# Patient Record
Sex: Male | Born: 1960 | Race: Black or African American | Hispanic: No | Marital: Single | Smoking: Former smoker
Health system: Southern US, Community
[De-identification: ages and names within clinical notes are randomized; demographics above are authoritative.]

## PROBLEM LIST (undated history)

## (undated) DIAGNOSIS — J42 Unspecified chronic bronchitis: Secondary | ICD-10-CM

## (undated) DIAGNOSIS — I10 Essential (primary) hypertension: Secondary | ICD-10-CM

## (undated) DIAGNOSIS — C801 Malignant (primary) neoplasm, unspecified: Secondary | ICD-10-CM

---

## 2000-08-07 ENCOUNTER — Emergency Department (HOSPITAL_COMMUNITY): Admission: EM | Admit: 2000-08-07 | Discharge: 2000-08-07 | Payer: Self-pay | Admitting: Emergency Medicine

## 2000-08-07 ENCOUNTER — Encounter: Payer: Self-pay | Admitting: Emergency Medicine

## 2000-11-12 ENCOUNTER — Encounter: Payer: Self-pay | Admitting: Emergency Medicine

## 2000-11-12 ENCOUNTER — Emergency Department (HOSPITAL_COMMUNITY): Admission: EM | Admit: 2000-11-12 | Discharge: 2000-11-12 | Payer: Self-pay | Admitting: Emergency Medicine

## 2003-02-23 ENCOUNTER — Emergency Department (HOSPITAL_COMMUNITY): Admission: EM | Admit: 2003-02-23 | Discharge: 2003-02-23 | Payer: Self-pay | Admitting: *Deleted

## 2003-08-02 ENCOUNTER — Emergency Department (HOSPITAL_COMMUNITY): Admission: EM | Admit: 2003-08-02 | Discharge: 2003-08-02 | Payer: Self-pay | Admitting: Emergency Medicine

## 2003-08-09 ENCOUNTER — Emergency Department (HOSPITAL_COMMUNITY): Admission: EM | Admit: 2003-08-09 | Discharge: 2003-08-09 | Payer: Self-pay | Admitting: Emergency Medicine

## 2005-02-05 ENCOUNTER — Emergency Department (HOSPITAL_COMMUNITY): Admission: EM | Admit: 2005-02-05 | Discharge: 2005-02-06 | Payer: Self-pay | Admitting: Emergency Medicine

## 2006-08-11 ENCOUNTER — Emergency Department (HOSPITAL_COMMUNITY): Admission: EM | Admit: 2006-08-11 | Discharge: 2006-08-11 | Payer: Self-pay | Admitting: Emergency Medicine

## 2006-08-16 ENCOUNTER — Emergency Department (HOSPITAL_COMMUNITY): Admission: EM | Admit: 2006-08-16 | Discharge: 2006-08-17 | Payer: Self-pay | Admitting: Emergency Medicine

## 2006-08-17 ENCOUNTER — Emergency Department (HOSPITAL_COMMUNITY): Admission: EM | Admit: 2006-08-17 | Discharge: 2006-08-17 | Payer: Self-pay | Admitting: Emergency Medicine

## 2008-05-20 ENCOUNTER — Emergency Department (HOSPITAL_COMMUNITY): Admission: EM | Admit: 2008-05-20 | Discharge: 2008-05-21 | Payer: Self-pay | Admitting: Emergency Medicine

## 2008-07-22 ENCOUNTER — Emergency Department (HOSPITAL_COMMUNITY): Admission: EM | Admit: 2008-07-22 | Discharge: 2008-07-22 | Payer: Self-pay | Admitting: Emergency Medicine

## 2009-10-19 ENCOUNTER — Emergency Department (HOSPITAL_COMMUNITY): Admission: EM | Admit: 2009-10-19 | Discharge: 2009-10-19 | Payer: Self-pay | Admitting: Emergency Medicine

## 2009-11-04 ENCOUNTER — Emergency Department (HOSPITAL_COMMUNITY): Admission: EM | Admit: 2009-11-04 | Discharge: 2009-11-04 | Payer: Self-pay | Admitting: Emergency Medicine

## 2010-05-08 ENCOUNTER — Emergency Department (HOSPITAL_COMMUNITY): Admission: EM | Admit: 2010-05-08 | Discharge: 2010-05-08 | Payer: Self-pay | Admitting: Emergency Medicine

## 2010-08-12 ENCOUNTER — Emergency Department (HOSPITAL_COMMUNITY): Admission: EM | Admit: 2010-08-12 | Discharge: 2010-08-12 | Payer: Self-pay | Admitting: Emergency Medicine

## 2011-03-11 LAB — URINALYSIS, ROUTINE W REFLEX MICROSCOPIC
Glucose, UA: NEGATIVE mg/dL
Ketones, ur: NEGATIVE mg/dL
Nitrite: NEGATIVE
Protein, ur: NEGATIVE mg/dL
pH: 5.5 (ref 5.0–8.0)

## 2011-07-03 ENCOUNTER — Emergency Department (HOSPITAL_COMMUNITY)
Admission: EM | Admit: 2011-07-03 | Discharge: 2011-07-03 | Disposition: A | Discharge status: Home / Self Care | Payer: Medicaid Other | Attending: Emergency Medicine | Admitting: Emergency Medicine

## 2011-07-03 DIAGNOSIS — F3289 Other specified depressive episodes: Secondary | ICD-10-CM | POA: Insufficient documentation

## 2011-07-03 DIAGNOSIS — F329 Major depressive disorder, single episode, unspecified: Secondary | ICD-10-CM | POA: Insufficient documentation

## 2011-07-03 DIAGNOSIS — B86 Scabies: Secondary | ICD-10-CM | POA: Insufficient documentation

## 2013-02-10 ENCOUNTER — Emergency Department (HOSPITAL_COMMUNITY)
Admission: EM | Admit: 2013-02-10 | Discharge: 2013-02-10 | Disposition: A | Discharge status: Home / Self Care | Payer: Medicaid Other | Attending: Emergency Medicine | Admitting: Emergency Medicine

## 2013-02-10 ENCOUNTER — Emergency Department (HOSPITAL_COMMUNITY): Payer: Medicaid Other

## 2013-02-10 ENCOUNTER — Encounter (HOSPITAL_COMMUNITY): Payer: Self-pay | Admitting: Emergency Medicine

## 2013-02-10 DIAGNOSIS — J209 Acute bronchitis, unspecified: Secondary | ICD-10-CM | POA: Insufficient documentation

## 2013-02-10 DIAGNOSIS — R6883 Chills (without fever): Secondary | ICD-10-CM | POA: Insufficient documentation

## 2013-02-10 MED ORDER — ALBUTEROL SULFATE HFA 108 (90 BASE) MCG/ACT IN AERS: 2.0000 | INHALATION_SPRAY | Freq: Four times a day (QID) | RESPIRATORY_TRACT | Status: DC | PRN

## 2013-02-10 MED ORDER — AZITHROMYCIN 250 MG PO TABS: 250.0000 mg | ORAL_TABLET | Freq: Every day | ORAL | Status: DC

## 2013-02-10 NOTE — ED Notes (Signed)
Per EMS: pt from home c/o fever with aches and chills x 3 days; pt sts pain with inspiration and cough

## 2013-02-10 NOTE — ED Provider Notes (Signed)
History     CSN: [id number]  Arrival date & time 02/10/13  1035   First MD Initiated Contact with Patient 02/10/13 1047      Chief Complaint  Patient presents with  . Chills  . Cough    (Consider location/radiation/quality/duration/timing/severity/associated sxs/prior treatment) HPI Comments: Patient presents today with a chief complaint of productive cough and chills for the past 3 days.  He thinks that he may have had a fever, but has not checked his temperature.  He denies SOB.  He does not have pain with inspiration, but does have some left lower ribcage pain when he coughs very hard.  He denies hemoptysis.  Denies any history of DVT, PE, or Cancer.  He denies prolonged travel or surgeries in the past 4 weeks.  He currently smokes occasionally.  No known history of COPD.    Patient is a 52 y.o. male presenting with cough. The history is provided by the patient.  Cough Cough characteristics:  Productive Sputum characteristics:  Yellow Progression:  Worsening Smoker: yes   Relieved by:  None tried Ineffective treatments:  None tried Associated symptoms: chills   Associated symptoms: no chest pain, no ear fullness, no ear pain, no myalgias, no shortness of breath, no sinus congestion, no sore throat and no wheezing     History reviewed. No pertinent past medical history.  History reviewed. No pertinent past surgical history.  History reviewed. No pertinent family history.  History  Substance Use Topics  . Smoking status: Not on file  . Smokeless tobacco: Not on file  . Alcohol Use: Not on file      Review of Systems  Constitutional: Positive for chills.       Subjective fever  HENT: Negative for ear pain and sore throat.   Respiratory: Positive for cough. Negative for chest tightness, shortness of breath and wheezing.   Cardiovascular: Negative for chest pain, palpitations and leg swelling.  Musculoskeletal: Negative for myalgias.  All other systems reviewed and  are negative.    Allergies  Review of patient's allergies indicates no known allergies.  Home Medications  No current outpatient prescriptions on file.  BP 130/87  Pulse 93  Temp(Src) 98.2 F (36.8 C) (Oral)  Resp 16  SpO2 98%  Physical Exam  Nursing note and vitals reviewed. Constitutional: He appears well-developed and well-nourished. No distress.  HENT:  Head: Normocephalic and atraumatic.  Right Ear: Tympanic membrane and ear canal normal.  Left Ear: Tympanic membrane and ear canal normal.  Nose: Mucosal edema present. Right sinus exhibits no maxillary sinus tenderness and no frontal sinus tenderness. Left sinus exhibits no maxillary sinus tenderness and no frontal sinus tenderness.  Mouth/Throat: Uvula is midline, oropharynx is clear and moist and mucous membranes are normal.  Neck: Normal range of motion. Neck supple.  Cardiovascular: Normal rate, regular rhythm and normal heart sounds.   Pulmonary/Chest: Effort normal and breath sounds normal. No respiratory distress. He has no wheezes. He has no rales.  Neurological: He is alert.  Skin: Skin is warm and dry. He is not diaphoretic. No erythema.  Psychiatric: He has a normal mood and affect.    ED Course  Procedures (including critical care time)  Labs Reviewed - No data to display Dg Chest 2 View  02/10/2013  *RADIOLOGY REPORT*  Clinical Data: Cough, chills  CHEST - 2 VIEW  Comparison: Chest x-ray of 10/19/2009  Findings: No active infiltrate or effusion is seen.  There is some peribronchial thickening which may indicate  bronchitis, possibly chronic.  The heart is within upper limits of normal and stable. There are degenerative changes in the lower thoracic spine.  IMPRESSION: No pneumonia.  Question bronchitis.   Original Report Authenticated By: Dwyane Dee, M.D.      No diagnosis found.    MDM  Patient presenting with chills and productive cough.  CXR showing no evidence of Pneumonia, but possible Bronchitis.   Patient does smoke occasionally.  Patient is not hypoxic.  Therefore, feel that the patient can be discharged home.  Return precautions discussed.        Pascal Lux [place], PA-C 02/10/13 [phone]

## 2013-02-10 NOTE — ED Provider Notes (Signed)
Medical screening examination/treatment/procedure(s) were performed by non-physician practitioner and as supervising physician I was immediately available for consultation/collaboration.  Derwood Kaplan, MD 02/10/13 1807

## 2013-02-10 NOTE — ED Notes (Signed)
Patient transported to X-ray 

## 2013-08-02 ENCOUNTER — Encounter (HOSPITAL_COMMUNITY): Payer: Self-pay

## 2013-08-02 ENCOUNTER — Emergency Department (HOSPITAL_COMMUNITY)
Admission: EM | Admit: 2013-08-02 | Discharge: 2013-08-02 | Disposition: A | Discharge status: Home / Self Care | Payer: Medicaid Other | Attending: Emergency Medicine | Admitting: Emergency Medicine

## 2013-08-02 DIAGNOSIS — T63461A Toxic effect of venom of wasps, accidental (unintentional), initial encounter: Secondary | ICD-10-CM | POA: Insufficient documentation

## 2013-08-02 DIAGNOSIS — T6391XA Toxic effect of contact with unspecified venomous animal, accidental (unintentional), initial encounter: Secondary | ICD-10-CM | POA: Insufficient documentation

## 2013-08-02 DIAGNOSIS — W57XXXA Bitten or stung by nonvenomous insect and other nonvenomous arthropods, initial encounter: Secondary | ICD-10-CM

## 2013-08-02 DIAGNOSIS — F17200 Nicotine dependence, unspecified, uncomplicated: Secondary | ICD-10-CM | POA: Insufficient documentation

## 2013-08-02 DIAGNOSIS — Z8709 Personal history of other diseases of the respiratory system: Secondary | ICD-10-CM | POA: Insufficient documentation

## 2013-08-02 DIAGNOSIS — Y929 Unspecified place or not applicable: Secondary | ICD-10-CM | POA: Insufficient documentation

## 2013-08-02 DIAGNOSIS — Y9389 Activity, other specified: Secondary | ICD-10-CM | POA: Insufficient documentation

## 2013-08-02 HISTORY — DX: Unspecified chronic bronchitis: J42

## 2013-08-02 MED ORDER — CEPHALEXIN 500 MG PO CAPS: 500.0000 mg | ORAL_CAPSULE | Freq: Four times a day (QID) | ORAL | Status: DC

## 2013-08-02 NOTE — ED Provider Notes (Signed)
CSN: [id number]     Arrival date & time 08/02/13  1652 History     First MD Initiated Contact with Patient 08/02/13 1945     Chief Complaint  Patient presents with  . Insect Bite   (Consider location/radiation/quality/duration/timing/severity/associated sxs/prior Treatment) HPI Comments: Patient brought in today by EMS due to swelling of his right hand.  Patient reports that he was stung by a bee three days ago.  He states that he actually saw the bee and felt the bee sting him.  He reports that today he started noticing swelling of his right hand.  Hand is also painful.  He was given Benadryl IV by EMS en route to the ED, but does not feel that there is any improvement in swelling at this time.  He denies numbness or tingling.  He denies fever or chills.  Denies swelling of the lips, tongue, or throat.  Denies shortness of breath.  He denies history of DM.    The history is provided by the patient.    Past Medical History  Diagnosis Date  . Bronchitis, chronic    History reviewed. No pertinent past surgical history. No family history on file. History  Substance Use Topics  . Smoking status: Current Some Day Smoker  . Smokeless tobacco: Not on file  . Alcohol Use: Yes    Review of Systems  Musculoskeletal:       Pain and swelling of the right hand  All other systems reviewed and are negative.    Allergies  Review of patient's allergies indicates no known allergies.  Home Medications   Current Outpatient Rx  Name  Route  Sig  Dispense  Refill  . albuterol (PROVENTIL HFA;VENTOLIN HFA) 108 (90 BASE) MCG/ACT inhaler   Inhalation   Inhale 2 puffs into the lungs every 6 (six) hours as needed for wheezing.   1 Inhaler   2    BP 161/110  Pulse 86  Temp(Src) 98.7 F (37.1 C) (Oral)  Resp 16  SpO2 97% Physical Exam  Nursing note and vitals reviewed. Constitutional: He appears well-developed and well-nourished. No distress.  HENT:  Head: Normocephalic and atraumatic.   Cardiovascular: Normal rate, regular rhythm and normal heart sounds.   Pulses:      Radial pulses are 2+ on the right side, and 2+ on the left side.  Pulmonary/Chest: Effort normal and breath sounds normal.  Musculoskeletal:  Swelling of the dorsal aspect of the right hand.  No erythema visualized.  No warmth of the hand.  Neurological: He is alert.  Distal sensation of the right hand intact  Skin: Skin is warm and dry. He is not diaphoretic.  No abscess present  Psychiatric: He has a normal mood and affect.    ED Course   Procedures (including critical care time)  Labs Reviewed - No data to display No results found. No diagnosis found.  Patient also evaluated by Dr. Denton Lank. MDM  Patient presenting with swelling of his right hand.  He was stung by a bee three days ago.  On exam obvious swelling of the right hand.  No obvious erythema or warmth noted.  It is possible that the patient is having a delayed reaction to the bee sting.  Patient instructed to continue to use Benadryl.  Patient also started on Keflex to treat for possible Cellulitis.  Patient instructed to follow up for recheck in 48 hours.  Return precautions given.  Patient also evaluated by Dr. Denton Lank.  Pascal Lux [place],  PA-C 08/03/13 1516

## 2013-08-02 NOTE — ED Notes (Signed)
Per EMS pt c/o bee sting to rt hand 3 days ago, now has swelling; benadryl 50mg  IV given enroute, denies LOC

## 2013-08-03 NOTE — ED Provider Notes (Signed)
Medical screening examination/treatment/procedure(s) were conducted as a shared visit with non-physician practitioner(s) and myself.  I personally evaluated the patient during the encounter Pt c/o insect bite to dorsum hand 3 days ago. Now swollen and mildly painful. ?early cellulitis on exam.   Suzi Roots, MD 08/03/13 2132

## 2015-07-15 ENCOUNTER — Emergency Department (HOSPITAL_COMMUNITY)
Admission: EM | Admit: 2015-07-15 | Discharge: 2015-07-15 | Disposition: A | Discharge status: Home / Self Care | Payer: Medicaid Other | Attending: Emergency Medicine | Admitting: Emergency Medicine

## 2015-07-15 ENCOUNTER — Emergency Department (HOSPITAL_COMMUNITY): Payer: Medicaid Other

## 2015-07-15 ENCOUNTER — Encounter (HOSPITAL_COMMUNITY): Payer: Self-pay | Admitting: Physical Medicine and Rehabilitation

## 2015-07-15 DIAGNOSIS — R55 Syncope and collapse: Secondary | ICD-10-CM | POA: Insufficient documentation

## 2015-07-15 DIAGNOSIS — I10 Essential (primary) hypertension: Secondary | ICD-10-CM | POA: Insufficient documentation

## 2015-07-15 DIAGNOSIS — Z79899 Other long term (current) drug therapy: Secondary | ICD-10-CM | POA: Insufficient documentation

## 2015-07-15 DIAGNOSIS — Z8709 Personal history of other diseases of the respiratory system: Secondary | ICD-10-CM | POA: Diagnosis not present

## 2015-07-15 DIAGNOSIS — Z720 Tobacco use: Secondary | ICD-10-CM | POA: Diagnosis not present

## 2015-07-15 HISTORY — DX: Essential (primary) hypertension: I10

## 2015-07-15 LAB — COMPREHENSIVE METABOLIC PANEL
ALK PHOS: 89 U/L (ref 38–126)
ALT: 42 U/L (ref 17–63)
ANION GAP: 10 (ref 5–15)
AST: 49 U/L — ABNORMAL HIGH (ref 15–41)
Albumin: 4 g/dL (ref 3.5–5.0)
BILIRUBIN TOTAL: 0.9 mg/dL (ref 0.3–1.2)
BUN: 7 mg/dL (ref 6–20)
CHLORIDE: 103 mmol/L (ref 101–111)
CO2: 22 mmol/L (ref 22–32)
CREATININE: 0.8 mg/dL (ref 0.61–1.24)
Calcium: 8.6 mg/dL — ABNORMAL LOW (ref 8.9–10.3)
GLUCOSE: 85 mg/dL (ref 65–99)
POTASSIUM: 3.4 mmol/L — AB (ref 3.5–5.1)
Sodium: 135 mmol/L (ref 135–145)
Total Protein: 7.2 g/dL (ref 6.5–8.1)

## 2015-07-15 LAB — I-STAT CG4 LACTIC ACID, ED: LACTIC ACID, VENOUS: 1.8 mmol/L (ref 0.5–2.0)

## 2015-07-15 LAB — CBC WITH DIFFERENTIAL/PLATELET
BASOS ABS: 0 10*3/uL (ref 0.0–0.1)
Basophils Relative: 0 % (ref 0–1)
Eosinophils Absolute: 0.3 10*3/uL (ref 0.0–0.7)
Eosinophils Relative: 4 % (ref 0–5)
HEMATOCRIT: 36.8 % — AB (ref 39.0–52.0)
HEMOGLOBIN: 12.2 g/dL — AB (ref 13.0–17.0)
LYMPHS PCT: 36 % (ref 12–46)
Lymphs Abs: 2.7 10*3/uL (ref 0.7–4.0)
MCH: 30.6 pg (ref 26.0–34.0)
MCHC: 33.2 g/dL (ref 30.0–36.0)
MCV: 92.2 fL (ref 78.0–100.0)
MONO ABS: 0.5 10*3/uL (ref 0.1–1.0)
Monocytes Relative: 7 % (ref 3–12)
NEUTROS ABS: 4 10*3/uL (ref 1.7–7.7)
NEUTROS PCT: 53 % (ref 43–77)
Platelets: 209 10*3/uL (ref 150–400)
RBC: 3.99 MIL/uL — ABNORMAL LOW (ref 4.22–5.81)
RDW: 12.9 % (ref 11.5–15.5)
WBC: 7.5 10*3/uL (ref 4.0–10.5)

## 2015-07-15 LAB — I-STAT TROPONIN, ED: TROPONIN I, POC: 0 ng/mL (ref 0.00–0.08)

## 2015-07-15 LAB — CBG MONITORING, ED: Glucose-Capillary: 92 mg/dL (ref 65–99)
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
## 2015-07-15 NOTE — ED Provider Notes (Addendum)
CSN: [id number]     Arrival date & time 07/15/15  1057 History   First MD Initiated Contact with Patient 07/15/15 1109     Chief Complaint  Patient presents with  . Loss of Consciousness     (Consider location/radiation/quality/duration/timing/severity/associated sxs/prior Treatment) Patient is a 54 y.o. male presenting with syncope. The history is provided by the patient.  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 His vision went black he fell to his knee and then it lost consciousness for a very short time Witnessed: yes   Relieved by:  None tried Worsened by:  Nothing tried Ineffective treatments:  None tried Associated symptoms: no anxiety, no chest pain, no confusion, no diaphoresis, no difficulty breathing, no dizziness, no focal weakness, no headaches, no malaise/fatigue, no nausea, no palpitations, no shortness of breath, no vomiting and no weakness   Risk factors comment:  States he drinks a beer about every hour. Occasional tobacco use but denies any drug use. No heart history but does have a history of hypertension. Not currently taking medication.   Past Medical History  Diagnosis Date  . Bronchitis, chronic   . Hypertension    History reviewed. No pertinent past surgical history. History reviewed. No pertinent family history. History  Substance Use Topics  . Smoking status: Current Some Day Smoker    Types: Cigarettes  . Smokeless tobacco: Not on file  . Alcohol Use: Yes    Review of Systems  Constitutional: Negative for malaise/fatigue and diaphoresis.  Respiratory: Negative for shortness of breath.   Cardiovascular: Positive for syncope. Negative for chest pain and palpitations.  Gastrointestinal: Negative for nausea and vomiting.   Neurological: Negative for dizziness, focal weakness, weakness and headaches.  Psychiatric/Behavioral: Negative for confusion.  All other systems reviewed and are negative.     Allergies  Review of patient's allergies indicates no known allergies.  Home Medications   Prior to Admission medications   Medication Sig Start Date End Date Taking? Authorizing Provider  albuterol (PROVENTIL HFA;VENTOLIN HFA) 108 (90 BASE) MCG/ACT inhaler Inhale 2 puffs into the lungs every 6 (six) hours as needed for wheezing. 02/10/13   Heather Laisure, PA-C  cephALEXin (KEFLEX) 500 MG capsule Take 1 capsule (500 mg total) by mouth 4 (four) times daily. 08/02/13   Heather Laisure, PA-C   BP 114/78 mmHg  Pulse 85  Temp(Src) 98.1 F (36.7 C) (Oral)  Resp 20  SpO2 99% Physical Exam  Constitutional: He is oriented to person, place, and time. He appears well-developed and well-nourished. No distress.  HENT:  Head: Normocephalic and atraumatic.  Mouth/Throat: Oropharynx is clear and moist.  Eyes: Conjunctivae and EOM are normal. Pupils are equal, round, and reactive to light.  Neck: Normal range of motion. Neck supple.  Cardiovascular: Normal rate, regular rhythm and intact distal pulses.   No murmur heard. Pulmonary/Chest: Effort normal and breath sounds normal. No respiratory distress. He has no wheezes. He has no rales.  Abdominal: Soft. He exhibits no distension. There is no tenderness. There is no rebound and no guarding.  Musculoskeletal: Normal range of motion. He exhibits no edema or tenderness.  Neurological: He is alert and oriented to person, place, and time.  Skin: Skin is warm and dry. No rash noted. No erythema.  Psychiatric: He has  a normal mood and affect. His behavior is normal.  Nursing note and vitals reviewed.   ED Course  Procedures (including critical care time) Labs Review Labs Reviewed  CBC WITH DIFFERENTIAL/PLATELET - Abnormal; Notable for the following:    RBC 3.99 (*)     Hemoglobin 12.2 (*)    HCT 36.8 (*)    All other components within normal limits  COMPREHENSIVE METABOLIC PANEL - Abnormal; Notable for the following:    Potassium 3.4 (*)    Calcium 8.6 (*)    AST 49 (*)    All other components within normal limits  I-STAT TROPOININ, ED  I-STAT CG4 LACTIC ACID, ED  CBG MONITORING, ED    Imaging Review Dg Chest 2 View  07/15/2015   CLINICAL DATA:  Recent syncopal episode  EXAM: CHEST - 2 VIEW  COMPARISON:  02/10/2013  FINDINGS: The heart size and mediastinal contours are within normal limits. Both lungs are clear. The visualized skeletal structures are unremarkable.  IMPRESSION: No acute abnormality noted.   Electronically Signed   By: Inez Catalina M.D.   On: 07/15/2015 13:12     EKG Interpretation   Date/Time:  Saturday July 15 2015 11:19:33 EDT Ventricular Rate:  82 PR Interval:  174 QRS Duration: 96 QT Interval:  400 QTC Calculation: 467 R Axis:   57 Text Interpretation:  Sinus rhythm Ventricular premature complex Consider  left atrial enlargement Abnormal R-wave progression, early transition  Nonspecific T abnrm, anterolateral leads No previous tracing Confirmed by  Maryan Rued  MD, Loree Fee ([id number]) on 07/15/2015 11:23:01 AM      MDM   Final diagnoses:  Syncope, unspecified syncope type    Patient with a history of sudden onset syncope today while outside at work. He did have prodromal symptoms of his vision going dark and knowing he was given a pass out. He denied any palpitations, chest pain or shortness of breath. He normally works outside and states he drinks water regularly also he drinks alcohol regularly. Between a 6 and 12 pack a day and he has been drinking this morning. He currently feels normal he denies any chest pain or shortness of breath.  He denies feeling ill and states that currently he feels his normal self. Patient has had one episode of syncope in the past about 4-5 years ago that was similar in nature. He denies any heart  issues but does admit to a history of hypertension but he is not taking any medication at this time.  Patient has a normal exam and currently has blood pressure is normal at 114/78 with a normal pulse. He has no injury from the syncope and no evidence of head injury. EKG does show early R-wave progression as well as nonspecific T-wave abnormalities without prior to compare.  It could be that patient is dehydrated from working outside as well as having regular alcohol use could also cause dehydration. Lower suspicion for cardiac cause today for his syncope. Also patient's blood pressure is in the low 100s so this could be orthostatic as he had been standing for a while and started to get hot prior to his syncope.  CBC, Chem-8, troponin, chest x-ray pending  1:17 PM Labs and imaging without acute concerns.  Mild AST elevation which is most likely related to his alcohol consumption.  BP and HR wnl. Feel most likely EKG changes are chronic as pt has not had any CP, SOB or sudden syncope concerning for cardiac disease.  Blanchie Dessert, MD 07/15/15 1320  Blanchie Dessert, MD 07/15/15 1355
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
## 2019-02-14 ENCOUNTER — Emergency Department (HOSPITAL_COMMUNITY): Payer: Medicaid Other

## 2019-02-14 ENCOUNTER — Emergency Department (HOSPITAL_COMMUNITY)
Admission: EM | Admit: 2019-02-14 | Discharge: 2019-02-14 | Disposition: A | Discharge status: Home / Self Care | Payer: Medicaid Other | Attending: Emergency Medicine | Admitting: Emergency Medicine

## 2019-02-14 ENCOUNTER — Encounter (HOSPITAL_COMMUNITY): Payer: Self-pay | Admitting: Emergency Medicine

## 2019-02-14 ENCOUNTER — Other Ambulatory Visit: Payer: Self-pay

## 2019-02-14 DIAGNOSIS — M542 Cervicalgia: Secondary | ICD-10-CM | POA: Insufficient documentation

## 2019-02-14 DIAGNOSIS — I10 Essential (primary) hypertension: Secondary | ICD-10-CM | POA: Diagnosis not present

## 2019-02-14 DIAGNOSIS — Z7982 Long term (current) use of aspirin: Secondary | ICD-10-CM | POA: Diagnosis not present

## 2019-02-14 DIAGNOSIS — R918 Other nonspecific abnormal finding of lung field: Secondary | ICD-10-CM | POA: Diagnosis not present

## 2019-02-14 DIAGNOSIS — F17200 Nicotine dependence, unspecified, uncomplicated: Secondary | ICD-10-CM | POA: Insufficient documentation

## 2019-02-14 LAB — BASIC METABOLIC PANEL
ANION GAP: 12 (ref 5–15)
BUN: 9 mg/dL (ref 6–20)
CO2: 26 mmol/L (ref 22–32)
Calcium: 9.3 mg/dL (ref 8.9–10.3)
Chloride: 91 mmol/L — ABNORMAL LOW (ref 98–111)
Creatinine, Ser: 0.76 mg/dL (ref 0.61–1.24)
GFR calc Af Amer: >60 mL/min (ref >60)
GFR calc non Af Amer: >60 mL/min (ref >60)
Glucose, Bld: 102 mg/dL — ABNORMAL HIGH (ref 70–99)
POTASSIUM: 3.7 mmol/L (ref 3.5–5.1)
Sodium: 129 mmol/L — ABNORMAL LOW (ref 135–145)

## 2019-02-14 LAB — CBC
HCT: 34.4 % — ABNORMAL LOW (ref 39.0–52.0)
Hemoglobin: 10.8 g/dL — ABNORMAL LOW (ref 13.0–17.0)
MCH: 29 pg (ref 26.0–34.0)
MCHC: 31.4 g/dL (ref 30.0–36.0)
MCV: 92.5 fL (ref 80.0–100.0)
PLATELETS: 426 10*3/uL — AB (ref 150–400)
RBC: 3.72 MIL/uL — ABNORMAL LOW (ref 4.22–5.81)
RDW: 14 % (ref 11.5–15.5)
WBC: 12.6 10*3/uL — AB (ref 4.0–10.5)
nRBC: 0 % (ref 0.0–0.2)

## 2019-02-14 MED ORDER — TRAMADOL HCL 50 MG PO TABS: 50.0000 mg | ORAL_TABLET | Freq: Four times a day (QID) | ORAL | 0 refills | Status: DC | PRN

## 2019-02-14 MED ORDER — SODIUM CHLORIDE (PF) 0.9 % IJ SOLN
INTRAMUSCULAR | Status: AC
  Filled 2019-02-14: qty 50

## 2019-02-14 MED ORDER — CYCLOBENZAPRINE HCL 10 MG PO TABS: 10.0000 mg | ORAL_TABLET | Freq: Two times a day (BID) | ORAL | 0 refills | Status: DC | PRN

## 2019-02-14 MED ORDER — CYCLOBENZAPRINE HCL 10 MG PO TABS
10.0000 mg | ORAL_TABLET | Freq: Once | ORAL | Status: AC
  Administered 2019-02-14: 10 mg via ORAL
  Filled 2019-02-14: qty 1

## 2019-02-14 MED ORDER — DEXAMETHASONE SODIUM PHOSPHATE 10 MG/ML IJ SOLN
10.0000 mg | Freq: Once | INTRAMUSCULAR | Status: AC
  Administered 2019-02-14: 10 mg via INTRAVENOUS
  Filled 2019-02-14: qty 1

## 2019-02-14 MED ORDER — KETOROLAC TROMETHAMINE 15 MG/ML IJ SOLN
15.0000 mg | Freq: Once | INTRAMUSCULAR | Status: AC
  Administered 2019-02-14: 15 mg via INTRAVENOUS
  Filled 2019-02-14: qty 1

## 2019-02-14 MED ORDER — IOHEXOL 300 MG/ML  SOLN
75.0000 mL | Freq: Once | INTRAMUSCULAR | Status: AC | PRN
  Administered 2019-02-14: 75 mL via INTRAVENOUS

## 2019-02-14 NOTE — ED Provider Notes (Signed)
[facility] DEPT Provider Note   CSN: [id number] Arrival date & time: 02/14/19  1402    History   Chief Complaint Chief Complaint  Patient presents with  . Neck Pain    HPI ENGLISH Willie Schmidt is a 58 y.o. male with hx of chronic bronchitis and every day smoker who presents to the ED with neck pain. Patient reports the pain started a month ago and is sharp on the right side. Patient reports taking ibuprofen without relief. Patient does not recall any injury to the area. The pain increases with movement of the head to the right. Patient also states "while I am here can you treat my cough and congestion?" I ask about the cough and patient reports it has been persistent for over a month and he has taken all sorts of OTC medications without relief.      HPI  Past Medical History:  Diagnosis Date  . Bronchitis, chronic ([place])   . Hypertension     There are no active problems to display for this patient.   History reviewed. No pertinent surgical history.      Home Medications    Prior to Admission medications   Medication Sig Start Date End Date Taking? Authorizing Provider  albuterol (PROVENTIL HFA;VENTOLIN HFA) 108 (90 BASE) MCG/ACT inhaler Inhale 2 puffs into the lungs every 6 (six) hours as needed for wheezing. 02/10/13   Hyman Bible, PA-C  aspirin 81 MG tablet Take 81 mg by mouth daily.    [provider]  cephALEXin (KEFLEX) 500 MG capsule Take 1 capsule (500 mg total) by mouth 4 (four) times daily. Patient not taking: Reported on 07/15/2015 08/02/13   Hyman Bible, PA-C  cyclobenzaprine (FLEXERIL) 10 MG tablet Take 1 tablet (10 mg total) by mouth 2 (two) times daily as needed for muscle spasms. 02/14/19   Ashley Murrain, NP  traMADol (ULTRAM) 50 MG tablet Take 1 tablet (50 mg total) by mouth every 6 (six) hours as needed. 02/14/19   Ashley Murrain, NP    Family History No family history on file.  Social History Social History    Tobacco Use  . Smoking status: Current Some Day Smoker    Types: Cigarettes  Substance Use Topics  . Alcohol use: Yes  . Drug use: No     Allergies   Patient has no known allergies.   Review of Systems Review of Systems  Constitutional: Negative for chills and fever.  HENT: Positive for congestion.   Eyes: Negative for visual disturbance.  Respiratory: Positive for cough and shortness of breath.   Cardiovascular: Negative for chest pain.  Gastrointestinal: Negative for abdominal pain, nausea and vomiting.  Musculoskeletal: Positive for neck pain.  Skin: Negative for wound.  Neurological: Negative for weakness and headaches.  Psychiatric/Behavioral: Negative for confusion.     Physical Exam Updated Vital Signs BP (!) 148/99   Pulse 96   Temp 98.6 F (37 C) (Oral)   Resp 18   SpO2 100%   Physical Exam Vitals signs and nursing note reviewed.  Constitutional:      General: He is not in acute distress.    Appearance: He is well-developed.  HENT:     Head: Normocephalic and atraumatic.     Right Ear: Tympanic membrane normal.     Left Ear: Tympanic membrane normal.     Nose: Nose normal.     Mouth/Throat:     Mouth: Mucous membranes are moist.  Eyes:  Extraocular Movements: Extraocular movements intact.     Conjunctiva/sclera: Conjunctivae normal.  Neck:     Musculoskeletal: Neck supple. Decreased range of motion: due to pain. Pain with movement, torticollis, spinous process tenderness and muscular tenderness present. No neck rigidity.  Cardiovascular:     Rate and Rhythm: Normal rate and regular rhythm.  Pulmonary:     Effort: No respiratory distress.     Breath sounds: Decreased air movement present. Examination of the left-upper field reveals decreased breath sounds. Decreased breath sounds present. Wheezes: occasional.  Musculoskeletal: Normal range of motion.  Skin:    General: Skin is warm and dry.  Neurological:     Mental Status: He is alert and  oriented to person, place, and time.     Cranial Nerves: No cranial nerve deficit.  Psychiatric:        Mood and Affect: Mood normal.      ED Treatments / Results  Labs (all labs ordered are listed, but only abnormal results are displayed) Labs Reviewed  CBC - Abnormal; Notable for the following components:      Result Value   WBC 12.6 (*)    RBC 3.72 (*)    Hemoglobin 10.8 (*)    HCT 34.4 (*)    Platelets 426 (*)    All other components within normal limits  BASIC METABOLIC PANEL - Abnormal; Notable for the following components:   Sodium 129 (*)    Chloride 91 (*)    Glucose, Bld 102 (*)    All other components within normal limits    Radiology Dg Chest 2 View  Result Date: 02/14/2019 CLINICAL DATA:  Right neck and shoulder pain for 1 month. EXAM: CHEST - 2 VIEW COMPARISON:  PA and lateral chest 07/14/2017. FINDINGS: Nodular opacity in left upper lobe measures 3.4 cm in diameter. A more subtle and smaller nodular opacity is seen just above this 3.4 cm focus. Left basilar atelectasis is noted. The right lung is clear. Heart size is normal. IMPRESSION: Left upper lobe nodular opacities could be due to infection but have an appearance worrisome for neoplasm. Recommend chest CT with contrast for further evaluation. Electronically Signed   By: Inge Rise M.D.   On: 02/14/2019 16:55   Dg Cervical Spine Complete  Result Date: 02/14/2019 CLINICAL DATA:  Neck and right shoulder pain for 1 month. EXAM: CERVICAL SPINE - COMPLETE 4+ VIEW COMPARISON:  None. FINDINGS: There is no evidence of cervical spine fracture or prevertebral soft tissue swelling. Alignment is normal with straightening of lordosis noted. No other significant bone abnormalities are identified. Carotid atherosclerosis is seen bilaterally. IMPRESSION: Negative cervical spine radiographs. Carotid atherosclerosis. Electronically Signed   By: Inge Rise M.D.   On: 02/14/2019 16:52   Ct Chest W Contrast  Result  Date: 02/14/2019 CLINICAL DATA:  Nodular left lung opacities on chest radiograph. EXAM: CT CHEST WITH CONTRAST TECHNIQUE: Multidetector CT imaging of the chest was performed during intravenous contrast administration. CONTRAST:  52mL OMNIPAQUE IOHEXOL 300 MG/ML  SOLN COMPARISON:  Chest radiograph from earlier today. FINDINGS: Cardiovascular: Top-normal heart size. Trace pericardial effusion/thickening. Atherosclerotic nonaneurysmal thoracic aorta. Top-normal caliber main pulmonary artery (3.4 cm diameter). No central pulmonary emboli. Mediastinum/Nodes: No discrete thyroid nodules. Unremarkable esophagus. No axillary adenopathy. Enlarged 1.3 cm left supraclavicular node (series 2/image 19). Enlarged 1.3 cm right paratracheal node (series 2/image 40). Enlarged 1.3 cm subcarinal node (series 2/image 64). Infiltrative 6.4 x 4.7 cm soft tissue mass (series 2/image 53) centered in the aortopulmonary window,  contiguous with the upper left hilum, encasing the left main and left upper and left lower bronchi and extending to the carina. No right hilar adenopathy. Lungs/Pleura: No pneumothorax. Trace dependent left pleural effusion. No right pleural effusion. Multiple poorly marginated sub solid left upper lobe lung masses and pulmonary nodules, largest 4.6 x 3.3 cm posteriorly (series 7/image 50). Additional representative 2.4 cm sub solid apical left upper lobe nodule (series 7/image 34). Clustered sub solid right middle lobe nodules, largest 8 mm (series 7/image 87). Subsegmental right lung base atelectasis. Upper abdomen: Posterior left perinephric 0.8 cm soft tissue nodule (series 2/image 139). Musculoskeletal: Faint sclerotic posterior T10 vertebral lesion (series 6/image 89). IMPRESSION: 1. Infiltrative 6.4 x 4.7 cm soft tissue mass centered in the AP window, contiguous with the upper left hilum, narrowing the central left lung airways, extending to the carina. Multiple subsolid lung masses and pulmonary nodules in the  left upper lobe. Findings are most compatible with primary bronchogenic carcinoma, with the appearance favoring small cell lung carcinoma. 2. Left supraclavicular, subcarinal and right paratracheal adenopathy, suspicious for nodal metastatic disease. The left supraclavicular adenopathy may be amenable to ultrasound-guided percutaneous biopsy for diagnostic purposes. 3. Faint sclerotic posterior T10 vertebral lesion, cannot exclude osseous metastasis. 4. Small soft tissue nodule in the posterior left retroperitoneum, can not exclude soft tissue metastasis. 5. Suggest PET-CT for further staging evaluation. Suggest multidisciplinary thoracic oncology consultation. 6. Trace pericardial effusion/thickening. Aortic Atherosclerosis (ICD10-I70.0). Electronically Signed   By: Ilona Sorrel M.D.   On: 02/14/2019 19:15    Procedures Procedures (including critical care time)  Medications Ordered in ED Medications  sodium chloride (PF) 0.9 % injection (has no administration in time range)  cyclobenzaprine (FLEXERIL) tablet 10 mg (10 mg Oral Given 02/14/19 1616)  ketorolac (TORADOL) 15 MG/ML injection 15 mg (15 mg Intravenous Given 02/14/19 1751)  dexamethasone (DECADRON) injection 10 mg (10 mg Intravenous Given 02/14/19 1751)  iohexol (OMNIPAQUE) 300 MG/ML solution 75 mL (75 mLs Intravenous Contrast Given 02/14/19 1830)     Initial Impression / Assessment and Plan / ED Course  I have reviewed the triage vital signs and the nursing notes. Dr. Eulis Foster in with me to discuss CT results with the patient and his wife. Will make referral to the [facility] for further evaluation.  Final Clinical Impressions(s) / ED Diagnoses  58 y.o. male here with muscle spasm of the right side of his neck and a persistent cough stable for d/c. Will treat for muscle spasm and patient to f/u with the Cancer center. He will call the office tomorrow for an appointment. Patient understands that the lung mass is most likely cancer.  Final  diagnoses:  Lung mass  Neck pain    ED Discharge Orders         Ordered    cyclobenzaprine (FLEXERIL) 10 MG tablet  2 times daily PRN     02/14/19 2010    traMADol (ULTRAM) 50 MG tablet  Every 6 hours PRN     02/14/19 2010           Debroah Baller [place], [place] 02/14/19 2017    Carmin Muskrat, MD 02/18/19 2133

## 2019-02-14 NOTE — ED Provider Notes (Signed)
  Face-to-face evaluation   History: Patient presents for evaluation of head and right shoulder pain.  Symptoms ongoing for 1 month.  No known injury.  Physical exam: Alert, calm, cooperative.  No dysarthria or aphasia.  No respiratory distress.  Normal motion arms and legs bilaterally.  Discussed the imaging results with the patient after reviewing the films.  He will be referred to oncology and treated symptomatically.   ED testing reveals abnormal chest x-ray, requiring CT imaging.  CT neck done does not show source for discomfort.  CT chest is follow-up chest x-ray indicates primary lung cancer with possible metastatic adenopathy.  Medical screening examination/treatment/procedure(s) were conducted as a shared visit with non-physician practitioner(s) and myself.  I personally evaluated the patient during the encounter    Daleen Bo, MD 02/14/19 2334

## 2019-02-14 NOTE — Discharge Instructions (Addendum)
You can take ibuprofen in addition to the medications we have prescribed for you. Call Dr. [facility] Junker office in the morning and tell them you were seen here today and have several lung masses and need an appointment.

## 2019-02-14 NOTE — ED Triage Notes (Addendum)
Pt c/o R neck pain from base of head to R shoulder x 1 month, sharp and needle like pain. Pt states he has been taking ibuprofen for pain without relief. Cannot recall any injury to neck / shoulder. Pt reports pain worsens when he rotates head to the right.

## 2019-02-15 ENCOUNTER — Telehealth: Payer: Self-pay | Admitting: Internal Medicine

## 2019-02-15 NOTE — Telephone Encounter (Signed)
Mr. Willie Schmidt has been cld and scheduled to see Dr. Julien Nordmann on 2/26 at 130pm w/labs at 1pm. Aware to arrive 30 minutes early.

## 2019-02-16 ENCOUNTER — Other Ambulatory Visit: Payer: Self-pay | Admitting: Medical Oncology

## 2019-02-16 DIAGNOSIS — R599 Enlarged lymph nodes, unspecified: Secondary | ICD-10-CM

## 2019-02-17 ENCOUNTER — Inpatient Hospital Stay: Payer: Medicaid Other | Attending: Internal Medicine | Admitting: Internal Medicine

## 2019-02-17 ENCOUNTER — Inpatient Hospital Stay: Payer: Medicaid Other

## 2019-02-17 DIAGNOSIS — R918 Other nonspecific abnormal finding of lung field: Secondary | ICD-10-CM | POA: Diagnosis present

## 2019-02-17 DIAGNOSIS — R599 Enlarged lymph nodes, unspecified: Secondary | ICD-10-CM

## 2019-02-17 DIAGNOSIS — M542 Cervicalgia: Secondary | ICD-10-CM | POA: Insufficient documentation

## 2019-02-17 DIAGNOSIS — C3490 Malignant neoplasm of unspecified part of unspecified bronchus or lung: Secondary | ICD-10-CM

## 2019-02-17 DIAGNOSIS — R911 Solitary pulmonary nodule: Secondary | ICD-10-CM

## 2019-02-17 DIAGNOSIS — R590 Localized enlarged lymph nodes: Secondary | ICD-10-CM | POA: Diagnosis not present

## 2019-02-17 LAB — CBC WITH DIFFERENTIAL (CANCER CENTER ONLY)
Abs Immature Granulocytes: 0.03 10*3/uL (ref 0.00–0.07)
Basophils Absolute: 0.1 10*3/uL (ref 0.0–0.1)
Basophils Relative: 0 %
Eosinophils Absolute: 0.4 10*3/uL (ref 0.0–0.5)
Eosinophils Relative: 3 %
HCT: 36.9 % — ABNORMAL LOW (ref 39.0–52.0)
HEMOGLOBIN: 11.9 g/dL — AB (ref 13.0–17.0)
Immature Granulocytes: 0 %
LYMPHS ABS: 3.3 10*3/uL (ref 0.7–4.0)
Lymphocytes Relative: 27 %
MCH: 28.9 pg (ref 26.0–34.0)
MCHC: 32.2 g/dL (ref 30.0–36.0)
MCV: 89.6 fL (ref 80.0–100.0)
Monocytes Absolute: 1.3 10*3/uL — ABNORMAL HIGH (ref 0.1–1.0)
Monocytes Relative: 11 %
Neutro Abs: 7.3 10*3/uL (ref 1.7–7.7)
Neutrophils Relative %: 59 %
Platelet Count: 511 10*3/uL — ABNORMAL HIGH (ref 150–400)
RBC: 4.12 MIL/uL — ABNORMAL LOW (ref 4.22–5.81)
RDW: 14 % (ref 11.5–15.5)
WBC Count: 12.4 10*3/uL — ABNORMAL HIGH (ref 4.0–10.5)
nRBC: 0 % (ref 0.0–0.2)

## 2019-02-17 LAB — CMP (CANCER CENTER ONLY)
ALBUMIN: 3.8 g/dL (ref 3.5–5.0)
ALT: 21 U/L (ref 0–44)
AST: 22 U/L (ref 15–41)
Alkaline Phosphatase: 143 U/L — ABNORMAL HIGH (ref 38–126)
Anion gap: 11 (ref 5–15)
BUN: 7 mg/dL (ref 6–20)
CO2: 28 mmol/L (ref 22–32)
Calcium: 10.3 mg/dL (ref 8.9–10.3)
Chloride: 95 mmol/L — ABNORMAL LOW (ref 98–111)
Creatinine: 0.76 mg/dL (ref 0.61–1.24)
GFR, Est AFR Am: >60 mL/min (ref >60)
GFR, Estimated: >60 mL/min (ref >60)
Glucose, Bld: 107 mg/dL — ABNORMAL HIGH (ref 70–99)
Potassium: 4.5 mmol/L (ref 3.5–5.1)
Sodium: 134 mmol/L — ABNORMAL LOW (ref 135–145)
Total Bilirubin: 0.5 mg/dL (ref 0.3–1.2)
Total Protein: 8.6 g/dL — ABNORMAL HIGH (ref 6.5–8.1)

## 2019-02-17 NOTE — Progress Notes (Signed)
[facility] [phone]   [phone]  CONSULT NOTE  REFERRING PHYSICIAN: Daleen Bo, MD  REASON FOR CONSULTATION:  58 years old African-American male with suspicious lung cancer.  HPI Willie Schmidt is a 58 y.o. male with past medical history significant for hypertension and bronchitis as well as schizophrenia.  The patient has no significant smoking history.  He has been complaining with pain on the right neck and shoulder for more than a months.  He was seen by his primary care physician and has a steroid injection in the right shoulder with no improvement.  He presented to the emergency department on February 14, 2019 for evaluation and recommendation regarding his condition.  During his evaluation he had chest x-ray which showed left upper lobe nodular opacity suspicious for infection or neoplasm.  X-ray of the cervical spine was negative for cervical spine abnormalities.  The patient had CT scan of the chest with contrast on 02/14/2019 and that showed multiple poorly marginated sub-solid left upper lobe lung masses and pulmonary nodules the largest measured 4.6 x 3.3 cm posteriorly.  There was additional representative 2.4 cm sub-solid apical left upper lobe nodule and clusters of sub-solid right middle lobe nodules the largest measured 0.8 cm.  The scan also showed enlarged 1.3 cm left supraclavicular node, enlarged 1.3 cm right paratracheal node, enlarged 1.3 cm subcarinal node, infiltrative 6.4 x 4.7 cm soft tissue mass centered in the AP window contagious with the upper left hilum encasing the left main and left upper lobe and left lower bronchi and extending to the carina.  The patient was referred to me today for further evaluation and recommendation regarding his condition.  When seen today he continues to complain of pain on the right shoulder and neck area.  He is currently on tramadol and ibuprofen on as-needed basis.  He denied having any chest pain  but continues to have shortness of breath and cough with blood-tinged sputum.  He denied having any nausea, vomiting, diarrhea or constipation.  He has intermittent headache.  He also lost around 30 pounds in the last 2 months. Family history significant for mother with kidney cancer, father had cancer but of unknown type, sister had ovarian cancer and there are several family members with breast cancer. The patient is single and has 1 daughter Willie Schmidt who accompanied him to the visit today.  He was also accompanied by his significant other Willie Schmidt.  The patient is currently on disability and he used to work in Biomedical [employer].  He has no significant history of smoking but drinks beer at regular basis with no history of drug abuse. HPI  Past Medical History:  Diagnosis Date  . Bronchitis, chronic ([place])   . Hypertension     No past surgical history on file.  No family history on file.  Social History Social History   Tobacco Use  . Smoking status: Current Some Day Smoker    Types: Cigarettes  Substance Use Topics  . Alcohol use: Yes  . Drug use: No    No Known Allergies  Current Outpatient Medications  Medication Sig Dispense Refill  . albuterol (PROVENTIL HFA;VENTOLIN HFA) 108 (90 BASE) MCG/ACT inhaler Inhale 2 puffs into the lungs every 6 (six) hours as needed for wheezing. 1 Inhaler 2  . aspirin 81 MG tablet Take 81 mg by mouth daily.    . cephALEXin (KEFLEX) 500 MG capsule Take 1 capsule (500 mg total) by mouth 4 (four) times daily. (Patient  not taking: Reported on 07/15/2015) 28 capsule 0  . cyclobenzaprine (FLEXERIL) 10 MG tablet Take 1 tablet (10 mg total) by mouth 2 (two) times daily as needed for muscle spasms. 20 tablet 0  . traMADol (ULTRAM) 50 MG tablet Take 1 tablet (50 mg total) by mouth every 6 (six) hours as needed. 15 tablet 0   No current facility-administered medications for this visit.     Review of Systems  Constitutional: positive for anorexia, fatigue and  weight loss Eyes: negative Ears, nose, mouth, throat, and face: negative Respiratory: positive for cough, dyspnea on exertion and pleurisy/chest pain Cardiovascular: negative Gastrointestinal: negative Genitourinary:negative Integument/breast: negative Hematologic/lymphatic: negative Musculoskeletal:positive for bone pain and neck pain Neurological: negative Behavioral/Psych: negative Endocrine: negative Allergic/Immunologic: negative  Physical Exam  QIW:LNLGX, healthy, no distress, well nourished, well developed and anxious SKIN: skin color, texture, turgor are normal, no rashes or significant lesions HEAD: Normocephalic, No masses, lesions, tenderness or abnormalities EYES: normal, PERRLA, Conjunctiva are pink and non-injected EARS: External ears normal, Canals clear OROPHARYNX:no exudate, no erythema and lips, buccal mucosa, and tongue normal  NECK: supple, no adenopathy, no JVD LYMPH:  no palpable lymphadenopathy, no hepatosplenomegaly LUNGS: clear to auscultation , and palpation HEART: regular rate & rhythm, no murmurs and no gallops ABDOMEN:abdomen soft, non-tender, normal bowel sounds and no masses or organomegaly BACK: No CVA tenderness, Range of motion is normal EXTREMITIES:no joint deformities, effusion, or inflammation, no edema  NEURO: alert & oriented x 3 with fluent speech, no focal motor/sensory deficits  PERFORMANCE STATUS: ECOG 1  LABORATORY DATA: Lab Results  Component Value Date   WBC 12.4 (H) 02/17/2019   HGB 11.9 (L) 02/17/2019   HCT 36.9 (L) 02/17/2019   MCV 89.6 02/17/2019   PLT 511 (H) 02/17/2019      Chemistry      Component Value Date/Time   NA 134 (L) 02/17/2019 1300   K 4.5 02/17/2019 1300   CL 95 (L) 02/17/2019 1300   CO2 28 02/17/2019 1300   BUN 7 02/17/2019 1300   CREATININE 0.76 02/17/2019 1300      Component Value Date/Time   CALCIUM 10.3 02/17/2019 1300   ALKPHOS 143 (H) 02/17/2019 1300   AST 22 02/17/2019 1300   ALT 21  02/17/2019 1300   BILITOT 0.5 02/17/2019 1300       RADIOGRAPHIC STUDIES: Dg Chest 2 View  Result Date: 02/14/2019 CLINICAL DATA:  Right neck and shoulder pain for 1 month. EXAM: CHEST - 2 VIEW COMPARISON:  PA and lateral chest 07/14/2017. FINDINGS: Nodular opacity in left upper lobe measures 3.4 cm in diameter. A more subtle and smaller nodular opacity is seen just above this 3.4 cm focus. Left basilar atelectasis is noted. The right lung is clear. Heart size is normal. IMPRESSION: Left upper lobe nodular opacities could be due to infection but have an appearance worrisome for neoplasm. Recommend chest CT with contrast for further evaluation. Electronically Signed   By: Inge Rise M.D.   On: 02/14/2019 16:55   Dg Cervical Spine Complete  Result Date: 02/14/2019 CLINICAL DATA:  Neck and right shoulder pain for 1 month. EXAM: CERVICAL SPINE - COMPLETE 4+ VIEW COMPARISON:  None. FINDINGS: There is no evidence of cervical spine fracture or prevertebral soft tissue swelling. Alignment is normal with straightening of lordosis noted. No other significant bone abnormalities are identified. Carotid atherosclerosis is seen bilaterally. IMPRESSION: Negative cervical spine radiographs. Carotid atherosclerosis. Electronically Signed   By: Inge Rise M.D.   On: 02/14/2019  16:60   Ct Chest W Contrast  Result Date: 02/14/2019 CLINICAL DATA:  Nodular left lung opacities on chest radiograph. EXAM: CT CHEST WITH CONTRAST TECHNIQUE: Multidetector CT imaging of the chest was performed during intravenous contrast administration. CONTRAST:  7mL OMNIPAQUE IOHEXOL 300 MG/ML  SOLN COMPARISON:  Chest radiograph from earlier today. FINDINGS: Cardiovascular: Top-normal heart size. Trace pericardial effusion/thickening. Atherosclerotic nonaneurysmal thoracic aorta. Top-normal caliber main pulmonary artery (3.4 cm diameter). No central pulmonary emboli. Mediastinum/Nodes: No discrete thyroid nodules. Unremarkable  esophagus. No axillary adenopathy. Enlarged 1.3 cm left supraclavicular node (series 2/image 19). Enlarged 1.3 cm right paratracheal node (series 2/image 40). Enlarged 1.3 cm subcarinal node (series 2/image 64). Infiltrative 6.4 x 4.7 cm soft tissue mass (series 2/image 53) centered in the aortopulmonary window, contiguous with the upper left hilum, encasing the left main and left upper and left lower bronchi and extending to the carina. No right hilar adenopathy. Lungs/Pleura: No pneumothorax. Trace dependent left pleural effusion. No right pleural effusion. Multiple poorly marginated sub solid left upper lobe lung masses and pulmonary nodules, largest 4.6 x 3.3 cm posteriorly (series 7/image 50). Additional representative 2.4 cm sub solid apical left upper lobe nodule (series 7/image 34). Clustered sub solid right middle lobe nodules, largest 8 mm (series 7/image 87). Subsegmental right lung base atelectasis. Upper abdomen: Posterior left perinephric 0.8 cm soft tissue nodule (series 2/image 139). Musculoskeletal: Faint sclerotic posterior T10 vertebral lesion (series 6/image 89). IMPRESSION: 1. Infiltrative 6.4 x 4.7 cm soft tissue mass centered in the AP window, contiguous with the upper left hilum, narrowing the central left lung airways, extending to the carina. Multiple subsolid lung masses and pulmonary nodules in the left upper lobe. Findings are most compatible with primary bronchogenic carcinoma, with the appearance favoring small cell lung carcinoma. 2. Left supraclavicular, subcarinal and right paratracheal adenopathy, suspicious for nodal metastatic disease. The left supraclavicular adenopathy may be amenable to ultrasound-guided percutaneous biopsy for diagnostic purposes. 3. Faint sclerotic posterior T10 vertebral lesion, cannot exclude osseous metastasis. 4. Small soft tissue nodule in the posterior left retroperitoneum, can not exclude soft tissue metastasis. 5. Suggest PET-CT for further staging  evaluation. Suggest multidisciplinary thoracic oncology consultation. 6. Trace pericardial effusion/thickening. Aortic Atherosclerosis (ICD10-I70.0). Electronically Signed   By: Ilona Sorrel M.D.   On: 02/14/2019 19:15    ASSESSMENT: This is a very pleasant 58 years old African-American male with highly suspicious lung cancer presented with multiple pulmonary nodules in the left and right lung as well as mediastinal and supraclavicular lymphadenopathy and suspicious bone lesion.   PLAN: I had a lengthy discussion with the patient and his family about his current condition and further investigation to confirm diagnosis. I personally and independently reviewed his scan images and discussed the result and showed the images to the patient and his family. I recommended for the patient to complete the staging work-up by ordering a PET scan as well as MRI of the brain. I will refer the patient to interventional audiology for consideration of ultrasound-guided core biopsy of the right supraclavicular lymph node for tissue diagnosis. For pain management he will continue with his current pain medication with tramadol and ibuprofen. I will arrange for the patient to come back for follow-up visit in around 2 weeks for evaluation and discussion of his treatment options based on the final staging work-up and tissue diagnosis. The patient was advised to call immediately if he has any concerning symptoms in the interval. The patient voices understanding of current disease status and treatment  options and is in agreement with the current care plan.  All questions were answered. The patient knows to call the clinic with any problems, questions or concerns. We can certainly see the patient much sooner if necessary.  Thank you so much for allowing me to participate in the care of GULED GAHAN. I will continue to follow up the patient with you and assist in his care.  I spent 40 minutes counseling the patient face to  face. The total time spent in the appointment was 60 minutes.  Disclaimer: This note was dictated with voice recognition software. Similar sounding words can inadvertently be transcribed and may not be corrected upon review.   Eilleen Kempf February 17, 2019, 1:42 PM

## 2019-02-19 ENCOUNTER — Telehealth: Payer: Self-pay | Admitting: *Deleted

## 2019-02-19 DIAGNOSIS — C3490 Malignant neoplasm of unspecified part of unspecified bronchus or lung: Secondary | ICD-10-CM

## 2019-02-19 NOTE — Telephone Encounter (Signed)
Oncology Nurse Navigator Documentation  Oncology Nurse Navigator Flowsheets 02/19/2019  Navigator Location [facility]  Navigator Encounter Type Telephone/I followed up on Willie Schmidt schedule. He has his biopsy, PET and MRI scheduled.  I updated Dr. Julien Nordmann. He would like to see patient on 03/03/2019.  I called updated on appt.    Telephone Outgoing Call  Treatment Phase Abnormal Scans  Barriers/Navigation Needs Education;Coordination of Care  Education Other  Interventions Coordination of Care;Education  Coordination of Care Appts  Education Method Verbal  Acuity Level 2  Time Spent with Patient 30

## 2019-02-24 ENCOUNTER — Other Ambulatory Visit: Payer: Self-pay | Admitting: Radiology

## 2019-02-25 ENCOUNTER — Ambulatory Visit (HOSPITAL_COMMUNITY)
Admission: RE | Admit: 2019-02-25 | Discharge: 2019-02-25 | Disposition: A | Discharge status: Home / Self Care | Payer: Medicaid Other | Source: Ambulatory Visit | Attending: Internal Medicine | Admitting: Internal Medicine

## 2019-02-25 ENCOUNTER — Encounter (HOSPITAL_COMMUNITY): Payer: Self-pay

## 2019-02-25 ENCOUNTER — Other Ambulatory Visit: Payer: Self-pay

## 2019-02-25 DIAGNOSIS — Z87891 Personal history of nicotine dependence: Secondary | ICD-10-CM | POA: Insufficient documentation

## 2019-02-25 DIAGNOSIS — C3490 Malignant neoplasm of unspecified part of unspecified bronchus or lung: Secondary | ICD-10-CM | POA: Insufficient documentation

## 2019-02-25 DIAGNOSIS — Z7982 Long term (current) use of aspirin: Secondary | ICD-10-CM | POA: Insufficient documentation

## 2019-02-25 DIAGNOSIS — M542 Cervicalgia: Secondary | ICD-10-CM | POA: Insufficient documentation

## 2019-02-25 DIAGNOSIS — Z79899 Other long term (current) drug therapy: Secondary | ICD-10-CM | POA: Diagnosis not present

## 2019-02-25 DIAGNOSIS — J90 Pleural effusion, not elsewhere classified: Secondary | ICD-10-CM | POA: Insufficient documentation

## 2019-02-25 DIAGNOSIS — M25511 Pain in right shoulder: Secondary | ICD-10-CM | POA: Diagnosis not present

## 2019-02-25 DIAGNOSIS — I10 Essential (primary) hypertension: Secondary | ICD-10-CM | POA: Insufficient documentation

## 2019-02-25 LAB — CBC WITH DIFFERENTIAL/PLATELET
Abs Immature Granulocytes: 0.06 10*3/uL (ref 0.00–0.07)
Basophils Absolute: 0.1 10*3/uL (ref 0.0–0.1)
Basophils Relative: 0 %
Eosinophils Absolute: 0.3 10*3/uL (ref 0.0–0.5)
Eosinophils Relative: 2 %
HCT: 39.9 % (ref 39.0–52.0)
Hemoglobin: 12.1 g/dL — ABNORMAL LOW (ref 13.0–17.0)
Immature Granulocytes: 0 %
Lymphocytes Relative: 27 %
Lymphs Abs: 4.4 10*3/uL — ABNORMAL HIGH (ref 0.7–4.0)
MCH: 28.3 pg (ref 26.0–34.0)
MCHC: 30.3 g/dL (ref 30.0–36.0)
MCV: 93.4 fL (ref 80.0–100.0)
Monocytes Absolute: 1.3 10*3/uL — ABNORMAL HIGH (ref 0.1–1.0)
Monocytes Relative: 8 %
Neutro Abs: 10.2 10*3/uL — ABNORMAL HIGH (ref 1.7–7.7)
Neutrophils Relative %: 63 %
Platelets: 530 10*3/uL — ABNORMAL HIGH (ref 150–400)
RBC: 4.27 MIL/uL (ref 4.22–5.81)
RDW: 14.6 % (ref 11.5–15.5)
WBC: 16.3 10*3/uL — ABNORMAL HIGH (ref 4.0–10.5)
nRBC: 0 % (ref 0.0–0.2)

## 2019-02-25 LAB — PROTIME-INR
INR: 1 (ref 0.8–1.2)
Prothrombin Time: 13.2 seconds (ref 11.4–15.2)

## 2019-02-25 MED ORDER — MIDAZOLAM HCL 2 MG/2ML IJ SOLN
INTRAMUSCULAR | Status: AC
  Filled 2019-02-25: qty 2

## 2019-02-25 MED ORDER — LIDOCAINE HCL (PF) 1 % IJ SOLN
INTRAMUSCULAR | Status: AC | PRN
  Administered 2019-02-25: 10 mL

## 2019-02-25 MED ORDER — MIDAZOLAM HCL 2 MG/2ML IJ SOLN
INTRAMUSCULAR | Status: AC | PRN
  Administered 2019-02-25 (×2): 1 mg via INTRAVENOUS

## 2019-02-25 MED ORDER — FENTANYL CITRATE (PF) 100 MCG/2ML IJ SOLN
INTRAMUSCULAR | Status: AC | PRN
  Administered 2019-02-25 (×2): 50 ug via INTRAVENOUS

## 2019-02-25 MED ORDER — SODIUM CHLORIDE 0.9 % IV SOLN
INTRAVENOUS | Status: DC
  Administered 2019-02-25: 11:00:00 via INTRAVENOUS

## 2019-02-25 MED ORDER — FENTANYL CITRATE (PF) 100 MCG/2ML IJ SOLN
INTRAMUSCULAR | Status: AC
  Filled 2019-02-25: qty 2

## 2019-02-25 MED ORDER — LIDOCAINE HCL 1 % IJ SOLN
INTRAMUSCULAR | Status: AC
  Filled 2019-02-25: qty 20

## 2019-02-25 MED ORDER — HYDROCODONE-ACETAMINOPHEN 5-325 MG PO TABS: 1.0000 | ORAL_TABLET | ORAL | Status: DC | PRN

## 2019-02-25 NOTE — Procedures (Signed)
Interventional Radiology Procedure:   Indications: Lung lesions and left supraclavicular lymph nodes  Procedure: US guided core biopsy of left supraclavicular lymph node  Findings: Multiple small abnormal lymph nodes.  4 cores from largest node  Complications: None     EBL: Minimal  Plan: Discharge to home in 1 hour.     Korine Winton R. Anselm Pancoast, MD  Pager: [phone]

## 2019-02-25 NOTE — H&P (Signed)
Chief Complaint: Patient was seen in consultation today for left supraclavicular lymphadenopathy.  Referring Physician(s): [name],[name]  Supervising Physician: Markus Daft  Patient Status: [facility] - Out-pt  History of Present Illness: Willie Schmidt is a 57 y.o. male with a past medical history of hypertension, bronchitis, and schizophrenia. He presented to [facility] ED 02/14/2019 with complaint of neck pain. Imaging revealed findings suspicious for lung cancer, and patient was discharged from the ED with urgent oncology referral. Patient was seen by Dr. Julien Nordmann 02/17/2019 for further management.  CT chest 02/14/2019: 1. Infiltrative 6.4 x 4.7 cm soft tissue mass centered in the AP window, contiguous with the upper left hilum, narrowing the central left lung airways, extending to the carina. Multiple subsolid lung masses and pulmonary nodules in the left upper lobe. Findings are most compatible with primary bronchogenic carcinoma, with the appearance favoring small cell lung carcinoma. 2. Left supraclavicular, subcarinal and right paratracheal adenopathy, suspicious for nodal metastatic disease. The left supraclavicular adenopathy may be amenable to ultrasound-guided percutaneous biopsy for diagnostic purposes. 3. Faint sclerotic posterior T10 vertebral lesion, cannot exclude osseous metastasis. 4. Small soft tissue nodule in the posterior left retroperitoneum, can not exclude soft tissue metastasis. 5. Suggest PET-CT for further staging evaluation. Suggest multidisciplinary thoracic oncology consultation. 6. Trace pericardial effusion/thickening.  IR requested by Dr. Julien Nordmann for possible image-guided left supraclavicular lymph node biopsy for tissue diagnosis. Patient awake and alert sitting in bed. Accompanied by wife and daughter. Complains of right neck pain, stable at this time. Denies fever, chills, chest pain, dyspnea, abdominal pain, dizziness, or headache.   Past Medical History:    Diagnosis Date  . Bronchitis, chronic ([place])   . Hypertension     History reviewed. No pertinent surgical history.  Allergies: Patient has no known allergies.  Medications: Prior to Admission medications   Medication Sig Start Date End Date Taking? Authorizing Provider  albuterol (PROVENTIL HFA;VENTOLIN HFA) 108 (90 BASE) MCG/ACT inhaler Inhale 2 puffs into the lungs every 6 (six) hours as needed for wheezing. 02/10/13   Hyman Bible, PA-C  aspirin 81 MG tablet Take 81 mg by mouth daily.    [provider]  cephALEXin (KEFLEX) 500 MG capsule Take 1 capsule (500 mg total) by mouth 4 (four) times daily. Patient not taking: Reported on 07/15/2015 08/02/13   Hyman Bible, PA-C  cyclobenzaprine (FLEXERIL) 10 MG tablet Take 1 tablet (10 mg total) by mouth 2 (two) times daily as needed for muscle spasms. 02/14/19   Ashley Murrain, NP  traMADol (ULTRAM) 50 MG tablet Take 1 tablet (50 mg total) by mouth every 6 (six) hours as needed. 02/14/19   Ashley Murrain, NP     History reviewed. No pertinent family history.  Social History   Socioeconomic History  . Marital status: Single    Spouse name: Not on file  . Number of children: Not on file  . Years of education: Not on file  . Highest education level: Not on file  Occupational History  . Not on file  Social Needs  . Financial resource strain: Not on file  . Food insecurity:    Worry: Not on file    Inability: Not on file  . Transportation needs:    Medical: Not on file    Non-medical: Not on file  Tobacco Use  . Smoking status: Former Smoker    Types: Cigarettes  . Smokeless tobacco: Never Used  Substance and Sexual Activity  . Alcohol use: Yes  . Drug  use: No  . Sexual activity: Not on file  Lifestyle  . Physical activity:    Days per week: Not on file    Minutes per session: Not on file  . Stress: Not on file  Relationships  . Social connections:    Talks on phone: Not on file    Gets together: Not on file     Attends religious service: Not on file    Active member of club or organization: Not on file    Attends meetings of clubs or organizations: Not on file    Relationship status: Not on file  Other Topics Concern  . Not on file  Social History Narrative  . Not on file     Review of Systems: A 12 point ROS discussed and pertinent positives are indicated in the HPI above.  All other systems are negative.  Review of Systems  Constitutional: Negative for chills and fever.  Respiratory: Negative for shortness of breath and wheezing.   Cardiovascular: Negative for chest pain and palpitations.  Gastrointestinal: Negative for abdominal pain.  Musculoskeletal: Positive for neck pain.  Neurological: Negative for dizziness and headaches.  Psychiatric/Behavioral: Negative for behavioral problems and confusion.    Vital Signs: BP (!) 150/108 (BP Location: Right Arm)   Pulse (!) 113   Temp 97.7 F (36.5 C) (Oral)   Resp 20   SpO2 99%   Physical Exam Vitals signs and nursing note reviewed.  Constitutional:      General: He is not in acute distress.    Appearance: Normal appearance.  Cardiovascular:     Rate and Rhythm: Normal rate and regular rhythm.     Heart sounds: Normal heart sounds. No murmur.  Pulmonary:     Effort: Pulmonary effort is normal. No respiratory distress.     Breath sounds: Normal breath sounds. No wheezing.  Skin:    General: Skin is warm and dry.  Neurological:     Mental Status: He is alert and oriented to person, place, and time.  Psychiatric:        Mood and Affect: Mood normal.        Behavior: Behavior normal.        Thought Content: Thought content normal.        Judgment: Judgment normal.      MD Evaluation Airway: WNL Heart: WNL Abdomen: WNL Chest/ Lungs: WNL ASA  Classification: 3 Mallampati/Airway Score: One   Imaging: Dg Chest 2 View  Result Date: 02/14/2019 CLINICAL DATA:  Right neck and shoulder pain for 1 month. EXAM: CHEST - 2  VIEW COMPARISON:  PA and lateral chest 07/14/2017. FINDINGS: Nodular opacity in left upper lobe measures 3.4 cm in diameter. A more subtle and smaller nodular opacity is seen just above this 3.4 cm focus. Left basilar atelectasis is noted. The right lung is clear. Heart size is normal. IMPRESSION: Left upper lobe nodular opacities could be due to infection but have an appearance worrisome for neoplasm. Recommend chest CT with contrast for further evaluation. Electronically Signed   By: Inge Rise M.D.   On: 02/14/2019 16:55   Dg Cervical Spine Complete  Result Date: 02/14/2019 CLINICAL DATA:  Neck and right shoulder pain for 1 month. EXAM: CERVICAL SPINE - COMPLETE 4+ VIEW COMPARISON:  None. FINDINGS: There is no evidence of cervical spine fracture or prevertebral soft tissue swelling. Alignment is normal with straightening of lordosis noted. No other significant bone abnormalities are identified. Carotid atherosclerosis is seen bilaterally. IMPRESSION: Negative cervical  spine radiographs. Carotid atherosclerosis. Electronically Signed   By: Inge Rise M.D.   On: 02/14/2019 16:52   Ct Chest W Contrast  Result Date: 02/14/2019 CLINICAL DATA:  Nodular left lung opacities on chest radiograph. EXAM: CT CHEST WITH CONTRAST TECHNIQUE: Multidetector CT imaging of the chest was performed during intravenous contrast administration. CONTRAST:  53mL OMNIPAQUE IOHEXOL 300 MG/ML  SOLN COMPARISON:  Chest radiograph from earlier today. FINDINGS: Cardiovascular: Top-normal heart size. Trace pericardial effusion/thickening. Atherosclerotic nonaneurysmal thoracic aorta. Top-normal caliber main pulmonary artery (3.4 cm diameter). No central pulmonary emboli. Mediastinum/Nodes: No discrete thyroid nodules. Unremarkable esophagus. No axillary adenopathy. Enlarged 1.3 cm left supraclavicular node (series 2/image 19). Enlarged 1.3 cm right paratracheal node (series 2/image 40). Enlarged 1.3 cm subcarinal node (series  2/image 64). Infiltrative 6.4 x 4.7 cm soft tissue mass (series 2/image 53) centered in the aortopulmonary window, contiguous with the upper left hilum, encasing the left main and left upper and left lower bronchi and extending to the carina. No right hilar adenopathy. Lungs/Pleura: No pneumothorax. Trace dependent left pleural effusion. No right pleural effusion. Multiple poorly marginated sub solid left upper lobe lung masses and pulmonary nodules, largest 4.6 x 3.3 cm posteriorly (series 7/image 50). Additional representative 2.4 cm sub solid apical left upper lobe nodule (series 7/image 34). Clustered sub solid right middle lobe nodules, largest 8 mm (series 7/image 87). Subsegmental right lung base atelectasis. Upper abdomen: Posterior left perinephric 0.8 cm soft tissue nodule (series 2/image 139). Musculoskeletal: Faint sclerotic posterior T10 vertebral lesion (series 6/image 89). IMPRESSION: 1. Infiltrative 6.4 x 4.7 cm soft tissue mass centered in the AP window, contiguous with the upper left hilum, narrowing the central left lung airways, extending to the carina. Multiple subsolid lung masses and pulmonary nodules in the left upper lobe. Findings are most compatible with primary bronchogenic carcinoma, with the appearance favoring small cell lung carcinoma. 2. Left supraclavicular, subcarinal and right paratracheal adenopathy, suspicious for nodal metastatic disease. The left supraclavicular adenopathy may be amenable to ultrasound-guided percutaneous biopsy for diagnostic purposes. 3. Faint sclerotic posterior T10 vertebral lesion, cannot exclude osseous metastasis. 4. Small soft tissue nodule in the posterior left retroperitoneum, can not exclude soft tissue metastasis. 5. Suggest PET-CT for further staging evaluation. Suggest multidisciplinary thoracic oncology consultation. 6. Trace pericardial effusion/thickening. Aortic Atherosclerosis (ICD10-I70.0). Electronically Signed   By: Ilona Sorrel M.D.    On: 02/14/2019 19:15    Labs:  CBC: Recent Labs    02/14/19 1715 02/17/19 1300 02/25/19 1118  WBC 12.6* 12.4* 16.3*  HGB 10.8* 11.9* 12.1*  HCT 34.4* 36.9* 39.9  PLT 426* 511* 530*    COAGS: Recent Labs    02/25/19 1118  INR 1.0    BMP: Recent Labs    02/14/19 1715 02/17/19 1300  NA 129* 134*  K 3.7 4.5  CL 91* 95*  CO2 26 28  GLUCOSE 102* 107*  BUN 9 7  CALCIUM 9.3 10.3  CREATININE 0.76 0.76  GFRNONAA >60 >60  GFRAA >60 >60    LIVER FUNCTION TESTS: Recent Labs    02/17/19 1300  BILITOT 0.5  AST 22  ALT 21  ALKPHOS 143*  PROT 8.6*  ALBUMIN 3.8     Assessment and Plan:  Left supraclavicular lymphadenopathy. Plan for image-guided left supraclavicular lymph node biopsy today with Dr. Anselm Pancoast. Patient is NPO. Afebrile. He does not take blood thinners. INR 1.0 seconds today.  Risks and benefits discussed with the patient including, but not limited to bleeding, infection, damage to  adjacent structures or low yield requiring additional tests. All of the patient's questions were answered, patient is agreeable to proceed. Consent signed and in chart.   Thank you for this interesting consult.  I greatly enjoyed meeting Willie Schmidt and look forward to participating in their care.  A copy of this report was sent to the requesting provider on this date.  Electronically Signed: Earley Abide, PA-C 02/25/2019, 12:38 PM   I spent a total of 30 Minutes in face to face in clinical consultation, greater than 50% of which was counseling/coordinating care for left supraclavicular lymphadenopathy.

## 2019-02-25 NOTE — Discharge Instructions (Addendum)
Moderate Conscious Sedation, Adult, Care After These instructions provide you with information about caring for yourself after your procedure. Your health care provider may also give you more specific instructions. Your treatment has been planned according to current medical practices, but problems sometimes occur. Call your health care provider if you have any problems or questions after your procedure. What can I expect after the procedure? After your procedure, it is common:  To feel sleepy for several hours.  To feel clumsy and have poor balance for several hours.  To have poor judgment for several hours.  To vomit if you eat too soon. Follow these instructions at home: For at least 24 hours after the procedure:   Do not: ? Participate in activities where you could fall or become injured. ? Drive. ? Use heavy machinery. ? Drink alcohol. ? Take sleeping pills or medicines that cause drowsiness. ? Make important decisions or sign legal documents. ? Take care of children on your own.  Rest. Eating and drinking  Follow the diet recommended by your health care provider.  If you vomit: ? Drink water, juice, or soup when you can drink without vomiting. ? Make sure you have little or no nausea before eating solid foods. General instructions  Have a responsible adult stay with you until you are awake and alert.  Take over-the-counter and prescription medicines only as told by your health care provider.  If you smoke, do not smoke without supervision.  Keep all follow-up visits as told by your health care provider. This is important. Contact a health care provider if:  You keep feeling nauseous or you keep vomiting.  You feel light-headed.  You develop a rash.  You have a fever. Get help right away if:  You have trouble breathing. This information is not intended to replace advice given to you by your health care provider. Make sure you discuss any questions you  have with your health care provider. Document Released: 09/29/2013 Document Revised: 05/13/2016 Document Reviewed: 03/30/2016 Elsevier Interactive Patient Education  2019 Elsevier Inc.   Needle Biopsy, Care After These instructions tell you how to care for yourself after your procedure. Your doctor may also give you more specific instructions. Call your doctor if you have any problems or questions. What can I expect after the procedure? After the procedure, it is common to have:  Soreness.  Bruising.  Mild pain. Follow these instructions at home:   Return to your normal activities as told by your doctor. Ask your doctor what activities are safe for you.  Take over-the-counter and prescription medicines only as told by your doctor.  Wash your hands with soap and water before you change your bandage (dressing). If you cannot use soap and water, use hand sanitizer.  Follow instructions from your doctor about: ? How to take care of your puncture site. ? When and how to change your bandage. ? When to remove your bandage.  Check your puncture site every day for signs of infection. Watch for: ? Redness, swelling, or pain. ? Fluid or blood. ? Pus or a bad smell. ? Warmth.  Do not take baths, swim, or use a hot tub until your doctor approves. Ask your doctor if you may take showers. You may only be allowed to take sponge baths.  Keep all follow-up visits as told by your doctor. This is important. Contact a doctor if you have:  A fever.  Redness, swelling, or pain at the puncture site, and it lasts  longer than a few days.  Fluid, blood, or pus coming from the puncture site.  Warmth coming from the puncture site. Get help right away if:  You have a lot of bleeding from the puncture site. Summary  After the procedure, it is common to have soreness, bruising, or mild pain at the puncture site.  Check your puncture site every day for signs of infection, such as redness,  swelling, or pain.  Get help right away if you have severe bleeding from your puncture site. This information is not intended to replace advice given to you by your health care provider. Make sure you discuss any questions you have with your health care provider. Document Released: 11/21/2008 Document Revised: 12/22/2017 Document Reviewed: 12/22/2017 Elsevier Interactive Patient Education  2019 [employer].

## 2019-03-01 ENCOUNTER — Telehealth: Payer: Self-pay | Admitting: Medical Oncology

## 2019-03-01 ENCOUNTER — Ambulatory Visit (HOSPITAL_COMMUNITY): Payer: Medicaid Other

## 2019-03-01 NOTE — Telephone Encounter (Signed)
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

## 2019-03-01 NOTE — Telephone Encounter (Signed)
PET and MRI were denied 02/26/2019 due to pathology not available.

## 2019-03-02 ENCOUNTER — Encounter: Payer: Self-pay | Admitting: *Deleted

## 2019-03-02 NOTE — Progress Notes (Signed)
Oncology Nurse Navigator Documentation  Oncology Nurse Navigator Flowsheets 03/02/2019  Navigator Location [facility]  Navigator Encounter Type Other/I was updated that patient's scans are not authorized.  I called [employer] and spoke to several people about getting this authorized.  I final spoke with a nurse and was given another case number and it will be reviewed now.  I updated managed care on new case number.    Treatment Phase Pre-Tx/Tx Discussion  Barriers/Navigation Needs Coordination of Care  Interventions Coordination of Care  Coordination of Care Other  Acuity Level 2  Time Spent with Patient 45

## 2019-03-03 ENCOUNTER — Telehealth: Payer: Self-pay | Admitting: Internal Medicine

## 2019-03-03 ENCOUNTER — Inpatient Hospital Stay: Payer: Medicaid Other

## 2019-03-03 ENCOUNTER — Other Ambulatory Visit: Payer: Self-pay

## 2019-03-03 ENCOUNTER — Inpatient Hospital Stay (HOSPITAL_BASED_OUTPATIENT_CLINIC_OR_DEPARTMENT_OTHER): Payer: Medicaid Other | Admitting: Internal Medicine

## 2019-03-03 ENCOUNTER — Encounter: Payer: Self-pay | Admitting: Internal Medicine

## 2019-03-03 ENCOUNTER — Inpatient Hospital Stay: Payer: Medicaid Other | Attending: Internal Medicine

## 2019-03-03 VITALS — BP 133/94 | HR 111 | Temp 98.7°F | Resp 20 | Ht 67.0 in | Wt 226.7 lb

## 2019-03-03 DIAGNOSIS — C3412 Malignant neoplasm of upper lobe, left bronchus or lung: Secondary | ICD-10-CM | POA: Insufficient documentation

## 2019-03-03 DIAGNOSIS — M62838 Other muscle spasm: Secondary | ICD-10-CM | POA: Diagnosis not present

## 2019-03-03 DIAGNOSIS — I6523 Occlusion and stenosis of bilateral carotid arteries: Secondary | ICD-10-CM | POA: Diagnosis not present

## 2019-03-03 DIAGNOSIS — I10 Essential (primary) hypertension: Secondary | ICD-10-CM

## 2019-03-03 DIAGNOSIS — M542 Cervicalgia: Secondary | ICD-10-CM | POA: Insufficient documentation

## 2019-03-03 DIAGNOSIS — Z5111 Encounter for antineoplastic chemotherapy: Secondary | ICD-10-CM | POA: Insufficient documentation

## 2019-03-03 DIAGNOSIS — Z5112 Encounter for antineoplastic immunotherapy: Secondary | ICD-10-CM | POA: Insufficient documentation

## 2019-03-03 DIAGNOSIS — C3490 Malignant neoplasm of unspecified part of unspecified bronchus or lung: Secondary | ICD-10-CM

## 2019-03-03 DIAGNOSIS — C3492 Malignant neoplasm of unspecified part of left bronchus or lung: Secondary | ICD-10-CM | POA: Insufficient documentation

## 2019-03-03 DIAGNOSIS — Z7189 Other specified counseling: Secondary | ICD-10-CM

## 2019-03-03 LAB — CBC WITH DIFFERENTIAL (CANCER CENTER ONLY)
Abs Immature Granulocytes: 0.04 10*3/uL (ref 0.00–0.07)
Basophils Absolute: 0 10*3/uL (ref 0.0–0.1)
Basophils Relative: 0 %
Eosinophils Absolute: 0.3 10*3/uL (ref 0.0–0.5)
Eosinophils Relative: 3 %
HCT: 34.5 % — ABNORMAL LOW (ref 39.0–52.0)
Hemoglobin: 10.9 g/dL — ABNORMAL LOW (ref 13.0–17.0)
Immature Granulocytes: 0 %
Lymphocytes Relative: 20 %
Lymphs Abs: 2.2 10*3/uL (ref 0.7–4.0)
MCH: 28.7 pg (ref 26.0–34.0)
MCHC: 31.6 g/dL (ref 30.0–36.0)
MCV: 90.8 fL (ref 80.0–100.0)
Monocytes Absolute: 1.2 10*3/uL — ABNORMAL HIGH (ref 0.1–1.0)
Monocytes Relative: 11 %
Neutro Abs: 7.2 10*3/uL (ref 1.7–7.7)
Neutrophils Relative %: 66 %
Platelet Count: 389 10*3/uL (ref 150–400)
RBC: 3.8 MIL/uL — ABNORMAL LOW (ref 4.22–5.81)
RDW: 14.2 % (ref 11.5–15.5)
WBC Count: 10.9 10*3/uL — ABNORMAL HIGH (ref 4.0–10.5)
nRBC: 0 % (ref 0.0–0.2)

## 2019-03-03 LAB — CMP (CANCER CENTER ONLY)
ALT: 14 U/L (ref 0–44)
AST: 23 U/L (ref 15–41)
Albumin: 3.6 g/dL (ref 3.5–5.0)
Alkaline Phosphatase: 148 U/L — ABNORMAL HIGH (ref 38–126)
Anion gap: 10 (ref 5–15)
BUN: 11 mg/dL (ref 6–20)
CO2: 28 mmol/L (ref 22–32)
Calcium: 9.5 mg/dL (ref 8.9–10.3)
Chloride: 93 mmol/L — ABNORMAL LOW (ref 98–111)
Creatinine: 0.77 mg/dL (ref 0.61–1.24)
GFR, Est AFR Am: >60 mL/min (ref >60)
GFR, Estimated: >60 mL/min (ref >60)
Glucose, Bld: 106 mg/dL — ABNORMAL HIGH (ref 70–99)
POTASSIUM: 4 mmol/L (ref 3.5–5.1)
Sodium: 131 mmol/L — ABNORMAL LOW (ref 135–145)
Total Bilirubin: 0.5 mg/dL (ref 0.3–1.2)
Total Protein: 8.1 g/dL (ref 6.5–8.1)

## 2019-03-03 MED ORDER — OXYCODONE-ACETAMINOPHEN 5-325 MG PO TABS: 1.0000 | ORAL_TABLET | Freq: Three times a day (TID) | ORAL | 0 refills | Status: DC | PRN

## 2019-03-03 MED ORDER — PROCHLORPERAZINE MALEATE 10 MG PO TABS: 10.0000 mg | ORAL_TABLET | Freq: Four times a day (QID) | ORAL | 0 refills | Status: DC | PRN

## 2019-03-03 MED ORDER — FOLIC ACID 1 MG PO TABS: 1.0000 mg | ORAL_TABLET | Freq: Every day | ORAL | 4 refills | Status: DC

## 2019-03-03 MED ORDER — CYANOCOBALAMIN 1000 MCG/ML IJ SOLN
1000.0000 ug | Freq: Once | INTRAMUSCULAR | Status: AC
  Administered 2019-03-03: 1000 ug via INTRAMUSCULAR

## 2019-03-03 NOTE — Patient Instructions (Signed)
Cyanocobalamin, Pyridoxine, and Folate What is this medicine? A multivitamin containing folic acid, vitamin B6, and vitamin B12. This medicine may be used for other purposes; ask your health care provider or pharmacist if you have questions. COMMON BRAND NAME(S): AllanFol RX, AllanTex, Av-Vite FB, B Complex with Folic Acid, ComBgen, FaBB, Folamin, Folastin, [place], [place], [place], [place], [place], [employer], [place]t RX 2.2, [place], [place] 2.2, Foltabs 800, Foltx, Homocysteine Formula, Niva-Fol, NuFol, TL [employer], Virt-Gard, Virt-Vite, Virt-Vite [place], Vita-Respa What should I tell my health care provider before I take this medicine? They need to know if you have any of these conditions: -bleeding or clotting disorder -history of anemia of any type -other chronic health condition -an unusual or allergic reaction to vitamins, other medicines, foods, dyes, or preservatives -pregnant or trying to get pregnant -breast-feeding How should I use this medicine? Take by mouth with a glass of water. May take with food. Follow the directions on the prescription label. It is usually given once a day. Do not take your medicine more often than directed. Contact your pediatrician regarding the use of this medicine in children. Special care may be needed. Overdosage: If you think you have taken too much of this medicine contact a poison control center or emergency room at once. NOTE: This medicine is only for you. Do not share this medicine with others. What if I miss a dose? If you miss a dose, take it as soon as you can. If it is almost time for your next dose, take only that dose. Do not take double or extra doses. What may interact with this medicine? -levodopa This list may not describe all possible interactions. Give your health care provider a list of all the medicines, herbs, non-prescription drugs, or dietary supplements you use. Also tell them if you smoke, drink alcohol, or use illegal drugs.  Some items may interact with your medicine. What should I watch for while using this medicine? See your health care professional for regular checks on your progress. Remember that vitamin supplements do not replace the need for good nutrition from a balanced diet. What side effects may I notice from receiving this medicine? Side effects that you should report to your doctor or health care professional as soon as possible: -allergic reaction such as skin rash or difficulty breathing -vomiting Side effects that usually do not require medical attention (report to your doctor or health care professional if they continue or are bothersome): -nausea -stomach upset This list may not describe all possible side effects. Call your doctor for medical advice about side effects. You may report side effects to FDA at 1-800-FDA-1088. Where should I keep my medicine? Keep out of the reach of children. Most vitamins should be stored at controlled room temperature. Check your specific product directions. Protect from heat and moisture. Throw away any unused medicine after the expiration date. NOTE: This sheet is a summary. It may not cover all possible information. If you have questions about this medicine, talk to your doctor, pharmacist, or health care provider.  2019 Elsevier/Gold Standard (2008-01-30 00:59:55)

## 2019-03-03 NOTE — Progress Notes (Signed)
START ON PATHWAY REGIMEN - Non-Small Cell Lung     A cycle is every 21 days:     Pembrolizumab      Pemetrexed      Carboplatin   **Always confirm dose/schedule in your pharmacy ordering system**  Patient Characteristics: Stage IV Metastatic, Nonsquamous, Initial Chemotherapy/Immunotherapy, PS = 0, 1, ALK Translocation Negative/Unknown and EGFR Mutation Negative/Non-Sensitizing/Unknown, PD-L1 Expression Positive 1-49% (TPS) / Negative / Not Tested / Awaiting Test Results  and Immunotherapy Candidate AJCC T Category: T2a Current Disease Status: Distant Metastases AJCC N Category: N3 AJCC M Category: M1c AJCC 8 Stage Grouping: IVB Histology: Nonsquamous Cell ROS1 Rearrangement Status: Awaiting Test Results T790M Mutation Status: Not Applicable - EGFR Mutation Negative/Unknown Other Mutations/Biomarkers: No Other Actionable Mutations NTRK Gene Fusion Status: Awaiting Test Results PD-L1 Expression Status: Awaiting Test Results Chemotherapy/Immunotherapy LOT: Initial Chemotherapy/Immunotherapy Molecular Targeted Therapy: Not Appropriate ALK Translocation Status: Awaiting Test Results EGFR Mutation Status: Awaiting Test Results BRAF V600E Mutation Status: Awaiting Test Results ECOG Performance Status: 1 Immunotherapy Candidate Status: Candidate for Immunotherapy Intent of Therapy: Non-Curative / Palliative Intent, Discussed with Patient

## 2019-03-03 NOTE — Telephone Encounter (Signed)
Gave avs and calendar ° °

## 2019-03-03 NOTE — Progress Notes (Signed)
[facility] [phone]   Fax:([phone]) [phone]  OFFICE PROGRESS NOTE  [name], MD [address] [place] [address]  DIAGNOSIS: Stage IV (2B, N3, M1 C) non-small cell lung cancer, adenocarcinoma presented with left upper lobe lung mass in addition to mediastinal and left supraclavicular lymphadenopathy as well as bilateral pulmonary nodules and suspicious bone lesion diagnosed in March 2020.  PRIOR THERAPY: None  CURRENT THERAPY: Systemic chemotherapy with carboplatin for AUC of 5, Alimta 500 mg/M2 and Keytruda 200 mg IV every 3 weeks.  First dose March 11, 2019.  INTERVAL HISTORY: Willie Schmidt 59 y.o. male returns to the clinic today for follow-up visit accompanied by his wife and daughter.  The patient continues to complain of pain in the right neck and shoulder area.  He also has shortness of breath with exertion with mild cough and no hemoptysis.  He denied having any current fever or chills.  He has no nausea, vomiting, diarrhea or constipation.  He lost few pounds since his last visit he also has intermittent headache with no visual changes.  He has no bleeding issues.  He underwent ultrasound-guided core biopsy of the enlarged left supraclavicular lymph node and the final pathology was consistent with metastatic adenocarcinoma of lung primary.  The patient also has ordered for a PET scan and MRI brain but unfortunately his insurance did not approve this imaging studies yet for no clear reason.  The patient is here today for evaluation and discussion of his treatment options.  MEDICAL HISTORY: Past Medical History:  Diagnosis Date   Bronchitis, chronic (HCC)    Hypertension     ALLERGIES:  has No Known Allergies.  MEDICATIONS:  Current Outpatient Medications  Medication Sig Dispense Refill   albuterol (PROVENTIL HFA;VENTOLIN HFA) 108 (90 BASE) MCG/ACT inhaler Inhale 2 puffs into the lungs every 6 (six) hours as needed for wheezing. 1  Inhaler 2   aspirin 81 MG tablet Take 81 mg by mouth daily.     cephALEXin (KEFLEX) 500 MG capsule Take 1 capsule (500 mg total) by mouth 4 (four) times daily. (Patient not taking: Reported on 07/15/2015) 28 capsule 0   cyclobenzaprine (FLEXERIL) 10 MG tablet Take 1 tablet (10 mg total) by mouth 2 (two) times daily as needed for muscle spasms. 20 tablet 0   traMADol (ULTRAM) 50 MG tablet Take 1 tablet (50 mg total) by mouth every 6 (six) hours as needed. 15 tablet 0   No current facility-administered medications for this visit.     SURGICAL HISTORY: No past surgical history on file.  REVIEW OF SYSTEMS:  Constitutional: positive for fatigue and weight loss Eyes: negative Ears, nose, mouth, throat, and face: negative Respiratory: positive for cough and dyspnea on exertion Cardiovascular: negative Gastrointestinal: negative Genitourinary:negative Integument/breast: negative Hematologic/lymphatic: negative Musculoskeletal:positive for neck pain Neurological: positive for headaches Behavioral/Psych: negative Endocrine: negative Allergic/Immunologic: negative   PHYSICAL EXAMINATION: General appearance: alert, cooperative, fatigued and no distress Head: Normocephalic, without obvious abnormality, atraumatic Neck: no adenopathy, no JVD, supple, symmetrical, trachea midline and thyroid not enlarged, symmetric, no tenderness/mass/nodules Lymph nodes: Cervical, supraclavicular, and axillary nodes normal. Resp: wheezes bilaterally Back: symmetric, no curvature. ROM normal. No CVA tenderness. Cardio: regular rate and rhythm, S1, S2 normal, no murmur, click, rub or gallop GI: soft, non-tender; bowel sounds normal; no masses,  no organomegaly Extremities: extremities normal, atraumatic, no cyanosis or edema Neurologic: Alert and oriented X 3, normal strength and tone. Normal symmetric  reflexes. Normal coordination and gait  ECOG PERFORMANCE STATUS: 1 - Symptomatic but completely  ambulatory  Blood pressure (!) 133/94, pulse (!) 111, temperature 98.7 F (37.1 C), temperature source Oral, resp. rate 20, height 5\' 7"  (1.702 m), weight 226 lb 11.2 oz (102.8 kg), SpO2 98 %.  LABORATORY DATA: Lab Results  Component Value Date   WBC 16.3 (H) 02/25/2019   HGB 12.1 (L) 02/25/2019   HCT 39.9 02/25/2019   MCV 93.4 02/25/2019   PLT 530 (H) 02/25/2019      Chemistry      Component Value Date/Time   NA 134 (L) 02/17/2019 1300   K 4.5 02/17/2019 1300   CL 95 (L) 02/17/2019 1300   CO2 28 02/17/2019 1300   BUN 7 02/17/2019 1300   CREATININE 0.76 02/17/2019 1300      Component Value Date/Time   CALCIUM 10.3 02/17/2019 1300   ALKPHOS 143 (H) 02/17/2019 1300   AST 22 02/17/2019 1300   ALT 21 02/17/2019 1300   BILITOT 0.5 02/17/2019 1300       RADIOGRAPHIC STUDIES: Dg Chest 2 View  Result Date: 02/14/2019 CLINICAL DATA:  Right neck and shoulder pain for 1 month. EXAM: CHEST - 2 VIEW COMPARISON:  PA and lateral chest 07/14/2017. FINDINGS: Nodular opacity in left upper lobe measures 3.4 cm in diameter. A more subtle and smaller nodular opacity is seen just above this 3.4 cm focus. Left basilar atelectasis is noted. The right lung is clear. Heart size is normal. IMPRESSION: Left upper lobe nodular opacities could be due to infection but have an appearance worrisome for neoplasm. Recommend chest CT with contrast for further evaluation. Electronically Signed   By: Inge Rise M.D.   On: 02/14/2019 16:55   Dg Cervical Spine Complete  Result Date: 02/14/2019 CLINICAL DATA:  Neck and right shoulder pain for 1 month. EXAM: CERVICAL SPINE - COMPLETE 4+ VIEW COMPARISON:  None. FINDINGS: There is no evidence of cervical spine fracture or prevertebral soft tissue swelling. Alignment is normal with straightening of lordosis noted. No other significant bone abnormalities are identified. Carotid atherosclerosis is seen bilaterally. IMPRESSION: Negative cervical spine radiographs.  Carotid atherosclerosis. Electronically Signed   By: Inge Rise M.D.   On: 02/14/2019 16:52   Ct Chest W Contrast  Result Date: 02/14/2019 CLINICAL DATA:  Nodular left lung opacities on chest radiograph. EXAM: CT CHEST WITH CONTRAST TECHNIQUE: Multidetector CT imaging of the chest was performed during intravenous contrast administration. CONTRAST:  62mL OMNIPAQUE IOHEXOL 300 MG/ML  SOLN COMPARISON:  Chest radiograph from earlier today. FINDINGS: Cardiovascular: Top-normal heart size. Trace pericardial effusion/thickening. Atherosclerotic nonaneurysmal thoracic aorta. Top-normal caliber main pulmonary artery (3.4 cm diameter). No central pulmonary emboli. Mediastinum/Nodes: No discrete thyroid nodules. Unremarkable esophagus. No axillary adenopathy. Enlarged 1.3 cm left supraclavicular node (series 2/image 19). Enlarged 1.3 cm right paratracheal node (series 2/image 40). Enlarged 1.3 cm subcarinal node (series 2/image 64). Infiltrative 6.4 x 4.7 cm soft tissue mass (series 2/image 53) centered in the aortopulmonary window, contiguous with the upper left hilum, encasing the left main and left upper and left lower bronchi and extending to the carina. No right hilar adenopathy. Lungs/Pleura: No pneumothorax. Trace dependent left pleural effusion. No right pleural effusion. Multiple poorly marginated sub solid left upper lobe lung masses and pulmonary nodules, largest 4.6 x 3.3 cm posteriorly (series 7/image 50). Additional representative 2.4 cm sub solid apical left upper lobe nodule (series 7/image 34). Clustered sub solid right middle lobe nodules, largest 8 mm (series  7/image 87). Subsegmental right lung base atelectasis. Upper abdomen: Posterior left perinephric 0.8 cm soft tissue nodule (series 2/image 139). Musculoskeletal: Faint sclerotic posterior T10 vertebral lesion (series 6/image 89). IMPRESSION: 1. Infiltrative 6.4 x 4.7 cm soft tissue mass centered in the AP window, contiguous with the upper left  hilum, narrowing the central left lung airways, extending to the carina. Multiple subsolid lung masses and pulmonary nodules in the left upper lobe. Findings are most compatible with primary bronchogenic carcinoma, with the appearance favoring small cell lung carcinoma. 2. Left supraclavicular, subcarinal and right paratracheal adenopathy, suspicious for nodal metastatic disease. The left supraclavicular adenopathy may be amenable to ultrasound-guided percutaneous biopsy for diagnostic purposes. 3. Faint sclerotic posterior T10 vertebral lesion, cannot exclude osseous metastasis. 4. Small soft tissue nodule in the posterior left retroperitoneum, can not exclude soft tissue metastasis. 5. Suggest PET-CT for further staging evaluation. Suggest multidisciplinary thoracic oncology consultation. 6. Trace pericardial effusion/thickening. Aortic Atherosclerosis (ICD10-I70.0). Electronically Signed   By: Ilona Sorrel M.D.   On: 02/14/2019 19:15   [place] Core Biopsy (lymph Nodes)  Result Date: 02/25/2019 INDICATION: 58 year old with abnormal mediastinal soft tissue and left upper lung lesions. Findings are concerning for primary lung cancer. Patient also has suspicious left supraclavicular lymph nodes. Tissue diagnosis is needed. EXAM: ULTRASOUND-GUIDED LEFT SUPRACLAVICULAR LYMPH NODE BIOPSY MEDICATIONS: None. ANESTHESIA/SEDATION: Moderate (conscious) sedation was employed during this procedure. A total of Versed 2.0 mg and Fentanyl 100 mcg was administered intravenously. Moderate Sedation Time: 16 minutes minutes. The patient's level of consciousness and vital signs were monitored continuously by radiology nursing throughout the procedure under my direct supervision. FLUOROSCOPY TIME:  None COMPLICATIONS: None immediate. PROCEDURE: Informed written consent was obtained from the patient after a thorough discussion of the procedural risks, benefits and alternatives. All questions were addressed. A timeout was performed prior  to the initiation of the procedure. Ultrasound was used to identify small irregular lymph nodes in the left supraclavicular region. Largest lymph node was targeted for biopsy. The neck was prepped with chlorhexidine and sterile field was created. Skin was anesthetized with 1% lidocaine. Core biopsies were obtained of the left supraclavicular lymph node with an 18 gauge core device. Specimens placed in saline. Bandage placed over the puncture site. FINDINGS: Small irregular hypoechoic lymph nodes in left supraclavicular region. Largest lymph node measured 1.3 x 0.9 x 1.4 cm. This lymph node was biopsied. No significant bleeding or hematoma formation following the core biopsies. IMPRESSION: Ultrasound-guided core biopsies of a left supraclavicular lymph node. Electronically Signed   By: Markus Daft M.D.   On: 02/25/2019 15:39    ASSESSMENT AND PLAN: This is a very pleasant 58 years old African-American male with likely stage IV non-small cell lung cancer, adenocarcinoma presented with large left upper lobe lung mass in addition to mediastinal and left supraclavicular lymphadenopathy as well as suspicious bone metastasis and bilateral pulmonary nodules diagnosed in March 2020. Unfortunately we do not have the imaging studies to complete the staging work-up yet because of delay from his insurance approval.  We will continue to work with him closely to get the PET scan and MRI brain approved. I will request the tissue block to be sent to [facility] for molecular studies and PDL 1 expression. I had a lengthy discussion with the patient and his family today about his current disease stage, prognosis and treatment options. The patient understand that he has incurable condition and all the treatment will be of palliative nature. I gave the patient the option  of palliative care and hospice referral versus consideration of palliative systemic chemotherapy with carboplatin for AUC of 5, Alimta 500 mg/M2 and Keytruda  200 mg IV every 3 weeks.  I discussed with the patient the adverse effect of this treatment including but not limited to alopecia, myelosuppression, nausea and vomiting, peripheral neuropathy, liver or renal dysfunction as well as immunotherapy adverse effects. I am not sure about the etiology of his left neck pain which she is not clear on the recent CT scan of the chest and I am hoping that his PET scan and MRI of the brain will be approved for further evaluation of his condition and to rule out any other metastatic disease. For the right neck pain I gave the patient prescription for Percocet to be used on as-needed basis. The patient would like to proceed with systemic chemotherapy and he will receive vitamin B12 injection today. I will also send prescription for folic acid 1 mg p.o. daily as well as Compazine 10 mg p.o. every 6 hours as needed for nausea to his pharmacy. I will arrange for the patient to have a chemotherapy education class before the first dose of his treatment. The patient will come back for follow-up visit in 4 weeks for evaluation and management of any adverse effect of his treatment before starting cycle #2. He was advised to call immediately if he has any concerning symptoms in the interval. The patient voices understanding of current disease status and treatment options and is in agreement with the current care plan.  All questions were answered. The patient knows to call the clinic with any problems, questions or concerns. We can certainly see the patient much sooner if necessary.  I spent 20 minutes counseling the patient face to face. The total time spent in the appointment was 25 minutes.  Disclaimer: This note was dictated with voice recognition software. Similar sounding words can inadvertently be transcribed and may not be corrected upon review.

## 2019-03-05 ENCOUNTER — Telehealth: Payer: Self-pay | Admitting: *Deleted

## 2019-03-05 NOTE — Telephone Encounter (Signed)
Received message from pt's daughter asking for a bit more information regarding treatment options for her father's proposed treatment. Reviewed side effects, treatment schedule etc and then explained that pt has patient education class on Monday @ 4pm and many of her questions will be answered then as well. She voiced understanding. No further questions or concerns

## 2019-03-08 ENCOUNTER — Inpatient Hospital Stay: Payer: Medicaid Other

## 2019-03-08 ENCOUNTER — Other Ambulatory Visit: Payer: Self-pay

## 2019-03-08 ENCOUNTER — Other Ambulatory Visit: Payer: Self-pay | Admitting: Internal Medicine

## 2019-03-08 DIAGNOSIS — C3492 Malignant neoplasm of unspecified part of left bronchus or lung: Secondary | ICD-10-CM

## 2019-03-10 ENCOUNTER — Telehealth: Payer: Self-pay | Admitting: Medical Oncology

## 2019-03-10 NOTE — Telephone Encounter (Signed)
-----   Message from Curt Bears, MD sent at 03/08/2019  8:35 PM EDT ----- Regarding: RE: pain med-q 8 not holding He can use ibuprofen and addition to the Percocet for now.  His scan did not show any lesion in the area where he is complaining. ----- Message ----- From: Ardeen Garland, RN Sent: 03/08/2019   4:13 PM EDT To: Curt Bears, MD Subject: pain med-q 8 not holding                       Percocet q 8 h is not holding off the pain in the neck.   He is with Abigail Butts in education now for the next 45 minutes. I am leaving

## 2019-03-10 NOTE — Telephone Encounter (Signed)
Pain management- Pt notified of [name]'s instructions

## 2019-03-11 ENCOUNTER — Encounter: Payer: Self-pay | Admitting: *Deleted

## 2019-03-11 ENCOUNTER — Ambulatory Visit (HOSPITAL_COMMUNITY): Admission: RE | Admit: 2019-03-11 | Payer: Medicaid Other | Source: Ambulatory Visit

## 2019-03-11 DIAGNOSIS — C3490 Malignant neoplasm of unspecified part of unspecified bronchus or lung: Secondary | ICD-10-CM

## 2019-03-11 NOTE — Progress Notes (Signed)
Oncology Nurse Navigator Documentation  Oncology Nurse Navigator Flowsheets 03/11/2019  Navigator Location [facility]-  Navigator Encounter Type Other/I followed up with Dr. Julien Nordmann regarding scans.  We were unable to get a PET scan approved.  Dr. Julien Nordmann gave me a verbal order for CT A/P.    Telephone -  Treatment Phase Pre-Tx/Tx Discussion  Barriers/Navigation Needs Coordination of Care  Education -  Interventions Coordination of Care  Coordination of Care Other  Education Method -  Acuity Level 1  Time Spent with Patient 15

## 2019-03-12 ENCOUNTER — Inpatient Hospital Stay (HOSPITAL_BASED_OUTPATIENT_CLINIC_OR_DEPARTMENT_OTHER): Payer: Medicaid Other | Admitting: Medical

## 2019-03-12 ENCOUNTER — Inpatient Hospital Stay: Payer: Medicaid Other

## 2019-03-12 ENCOUNTER — Other Ambulatory Visit: Payer: Self-pay | Admitting: Medical

## 2019-03-12 ENCOUNTER — Other Ambulatory Visit: Payer: Self-pay

## 2019-03-12 ENCOUNTER — Encounter: Payer: Self-pay | Admitting: Medical

## 2019-03-12 ENCOUNTER — Ambulatory Visit (HOSPITAL_COMMUNITY)
Admission: RE | Admit: 2019-03-12 | Discharge: 2019-03-12 | Disposition: A | Discharge status: Home / Self Care | Payer: Medicaid Other | Source: Ambulatory Visit | Attending: Internal Medicine | Admitting: Internal Medicine

## 2019-03-12 ENCOUNTER — Other Ambulatory Visit: Payer: Self-pay | Admitting: Internal Medicine

## 2019-03-12 VITALS — BP 155/95 | HR 119 | Temp 97.7°F | Resp 22

## 2019-03-12 DIAGNOSIS — I6523 Occlusion and stenosis of bilateral carotid arteries: Secondary | ICD-10-CM | POA: Diagnosis not present

## 2019-03-12 DIAGNOSIS — M62838 Other muscle spasm: Secondary | ICD-10-CM | POA: Diagnosis not present

## 2019-03-12 DIAGNOSIS — C3492 Malignant neoplasm of unspecified part of left bronchus or lung: Secondary | ICD-10-CM

## 2019-03-12 DIAGNOSIS — C3412 Malignant neoplasm of upper lobe, left bronchus or lung: Secondary | ICD-10-CM | POA: Diagnosis not present

## 2019-03-12 DIAGNOSIS — Z5112 Encounter for antineoplastic immunotherapy: Secondary | ICD-10-CM | POA: Diagnosis not present

## 2019-03-12 DIAGNOSIS — R202 Paresthesia of skin: Secondary | ICD-10-CM

## 2019-03-12 LAB — CMP (CANCER CENTER ONLY)
ALT: 17 U/L (ref 0–44)
AST: 24 U/L (ref 15–41)
Albumin: 3.9 g/dL (ref 3.5–5.0)
Alkaline Phosphatase: 139 U/L — ABNORMAL HIGH (ref 38–126)
Anion gap: 12 (ref 5–15)
BILIRUBIN TOTAL: 0.6 mg/dL (ref 0.3–1.2)
BUN: 9 mg/dL (ref 6–20)
CO2: 25 mmol/L (ref 22–32)
CREATININE: 0.75 mg/dL (ref 0.61–1.24)
Calcium: 9.4 mg/dL (ref 8.9–10.3)
Chloride: 96 mmol/L — ABNORMAL LOW (ref 98–111)
GFR, Est AFR Am: >60 mL/min (ref >60)
Glucose, Bld: 116 mg/dL — ABNORMAL HIGH (ref 70–99)
Potassium: 3.9 mmol/L (ref 3.5–5.1)
Sodium: 133 mmol/L — ABNORMAL LOW (ref 135–145)
Total Protein: 8.3 g/dL — ABNORMAL HIGH (ref 6.5–8.1)

## 2019-03-12 LAB — CBC WITH DIFFERENTIAL (CANCER CENTER ONLY)
Abs Immature Granulocytes: 0.03 10*3/uL (ref 0.00–0.07)
Basophils Absolute: 0 10*3/uL (ref 0.0–0.1)
Basophils Relative: 0 %
Eosinophils Absolute: 0.4 10*3/uL (ref 0.0–0.5)
Eosinophils Relative: 4 %
HEMATOCRIT: 34.6 % — AB (ref 39.0–52.0)
Hemoglobin: 10.7 g/dL — ABNORMAL LOW (ref 13.0–17.0)
Immature Granulocytes: 0 %
Lymphocytes Relative: 26 %
Lymphs Abs: 2.6 10*3/uL (ref 0.7–4.0)
MCH: 28.4 pg (ref 26.0–34.0)
MCHC: 30.9 g/dL (ref 30.0–36.0)
MCV: 91.8 fL (ref 80.0–100.0)
MONOS PCT: 9 %
Monocytes Absolute: 0.9 10*3/uL (ref 0.1–1.0)
Neutro Abs: 6.2 10*3/uL (ref 1.7–7.7)
Neutrophils Relative %: 61 %
Platelet Count: 370 10*3/uL (ref 150–400)
RBC: 3.77 MIL/uL — ABNORMAL LOW (ref 4.22–5.81)
RDW: 14.4 % (ref 11.5–15.5)
WBC Count: 10.2 10*3/uL (ref 4.0–10.5)
nRBC: 0 % (ref 0.0–0.2)

## 2019-03-12 LAB — TSH: TSH: 0.879 u[IU]/mL (ref 0.320–4.118)

## 2019-03-12 MED ORDER — SODIUM CHLORIDE 0.9 % IV SOLN
Freq: Once | INTRAVENOUS | Status: AC
  Administered 2019-03-12: 13:00:00 via INTRAVENOUS
  Filled 2019-03-12: qty 5

## 2019-03-12 MED ORDER — MORPHINE SULFATE (PF) 4 MG/ML IV SOLN
INTRAVENOUS | Status: AC
  Filled 2019-03-12: qty 1

## 2019-03-12 MED ORDER — SODIUM CHLORIDE 0.9 % IV SOLN
Freq: Once | INTRAVENOUS | Status: DC
  Filled 2019-03-12: qty 250

## 2019-03-12 MED ORDER — SODIUM CHLORIDE 0.9 % IV SOLN
200.0000 mg | Freq: Once | INTRAVENOUS | Status: AC
  Administered 2019-03-12: 200 mg via INTRAVENOUS
  Filled 2019-03-12: qty 8

## 2019-03-12 MED ORDER — PALONOSETRON HCL INJECTION 0.25 MG/5ML
0.2500 mg | Freq: Once | INTRAVENOUS | Status: AC
  Administered 2019-03-12: 0.25 mg via INTRAVENOUS

## 2019-03-12 MED ORDER — GABAPENTIN 300 MG PO CAPS: 300.0000 mg | ORAL_CAPSULE | Freq: Three times a day (TID) | ORAL | 3 refills | Status: DC

## 2019-03-12 MED ORDER — SODIUM CHLORIDE 0.9 % IV SOLN
750.0000 mL | Freq: Once | INTRAVENOUS | Status: AC
  Administered 2019-03-12: 250 mL via INTRAVENOUS
  Filled 2019-03-12: qty 750

## 2019-03-12 MED ORDER — SODIUM CHLORIDE 0.9 % IV SOLN
750.0000 mg | Freq: Once | INTRAVENOUS | Status: AC
  Administered 2019-03-12: 750 mg via INTRAVENOUS
  Filled 2019-03-12: qty 75

## 2019-03-12 MED ORDER — CYCLOBENZAPRINE HCL 10 MG PO TABS: 10.0000 mg | ORAL_TABLET | Freq: Three times a day (TID) | ORAL | 0 refills | Status: DC | PRN

## 2019-03-12 MED ORDER — PALONOSETRON HCL INJECTION 0.25 MG/5ML
INTRAVENOUS | Status: AC
  Filled 2019-03-12: qty 5

## 2019-03-12 MED ORDER — MORPHINE SULFATE 4 MG/ML IJ SOLN
2.0000 mg | Freq: Once | INTRAMUSCULAR | Status: AC
  Administered 2019-03-12: 2 mg via INTRAVENOUS
  Filled 2019-03-12: qty 1

## 2019-03-12 MED ORDER — DULOXETINE HCL 30 MG PO CPEP: ORAL_CAPSULE | ORAL | 3 refills | Status: DC

## 2019-03-12 MED ORDER — SODIUM CHLORIDE 0.9 % IV SOLN
500.0000 mg/m2 | Freq: Once | INTRAVENOUS | Status: AC
  Administered 2019-03-12: 1100 mg via INTRAVENOUS
  Filled 2019-03-12: qty 4

## 2019-03-12 NOTE — Patient Instructions (Signed)
Carboplatin injection What is this medicine? CARBOPLATIN (KAR boe pla tin) is a chemotherapy drug. It targets fast dividing cells, like cancer cells, and causes these cells to die. This medicine is used to treat ovarian cancer and many other cancers. This medicine may be used for other purposes; ask your health care provider or pharmacist if you have questions. COMMON BRAND NAME(S): Paraplatin What should I tell my health care provider before I take this medicine? They need to know if you have any of these conditions: -blood disorders -hearing problems -kidney disease -recent or ongoing radiation therapy -an unusual or allergic reaction to carboplatin, cisplatin, other chemotherapy, other medicines, foods, dyes, or preservatives -pregnant or trying to get pregnant -breast-feeding How should I use this medicine? This drug is usually given as an infusion into a vein. It is administered in a hospital or clinic by a specially trained health care professional. Talk to your pediatrician regarding the use of this medicine in children. Special care may be needed. Overdosage: If you think you have taken too much of this medicine contact a poison control center or emergency room at once. NOTE: This medicine is only for you. Do not share this medicine with others. What if I miss a dose? It is important not to miss a dose. Call your doctor or health care professional if you are unable to keep an appointment. What may interact with this medicine? -medicines for seizures -medicines to increase blood counts like filgrastim, pegfilgrastim, sargramostim -some antibiotics like amikacin, gentamicin, neomycin, streptomycin, tobramycin -vaccines Talk to your doctor or health care professional before taking any of these medicines: -acetaminophen -aspirin -ibuprofen -ketoprofen -naproxen This list may not describe all possible interactions. Give your health care provider a list of all the medicines, herbs,  non-prescription drugs, or dietary supplements you use. Also tell them if you smoke, drink alcohol, or use illegal drugs. Some items may interact with your medicine. What should I watch for while using this medicine? Your condition will be monitored carefully while you are receiving this medicine. You will need important blood work done while you are taking this medicine. This drug may make you feel generally unwell. This is not uncommon, as chemotherapy can affect healthy cells as well as cancer cells. Report any side effects. Continue your course of treatment even though you feel ill unless your doctor tells you to stop. In some cases, you may be given additional medicines to help with side effects. Follow all directions for their use. Call your doctor or health care professional for advice if you get a fever, chills or sore throat, or other symptoms of a cold or flu. Do not treat yourself. This drug decreases your body's ability to fight infections. Try to avoid being around people who are sick. This medicine may increase your risk to bruise or bleed. Call your doctor or health care professional if you notice any unusual bleeding. Be careful brushing and flossing your teeth or using a toothpick because you may get an infection or bleed more easily. If you have any dental work done, tell your dentist you are receiving this medicine. Avoid taking products that contain aspirin, acetaminophen, ibuprofen, naproxen, or ketoprofen unless instructed by your doctor. These medicines may hide a fever. Do not become pregnant while taking this medicine. Women should inform their doctor if they wish to become pregnant or think they might be pregnant. There is a potential for serious side effects to an unborn child. Talk to your health care professional or  pharmacist for more information. Do not breast-feed an infant while taking this medicine. What side effects may I notice from receiving this medicine? Side effects  that you should report to your doctor or health care professional as soon as possible: -allergic reactions like skin rash, itching or hives, swelling of the face, lips, or tongue -signs of infection - fever or chills, cough, sore throat, pain or difficulty passing urine -signs of decreased platelets or bleeding - bruising, pinpoint red spots on the skin, black, tarry stools, nosebleeds -signs of decreased red blood cells - unusually weak or tired, fainting spells, lightheadedness -breathing problems -changes in hearing -changes in vision -chest pain -high blood pressure -low blood counts - This drug may decrease the number of white blood cells, red blood cells and platelets. You may be at increased risk for infections and bleeding. -nausea and vomiting -pain, swelling, redness or irritation at the injection site -pain, tingling, numbness in the hands or feet -problems with balance, talking, walking -trouble passing urine or change in the amount of urine Side effects that usually do not require medical attention (report to your doctor or health care professional if they continue or are bothersome): -hair loss -loss of appetite -metallic taste in the mouth or changes in taste This list may not describe all possible side effects. Call your doctor for medical advice about side effects. You may report side effects to FDA at 1-800-FDA-1088. Where should I keep my medicine? This drug is given in a hospital or clinic and will not be stored at home. NOTE: This sheet is a summary. It may not cover all possible information. If you have questions about this medicine, talk to your doctor, pharmacist, or health care provider.  2019 Elsevier/Gold Standard (2008-03-15 14:38:05) Pemetrexed injection What is this medicine? PEMETREXED (PEM e TREX ed) is a chemotherapy drug used to treat lung cancers like non-small cell lung cancer and mesothelioma. It may also be used to treat other cancers. This medicine  may be used for other purposes; ask your health care provider or pharmacist if you have questions. COMMON BRAND NAME(S): Alimta What should I tell my health care provider before I take this medicine? They need to know if you have any of these conditions: -infection (especially a virus infection such as chickenpox, cold sores, or herpes) -kidney disease -low blood counts, like low white cell, platelet, or red cell counts -lung or breathing disease, like asthma -radiation therapy -an unusual or allergic reaction to pemetrexed, other medicines, foods, dyes, or preservative -pregnant or trying to get pregnant -breast-feeding How should I use this medicine? This drug is given as an infusion into a vein. It is administered in a hospital or clinic by a specially trained health care professional. Talk to your pediatrician regarding the use of this medicine in children. Special care may be needed. Overdosage: If you think you have taken too much of this medicine contact a poison control center or emergency room at once. NOTE: This medicine is only for you. Do not share this medicine with others. What if I miss a dose? It is important not to miss your dose. Call your doctor or health care professional if you are unable to keep an appointment. What may interact with this medicine? This medicine may interact with the following medications: -Ibuprofen This list may not describe all possible interactions. Give your health care provider a list of all the medicines, herbs, non-prescription drugs, or dietary supplements you use. Also tell them if you smoke,  drink alcohol, or use illegal drugs. Some items may interact with your medicine. What should I watch for while using this medicine? Visit your doctor for checks on your progress. This drug may make you feel generally unwell. This is not uncommon, as chemotherapy can affect healthy cells as well as cancer cells. Report any side effects. Continue your course  of treatment even though you feel ill unless your doctor tells you to stop. In some cases, you may be given additional medicines to help with side effects. Follow all directions for their use. Call your doctor or health care professional for advice if you get a fever, chills or sore throat, or other symptoms of a cold or flu. Do not treat yourself. This drug decreases your body's ability to fight infections. Try to avoid being around people who are sick. This medicine may increase your risk to bruise or bleed. Call your doctor or health care professional if you notice any unusual bleeding. Be careful brushing and flossing your teeth or using a toothpick because you may get an infection or bleed more easily. If you have any dental work done, tell your dentist you are receiving this medicine. Avoid taking products that contain aspirin, acetaminophen, ibuprofen, naproxen, or ketoprofen unless instructed by your doctor. These medicines may hide a fever. Call your doctor or health care professional if you get diarrhea or mouth sores. Do not treat yourself. To protect your kidneys, drink water or other fluids as directed while you are taking this medicine. Do not become pregnant while taking this medicine or for 6 months after stopping it. Women should inform their doctor if they wish to become pregnant or think they might be pregnant. Men should not father a child while taking this medicine and for 3 months after stopping it. This may interfere with the ability to father a child. You should talk to your doctor or health care professional if you are concerned about your fertility. There is a potential for serious side effects to an unborn child. Talk to your health care professional or pharmacist for more information. Do not breast-feed an infant while taking this medicine or for 1 week after stopping it. What side effects may I notice from receiving this medicine? Side effects that you should report to your  doctor or health care professional as soon as possible: -allergic reactions like skin rash, itching or hives, swelling of the face, lips, or tongue -breathing problems -redness, blistering, peeling or loosening of the skin, including inside the mouth -signs and symptoms of bleeding such as bloody or black, tarry stools; red or dark-brown urine; spitting up blood or brown material that looks like coffee grounds; red spots on the skin; unusual bruising or bleeding from the eye, gums, or nose -signs and symptoms of infection like fever or chills; cough; sore throat; pain or trouble passing urine -signs and symptoms of kidney injury like trouble passing urine or change in the amount of urine -signs and symptoms of liver injury like dark yellow or brown urine; general ill feeling or flu-like symptoms; light-colored stools; loss of appetite; nausea; right upper belly pain; unusually weak or tired; yellowing of the eyes or skin Side effects that usually do not require medical attention (report to your doctor or health care professional if they continue or are bothersome): -constipation -mouth sores -nausea, vomiting -unusually weak or tired This list may not describe all possible side effects. Call your doctor for medical advice about side effects. You may report side effects  to FDA at 1-800-FDA-1088. Where should I keep my medicine? This drug is given in a hospital or clinic and will not be stored at home. NOTE: This sheet is a summary. It may not cover all possible information. If you have questions about this medicine, talk to your doctor, pharmacist, or health care provider.  2019 Elsevier/Gold Standard (2018-01-28 16:11:33) Pembrolizumab injection What is this medicine? PEMBROLIZUMAB (pem broe liz ue mab) is a monoclonal antibody. It is used to treat cervical cancer, esophageal cancer, head and neck cancer, hepatocellular cancer, Hodgkin lymphoma, kidney cancer, lymphoma, melanoma, Merkel cell  carcinoma, lung cancer, stomach cancer, urothelial cancer, and cancers that have a certain genetic condition. This medicine may be used for other purposes; ask your health care provider or pharmacist if you have questions. COMMON BRAND NAME(S): Keytruda What should I tell my health care provider before I take this medicine? They need to know if you have any of these conditions: -diabetes -immune system problems -inflammatory bowel disease -liver disease -lung or breathing disease -lupus -received or scheduled to receive an organ transplant or a stem-cell transplant that uses donor stem cells -an unusual or allergic reaction to pembrolizumab, other medicines, foods, dyes, or preservatives -pregnant or trying to get pregnant -breast-feeding How should I use this medicine? This medicine is for infusion into a vein. It is given by a health care professional in a hospital or clinic setting. A special MedGuide will be given to you before each treatment. Be sure to read this information carefully each time. Talk to your pediatrician regarding the use of this medicine in children. While this drug may be prescribed for selected conditions, precautions do apply. Overdosage: If you think you have taken too much of this medicine contact a poison control center or emergency room at once. NOTE: This medicine is only for you. Do not share this medicine with others. What if I miss a dose? It is important not to miss your dose. Call your doctor or health care professional if you are unable to keep an appointment. What may interact with this medicine? Interactions have not been studied. Give your health care provider a list of all the medicines, herbs, non-prescription drugs, or dietary supplements you use. Also tell them if you smoke, drink alcohol, or use illegal drugs. Some items may interact with your medicine. This list may not describe all possible interactions. Give your health care provider a list of  all the medicines, herbs, non-prescription drugs, or dietary supplements you use. Also tell them if you smoke, drink alcohol, or use illegal drugs. Some items may interact with your medicine. What should I watch for while using this medicine? Your condition will be monitored carefully while you are receiving this medicine. You may need blood work done while you are taking this medicine. Do not become pregnant while taking this medicine or for 4 months after stopping it. Women should inform their doctor if they wish to become pregnant or think they might be pregnant. There is a potential for serious side effects to an unborn child. Talk to your health care professional or pharmacist for more information. Do not breast-feed an infant while taking this medicine or for 4 months after the last dose. What side effects may I notice from receiving this medicine? Side effects that you should report to your doctor or health care professional as soon as possible: -allergic reactions like skin rash, itching or hives, swelling of the face, lips, or tongue -bloody or black, tarry -breathing problems -  changes in vision -chest pain -chills -confusion -constipation -cough -diarrhea -dizziness or feeling faint or lightheaded -fast or irregular heartbeat -fever -flushing -hair loss -joint pain -low blood counts - this medicine may decrease the number of white blood cells, red blood cells and platelets. You may be at increased risk for infections and bleeding. -muscle pain -muscle weakness -persistent headache -redness, blistering, peeling or loosening of the skin, including inside the mouth -signs and symptoms of high blood sugar such as dizziness; dry mouth; dry skin; fruity breath; nausea; stomach pain; increased hunger or thirst; increased urination -signs and symptoms of kidney injury like trouble passing urine or change in the amount of urine -signs and symptoms of liver injury like dark urine,  light-colored stools, loss of appetite, nausea, right upper belly pain, yellowing of the eyes or skin -sweating -swollen lymph nodes -weight loss Side effects that usually do not require medical attention (report to your doctor or health care professional if they continue or are bothersome): -decreased appetite -muscle pain -tiredness This list may not describe all possible side effects. Call your doctor for medical advice about side effects. You may report side effects to FDA at 1-800-FDA-1088. Where should I keep my medicine? This drug is given in a hospital or clinic and will not be stored at home. NOTE: This sheet is a summary. It may not cover all possible information. If you have questions about this medicine, talk to your doctor, pharmacist, or health care provider.  2019 Elsevier/Gold Standard (2018-07-23 15:06:10) [place] Discharge Instructions for Patients Receiving Chemotherapy  Today you received the following chemotherapy agents Carboplatin, Alimta, Keytruda. To help prevent nausea and vomiting after your treatment, we encourage you to take your nausea medication. DO NOT TAKE ONDANSTARON FOR THREE DAYS AFTER TREATMENT.  If you develop nausea and vomiting that is not controlled by your nausea medication, call the clinic.   BELOW ARE SYMPTOMS THAT SHOULD BE REPORTED IMMEDIATELY:  *FEVER GREATER THAN 100.5 F  *CHILLS WITH OR WITHOUT FEVER  NAUSEA AND VOMITING THAT IS NOT CONTROLLED WITH YOUR NAUSEA MEDICATION  *UNUSUAL SHORTNESS OF BREATH  *UNUSUAL BRUISING OR BLEEDING  TENDERNESS IN MOUTH AND THROAT WITH OR WITHOUT PRESENCE OF ULCERS  *URINARY PROBLEMS  *BOWEL PROBLEMS  UNUSUAL RASH Items with * indicate a potential emergency and should be followed up as soon as possible.  Feel free to call the clinic should you have any questions or concerns. The clinic phone number is [phone].  Please show the [place] at check-in to the Emergency Department and triage nurse.

## 2019-03-12 NOTE — Progress Notes (Signed)
Okay to treat today with HR over 100bpp, per [name].  Pt. Completed first day Keytuda, Alimta, and Carbo, without complaint. Pts pain issues addressed by Sandi Mealy.

## 2019-03-15 ENCOUNTER — Encounter: Payer: Self-pay | Admitting: *Deleted

## 2019-03-15 NOTE — Progress Notes (Signed)
[facility] Progress Note   Willie Schmidt [id number] Jul 18, 1961 58 y.o.  Willie Schmidt is managed by Dr. Fanny Bien. Willie Schmidt  Actively treated with chemotherapy/immunotherapy/hormonal therapy: yes  Current therapy: Carboplatin, Keytruda, and Alimta  Last treated: 03/12/2019 (cycle 1, day 1)  Next scheduled appointment with provider: 03/25/2019  Assessment: Plan:    Paresthesia  Muscle spasm  Adenocarcinoma of left lung, stage 4 (HCC)   Paresthesia of the right SCM and neck: I reviewed the patient's scans to date and do not see findings that would account for his pain.  Despite this, I gave the patient a prescription for gabapentin 300 mg p.o. 3 times daily and Cymbalta 30 mg p.o. twice daily.  He was given 2 mg of IV morphine today.  Right mid scapular muscle spasm: The patient was given a refill of Flexeril.  Stage IV adenocarcinoma of the lung: The patient continues to be managed by Dr. Julien Nordmann and is receiving cycle 1, day 1 of carboplatin, Keytruda, and Alimta today.  He will follow-up with Dr. Bretta Bang as scheduled on 03/25/2019.  Please see After Visit Summary for patient specific instructions.  Future Appointments  Date Time Provider [place]  03/18/2019  8:15 AM [facility] None  03/25/2019  8:15 AM [facility] None  04/01/2019  8:00 AM [facility] LAB 5 [facility] None  04/01/2019  9:00 AM Heilingoetter, Cassandra L, PA-C [facility] None  04/01/2019 10:00 AM [facility] INFUSION [facility] None  04/08/2019  8:15 AM [facility] LAB 4 [facility] None  04/15/2019  8:15 AM [facility] None  04/22/2019  8:15 AM [facility] LAB 6 [facility] None  04/22/2019  9:00 AM [facility] INFUSION [facility] None    No orders of the defined types were placed in this encounter.      Subjective:   [name] is a 58 y.o. (DOB 1961-01-24) male.  Chief Complaint: No chief complaint on  file.   HPI Willie Schmidt   is a 58 year old male with a history of a stage IV adenocarcinoma of the left lung who is managed by Dr. Julien Nordmann and is receiving cycle 1, day 1 of carboplatin, Keytruda, and Alimta today.  I was asked to see the patient in the infusion room today as he was receiving treatment.  He reports that he is having significant right mid scapular and right neck pain.  He has been using oxycodone and Motrin without relief of his symptoms.  He describes the pain as being a burning and grabbing pain.  It has interfered with his sleep.  He reports that his pain has been so severe at night that he is up out of bed and even has been crying.  He denies changes in activity or trauma.   Medications: I have reviewed the patient's current medications.  Allergies: No Known Allergies  Past Medical History:  Diagnosis Date   Bronchitis, chronic ([place])    Hypertension     No past surgical history on file.  No family history on file.  Social History   Socioeconomic History   Marital status: Single    Spouse name: Not on file   Number of children: Not on file   Years of education: Not on file   Highest education level: Not on file  Occupational History   Not on file  Social Needs   Financial resource strain: Not on file   Food insecurity:    Worry: Not on file  Inability: Not on file   Transportation needs:    Medical: Not on file    Non-medical: Not on file  Tobacco Use   Smoking status: Former Smoker    Types: Cigarettes   Smokeless tobacco: Never Used  Substance and Sexual Activity   Alcohol use: Yes   Drug use: No   Sexual activity: Not on file  Lifestyle   Physical activity:    Days per week: Not on file    Minutes per session: Not on file   Stress: Not on file  Relationships   Social connections:    Talks on phone: Not on file    Gets together: Not on file    Attends religious service: Not on file    Active member of club or  organization: Not on file    Attends meetings of clubs or organizations: Not on file    Relationship status: Not on file   Intimate partner violence:    Fear of current or ex partner: Not on file    Emotionally abused: Not on file    Physically abused: Not on file    Forced sexual activity: Not on file  Other Topics Concern   Not on file  Social History Narrative   Not on file    Past Medical History, Surgical history, Social history, and Family history were reviewed and updated as appropriate.   Please see review of systems for further details on the patient's review from today.   Review of Systems:  Review of Systems  Constitutional: Negative for chills, diaphoresis and fever.  HENT: Negative for trouble swallowing and voice change.   Respiratory: Negative for cough, chest tightness, shortness of breath and wheezing.   Cardiovascular: Negative for chest pain and palpitations.  Gastrointestinal: Negative for abdominal pain, constipation, diarrhea, nausea and vomiting.  Musculoskeletal: Positive for myalgias and neck pain. Negative for back pain.  Neurological: Negative for dizziness, light-headedness and headaches.  Psychiatric/Behavioral: Positive for sleep disturbance.    Objective:   Physical Exam:  There were no vitals taken for this visit. ECOG: 1  Physical Exam Constitutional:      General: He is not in acute distress.    Appearance: He is not diaphoretic.  HENT:     Head: Normocephalic and atraumatic.  Eyes:     General: No scleral icterus.       Right eye: No discharge.        Left eye: No discharge.     Conjunctiva/sclera: Conjunctivae normal.  Cardiovascular:     Rate and Rhythm: Normal rate and regular rhythm.     Heart sounds: Normal heart sounds. No murmur. No friction rub. No gallop.   Pulmonary:     Effort: Pulmonary effort is normal. No respiratory distress.     Breath sounds: Normal breath sounds. No wheezing or rales.  Musculoskeletal:         General: Tenderness present.     Comments: Tenderness and induration over the superior right SCM and mid scapula consistent with a muscle spasm.  Skin:    General: Skin is warm and dry.     Findings: No erythema or rash.  Neurological:     Mental Status: He is alert.     Lab Review:     Component Value Date/Time   NA 133 (L) 03/12/2019 1059   K 3.9 03/12/2019 1059   CL 96 (L) 03/12/2019 1059   CO2 25 03/12/2019 1059   GLUCOSE 116 (H) 03/12/2019 1059  BUN 9 03/12/2019 1059   CREATININE 0.75 03/12/2019 1059   CALCIUM 9.4 03/12/2019 1059   PROT 8.3 (H) 03/12/2019 1059   ALBUMIN 3.9 03/12/2019 1059   AST 24 03/12/2019 1059   ALT 17 03/12/2019 1059   ALKPHOS 139 (H) 03/12/2019 1059   BILITOT 0.6 03/12/2019 1059   GFRNONAA >60 03/12/2019 1059   GFRAA >60 03/12/2019 1059       Component Value Date/Time   WBC 10.2 03/12/2019 1059   WBC 16.3 (H) 02/25/2019 1118   RBC 3.77 (L) 03/12/2019 1059   HGB 10.7 (L) 03/12/2019 1059   HCT 34.6 (L) 03/12/2019 1059   PLT 370 03/12/2019 1059   MCV 91.8 03/12/2019 1059   MCH 28.4 03/12/2019 1059   MCHC 30.9 03/12/2019 1059   RDW 14.4 03/12/2019 1059   LYMPHSABS 2.6 03/12/2019 1059   MONOABS 0.9 03/12/2019 1059   EOSABS 0.4 03/12/2019 1059   BASOSABS 0.0 03/12/2019 1059   -------------------------------  Imaging from last 24 hours (if applicable):  Radiology interpretation: Dg Chest 2 View  Result Date: 02/14/2019 CLINICAL DATA:  Right neck and shoulder pain for 1 month. EXAM: CHEST - 2 VIEW COMPARISON:  PA and lateral chest 07/14/2017. FINDINGS: Nodular opacity in left upper lobe measures 3.4 cm in diameter. A more subtle and smaller nodular opacity is seen just above this 3.4 cm focus. Left basilar atelectasis is noted. The right lung is clear. Heart size is normal. IMPRESSION: Left upper lobe nodular opacities could be due to infection but have an appearance worrisome for neoplasm. Recommend chest CT with contrast for further  evaluation. Electronically Signed   By: Inge Rise M.D.   On: 02/14/2019 16:55   Dg Cervical Spine Complete  Result Date: 02/14/2019 CLINICAL DATA:  Neck and right shoulder pain for 1 month. EXAM: CERVICAL SPINE - COMPLETE 4+ VIEW COMPARISON:  None. FINDINGS: There is no evidence of cervical spine fracture or prevertebral soft tissue swelling. Alignment is normal with straightening of lordosis noted. No other significant bone abnormalities are identified. Carotid atherosclerosis is seen bilaterally. IMPRESSION: Negative cervical spine radiographs. Carotid atherosclerosis. Electronically Signed   By: Inge Rise M.D.   On: 02/14/2019 16:52   Ct Chest W Contrast  Result Date: 02/14/2019 CLINICAL DATA:  Nodular left lung opacities on chest radiograph. EXAM: CT CHEST WITH CONTRAST TECHNIQUE: Multidetector CT imaging of the chest was performed during intravenous contrast administration. CONTRAST:  71mL OMNIPAQUE IOHEXOL 300 MG/ML  SOLN COMPARISON:  Chest radiograph from earlier today. FINDINGS: Cardiovascular: Top-normal heart size. Trace pericardial effusion/thickening. Atherosclerotic nonaneurysmal thoracic aorta. Top-normal caliber main pulmonary artery (3.4 cm diameter). No central pulmonary emboli. Mediastinum/Nodes: No discrete thyroid nodules. Unremarkable esophagus. No axillary adenopathy. Enlarged 1.3 cm left supraclavicular node (series 2/image 19). Enlarged 1.3 cm right paratracheal node (series 2/image 40). Enlarged 1.3 cm subcarinal node (series 2/image 64). Infiltrative 6.4 x 4.7 cm soft tissue mass (series 2/image 53) centered in the aortopulmonary window, contiguous with the upper left hilum, encasing the left main and left upper and left lower bronchi and extending to the carina. No right hilar adenopathy. Lungs/Pleura: No pneumothorax. Trace dependent left pleural effusion. No right pleural effusion. Multiple poorly marginated sub solid left upper lobe lung masses and pulmonary  nodules, largest 4.6 x 3.3 cm posteriorly (series 7/image 50). Additional representative 2.4 cm sub solid apical left upper lobe nodule (series 7/image 34). Clustered sub solid right middle lobe nodules, largest 8 mm (series 7/image 87). Subsegmental right lung base atelectasis. Upper  abdomen: Posterior left perinephric 0.8 cm soft tissue nodule (series 2/image 139). Musculoskeletal: Faint sclerotic posterior T10 vertebral lesion (series 6/image 89). IMPRESSION: 1. Infiltrative 6.4 x 4.7 cm soft tissue mass centered in the AP window, contiguous with the upper left hilum, narrowing the central left lung airways, extending to the carina. Multiple subsolid lung masses and pulmonary nodules in the left upper lobe. Findings are most compatible with primary bronchogenic carcinoma, with the appearance favoring small cell lung carcinoma. 2. Left supraclavicular, subcarinal and right paratracheal adenopathy, suspicious for nodal metastatic disease. The left supraclavicular adenopathy may be amenable to ultrasound-guided percutaneous biopsy for diagnostic purposes. 3. Faint sclerotic posterior T10 vertebral lesion, cannot exclude osseous metastasis. 4. Small soft tissue nodule in the posterior left retroperitoneum, can not exclude soft tissue metastasis. 5. Suggest PET-CT for further staging evaluation. Suggest multidisciplinary thoracic oncology consultation. 6. Trace pericardial effusion/thickening. Aortic Atherosclerosis (ICD10-I70.0). Electronically Signed   By: Ilona Sorrel M.D.   On: 02/14/2019 19:15   Mr Thoracic Spine Wo Contrast  Result Date: 03/12/2019 CLINICAL DATA:  Is non-small cell lung cancer. Adenocarcinoma of the left lung, stage IV. The examination had to be discontinued prior to completion due to patient refusing additional imaging after sagittal T1 and sagittal IR images completed. EXAM: MRI THORACIC SPINE WITHOUT CONTRAST TECHNIQUE: Multiplanar, multisequence MR imaging of the thoracic spine was  performed. No intravenous contrast was administered. COMPARISON:  CT of the chest 02/14/2019 FINDINGS: Alignment:  AP alignment is anatomic. Vertebrae: A rounded T2 hyperintense and T1 hypointense lesion at T10 measures up to 21 mm. This is contained within the vertebral body. No other focal osseous lesions are present. Cord:  Normal signal is present throughout the thoracic spinal cord. Paraspinal and other soft tissues: Visualized paraspinous soft tissues are within normal limits. Disc levels: No significant disc disease is present. IMPRESSION: 1. 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 2. No significant disc disease or stenosis. Electronically Signed   By: San Morelle M.D.   On: 03/12/2019 21:47   [place] Core Biopsy (lymph Nodes)  Result Date: 02/25/2019 INDICATION: 58 year old with abnormal mediastinal soft tissue and left upper lung lesions. Findings are concerning for primary lung cancer. Patient also has suspicious left supraclavicular lymph nodes. Tissue diagnosis is needed. EXAM: ULTRASOUND-GUIDED LEFT SUPRACLAVICULAR LYMPH NODE BIOPSY MEDICATIONS: None. ANESTHESIA/SEDATION: Moderate (conscious) sedation was employed during this procedure. A total of Versed 2.0 mg and Fentanyl 100 mcg was administered intravenously. Moderate Sedation Time: 16 minutes minutes. The patient's level of consciousness and vital signs were monitored continuously by radiology nursing throughout the procedure under my direct supervision. FLUOROSCOPY TIME:  None COMPLICATIONS: None immediate. PROCEDURE: Informed written consent was obtained from the patient after a thorough discussion of the procedural risks, benefits and alternatives. All questions were addressed. A timeout was performed prior to the initiation of the procedure. Ultrasound was used to identify small irregular lymph nodes in the left supraclavicular region. Largest lymph node was targeted for biopsy. The neck was  prepped with chlorhexidine and sterile field was created. Skin was anesthetized with 1% lidocaine. Core biopsies were obtained of the left supraclavicular lymph node with an 18 gauge core device. Specimens placed in saline. Bandage placed over the puncture site. FINDINGS: Small irregular hypoechoic lymph nodes in left supraclavicular region. Largest lymph node measured 1.3 x 0.9 x 1.4 cm. This lymph node was biopsied. No significant bleeding or hematoma formation following the core biopsies. IMPRESSION: Ultrasound-guided core biopsies of a  left supraclavicular lymph node. Electronically Signed   By: Markus Daft M.D.   On: 02/25/2019 15:39

## 2019-03-15 NOTE — Progress Notes (Signed)
Oncology Nurse Navigator Documentation  Oncology Nurse Navigator Flowsheets 03/15/2019  Navigator Location [facility]  Navigator Encounter Type Other/I contacted Darlen[facility] with authorization to help get Mr. Tolen CT Abd/Pelvis authorized.   Telephone -  Treatment Phase Pre-Tx/Tx Discussion  Barriers/Navigation Needs Coordination of Care  Education -  Interventions Coordination of Care  Coordination of Care Other  Education Method -  Acuity Level 1  Time Spent with Patient 15

## 2019-03-17 ENCOUNTER — Telehealth: Payer: Self-pay | Admitting: Medical Oncology

## 2019-03-17 NOTE — Telephone Encounter (Signed)
Case [id number] denied. They did not describe what was denied.

## 2019-03-18 ENCOUNTER — Encounter (HOSPITAL_COMMUNITY): Payer: Self-pay | Admitting: Internal Medicine

## 2019-03-18 ENCOUNTER — Other Ambulatory Visit: Payer: Self-pay | Admitting: Medical Oncology

## 2019-03-18 ENCOUNTER — Inpatient Hospital Stay: Payer: Medicaid Other

## 2019-03-18 ENCOUNTER — Other Ambulatory Visit: Payer: Self-pay

## 2019-03-18 DIAGNOSIS — C3492 Malignant neoplasm of unspecified part of left bronchus or lung: Secondary | ICD-10-CM

## 2019-03-18 DIAGNOSIS — Z5112 Encounter for antineoplastic immunotherapy: Secondary | ICD-10-CM | POA: Diagnosis not present

## 2019-03-18 DIAGNOSIS — C3490 Malignant neoplasm of unspecified part of unspecified bronchus or lung: Secondary | ICD-10-CM

## 2019-03-18 LAB — CBC WITH DIFFERENTIAL (CANCER CENTER ONLY)
Abs Immature Granulocytes: 0.02 10*3/uL (ref 0.00–0.07)
BASOS ABS: 0 10*3/uL (ref 0.0–0.1)
Basophils Relative: 0 %
Eosinophils Absolute: 0.2 10*3/uL (ref 0.0–0.5)
Eosinophils Relative: 3 %
HCT: 35.1 % — ABNORMAL LOW (ref 39.0–52.0)
Hemoglobin: 11.4 g/dL — ABNORMAL LOW (ref 13.0–17.0)
Immature Granulocytes: 0 %
LYMPHS ABS: 2.1 10*3/uL (ref 0.7–4.0)
Lymphocytes Relative: 29 %
MCH: 28.9 pg (ref 26.0–34.0)
MCHC: 32.5 g/dL (ref 30.0–36.0)
MCV: 88.9 fL (ref 80.0–100.0)
Monocytes Absolute: 0.4 10*3/uL (ref 0.1–1.0)
Monocytes Relative: 5 %
Neutro Abs: 4.5 10*3/uL (ref 1.7–7.7)
Neutrophils Relative %: 63 %
Platelet Count: 406 10*3/uL — ABNORMAL HIGH (ref 150–400)
RBC: 3.95 MIL/uL — ABNORMAL LOW (ref 4.22–5.81)
RDW: 13.5 % (ref 11.5–15.5)
WBC Count: 7.2 10*3/uL (ref 4.0–10.5)
nRBC: 0 % (ref 0.0–0.2)

## 2019-03-18 LAB — CMP (CANCER CENTER ONLY)
ALT: 22 U/L (ref 0–44)
AST: 27 U/L (ref 15–41)
Albumin: 3.7 g/dL (ref 3.5–5.0)
Alkaline Phosphatase: 132 U/L — ABNORMAL HIGH (ref 38–126)
Anion gap: 11 (ref 5–15)
BUN: 20 mg/dL (ref 6–20)
CO2: 27 mmol/L (ref 22–32)
Calcium: 9.5 mg/dL (ref 8.9–10.3)
Chloride: 91 mmol/L — ABNORMAL LOW (ref 98–111)
Creatinine: 0.78 mg/dL (ref 0.61–1.24)
GFR, Est AFR Am: >60 mL/min (ref >60)
GFR, Estimated: >60 mL/min (ref >60)
Glucose, Bld: 114 mg/dL — ABNORMAL HIGH (ref 70–99)
Potassium: 3.8 mmol/L (ref 3.5–5.1)
SODIUM: 129 mmol/L — AB (ref 135–145)
Total Bilirubin: 0.5 mg/dL (ref 0.3–1.2)
Total Protein: 8.3 g/dL — ABNORMAL HIGH (ref 6.5–8.1)

## 2019-03-22 ENCOUNTER — Telehealth: Payer: Self-pay | Admitting: Medical Oncology

## 2019-03-22 ENCOUNTER — Other Ambulatory Visit: Payer: Self-pay | Admitting: Medical Oncology

## 2019-03-22 ENCOUNTER — Other Ambulatory Visit: Payer: Self-pay

## 2019-03-22 ENCOUNTER — Ambulatory Visit: Payer: Medicaid Other

## 2019-03-22 ENCOUNTER — Ambulatory Visit (HOSPITAL_COMMUNITY)
Admission: RE | Admit: 2019-03-22 | Discharge: 2019-03-22 | Disposition: A | Discharge status: Home / Self Care | Payer: Medicaid Other | Source: Ambulatory Visit | Attending: Internal Medicine | Admitting: Internal Medicine

## 2019-03-22 DIAGNOSIS — C3490 Malignant neoplasm of unspecified part of unspecified bronchus or lung: Secondary | ICD-10-CM | POA: Diagnosis present

## 2019-03-22 DIAGNOSIS — Z8659 Personal history of other mental and behavioral disorders: Secondary | ICD-10-CM

## 2019-03-22 MED ORDER — GADOBUTROL 1 MMOL/ML IV SOLN
10.0000 mL | Freq: Once | INTRAVENOUS | Status: AC | PRN
  Administered 2019-03-22: 10 mL via INTRAVENOUS

## 2019-03-22 MED ORDER — LORAZEPAM 1 MG PO TABS: 1.0000 mg | ORAL_TABLET | Freq: Once | ORAL | 0 refills | Status: AC

## 2019-03-22 NOTE — Telephone Encounter (Signed)
Pt states he is claustrophobic. Tivan ordered prior to MRI

## 2019-03-25 ENCOUNTER — Other Ambulatory Visit: Payer: Self-pay | Admitting: Internal Medicine

## 2019-03-25 ENCOUNTER — Other Ambulatory Visit: Payer: Self-pay

## 2019-03-25 ENCOUNTER — Telehealth: Payer: Self-pay | Admitting: Medical Oncology

## 2019-03-25 ENCOUNTER — Inpatient Hospital Stay: Payer: Medicaid Other | Attending: Internal Medicine

## 2019-03-25 DIAGNOSIS — Z5112 Encounter for antineoplastic immunotherapy: Secondary | ICD-10-CM | POA: Diagnosis present

## 2019-03-25 DIAGNOSIS — C3492 Malignant neoplasm of unspecified part of left bronchus or lung: Secondary | ICD-10-CM

## 2019-03-25 DIAGNOSIS — Z79899 Other long term (current) drug therapy: Secondary | ICD-10-CM | POA: Diagnosis not present

## 2019-03-25 DIAGNOSIS — C3412 Malignant neoplasm of upper lobe, left bronchus or lung: Secondary | ICD-10-CM | POA: Insufficient documentation

## 2019-03-25 DIAGNOSIS — M25511 Pain in right shoulder: Secondary | ICD-10-CM | POA: Diagnosis not present

## 2019-03-25 DIAGNOSIS — R110 Nausea: Secondary | ICD-10-CM | POA: Diagnosis not present

## 2019-03-25 DIAGNOSIS — C7951 Secondary malignant neoplasm of bone: Secondary | ICD-10-CM | POA: Insufficient documentation

## 2019-03-25 DIAGNOSIS — Z5111 Encounter for antineoplastic chemotherapy: Secondary | ICD-10-CM | POA: Diagnosis present

## 2019-03-25 DIAGNOSIS — R05 Cough: Secondary | ICD-10-CM | POA: Diagnosis not present

## 2019-03-25 DIAGNOSIS — K869 Disease of pancreas, unspecified: Secondary | ICD-10-CM | POA: Diagnosis not present

## 2019-03-25 LAB — CBC WITH DIFFERENTIAL (CANCER CENTER ONLY)
Abs Immature Granulocytes: 0.02 10*3/uL (ref 0.00–0.07)
Basophils Absolute: 0 10*3/uL (ref 0.0–0.1)
Basophils Relative: 0 %
Eosinophils Absolute: 0.1 10*3/uL (ref 0.0–0.5)
Eosinophils Relative: 2 %
HCT: 34.2 % — ABNORMAL LOW (ref 39.0–52.0)
Hemoglobin: 10.9 g/dL — ABNORMAL LOW (ref 13.0–17.0)
Immature Granulocytes: 0 %
Lymphocytes Relative: 21 %
Lymphs Abs: 1.3 10*3/uL (ref 0.7–4.0)
MCH: 28.8 pg (ref 26.0–34.0)
MCHC: 31.9 g/dL (ref 30.0–36.0)
MCV: 90.2 fL (ref 80.0–100.0)
Monocytes Absolute: 1 10*3/uL (ref 0.1–1.0)
Monocytes Relative: 16 %
Neutro Abs: 4 10*3/uL (ref 1.7–7.7)
Neutrophils Relative %: 61 %
Platelet Count: 309 10*3/uL (ref 150–400)
RBC: 3.79 MIL/uL — ABNORMAL LOW (ref 4.22–5.81)
RDW: 13.9 % (ref 11.5–15.5)
WBC Count: 6.5 10*3/uL (ref 4.0–10.5)
nRBC: 0 % (ref 0.0–0.2)

## 2019-03-25 LAB — CMP (CANCER CENTER ONLY)
ALT: 26 U/L (ref 0–44)
AST: 21 U/L (ref 15–41)
Albumin: 3.8 g/dL (ref 3.5–5.0)
Alkaline Phosphatase: 149 U/L — ABNORMAL HIGH (ref 38–126)
Anion gap: 10 (ref 5–15)
BUN: 10 mg/dL (ref 6–20)
CO2: 28 mmol/L (ref 22–32)
Calcium: 9.4 mg/dL (ref 8.9–10.3)
Chloride: 94 mmol/L — ABNORMAL LOW (ref 98–111)
Creatinine: 0.72 mg/dL (ref 0.61–1.24)
GFR, Est AFR Am: >60 mL/min (ref >60)
GFR, Estimated: >60 mL/min (ref >60)
Glucose, Bld: 102 mg/dL — ABNORMAL HIGH (ref 70–99)
Potassium: 4.2 mmol/L (ref 3.5–5.1)
Sodium: 132 mmol/L — ABNORMAL LOW (ref 135–145)
Total Bilirubin: 0.4 mg/dL (ref 0.3–1.2)
Total Protein: 8 g/dL (ref 6.5–8.1)

## 2019-03-25 MED ORDER — OXYCODONE-ACETAMINOPHEN 5-325 MG PO TABS: 1.0000 | ORAL_TABLET | Freq: Three times a day (TID) | ORAL | 0 refills | Status: DC | PRN

## 2019-03-25 NOTE — Telephone Encounter (Signed)
Results of recent  MRI brain and spine

## 2019-03-25 NOTE — Telephone Encounter (Signed)
Increased neck pain-"feels like pus built up near my shoulder and coughing up yellow phlegm . Denies fever. Requests refill pain med.

## 2019-03-25 NOTE — Telephone Encounter (Signed)
MRI of the brain was fine.  MRI of the thoracic spine confirmed the spot in T10 likely cancer.  Will discuss with him next week when he comes for the visit.  No change in his plan.

## 2019-03-25 NOTE — Telephone Encounter (Signed)
I did refill his pain medicine.  He can use Delsym over-the-counter for cough.

## 2019-03-31 ENCOUNTER — Encounter (HOSPITAL_COMMUNITY): Payer: Self-pay

## 2019-03-31 ENCOUNTER — Other Ambulatory Visit: Payer: Self-pay

## 2019-03-31 ENCOUNTER — Ambulatory Visit (HOSPITAL_COMMUNITY)
Admission: RE | Admit: 2019-03-31 | Discharge: 2019-03-31 | Disposition: A | Discharge status: Home / Self Care | Payer: Medicaid Other | Source: Ambulatory Visit | Attending: Internal Medicine | Admitting: Internal Medicine

## 2019-03-31 DIAGNOSIS — C3490 Malignant neoplasm of unspecified part of unspecified bronchus or lung: Secondary | ICD-10-CM | POA: Diagnosis present

## 2019-03-31 HISTORY — DX: Malignant (primary) neoplasm, unspecified: C80.1

## 2019-03-31 MED ORDER — SODIUM CHLORIDE (PF) 0.9 % IJ SOLN
INTRAMUSCULAR | Status: AC
  Filled 2019-03-31: qty 50

## 2019-03-31 MED ORDER — IOHEXOL 300 MG/ML  SOLN
100.0000 mL | Freq: Once | INTRAMUSCULAR | Status: AC | PRN
  Administered 2019-03-31: 100 mL via INTRAVENOUS

## 2019-04-01 ENCOUNTER — Other Ambulatory Visit: Payer: Self-pay

## 2019-04-01 ENCOUNTER — Encounter: Payer: Self-pay | Admitting: Physician Assistant

## 2019-04-01 ENCOUNTER — Inpatient Hospital Stay: Payer: Medicaid Other

## 2019-04-01 ENCOUNTER — Ambulatory Visit: Payer: Medicaid Other

## 2019-04-01 ENCOUNTER — Inpatient Hospital Stay (HOSPITAL_BASED_OUTPATIENT_CLINIC_OR_DEPARTMENT_OTHER): Payer: Medicaid Other | Admitting: Physician Assistant

## 2019-04-01 VITALS — HR 99

## 2019-04-01 VITALS — BP 127/95 | HR 112 | Temp 98.2°F | Resp 18 | Ht 67.0 in | Wt 220.2 lb

## 2019-04-01 DIAGNOSIS — C3492 Malignant neoplasm of unspecified part of left bronchus or lung: Secondary | ICD-10-CM

## 2019-04-01 DIAGNOSIS — M542 Cervicalgia: Secondary | ICD-10-CM

## 2019-04-01 DIAGNOSIS — Z5112 Encounter for antineoplastic immunotherapy: Secondary | ICD-10-CM | POA: Diagnosis not present

## 2019-04-01 DIAGNOSIS — R110 Nausea: Secondary | ICD-10-CM | POA: Diagnosis not present

## 2019-04-01 DIAGNOSIS — Z5111 Encounter for antineoplastic chemotherapy: Secondary | ICD-10-CM

## 2019-04-01 DIAGNOSIS — K869 Disease of pancreas, unspecified: Secondary | ICD-10-CM

## 2019-04-01 DIAGNOSIS — R05 Cough: Secondary | ICD-10-CM | POA: Diagnosis not present

## 2019-04-01 DIAGNOSIS — Z79899 Other long term (current) drug therapy: Secondary | ICD-10-CM

## 2019-04-01 DIAGNOSIS — C3412 Malignant neoplasm of upper lobe, left bronchus or lung: Secondary | ICD-10-CM

## 2019-04-01 DIAGNOSIS — C7951 Secondary malignant neoplasm of bone: Secondary | ICD-10-CM

## 2019-04-01 DIAGNOSIS — M25511 Pain in right shoulder: Secondary | ICD-10-CM

## 2019-04-01 LAB — CMP (CANCER CENTER ONLY)
ALT: 23 U/L (ref 10–47)
AST: 26 U/L (ref 11–38)
Albumin: 3.8 g/dL (ref 3.5–5.0)
Alkaline Phosphatase: 164 U/L — ABNORMAL HIGH (ref 38–126)
Anion gap: 12 (ref 5–15)
BUN: 6 mg/dL (ref 6–20)
CO2: 24 mmol/L (ref 22–32)
Calcium: 9.6 mg/dL (ref 8.9–10.3)
Chloride: 98 mmol/L (ref 98–111)
Creatinine: 0.73 mg/dL (ref 0.60–1.20)
GFR, Est AFR Am: >60 mL/min (ref >60)
GFR, Estimated: >60 mL/min (ref >60)
Glucose, Bld: 96 mg/dL (ref 70–99)
Potassium: 4.1 mmol/L (ref 3.5–5.1)
Sodium: 134 mmol/L — ABNORMAL LOW (ref 135–145)
Total Bilirubin: 0.3 mg/dL (ref 0.2–1.6)
Total Protein: 8.3 g/dL — ABNORMAL HIGH (ref 6.5–8.1)

## 2019-04-01 LAB — CBC WITH DIFFERENTIAL (CANCER CENTER ONLY)
Abs Immature Granulocytes: 0.01 10*3/uL (ref 0.00–0.07)
Basophils Absolute: 0 10*3/uL (ref 0.0–0.1)
Basophils Relative: 0 %
Eosinophils Absolute: 0.2 10*3/uL (ref 0.0–0.5)
Eosinophils Relative: 2 %
HCT: 37.5 % — ABNORMAL LOW (ref 39.0–52.0)
Hemoglobin: 11.7 g/dL — ABNORMAL LOW (ref 13.0–17.0)
Immature Granulocytes: 0 %
Lymphocytes Relative: 29 %
Lymphs Abs: 2.2 10*3/uL (ref 0.7–4.0)
MCH: 28.4 pg (ref 26.0–34.0)
MCHC: 31.2 g/dL (ref 30.0–36.0)
MCV: 91 fL (ref 80.0–100.0)
Monocytes Absolute: 1 10*3/uL (ref 0.1–1.0)
Monocytes Relative: 13 %
Neutro Abs: 4.3 10*3/uL (ref 1.7–7.7)
Neutrophils Relative %: 56 %
Platelet Count: 361 10*3/uL (ref 150–400)
RBC: 4.12 MIL/uL — ABNORMAL LOW (ref 4.22–5.81)
RDW: 14.4 % (ref 11.5–15.5)
WBC Count: 7.6 10*3/uL (ref 4.0–10.5)
nRBC: 0 % (ref 0.0–0.2)

## 2019-04-01 LAB — TSH: TSH: 0.939 u[IU]/mL (ref 0.320–4.118)

## 2019-04-01 MED ORDER — SODIUM CHLORIDE 0.9 % IV SOLN
500.0000 mg/m2 | Freq: Once | INTRAVENOUS | Status: AC
  Administered 2019-04-01: 1100 mg via INTRAVENOUS
  Filled 2019-04-01: qty 40

## 2019-04-01 MED ORDER — SODIUM CHLORIDE 0.9 % IV SOLN
200.0000 mg | Freq: Once | INTRAVENOUS | Status: AC
  Administered 2019-04-01: 200 mg via INTRAVENOUS
  Filled 2019-04-01: qty 8

## 2019-04-01 MED ORDER — SODIUM CHLORIDE 0.9 % IV SOLN
750.0000 mg | Freq: Once | INTRAVENOUS | Status: AC
  Administered 2019-04-01: 750 mg via INTRAVENOUS
  Filled 2019-04-01: qty 75

## 2019-04-01 MED ORDER — PALONOSETRON HCL INJECTION 0.25 MG/5ML
INTRAVENOUS | Status: AC
  Filled 2019-04-01: qty 5

## 2019-04-01 MED ORDER — PALONOSETRON HCL INJECTION 0.25 MG/5ML
0.2500 mg | Freq: Once | INTRAVENOUS | Status: AC
  Administered 2019-04-01: 0.25 mg via INTRAVENOUS

## 2019-04-01 MED ORDER — SODIUM CHLORIDE 0.9 % IV SOLN
Freq: Once | INTRAVENOUS | Status: AC
  Administered 2019-04-01: 11:00:00 via INTRAVENOUS
  Filled 2019-04-01: qty 5

## 2019-04-01 MED ORDER — SODIUM CHLORIDE 0.9 % IV SOLN
Freq: Once | INTRAVENOUS | Status: AC
  Administered 2019-04-01: 10:00:00 via INTRAVENOUS
  Filled 2019-04-01: qty 250

## 2019-04-01 NOTE — Patient Instructions (Signed)
[facility] Discharge Instructions for Patients Receiving Chemotherapy  Today you received the following chemotherapy agents: Keytruda, Alimta, Carboplatin  To help prevent nausea and vomiting after your treatment, we encourage you to take your nausea medication as directed.   If you develop nausea and vomiting that is not controlled by your nausea medication, call the clinic.   BELOW ARE SYMPTOMS THAT SHOULD BE REPORTED IMMEDIATELY:  *FEVER GREATER THAN 100.5 F  *CHILLS WITH OR WITHOUT FEVER  NAUSEA AND VOMITING THAT IS NOT CONTROLLED WITH YOUR NAUSEA MEDICATION  *UNUSUAL SHORTNESS OF BREATH  *UNUSUAL BRUISING OR BLEEDING  TENDERNESS IN MOUTH AND THROAT WITH OR WITHOUT PRESENCE OF ULCERS  *URINARY PROBLEMS  *BOWEL PROBLEMS  UNUSUAL RASH Items with * indicate a potential emergency and should be followed up as soon as possible.  Feel free to call the clinic should you have any questions or concerns. The clinic phone number is [phone].  Please show the [facility] at check-in to the Emergency Department and triage nurse.

## 2019-04-01 NOTE — Progress Notes (Signed)
[facility] OFFICE PROGRESS NOTE  [name], MD [address] [place] [address]  DIAGNOSIS: Stage IV (2B, N3, M1 C) non-small cell lung cancer, adenocarcinoma presented with left upper lobe lung mass in addition to mediastinal and left supraclavicular lymphadenopathy as well as bilateral pulmonary nodules, metastatic disease to T10 vertebral body, and a suspicious    mass near the pancreatic tail diagnosed in March 2020.  Molecular Studies by [employer]:   PDL1 Expression:   PRIOR THERAPY: None  CURRENT THERAPY: Systemic chemotherapy with carboplatin for AUC of 5, Alimta 500 mg/M2 and Keytruda 200 mg IV every 3 weeks.  First dose March 12, 2019. Status post 1 cycle.   INTERVAL HISTORY: DOROTHY LANDGREBE 58 y.o. male returns to the clinic today for a follow-up visit.  The patient recently underwent his first cycle of treatment and tolerated it well except for some fatigue and nausea which was managed with Compazine.  The patient is feeling well today; however, he still continues to experience intermittent pain in his right shoulder.  He states that this has been going on for approximately 2 months or so and describes his pain as a non-radiating throbbing and burning.  The patient denies any associated triggers that exacerbates the pain. He has been taking percocet, ibuprofen, cymbalta, flexeril, and gabapentin with some relief. He denies any associated symptoms such as overlying skin changes, decreased range of motion in the extremity, weakness, numbness, or tingling in his right upper extremity. Otherwise, the patient is feeling well today.  He denies any recent fevers, chills, night sweats, or weight loss.  He denies any chest pain, shortness of breath, or hemoptysis.  He still endorses his baseline productive cough.  He denies any vomiting, diarrhea, abdominal pain, or constipation.  He denies any headache or visual changes.  He denies any rashes or skin changes.  He  recently had a brain MRI, MRI thoracic spine, and CT abdomen.  The patient's insurance did not cover an initial staging PET scan.  He is here today for evaluation and to discuss his scan results before starting cycle #2.   MEDICAL HISTORY: Past Medical History:  Diagnosis Date  . Bronchitis, chronic ([place])   . Hypertension   . metastatic lung ca dx'd 02/2019   lyphadenopathy, bil pulmonary noduels; suspected bone lesions    ALLERGIES:  has No Known Allergies.  MEDICATIONS:  Current Outpatient Medications  Medication Sig Dispense Refill  . aspirin 81 MG tablet Take 81 mg by mouth daily.    . cyclobenzaprine (FLEXERIL) 10 MG tablet Take 1 tablet (10 mg total) by mouth 3 (three) times daily as needed for muscle spasms. 30 tablet 0  . DULoxetine (CYMBALTA) 30 MG capsule 1 once daily for 1 week then increase to 1 twice daily 60 capsule 3  . folic acid (FOLVITE) 1 MG tablet Take 1 tablet (1 mg total) by mouth daily. 30 tablet 4  . gabapentin (NEURONTIN) 300 MG capsule Take 1 capsule (300 mg total) by mouth 3 (three) times daily. 90 capsule 3  . oxyCODONE-acetaminophen (PERCOCET/ROXICET) 5-325 MG tablet Take 1 tablet by mouth every 8 (eight) hours as needed for severe pain. 30 tablet 0  . prochlorperazine (COMPAZINE) 10 MG tablet Take 1 tablet (10 mg total) by mouth every 6 (six) hours as needed for nausea or vomiting. 30 tablet 0  . albuterol (PROVENTIL HFA;VENTOLIN HFA) 108 (90 BASE) MCG/ACT inhaler Inhale 2 puffs into the lungs every 6 (six)  hours as needed for wheezing. (Patient not taking: Reported on 03/03/2019) 1 Inhaler 2   No current facility-administered medications for this visit.    Facility-Administered Medications Ordered in Other Visits  Medication Dose Route Frequency Provider Last Rate Last Dose  . CARBOplatin (PARAPLATIN) 750 mg in sodium chloride 0.9 % 250 mL chemo infusion  750 mg Intravenous Once Curt Bears, MD      . fosaprepitant (EMEND) 150 mg, dexamethasone  (DECADRON) 12 mg in sodium chloride 0.9 % 145 mL IVPB   Intravenous Once Curt Bears, MD 454 mL/hr at 04/01/19 1056    . pembrolizumab (KEYTRUDA) 200 mg in sodium chloride 0.9 % 50 mL chemo infusion  200 mg Intravenous Once Curt Bears, MD      . PEMEtrexed (ALIMTA) 1,100 mg in sodium chloride 0.9 % 100 mL chemo infusion  500 mg/m2 (Treatment Plan Recorded) Intravenous Once Curt Bears, MD        SURGICAL HISTORY: History reviewed. No pertinent surgical history.  REVIEW OF SYSTEMS:   Review of Systems  Constitutional: Positive for fatigue. Negative for appetite change, chills, fever and unexpected weight change.  HENT:  Positive for raspy voice. Negative for mouth sores, nosebleeds, sore throat and trouble swallowing.   Eyes: Negative for eye problems and icterus.  Respiratory: Positive for productive cough. Negative for hemoptysis, shortness of breath and wheezing.   Cardiovascular: Negative for chest pain and leg swelling.  Gastrointestinal: Positive for nausea following treatment. Negative for abdominal pain, constipation, diarrhea, and vomiting.  Genitourinary: Negative for bladder incontinence, difficulty urinating, dysuria, frequency and hematuria.   Musculoskeletal: Positive for right shoulder pain. Negative for back pain, gait problem, and neck stiffness.  Skin: Negative for itching and rash.  Neurological: Negative for dizziness, extremity weakness, gait problem, headaches, light-headedness and seizures.  Hematological: Negative for adenopathy. Does not bruise/bleed easily.  Psychiatric/Behavioral: Negative for confusion, depression and sleep disturbance. The patient is not nervous/anxious.     PHYSICAL EXAMINATION:  Blood pressure (!) 127/95, pulse (!) 112, temperature 98.2 F (36.8 C), temperature source Oral, resp. rate 18, height _0  (1.702 m), weight 220 lb 3.2 oz (99.9 kg), SpO2 97 %.  ECOG PERFORMANCE STATUS: 1 - Symptomatic but completely  ambulatory  Physical Exam  Constitutional: Oriented to person, place, and time and well-developed, well-nourished, and in no distress. No distress.  HENT:  Head: Normocephalic and atraumatic.  Mouth/Throat: Oropharynx is clear and moist. No oropharyngeal exudate. Raspy voice.  Eyes: Conjunctivae are normal. Right eye exhibits no discharge. Left eye exhibits no discharge. No scleral icterus.  Neck: Normal range of motion. Neck supple.  Cardiovascular: Normal rate, regular rhythm, normal heart sounds and intact distal pulses.   Pulmonary/Chest: Effort normal and breath sounds normal. No respiratory distress. No wheezes. No rales.  Abdominal: Soft. Bowel sounds are normal. Exhibits no distension and no mass. There is no tenderness.  Musculoskeletal: Normal range of motion. Exhibits no edema.  Lymphadenopathy:    Left supraclavicular lymphadenopathy noted.   Neurological: Alert and oriented to person, place, and time. Exhibits normal muscle tone. Gait normal. Coordination normal.  Skin: Skin is warm and dry. No rash noted. Not diaphoretic. No erythema. No pallor.  Psychiatric: Mood, memory and judgment normal.  Vitals reviewed.  LABORATORY DATA: Lab Results  Component Value Date   WBC 7.6 04/01/2019   HGB 11.7 (L) 04/01/2019   HCT 37.5 (L) 04/01/2019   MCV 91.0 04/01/2019   PLT 361 04/01/2019      Chemistry  Component Value Date/Time   NA 134 (L) 04/01/2019 0833   K 4.1 04/01/2019 0833   CL 98 04/01/2019 0833   CO2 24 04/01/2019 0833   BUN 6 04/01/2019 0833   CREATININE 0.73 04/01/2019 0833      Component Value Date/Time   CALCIUM 9.6 04/01/2019 0833   ALKPHOS 164 (H) 04/01/2019 0833   AST 26 04/01/2019 0833   ALT 23 04/01/2019 0833   BILITOT 0.3 04/01/2019 0833       RADIOGRAPHIC STUDIES:  Ct Abdomen W Contrast  Result Date: 03/31/2019 CLINICAL DATA:  Stage IV non-small lung cancer diagnosed last month. Chemotherapy and immunotherapy in progress. EXAM: CT ABDOMEN  WITH CONTRAST TECHNIQUE: Multidetector CT imaging of the abdomen was performed using the standard protocol following bolus administration of intravenous contrast. CONTRAST:  124m OMNIPAQUE IOHEXOL 300 MG/ML  SOLN COMPARISON:  Chest CT 02/14/2019.  Thoracic MRI 03/12/2019. FINDINGS: Lower chest: Subcarinal adenopathy is partially imaged on image 1/2, not progressive from the prior study. The overall aeration of the lung bases has improved. There is residual patchy airspace disease within the lingula. No significant pleural effusion. Hepatobiliary: The liver is normal in density without focal abnormality. No evidence of gallstones, gallbladder wall thickening or biliary dilatation. Pancreas: There is an ill-defined low-density mass in the pancreatic tail measuring 1.6 x 1.3 cm on image 30/2. This area was previously poorly evaluated due to motion on the prior examination. There is no surrounding inflammation or pancreatic ductal dilatation. Spleen: Normal in size without focal abnormality. Adrenals/Urinary Tract: Both adrenal glands appear normal. The kidneys and visualized ureters appear normal. The previously described small left posterior perinephric soft tissue nodule at has nearly completely resolved (image 29/2). Stomach/Bowel: No evidence of bowel wall thickening, distention or surrounding inflammatory change. Vascular/Lymphatic: There are no enlarged abdominal lymph nodes. There are small lymph nodes in the porta hepatis and retroperitoneum. There is minimal aortic and branch vessel atherosclerosis. Other: No ascites or peritoneal nodularity. The visualized anterior abdominal wall is intact. Musculoskeletal: Sclerotic lesion in the T10 vertebral body again noted without significant change. No other osseous abnormalities identified. IMPRESSION: 1. Small mass in the pancreatic tail, questionably present on prior chest CT. This is suspicious for metastatic disease. Differential includes primary pancreatic  malignancy. Focal inflammation considered less likely given lack of surrounding inflammatory change. 2. Stable sclerotic lesion in the T10 vertebral body consistent with a metastasis on MRI. 3. No other evidence of metastatic disease in the upper abdomen. Previously noted small posterior perinephric soft tissue nodule in the left has nearly completely resolved. 4. Interval slight improved aeration of the lung bases. Electronically Signed   By: WRichardean SaleM.D.   On: 03/31/2019 11:01   Mr BJeri CosWPQContrast  Result Date: 03/22/2019 CLINICAL DATA:  Lung cancer staging EXAM: MRI HEAD WITHOUT AND WITH CONTRAST TECHNIQUE: Multiplanar, multiecho pulse sequences of the brain and surrounding structures were obtained without and with intravenous contrast. CONTRAST:  10 mL Gadavist COMPARISON:  Head CT 10/19/2009 FINDINGS: BRAIN: There is no acute infarct, acute hemorrhage or extra-axial collection. The midline structures are normal. No midline shift or other mass effect. Multifocal white matter hyperintensity, most commonly due to chronic ischemic microangiopathy. The cerebral and cerebellar volume are age-appropriate. No hydrocephalus. Susceptibility-sensitive sequences show no chronic microhemorrhage or superficial siderosis. No mass lesion. VASCULAR: The major intracranial arterial and venous sinus flow voids are normal. SKULL AND UPPER CERVICAL SPINE: Calvarial bone marrow signal is normal. There is no skull base  mass. Visualized upper cervical spine and soft tissues are normal. SINUSES/ORBITS: No fluid levels or advanced mucosal thickening. No mastoid or middle ear effusion. The orbits are normal. IMPRESSION: 1. No intracranial metastatic disease. 2. Early findings of chronic ischemic microangiopathy. Electronically Signed   By: Ulyses Jarred M.D.   On: 03/22/2019 17:04   Mr Thoracic Spine Wo Contrast  Result Date: 03/12/2019 CLINICAL DATA:  Is non-small cell lung cancer. Adenocarcinoma of the left lung,  stage IV. The examination had to be discontinued prior to completion due to patient refusing additional imaging after sagittal T1 and sagittal IR images completed. EXAM: MRI THORACIC SPINE WITHOUT CONTRAST TECHNIQUE: Multiplanar, multisequence MR imaging of the thoracic spine was performed. No intravenous contrast was administered. COMPARISON:  CT of the chest 02/14/2019 FINDINGS: Alignment:  AP alignment is anatomic. Vertebrae: A rounded T2 hyperintense and T1 hypointense lesion at T10 measures up to 21 mm. This is contained within the vertebral body. No other focal osseous lesions are present. Cord:  Normal signal is present throughout the thoracic spinal cord. Paraspinal and other soft tissues: Visualized paraspinous soft tissues are within normal limits. Disc levels: No significant disc disease is present. IMPRESSION: 1. 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 2. No significant disc disease or stenosis. Electronically Signed   By: San Morelle M.D.   On: 03/12/2019 21:47     ASSESSMENT/PLAN:  This is a very pleasant 58 year old African-American male with stage IV non-small cell lung cancer, adenocarcinoma.  He presented with a large left upper lobe lung mass with mediastinal and left supraclavicular lymphadenopathy as well as bilateral pulmonary nodules.  He was also found to have metastatic disease to the T10 vertebral body.  He also has a suspicious mass in the tail of the pancreas.  His brain MRI was negative for any metastatic disease.  He was diagnosed in March 2020.   The patient is currently undergoing treatment with carboplatin for an AUC of 5, Alimta 500 mg/m, and Keytruda 200 mg IV every 3 weeks.  He is status post 1 cycle.  He tolerated it well except for some nausea and fatigue following his treatment.  The patient was seen with Dr. Julien Nordmann today.  The patient recently had imaging with an MRI thoracic spine, MRI brain, and CT abdomen to  complete the staging work-up, as his insurance did not approve of his initial staging PET scan.  Dr. Julien Nordmann personally and independently reviewed the scan and discussed the results with the patient today.  The scan showed the presence of a small mass in the pancreatic tail which is suspicious for metastatic disease. The MRI thoracic spine also confirmed the presence of metastatic disease to the T10 vertebral body. There was no other evidence of metastatic disease.   We recommend the patient proceed with cycle #2 today of his chemotherapy as scheduled. We will see the patient back in 3 weeks for evaluation prior to starting cycle #3 of his treatment.   For the patient's cough, we recommend the patient obtain over-the-counter cough medicine such as delsym.  For nausea, the patient will continue taking Compazine.   Regarding the patient's neck pain, recent imaging studies did not show any abnormalities to explain his right shoulder pain. The patient will continue on his current pain regimen for now.   The patient was advised to call immediately if he has any concerning symptoms in the interval. The patient voices understanding of current disease status and treatment  options and is in agreement with the current care plan. All questions were answered. The patient knows to call the clinic with any problems, questions or concerns. We can certainly see the patient much sooner if necessary   No orders of the defined types were placed in this encounter.    Austina Constantin L Estrellita Lasky, PA-C 04/01/19  ADDENDUM: Hematology/Oncology Attending: I had a face-to-face encounter with the patient today.  I recommended his care plan.  This is a very pleasant 58 years old African-American male with a stage IV non-small cell lung cancer, adenocarcinoma.  The patient is currently undergoing systemic chemotherapy with carboplatin, Alimta and will be status post 1 cycle.  He tolerated the first cycle of this treatment  well with no significant adverse effects except for mild fatigue.  He had CT scan of the abdomen and pelvis performed recently to complete the staging work-up and it showed metastatic lesion in the pancreas.  MRI of the thoracic spine confirmed the metastatic disease at the T10 vertebral body. I recommended for the patient to proceed with cycle #2 today as scheduled. I will see the patient back for follow-up visit in 3 weeks for evaluation before the next cycle of his treatment. He was advised to call immediately if he has any concerning symptoms in the interval.  Disclaimer: This note was dictated with voice recognition software. Similar sounding words can inadvertently be transcribed and may be missed upon review. Eilleen Kempf, MD 04/01/19

## 2019-04-06 ENCOUNTER — Other Ambulatory Visit: Payer: Self-pay | Admitting: Medical

## 2019-04-08 ENCOUNTER — Other Ambulatory Visit: Payer: Self-pay

## 2019-04-08 ENCOUNTER — Inpatient Hospital Stay: Payer: Medicaid Other

## 2019-04-08 DIAGNOSIS — Z5112 Encounter for antineoplastic immunotherapy: Secondary | ICD-10-CM | POA: Diagnosis not present

## 2019-04-08 DIAGNOSIS — C3492 Malignant neoplasm of unspecified part of left bronchus or lung: Secondary | ICD-10-CM

## 2019-04-08 LAB — CBC WITH DIFFERENTIAL (CANCER CENTER ONLY)
Abs Immature Granulocytes: 0.01 10*3/uL (ref 0.00–0.07)
Basophils Absolute: 0 10*3/uL (ref 0.0–0.1)
Basophils Relative: 0 %
Eosinophils Absolute: 0.1 10*3/uL (ref 0.0–0.5)
Eosinophils Relative: 2 %
HCT: 35.3 % — ABNORMAL LOW (ref 39.0–52.0)
Hemoglobin: 11.3 g/dL — ABNORMAL LOW (ref 13.0–17.0)
Immature Granulocytes: 0 %
Lymphocytes Relative: 44 %
Lymphs Abs: 1.6 10*3/uL (ref 0.7–4.0)
MCH: 28.3 pg (ref 26.0–34.0)
MCHC: 32 g/dL (ref 30.0–36.0)
MCV: 88.5 fL (ref 80.0–100.0)
Monocytes Absolute: 0.5 10*3/uL (ref 0.1–1.0)
Monocytes Relative: 14 %
Neutro Abs: 1.4 10*3/uL — ABNORMAL LOW (ref 1.7–7.7)
Neutrophils Relative %: 40 %
Platelet Count: 234 10*3/uL (ref 150–400)
RBC: 3.99 MIL/uL — ABNORMAL LOW (ref 4.22–5.81)
RDW: 13.5 % (ref 11.5–15.5)
WBC Count: 3.6 10*3/uL — ABNORMAL LOW (ref 4.0–10.5)
nRBC: 0 % (ref 0.0–0.2)

## 2019-04-08 LAB — CMP (CANCER CENTER ONLY)
ALT: 25 U/L (ref 0–44)
AST: 35 U/L (ref 15–41)
Albumin: 3.7 g/dL (ref 3.5–5.0)
Alkaline Phosphatase: 154 U/L — ABNORMAL HIGH (ref 38–126)
Anion gap: 12 (ref 5–15)
BUN: 6 mg/dL (ref 6–20)
CO2: 25 mmol/L (ref 22–32)
Calcium: 9 mg/dL (ref 8.9–10.3)
Chloride: 96 mmol/L — ABNORMAL LOW (ref 98–111)
Creatinine: 0.67 mg/dL (ref 0.61–1.24)
GFR, Est AFR Am: >60 mL/min (ref >60)
GFR, Estimated: >60 mL/min (ref >60)
Glucose, Bld: 88 mg/dL (ref 70–99)
Potassium: 3.4 mmol/L — ABNORMAL LOW (ref 3.5–5.1)
Sodium: 133 mmol/L — ABNORMAL LOW (ref 135–145)
Total Bilirubin: 0.4 mg/dL (ref 0.3–1.2)
Total Protein: 7.8 g/dL (ref 6.5–8.1)

## 2019-04-12 NOTE — Telephone Encounter (Signed)
Dr. Julien Nordmann advise refill.

## 2019-04-15 ENCOUNTER — Inpatient Hospital Stay: Payer: Medicaid Other

## 2019-04-15 ENCOUNTER — Other Ambulatory Visit: Payer: Self-pay

## 2019-04-15 DIAGNOSIS — C3492 Malignant neoplasm of unspecified part of left bronchus or lung: Secondary | ICD-10-CM

## 2019-04-15 DIAGNOSIS — Z5112 Encounter for antineoplastic immunotherapy: Secondary | ICD-10-CM | POA: Diagnosis not present

## 2019-04-15 LAB — CBC WITH DIFFERENTIAL (CANCER CENTER ONLY)
Abs Immature Granulocytes: 0.01 10*3/uL (ref 0.00–0.07)
Basophils Absolute: 0 10*3/uL (ref 0.0–0.1)
Basophils Relative: 0 %
Eosinophils Absolute: 0.1 10*3/uL (ref 0.0–0.5)
Eosinophils Relative: 1 %
HCT: 36 % — ABNORMAL LOW (ref 39.0–52.0)
Hemoglobin: 11.5 g/dL — ABNORMAL LOW (ref 13.0–17.0)
Immature Granulocytes: 0 %
Lymphocytes Relative: 40 %
Lymphs Abs: 2.5 10*3/uL (ref 0.7–4.0)
MCH: 28.5 pg (ref 26.0–34.0)
MCHC: 31.9 g/dL (ref 30.0–36.0)
MCV: 89.1 fL (ref 80.0–100.0)
Monocytes Absolute: 1 10*3/uL (ref 0.1–1.0)
Monocytes Relative: 16 %
Neutro Abs: 2.7 10*3/uL (ref 1.7–7.7)
Neutrophils Relative %: 43 %
Platelet Count: 228 10*3/uL (ref 150–400)
RBC: 4.04 MIL/uL — ABNORMAL LOW (ref 4.22–5.81)
RDW: 14.4 % (ref 11.5–15.5)
WBC Count: 6.2 10*3/uL (ref 4.0–10.5)
nRBC: 0 % (ref 0.0–0.2)

## 2019-04-15 LAB — CMP (CANCER CENTER ONLY)
ALT: 41 U/L (ref 0–44)
AST: 40 U/L (ref 15–41)
Albumin: 3.9 g/dL (ref 3.5–5.0)
Alkaline Phosphatase: 163 U/L — ABNORMAL HIGH (ref 38–126)
Anion gap: 12 (ref 5–15)
BUN: 8 mg/dL (ref 6–20)
CO2: 23 mmol/L (ref 22–32)
Calcium: 9.4 mg/dL (ref 8.9–10.3)
Chloride: 98 mmol/L (ref 98–111)
Creatinine: 0.72 mg/dL (ref 0.61–1.24)
GFR, Est AFR Am: >60 mL/min (ref >60)
GFR, Estimated: >60 mL/min (ref >60)
Glucose, Bld: 99 mg/dL (ref 70–99)
Potassium: 3.6 mmol/L (ref 3.5–5.1)
Sodium: 133 mmol/L — ABNORMAL LOW (ref 135–145)
Total Bilirubin: 0.4 mg/dL (ref 0.3–1.2)
Total Protein: 8.4 g/dL — ABNORMAL HIGH (ref 6.5–8.1)

## 2019-04-22 ENCOUNTER — Inpatient Hospital Stay: Payer: Medicaid Other

## 2019-04-22 ENCOUNTER — Other Ambulatory Visit: Payer: Self-pay

## 2019-04-22 ENCOUNTER — Encounter: Payer: Self-pay | Admitting: Physician Assistant

## 2019-04-22 ENCOUNTER — Inpatient Hospital Stay (HOSPITAL_BASED_OUTPATIENT_CLINIC_OR_DEPARTMENT_OTHER): Payer: Medicaid Other | Admitting: Physician Assistant

## 2019-04-22 ENCOUNTER — Other Ambulatory Visit: Payer: Medicaid Other

## 2019-04-22 ENCOUNTER — Telehealth: Payer: Self-pay | Admitting: Physician Assistant

## 2019-04-22 VITALS — BP 128/94 | HR 110 | Temp 97.9°F | Resp 18 | Ht 67.0 in | Wt 226.1 lb

## 2019-04-22 VITALS — BP 125/95 | HR 104

## 2019-04-22 DIAGNOSIS — M25511 Pain in right shoulder: Secondary | ICD-10-CM

## 2019-04-22 DIAGNOSIS — Z5111 Encounter for antineoplastic chemotherapy: Secondary | ICD-10-CM

## 2019-04-22 DIAGNOSIS — C7951 Secondary malignant neoplasm of bone: Secondary | ICD-10-CM

## 2019-04-22 DIAGNOSIS — Z5112 Encounter for antineoplastic immunotherapy: Secondary | ICD-10-CM | POA: Diagnosis not present

## 2019-04-22 DIAGNOSIS — C3412 Malignant neoplasm of upper lobe, left bronchus or lung: Secondary | ICD-10-CM

## 2019-04-22 DIAGNOSIS — K869 Disease of pancreas, unspecified: Secondary | ICD-10-CM

## 2019-04-22 DIAGNOSIS — C3490 Malignant neoplasm of unspecified part of unspecified bronchus or lung: Secondary | ICD-10-CM

## 2019-04-22 DIAGNOSIS — C3492 Malignant neoplasm of unspecified part of left bronchus or lung: Secondary | ICD-10-CM

## 2019-04-22 LAB — CBC WITH DIFFERENTIAL (CANCER CENTER ONLY)
Abs Immature Granulocytes: 0.02 10*3/uL (ref 0.00–0.07)
Basophils Absolute: 0 10*3/uL (ref 0.0–0.1)
Basophils Relative: 0 %
Eosinophils Absolute: 0.1 10*3/uL (ref 0.0–0.5)
Eosinophils Relative: 2 %
HCT: 35.3 % — ABNORMAL LOW (ref 39.0–52.0)
Hemoglobin: 11.2 g/dL — ABNORMAL LOW (ref 13.0–17.0)
Immature Granulocytes: 0 %
Lymphocytes Relative: 41 %
Lymphs Abs: 2.7 10*3/uL (ref 0.7–4.0)
MCH: 29.2 pg (ref 26.0–34.0)
MCHC: 31.7 g/dL (ref 30.0–36.0)
MCV: 92.2 fL (ref 80.0–100.0)
Monocytes Absolute: 0.9 10*3/uL (ref 0.1–1.0)
Monocytes Relative: 14 %
Neutro Abs: 2.9 10*3/uL (ref 1.7–7.7)
Neutrophils Relative %: 43 %
Platelet Count: 252 10*3/uL (ref 150–400)
RBC: 3.83 MIL/uL — ABNORMAL LOW (ref 4.22–5.81)
RDW: 16 % — ABNORMAL HIGH (ref 11.5–15.5)
WBC Count: 6.6 10*3/uL (ref 4.0–10.5)
nRBC: 0 % (ref 0.0–0.2)

## 2019-04-22 LAB — CMP (CANCER CENTER ONLY)
ALT: 35 U/L (ref 0–44)
AST: 35 U/L (ref 15–41)
Albumin: 3.9 g/dL (ref 3.5–5.0)
Alkaline Phosphatase: 136 U/L — ABNORMAL HIGH (ref 38–126)
Anion gap: 13 (ref 5–15)
BUN: 8 mg/dL (ref 6–20)
CO2: 23 mmol/L (ref 22–32)
Calcium: 9.1 mg/dL (ref 8.9–10.3)
Chloride: 100 mmol/L (ref 98–111)
Creatinine: 0.72 mg/dL (ref 0.61–1.24)
GFR, Est AFR Am: >60 mL/min (ref >60)
GFR, Estimated: >60 mL/min (ref >60)
Glucose, Bld: 100 mg/dL — ABNORMAL HIGH (ref 70–99)
Potassium: 3.7 mmol/L (ref 3.5–5.1)
Sodium: 136 mmol/L (ref 135–145)
Total Bilirubin: 0.5 mg/dL (ref 0.3–1.2)
Total Protein: 7.9 g/dL (ref 6.5–8.1)

## 2019-04-22 LAB — TSH: TSH: 1.586 u[IU]/mL (ref 0.320–4.118)

## 2019-04-22 MED ORDER — PALONOSETRON HCL INJECTION 0.25 MG/5ML
0.2500 mg | Freq: Once | INTRAVENOUS | Status: AC
  Administered 2019-04-22: 0.25 mg via INTRAVENOUS

## 2019-04-22 MED ORDER — SODIUM CHLORIDE 0.9 % IV SOLN
200.0000 mg | Freq: Once | INTRAVENOUS | Status: AC
  Administered 2019-04-22: 10:00:00 200 mg via INTRAVENOUS
  Filled 2019-04-22: qty 8

## 2019-04-22 MED ORDER — SODIUM CHLORIDE 0.9 % IV SOLN
Freq: Once | INTRAVENOUS | Status: AC
  Administered 2019-04-22: 10:00:00 via INTRAVENOUS
  Filled 2019-04-22: qty 5

## 2019-04-22 MED ORDER — CYANOCOBALAMIN 1000 MCG/ML IJ SOLN
INTRAMUSCULAR | Status: AC
  Filled 2019-04-22: qty 1

## 2019-04-22 MED ORDER — SODIUM CHLORIDE 0.9 % IV SOLN
500.0000 mg/m2 | Freq: Once | INTRAVENOUS | Status: AC
  Administered 2019-04-22: 11:00:00 1100 mg via INTRAVENOUS
  Filled 2019-04-22: qty 40

## 2019-04-22 MED ORDER — PALONOSETRON HCL INJECTION 0.25 MG/5ML
INTRAVENOUS | Status: AC
  Filled 2019-04-22: qty 5

## 2019-04-22 MED ORDER — OXYCODONE-ACETAMINOPHEN 5-325 MG PO TABS: 1.0000 | ORAL_TABLET | Freq: Three times a day (TID) | ORAL | 0 refills | Status: DC | PRN

## 2019-04-22 MED ORDER — SODIUM CHLORIDE 0.9 % IV SOLN
750.0000 mg | Freq: Once | INTRAVENOUS | Status: AC
  Administered 2019-04-22: 11:00:00 750 mg via INTRAVENOUS
  Filled 2019-04-22: qty 75

## 2019-04-22 MED ORDER — SODIUM CHLORIDE 0.9% FLUSH
10.0000 mL | INTRAVENOUS | Status: DC | PRN
  Filled 2019-04-22: qty 10

## 2019-04-22 MED ORDER — CYANOCOBALAMIN 1000 MCG/ML IJ SOLN
1000.0000 ug | Freq: Once | INTRAMUSCULAR | Status: AC
  Administered 2019-04-22: 1000 ug via INTRAMUSCULAR

## 2019-04-22 MED ORDER — SODIUM CHLORIDE 0.9 % IV SOLN
Freq: Once | INTRAVENOUS | Status: AC
  Administered 2019-04-22: 09:00:00 via INTRAVENOUS
  Filled 2019-04-22: qty 250

## 2019-04-22 NOTE — Telephone Encounter (Signed)
Scheduled appt per 4/30 los - pt to get an updated schedule in treatment area.

## 2019-04-22 NOTE — Patient Instructions (Signed)
[facility] Discharge Instructions for Patients Receiving Chemotherapy  Today you received the following chemotherapy agents Keytruda, Alimta, Carboplatin, Vitamin B12  To help prevent nausea and vomiting after your treatment, we encourage you to take your nausea medication: As directed by your MD   If you develop nausea and vomiting that is not controlled by your nausea medication, call the clinic.   BELOW ARE SYMPTOMS THAT SHOULD BE REPORTED IMMEDIATELY:  *FEVER GREATER THAN 100.5 F  *CHILLS WITH OR WITHOUT FEVER  NAUSEA AND VOMITING THAT IS NOT CONTROLLED WITH YOUR NAUSEA MEDICATION  *UNUSUAL SHORTNESS OF BREATH  *UNUSUAL BRUISING OR BLEEDING  TENDERNESS IN MOUTH AND THROAT WITH OR WITHOUT PRESENCE OF ULCERS  *URINARY PROBLEMS  *BOWEL PROBLEMS  UNUSUAL RASH Items with * indicate a potential emergency and should be followed up as soon as possible.  Feel free to call the clinic should you have any questions or concerns. The clinic phone number is [phone].  Please show the [facility] at check-in to the Emergency Department and triage nurse.

## 2019-04-22 NOTE — Progress Notes (Signed)
Okay to treat - Okay to treat pt today with Carboplatin, Alimta and pembrolizumab and elevated BP 125/95 and heart 105.

## 2019-04-22 NOTE — Progress Notes (Signed)
[facility] OFFICE PROGRESS NOTE  Willie Beard, MD [address] [facility]  [address]  DIAGNOSIS: Stage IV (2B, N3, M1 C) non-small cell lung cancer, adenocarcinoma presented with left upper lobe lung mass in addition to mediastinal and left supraclavicular lymphadenopathy as well as bilateral pulmonary nodules, metastatic disease to T10 vertebral body, and a suspicious    mass near the pancreatic tail diagnosed in March 2020.  Molecular Studies by [employer]:   PDL1 Expression:   PRIOR THERAPY: None  CURRENT THERAPY: Systemic chemotherapy with carboplatin for AUC of 5, Alimta 500 mg/M2 and Keytruda 200 mg IV every 3 weeks. First dose March 12, 2019. Status post 2 cycle.   INTERVAL HISTORY: Willie Schmidt 58 y.o. male returns to the clinic today for a follow-up visit.  The patient is feeling well today but he continues to experience right neck/shoulder pain.  His pain is been present since he was diagnosed with cancer in March 2020.  States that his pain is intermittent and feels like a "vibration"/"tense"/"zinging" sensation. His pain is usually worse at night and is exacerbated by movement of his right shoulder. He has been using percocet, ibuprofen, Cymbalta, flexeril, and gabapentin with moderate relief. He also uses a heated pad. He states his pain is slightly improved from onset but still significant.   Regarding his treatment, patient tolerated his last cycle of chemotherapy well without any adverse side effects.  He denies any fever, chills, night sweats, or weight loss.  He denies any chest pain, shortness of breath, or hemoptysis.  The patient still endorses a baseline cough; however, he does state that this is improved since starting treatment.  He uses over-the-counter Delsym for his cough.  He denies any nausea, vomiting, diarrhea, or constipation.  He denies any headache or visual changes.  He denies any rashes or skin changes.  The patient is here today for  evaluation prior to starting cycle #3.   MEDICAL HISTORY: Past Medical History:  Diagnosis Date  . Bronchitis, chronic ([place])   . Hypertension   . metastatic lung ca dx'd 02/2019   lyphadenopathy, bil pulmonary noduels; suspected bone lesions    ALLERGIES:  has No Known Allergies.  MEDICATIONS:  Current Outpatient Medications  Medication Sig Dispense Refill  . albuterol (PROVENTIL HFA;VENTOLIN HFA) 108 (90 BASE) MCG/ACT inhaler Inhale 2 puffs into the lungs every 6 (six) hours as needed for wheezing. (Patient not taking: Reported on 03/03/2019) 1 Inhaler 2  . aspirin 81 MG tablet Take 81 mg by mouth daily.    . cyclobenzaprine (FLEXERIL) 10 MG tablet TAKE 1 TABLET BY MOUTH THREE TIMES A DAY AS NEEDED FOR MUSCLE SPASMS 30 tablet 0  . DULoxetine (CYMBALTA) 30 MG capsule 1 once daily for 1 week then increase to 1 twice daily 60 capsule 3  . folic acid (FOLVITE) 1 MG tablet Take 1 tablet (1 mg total) by mouth daily. 30 tablet 4  . gabapentin (NEURONTIN) 300 MG capsule Take 1 capsule (300 mg total) by mouth 3 (three) times daily. 90 capsule 3  . oxyCODONE-acetaminophen (PERCOCET/ROXICET) 5-325 MG tablet Take 1 tablet by mouth every 8 (eight) hours as needed for severe pain. 30 tablet 0  . prochlorperazine (COMPAZINE) 10 MG tablet Take 1 tablet (10 mg total) by mouth every 6 (six) hours as needed for nausea or vomiting. 30 tablet 0   No current facility-administered medications for this visit.     SURGICAL HISTORY: No past surgical history on  file.  REVIEW OF SYSTEMS:   Review of Systems  Constitutional: Negative for appetite change, chills, fatigue, fever and unexpected weight change.  HENT:   Negative for mouth sores, nosebleeds, sore throat and trouble swallowing.   Eyes: Negative for eye problems and icterus.  Respiratory: Positive for cough. Negative for hemoptysis, shortness of breath and wheezing.   Cardiovascular: Negative for chest pain and leg swelling.  Gastrointestinal:  Negative for abdominal pain, constipation, diarrhea, nausea and vomiting.  Genitourinary: Negative for bladder incontinence, difficulty urinating, dysuria, frequency and hematuria.   Musculoskeletal: Positive for right neck/shoulder stiffness, pain, and decreased ROM. Negative for back pain, gait problem. Skin: Negative for itching and rash.  Neurological: Negative for dizziness, extremity weakness, gait problem, headaches, light-headedness and seizures.  Hematological: Negative for adenopathy. Does not bruise/bleed easily.  Psychiatric/Behavioral: Negative for confusion, depression and sleep disturbance. The patient is not nervous/anxious.     PHYSICAL EXAMINATION:  Blood pressure (!) 128/94, pulse (!) 110, temperature 97.9 F (36.6 C), temperature source Oral, resp. rate 18, height '5\' 7"'  (1.702 m), weight 226 lb 1.6 oz (102.6 kg), SpO2 100 %.  ECOG PERFORMANCE STATUS: 1 - Symptomatic but completely ambulatory  Physical Exam  Constitutional: Oriented to person, place, and time and well-developed, well-nourished, and in no distress.  HENT:  Head: Normocephalic and atraumatic.  Mouth/Throat: Oropharynx is clear and moist. No oropharyngeal exudate.  Eyes: Conjunctivae are normal. Right eye exhibits no discharge. Left eye exhibits no discharge. No scleral icterus.  Neck: Decreased range of motion secondary to pain. Neck supple.  Cardiovascular: Normal rate, regular rhythm, normal heart sounds and intact distal pulses.   Pulmonary/Chest: Effort normal and breath sounds normal. No respiratory distress. No wheezes. No rales.  Abdominal: Soft. Bowel sounds are normal. Exhibits no distension and no mass. There is no tenderness.  Musculoskeletal: limited range of motion of the right shoulder with flexion, extension, abduction, adduction, internal, and external rotation. Tenderness to palpation over anterior neck/shoulder. Exhibits no edema.  Lymphadenopathy:    No cervical adenopathy.   Neurological: Alert and oriented to person, place, and time. Exhibits normal muscle tone. Gait normal. Coordination normal.  Skin: Skin is warm and dry. No rash noted. Not diaphoretic. No erythema. No pallor.  Psychiatric: Mood, memory and judgment normal.  Vitals reviewed.  LABORATORY DATA: Lab Results  Component Value Date   WBC 6.6 04/22/2019   HGB 11.2 (L) 04/22/2019   HCT 35.3 (L) 04/22/2019   MCV 92.2 04/22/2019   PLT 252 04/22/2019      Chemistry      Component Value Date/Time   NA 136 04/22/2019 0739   K 3.7 04/22/2019 0739   CL 100 04/22/2019 0739   CO2 23 04/22/2019 0739   BUN 8 04/22/2019 0739   CREATININE 0.72 04/22/2019 0739      Component Value Date/Time   CALCIUM 9.1 04/22/2019 0739   ALKPHOS 136 (H) 04/22/2019 0739   AST 35 04/22/2019 0739   ALT 35 04/22/2019 0739   BILITOT 0.5 04/22/2019 0739       RADIOGRAPHIC STUDIES:  Ct Abdomen W Contrast  Result Date: 03/31/2019 CLINICAL DATA:  Stage IV non-small lung cancer diagnosed last month. Chemotherapy and immunotherapy in progress. EXAM: CT ABDOMEN WITH CONTRAST TECHNIQUE: Multidetector CT imaging of the abdomen was performed using the standard protocol following bolus administration of intravenous contrast. CONTRAST:  172m OMNIPAQUE IOHEXOL 300 MG/ML  SOLN COMPARISON:  Chest CT 02/14/2019.  Thoracic MRI 03/12/2019. FINDINGS: Lower chest: Subcarinal adenopathy is  partially imaged on image 1/2, not progressive from the prior study. The overall aeration of the lung bases has improved. There is residual patchy airspace disease within the lingula. No significant pleural effusion. Hepatobiliary: The liver is normal in density without focal abnormality. No evidence of gallstones, gallbladder wall thickening or biliary dilatation. Pancreas: There is an ill-defined low-density mass in the pancreatic tail measuring 1.6 x 1.3 cm on image 30/2. This area was previously poorly evaluated due to motion on the prior  examination. There is no surrounding inflammation or pancreatic ductal dilatation. Spleen: Normal in size without focal abnormality. Adrenals/Urinary Tract: Both adrenal glands appear normal. The kidneys and visualized ureters appear normal. The previously described small left posterior perinephric soft tissue nodule at has nearly completely resolved (image 29/2). Stomach/Bowel: No evidence of bowel wall thickening, distention or surrounding inflammatory change. Vascular/Lymphatic: There are no enlarged abdominal lymph nodes. There are small lymph nodes in the porta hepatis and retroperitoneum. There is minimal aortic and branch vessel atherosclerosis. Other: No ascites or peritoneal nodularity. The visualized anterior abdominal wall is intact. Musculoskeletal: Sclerotic lesion in the T10 vertebral body again noted without significant change. No other osseous abnormalities identified. IMPRESSION: 1. Small mass in the pancreatic tail, questionably present on prior chest CT. This is suspicious for metastatic disease. Differential includes primary pancreatic malignancy. Focal inflammation considered less likely given lack of surrounding inflammatory change. 2. Stable sclerotic lesion in the T10 vertebral body consistent with a metastasis on MRI. 3. No other evidence of metastatic disease in the upper abdomen. Previously noted small posterior perinephric soft tissue nodule in the left has nearly completely resolved. 4. Interval slight improved aeration of the lung bases. Electronically Signed   By: Richardean Sale M.D.   On: 03/31/2019 11:01     ASSESSMENT/PLAN:  This is a very pleasant 58 year old African-American male with stage IV non-small cell lung cancer, adenocarcinoma.  He presented with a large left upper lobe lung mass with mediastinal and left supraclavicular lymphadenopathy as well as bilateral pulmonary nodules.  He was also found to have metastatic disease to the T10 vertebral body.  He also has a  suspicious mass in the tail of the pancreas.  His brain MRI was negative for any metastatic disease.  He was diagnosed in March 2020.   The patient is currently undergoing treatment with carboplatin for an AUC of 5, Alimta 500 mg/m, and Keytruda 200 mg IV every 3 weeks.  He is status post 1 cycle.  He tolerated his last treatment well without any adverse side effects.   The patient was seen with Dr. Julien Nordmann today. Labs were reviewed with the patient. We recommend he proceed with cycle #3 today as scheduled.   For the patient's neck pain, Dr. Julien Nordmann and I reviewed his past imaging studies and did not find an etiology for his right shoulder pain. I have made arrangements for a CT scan of the chest prior to be performed prior to his next visit to restage his disease. Perhaps his restaging CT scan will allow [place] to see an etiology of his neck/shoulder pain. He will continue on his current pain regimen for his right shoulder/neck pain. I have sent a refill for percocet to his pharmacy.   We will see him back for a follow-up visit in 3 weeks for evaluation and to review his CT scan before starting cycle #4  The patient will continue taking Delsym for his cough.   The patient was advised to call immediately if  he has any concerning symptoms in the interval. The patient voices understanding of current disease status and treatment options and is in agreement with the current care plan. All questions were answered. The patient knows to call the clinic with any problems, questions or concerns. We can certainly see the patient much sooner if necessary  Orders Placed This Encounter  Procedures  . CT Chest W Contrast    Standing Status:   Future    Standing Expiration Date:   04/21/2020    Order Specific Question:   ** REASON FOR EXAM (FREE TEXT)    Answer:   Restaging Lung Cancer    Order Specific Question:   If indicated for the ordered procedure, I authorize the administration of contrast media per  Radiology protocol    Answer:   Yes    Order Specific Question:   Preferred imaging location?    Answer:   [facility]    Order Specific Question:   Radiology Contrast Protocol - do NOT remove file path    Answer:   [email or web address]     Lekeshia Kram L Gino Garrabrant, PA-C 04/22/19  ADDENDUM: Hematology/Oncology Attending: I had a face-to-face encounter with the patient today.  I recommended his care plan.  This is a very pleasant 58 years old African-American male with metastatic non-small cell lung cancer, adenocarcinoma.  The patient is currently undergoing systemic chemotherapy with carboplatin, Alimta and Keytruda status post 2 cycles.  He has been tolerating this treatment well.  He continues to have pain on the right shoulder area.  He is currently on Percocet for pain control. I recommended for the patient to proceed with cycle #3 today as scheduled. I will see him back for follow-up visit in 3 weeks for evaluation with repeat CT scan of the chest for restaging of his disease. The patient was advised to call immediately if he has any concerning symptoms in the interval.  Disclaimer: This note was dictated with voice recognition software. Similar sounding words can inadvertently be transcribed and may be missed upon review. Eilleen Kempf, MD 04/22/19

## 2019-05-11 ENCOUNTER — Ambulatory Visit (HOSPITAL_COMMUNITY)
Admission: RE | Admit: 2019-05-11 | Discharge: 2019-05-11 | Disposition: A | Discharge status: Home / Self Care | Payer: Medicaid Other | Source: Ambulatory Visit | Attending: Physician Assistant | Admitting: Physician Assistant

## 2019-05-11 ENCOUNTER — Other Ambulatory Visit: Payer: Self-pay

## 2019-05-11 ENCOUNTER — Encounter (HOSPITAL_COMMUNITY): Payer: Self-pay

## 2019-05-11 DIAGNOSIS — C3492 Malignant neoplasm of unspecified part of left bronchus or lung: Secondary | ICD-10-CM | POA: Diagnosis present

## 2019-05-11 MED ORDER — SODIUM CHLORIDE (PF) 0.9 % IJ SOLN
INTRAMUSCULAR | Status: AC
  Filled 2019-05-11: qty 50

## 2019-05-11 MED ORDER — IOHEXOL 300 MG/ML  SOLN
75.0000 mL | Freq: Once | INTRAMUSCULAR | Status: AC | PRN
  Administered 2019-05-11: 09:00:00 75 mL via INTRAVENOUS

## 2019-05-13 ENCOUNTER — Other Ambulatory Visit: Payer: Self-pay

## 2019-05-13 ENCOUNTER — Inpatient Hospital Stay: Payer: Medicaid Other

## 2019-05-13 ENCOUNTER — Other Ambulatory Visit: Payer: Self-pay | Admitting: Internal Medicine

## 2019-05-13 ENCOUNTER — Encounter: Payer: Self-pay | Admitting: Internal Medicine

## 2019-05-13 ENCOUNTER — Inpatient Hospital Stay: Payer: Medicaid Other | Attending: Internal Medicine

## 2019-05-13 ENCOUNTER — Inpatient Hospital Stay (HOSPITAL_BASED_OUTPATIENT_CLINIC_OR_DEPARTMENT_OTHER): Payer: Medicaid Other | Admitting: Internal Medicine

## 2019-05-13 VITALS — BP 124/84 | HR 100 | Temp 98.7°F | Resp 18 | Ht 67.0 in | Wt 230.2 lb

## 2019-05-13 DIAGNOSIS — C3492 Malignant neoplasm of unspecified part of left bronchus or lung: Secondary | ICD-10-CM

## 2019-05-13 DIAGNOSIS — C3412 Malignant neoplasm of upper lobe, left bronchus or lung: Secondary | ICD-10-CM

## 2019-05-13 DIAGNOSIS — Z5111 Encounter for antineoplastic chemotherapy: Secondary | ICD-10-CM | POA: Diagnosis present

## 2019-05-13 DIAGNOSIS — Z5112 Encounter for antineoplastic immunotherapy: Secondary | ICD-10-CM

## 2019-05-13 DIAGNOSIS — Z79899 Other long term (current) drug therapy: Secondary | ICD-10-CM | POA: Insufficient documentation

## 2019-05-13 LAB — CMP (CANCER CENTER ONLY)
ALT: 25 U/L (ref 0–44)
AST: 26 U/L (ref 15–41)
Albumin: 4 g/dL (ref 3.5–5.0)
Alkaline Phosphatase: 125 U/L (ref 38–126)
Anion gap: 11 (ref 5–15)
BUN: 10 mg/dL (ref 6–20)
CO2: 24 mmol/L (ref 22–32)
Calcium: 9.4 mg/dL (ref 8.9–10.3)
Chloride: 98 mmol/L (ref 98–111)
Creatinine: 0.74 mg/dL (ref 0.61–1.24)
GFR, Est AFR Am: >60 mL/min (ref >60)
GFR, Estimated: >60 mL/min (ref >60)
Glucose, Bld: 99 mg/dL (ref 70–99)
Potassium: 3.8 mmol/L (ref 3.5–5.1)
Sodium: 133 mmol/L — ABNORMAL LOW (ref 135–145)
Total Bilirubin: 0.5 mg/dL (ref 0.3–1.2)
Total Protein: 8.3 g/dL — ABNORMAL HIGH (ref 6.5–8.1)

## 2019-05-13 LAB — CBC WITH DIFFERENTIAL (CANCER CENTER ONLY)
Abs Immature Granulocytes: 0.01 10*3/uL (ref 0.00–0.07)
Basophils Absolute: 0 10*3/uL (ref 0.0–0.1)
Basophils Relative: 0 %
Eosinophils Absolute: 0.1 10*3/uL (ref 0.0–0.5)
Eosinophils Relative: 1 %
HCT: 35.6 % — ABNORMAL LOW (ref 39.0–52.0)
Hemoglobin: 11.4 g/dL — ABNORMAL LOW (ref 13.0–17.0)
Immature Granulocytes: 0 %
Lymphocytes Relative: 36 %
Lymphs Abs: 2.6 10*3/uL (ref 0.7–4.0)
MCH: 30.5 pg (ref 26.0–34.0)
MCHC: 32 g/dL (ref 30.0–36.0)
MCV: 95.2 fL (ref 80.0–100.0)
Monocytes Absolute: 1.1 10*3/uL — ABNORMAL HIGH (ref 0.1–1.0)
Monocytes Relative: 16 %
Neutro Abs: 3.3 10*3/uL (ref 1.7–7.7)
Neutrophils Relative %: 47 %
Platelet Count: 256 10*3/uL (ref 150–400)
RBC: 3.74 MIL/uL — ABNORMAL LOW (ref 4.22–5.81)
RDW: 18 % — ABNORMAL HIGH (ref 11.5–15.5)
WBC Count: 7 10*3/uL (ref 4.0–10.5)
nRBC: 0 % (ref 0.0–0.2)

## 2019-05-13 LAB — TSH: TSH: 1.503 u[IU]/mL (ref 0.320–4.118)

## 2019-05-13 MED ORDER — SODIUM CHLORIDE 0.9 % IV SOLN
Freq: Once | INTRAVENOUS | Status: AC
  Administered 2019-05-13: 10:00:00 via INTRAVENOUS
  Filled 2019-05-13: qty 250

## 2019-05-13 MED ORDER — SODIUM CHLORIDE 0.9 % IV SOLN
200.0000 mg | Freq: Once | INTRAVENOUS | Status: AC
  Administered 2019-05-13: 200 mg via INTRAVENOUS
  Filled 2019-05-13: qty 8

## 2019-05-13 MED ORDER — PALONOSETRON HCL INJECTION 0.25 MG/5ML
0.2500 mg | Freq: Once | INTRAVENOUS | Status: AC
  Administered 2019-05-13: 0.25 mg via INTRAVENOUS

## 2019-05-13 MED ORDER — SODIUM CHLORIDE 0.9 % IV SOLN
750.0000 mg | Freq: Once | INTRAVENOUS | Status: AC
  Administered 2019-05-13: 750 mg via INTRAVENOUS
  Filled 2019-05-13: qty 75

## 2019-05-13 MED ORDER — SODIUM CHLORIDE 0.9 % IV SOLN
Freq: Once | INTRAVENOUS | Status: AC
  Administered 2019-05-13: 10:00:00 via INTRAVENOUS
  Filled 2019-05-13: qty 5

## 2019-05-13 MED ORDER — PALONOSETRON HCL INJECTION 0.25 MG/5ML
INTRAVENOUS | Status: AC
  Filled 2019-05-13: qty 5

## 2019-05-13 MED ORDER — SODIUM CHLORIDE 0.9 % IV SOLN
500.0000 mg/m2 | Freq: Once | INTRAVENOUS | Status: AC
  Administered 2019-05-13: 1100 mg via INTRAVENOUS
  Filled 2019-05-13: qty 40

## 2019-05-13 NOTE — Patient Instructions (Signed)
[facility] Discharge Instructions for Patients Receiving Chemotherapy  Today you received the following chemotherapy agents Keytruda, Alimta, Carboplatin.   To help prevent nausea and vomiting after your treatment, we encourage you to take your nausea medication: As directed by your MD   If you develop nausea and vomiting that is not controlled by your nausea medication, call the clinic.   BELOW ARE SYMPTOMS THAT SHOULD BE REPORTED IMMEDIATELY:  *FEVER GREATER THAN 100.5 F  *CHILLS WITH OR WITHOUT FEVER  NAUSEA AND VOMITING THAT IS NOT CONTROLLED WITH YOUR NAUSEA MEDICATION  *UNUSUAL SHORTNESS OF BREATH  *UNUSUAL BRUISING OR BLEEDING  TENDERNESS IN MOUTH AND THROAT WITH OR WITHOUT PRESENCE OF ULCERS  *URINARY PROBLEMS  *BOWEL PROBLEMS  UNUSUAL RASH Items with * indicate a potential emergency and should be followed up as soon as possible.  Feel free to call the clinic should you have any questions or concerns. The clinic phone number is [phone].  Please show the [facility] at check-in to the Emergency Department and triage nurse.

## 2019-05-13 NOTE — Progress Notes (Signed)
[facility] Telephone:([phone]   Fax:([phone]  OFFICE PROGRESS NOTE  [name], [address] [place] [address]  DIAGNOSIS: Stage IV (T2b, N3, M1 C) non-small cell lung cancer, adenocarcinoma presented with left upper lobe lung mass in addition to mediastinal and left supraclavicular lymphadenopathy as well as bilateral pulmonary nodules and suspicious bone lesion diagnosed in March 2020.  PRIOR THERAPY: None  CURRENT THERAPY: Systemic chemotherapy with carboplatin for AUC of 5, Alimta 500 mg/M2 and Keytruda 200 mg IV every 3 weeks.  First dose March 11, 2019.  Status post 3 cycles.  INTERVAL HISTORY: Willie Schmidt 58 y.o. male returns to the clinic today for follow-up visit.  The patient is feeling fine today with no concerning complaints except for intermittent right shoulder pain.  He denied having any chest pain, shortness of breath, cough or hemoptysis.  He denied having any fever or chills.  He has no nausea, vomiting, diarrhea.  He continues to tolerate his treatment fairly well with no concerning adverse effects.  The patient had repeat CT scan of the chest, abdomen pelvis performed recently and is here for evaluation and discussion of the scan results and treatment options.  MEDICAL HISTORY: Past Medical History:  Diagnosis Date  . Bronchitis, chronic ([place])   . Hypertension   . metastatic lung ca dx'd 02/2019   lyphadenopathy, bil pulmonary noduels; suspected bone lesions    ALLERGIES:  has No Known Allergies.  MEDICATIONS:  Current Outpatient Medications  Medication Sig Dispense Refill  . albuterol (PROVENTIL HFA;VENTOLIN HFA) 108 (90 BASE) MCG/ACT inhaler Inhale 2 puffs into the lungs every 6 (six) hours as needed for wheezing. (Patient not taking: Reported on 03/03/2019) 1 Inhaler 2  . aspirin 81 MG tablet Take 81 mg by mouth daily.    . cyclobenzaprine (FLEXERIL) 10 MG tablet TAKE 1 TABLET BY MOUTH THREE TIMES A DAY AS NEEDED  FOR MUSCLE SPASMS 30 tablet 0  . DULoxetine (CYMBALTA) 30 MG capsule 1 once daily for 1 week then increase to 1 twice daily 60 capsule 3  . folic acid (FOLVITE) 1 MG tablet Take 1 tablet (1 mg total) by mouth daily. 30 tablet 4  . gabapentin (NEURONTIN) 300 MG capsule Take 1 capsule (300 mg total) by mouth 3 (three) times daily. 90 capsule 3  . oxyCODONE-acetaminophen (PERCOCET/ROXICET) 5-325 MG tablet Take 1 tablet by mouth every 8 (eight) hours as needed for severe pain. 30 tablet 0  . prochlorperazine (COMPAZINE) 10 MG tablet Take 1 tablet (10 mg total) by mouth every 6 (six) hours as needed for nausea or vomiting. 30 tablet 0   No current facility-administered medications for this visit.     SURGICAL HISTORY: No past surgical history on file.  REVIEW OF SYSTEMS:  Constitutional: negative Eyes: negative Ears, nose, mouth, throat, and face: negative Respiratory: negative Cardiovascular: negative Gastrointestinal: negative Genitourinary:negative Integument/breast: negative Hematologic/lymphatic: negative Musculoskeletal:positive for neck pain Neurological: negative Behavioral/Psych: negative Endocrine: negative Allergic/Immunologic: negative   PHYSICAL EXAMINATION: General appearance: alert, cooperative, fatigued and no distress Head: Normocephalic, without obvious abnormality, atraumatic Neck: no adenopathy, no JVD, supple, symmetrical, trachea midline and thyroid not enlarged, symmetric, no tenderness/mass/nodules Lymph nodes: Cervical, supraclavicular, and axillary nodes normal. Resp: clear to auscultation bilaterally Back: symmetric, no curvature. ROM normal. No CVA tenderness. Cardio: regular rate and rhythm, S1, S2 normal, no murmur, click, rub or gallop GI: soft, non-tender; bowel sounds normal; no masses,  no organomegaly Extremities:  extremities normal, atraumatic, no cyanosis or edema Neurologic: Alert and oriented X 3, normal strength and tone. Normal symmetric  reflexes. Normal coordination and gait  ECOG PERFORMANCE STATUS: 1 - Symptomatic but completely ambulatory  Blood pressure 124/84, pulse 100, temperature 98.7 F (37.1 C), temperature source Oral, resp. rate 18, height 5\' 7"  (1.702 m), weight 230 lb 3.2 oz (104.4 kg), SpO2 100 %.  LABORATORY DATA: Lab Results  Component Value Date   WBC 6.6 04/22/2019   HGB 11.2 (L) 04/22/2019   HCT 35.3 (L) 04/22/2019   MCV 92.2 04/22/2019   PLT 252 04/22/2019      Chemistry      Component Value Date/Time   NA 136 04/22/2019 0739   K 3.7 04/22/2019 0739   CL 100 04/22/2019 0739   CO2 23 04/22/2019 0739   BUN 8 04/22/2019 0739   CREATININE 0.72 04/22/2019 0739      Component Value Date/Time   CALCIUM 9.1 04/22/2019 0739   ALKPHOS 136 (H) 04/22/2019 0739   AST 35 04/22/2019 0739   ALT 35 04/22/2019 0739   BILITOT 0.5 04/22/2019 0739       RADIOGRAPHIC STUDIES: Ct Chest W Contrast  Result Date: 05/11/2019 CLINICAL DATA:  Follow-up for lung carcinoma diagnosed in February 2020. Patient underwent chemo and immunotherapy. Patient on 4 week hiatus prior to restarting chemotherapy. EXAM: CT CHEST WITH CONTRAST TECHNIQUE: Multidetector CT imaging of the chest was performed during intravenous contrast administration. CONTRAST:  37mL OMNIPAQUE IOHEXOL 300 MG/ML  SOLN COMPARISON:  Chest CT, 02/14/2019 FINDINGS: Cardiovascular: Heart normal in size. Great vessels are normal in caliber. Mild stable aortic atherosclerosis. Mediastinum/Nodes: Prominent left supraclavicular nodes, largest 12 mm in short axis, unchanged. Right paratracheal node, 6 mm in short axis, also stable. Subcarinal node measures 1 cm short axis, decreased from 13 mm on the prior study. No new adenopathy. Ill-defined infiltrative left AP window mass has decreased in overall size, now approximately 4.4 x 3.1 cm transversely, previously 6.4 x 4.7 cm. Abnormal soft tissue extends from the AP window mass along the course of the left mainstem  bronchus and to the left hilum, decreased in thickness compared to the prior exam. No right hilar mass. Trachea is widely patent. Esophagus is unremarkable. Lungs/Pleura: There has been significant improvement. The multiple lung masses and nodules noted in the left upper lobe have significantly decreased in size. Several reference measurements made. Left upper lobe nodule, centered on image 26, series 7, currently 11 x 6 mm, previously 24 mm in greatest dimension. Focal area of masslike opacity in the left upper lobe more inferiorly and posteriorly on the prior study measured 4.6 x 3.3 cm. Currently there are 2 small discrete nodules remaining, centered on image 41, series 7, the more anterior measuring 11 x 11 mm and the more posterior measuring 11 x 7 mm. No new left upper lobe masses or nodules. Small ground-glass nodule, 6 mm, right upper lobe centered on image 33, series 7, not evident on the prior study. No other new nodules. No pleural effusion.  No pneumothorax. Upper Abdomen: No visualized liver mass or lesion. No adrenal masses. Previously seen small retroperitoneal nodule on the left, posterior to the left kidney, is no longer visualized. No acute findings. Musculoskeletal: Pain sclerotic lesion in the T10 vertebra is unchanged. There are no other sclerotic lesions and no osteolytic lesions. IMPRESSION: 1. There has been significant interval improvement in the patient's lung carcinoma. The infiltrative mass centered on the AP window, extending to  the left hilar structures, has significantly decreased in size. Multiple left upper lobe masses and nodules have also notably decreased in size. A subcarinal lymph node has decreased in size. Small retroperitoneal nodule noted on the left on the prior study is no longer visualized. 2. There is been no change in the size the left supraclavicular lymph nodes or the subcentimeter right paratracheal lymph node. No change in the faint sclerotic lesion in the T10  vertebra. 3. 6 mm ground-glass nodule noted in the right upper lobe, not evidence on the prior study. This could be metastatic disease or inflammatory in etiology. There is no other evidence to suggest new metastatic disease. Aortic Atherosclerosis (ICD10-I70.0). Electronically Signed   By: Lajean Manes M.D.   On: 05/11/2019 14:01    ASSESSMENT AND PLAN: This is a very pleasant 58 years old African-American male with likely stage IV non-small cell lung cancer, adenocarcinoma presented with large left upper lobe lung mass in addition to mediastinal and left supraclavicular lymphadenopathy as well as suspicious bone metastasis and bilateral pulmonary nodules diagnosed in March 2020. The patient is currently undergoing systemic chemotherapy with carboplatin, Alimta and Keytruda status post 3 cycles.  He has been tolerating this treatment well with no concerning adverse effects. He had repeat CT scan of the chest, abdomen and pelvis performed recently.  I personally and independently reviewed the scans and discussed the results with the patient today. His scan showed improvement of his disease with no concerning findings for progression. I recommended for the patient to continue his current treatment with carboplatin, Alimta and Keytruda for cycle #4. We will see the patient back for follow-up visit in 3 weeks for evaluation before starting the first cycle of his maintenance treatment with Alimta and Keytruda. He was advised to call immediately if he has any concerning symptoms in the interval. The patient voices understanding of current disease status and treatment options and is in agreement with the current care plan.  All questions were answered. The patient knows to call the clinic with any problems, questions or concerns. We can certainly see the patient much sooner if necessary.   Disclaimer: This note was dictated with voice recognition software. Similar sounding words can inadvertently be  transcribed and may not be corrected upon review.

## 2019-05-18 ENCOUNTER — Telehealth: Payer: Self-pay | Admitting: Physician Assistant

## 2019-05-18 NOTE — Telephone Encounter (Signed)
Scheduled appt for 5/21 los - left message for patient with appt date and time

## 2019-05-20 ENCOUNTER — Inpatient Hospital Stay: Payer: Medicaid Other

## 2019-05-21 ENCOUNTER — Other Ambulatory Visit: Payer: Self-pay | Admitting: Internal Medicine

## 2019-05-21 NOTE — Telephone Encounter (Signed)
Ok to refill 

## 2019-05-27 ENCOUNTER — Inpatient Hospital Stay: Payer: Medicaid Other | Attending: Internal Medicine

## 2019-05-27 ENCOUNTER — Other Ambulatory Visit: Payer: Self-pay | Admitting: Physician Assistant

## 2019-05-27 ENCOUNTER — Telehealth: Payer: Self-pay | Admitting: Medical Oncology

## 2019-05-27 DIAGNOSIS — C7951 Secondary malignant neoplasm of bone: Secondary | ICD-10-CM | POA: Insufficient documentation

## 2019-05-27 DIAGNOSIS — C3412 Malignant neoplasm of upper lobe, left bronchus or lung: Secondary | ICD-10-CM | POA: Insufficient documentation

## 2019-05-27 DIAGNOSIS — C3492 Malignant neoplasm of unspecified part of left bronchus or lung: Secondary | ICD-10-CM

## 2019-05-27 DIAGNOSIS — Z5111 Encounter for antineoplastic chemotherapy: Secondary | ICD-10-CM | POA: Insufficient documentation

## 2019-05-27 DIAGNOSIS — Z5112 Encounter for antineoplastic immunotherapy: Secondary | ICD-10-CM | POA: Insufficient documentation

## 2019-05-27 MED ORDER — OXYCODONE-ACETAMINOPHEN 5-325 MG PO TABS: 1.0000 | ORAL_TABLET | Freq: Three times a day (TID) | ORAL | 0 refills | Status: DC | PRN

## 2019-05-27 NOTE — Telephone Encounter (Signed)
Faxed request for refill Percocet,

## 2019-06-03 ENCOUNTER — Inpatient Hospital Stay: Payer: Medicaid Other

## 2019-06-03 ENCOUNTER — Other Ambulatory Visit: Payer: Self-pay

## 2019-06-03 ENCOUNTER — Encounter: Payer: Self-pay | Admitting: Physician Assistant

## 2019-06-03 ENCOUNTER — Inpatient Hospital Stay (HOSPITAL_BASED_OUTPATIENT_CLINIC_OR_DEPARTMENT_OTHER): Payer: Medicaid Other | Admitting: Physician Assistant

## 2019-06-03 VITALS — BP 128/92 | HR 112 | Temp 98.5°F | Resp 18 | Ht 67.0 in | Wt 230.1 lb

## 2019-06-03 VITALS — HR 109

## 2019-06-03 DIAGNOSIS — C3492 Malignant neoplasm of unspecified part of left bronchus or lung: Secondary | ICD-10-CM

## 2019-06-03 DIAGNOSIS — C7951 Secondary malignant neoplasm of bone: Secondary | ICD-10-CM

## 2019-06-03 DIAGNOSIS — Z5112 Encounter for antineoplastic immunotherapy: Secondary | ICD-10-CM

## 2019-06-03 DIAGNOSIS — Z5111 Encounter for antineoplastic chemotherapy: Secondary | ICD-10-CM | POA: Diagnosis present

## 2019-06-03 DIAGNOSIS — C3412 Malignant neoplasm of upper lobe, left bronchus or lung: Secondary | ICD-10-CM | POA: Diagnosis present

## 2019-06-03 LAB — CBC WITH DIFFERENTIAL (CANCER CENTER ONLY)
Abs Immature Granulocytes: 0.01 10*3/uL (ref 0.00–0.07)
Basophils Absolute: 0 10*3/uL (ref 0.0–0.1)
Basophils Relative: 0 %
Eosinophils Absolute: 0.1 10*3/uL (ref 0.0–0.5)
Eosinophils Relative: 1 %
HCT: 37 % — ABNORMAL LOW (ref 39.0–52.0)
Hemoglobin: 11.5 g/dL — ABNORMAL LOW (ref 13.0–17.0)
Immature Granulocytes: 0 %
Lymphocytes Relative: 42 %
Lymphs Abs: 2.8 10*3/uL (ref 0.7–4.0)
MCH: 31.6 pg (ref 26.0–34.0)
MCHC: 31.1 g/dL (ref 30.0–36.0)
MCV: 101.6 fL — ABNORMAL HIGH (ref 80.0–100.0)
Monocytes Absolute: 0.9 10*3/uL (ref 0.1–1.0)
Monocytes Relative: 13 %
Neutro Abs: 2.9 10*3/uL (ref 1.7–7.7)
Neutrophils Relative %: 44 %
Platelet Count: 256 10*3/uL (ref 150–400)
RBC: 3.64 MIL/uL — ABNORMAL LOW (ref 4.22–5.81)
RDW: 17.6 % — ABNORMAL HIGH (ref 11.5–15.5)
WBC Count: 6.7 10*3/uL (ref 4.0–10.5)
nRBC: 0 % (ref 0.0–0.2)

## 2019-06-03 LAB — CMP (CANCER CENTER ONLY)
ALT: 19 U/L (ref 0–44)
AST: 24 U/L (ref 15–41)
Albumin: 4.1 g/dL (ref 3.5–5.0)
Alkaline Phosphatase: 125 U/L (ref 38–126)
Anion gap: 13 (ref 5–15)
BUN: 8 mg/dL (ref 6–20)
CO2: 22 mmol/L (ref 22–32)
Calcium: 9.5 mg/dL (ref 8.9–10.3)
Chloride: 100 mmol/L (ref 98–111)
Creatinine: 0.76 mg/dL (ref 0.61–1.24)
GFR, Est AFR Am: >60 mL/min (ref >60)
GFR, Estimated: >60 mL/min (ref >60)
Glucose, Bld: 112 mg/dL — ABNORMAL HIGH (ref 70–99)
Potassium: 3.6 mmol/L (ref 3.5–5.1)
Sodium: 135 mmol/L (ref 135–145)
Total Bilirubin: 0.4 mg/dL (ref 0.3–1.2)
Total Protein: 8.5 g/dL — ABNORMAL HIGH (ref 6.5–8.1)

## 2019-06-03 LAB — TSH: TSH: 1.235 u[IU]/mL (ref 0.320–4.118)

## 2019-06-03 MED ORDER — PROCHLORPERAZINE MALEATE 10 MG PO TABS
10.0000 mg | ORAL_TABLET | Freq: Once | ORAL | Status: AC
  Administered 2019-06-03: 11:00:00 10 mg via ORAL

## 2019-06-03 MED ORDER — PROCHLORPERAZINE MALEATE 10 MG PO TABS
ORAL_TABLET | ORAL | Status: AC
  Filled 2019-06-03: qty 1

## 2019-06-03 MED ORDER — SODIUM CHLORIDE 0.9 % IV SOLN
Freq: Once | INTRAVENOUS | Status: AC
  Administered 2019-06-03: 10:00:00 via INTRAVENOUS
  Filled 2019-06-03: qty 250

## 2019-06-03 MED ORDER — SODIUM CHLORIDE 0.9 % IV SOLN
500.0000 mg/m2 | Freq: Once | INTRAVENOUS | Status: AC
  Administered 2019-06-03: 12:00:00 1100 mg via INTRAVENOUS
  Filled 2019-06-03: qty 40

## 2019-06-03 MED ORDER — SODIUM CHLORIDE 0.9 % IV SOLN
200.0000 mg | Freq: Once | INTRAVENOUS | Status: AC
  Administered 2019-06-03: 11:00:00 200 mg via INTRAVENOUS
  Filled 2019-06-03: qty 8

## 2019-06-03 MED ORDER — SODIUM CHLORIDE 0.9 % IV SOLN
Freq: Once | INTRAVENOUS | Status: DC
  Filled 2019-06-03: qty 250

## 2019-06-03 NOTE — Progress Notes (Signed)
[facility] OFFICE PROGRESS NOTE  [name], MD [address] [place] [address]  DIAGNOSIS: Stage IV (2B, N3, M1 C) non-small cell lung cancer, adenocarcinoma presented with left upper lobe lung mass in addition to mediastinal and left supraclavicular lymphadenopathy as well as bilateral pulmonary nodules, metastatic disease to T10 vertebral body, and a suspicious  mass near the pancreatic tail diagnosed in March 2020.  PDL1 Expression:   Molecular Studies by [employer]:   PRIOR THERAPY: None  CURRENT THERAPY: Systemic chemotherapy with carboplatin for AUC of 5, Alimta 500 mg/M2 and Keytruda 200 mg IV every 3 weeks. First dose March20, 2020.Status post 4 cycles.  INTERVAL HISTORY: Willie Schmidt 58 y.o. male returns to the clinic for a follow-up visit.  The patient is feeling fairly well today but continues to experience right shoulder pain/neck pain. He states this is improved from prior but still present. He takes Percocet, Cymbalta, Flexeril, and gabapentin with some relief.  He denies any new pain.  He tolerated his last treatment well without any adverse side effects.  He denies any fever, chills, night sweats, or weight loss.  He denies any chest pain or hemoptysis. He reports his usual baseline dyspnea on exertion on cough. He states his cough has improved since starting treatment. He uses Delsym for his cough. He denies any nausea, vomiting, diarrhea, or constipation.  He denies any headache or visual changes.  Denies any rashes or skin changes.  He is here today for evaluation before starting cycle #5.   MEDICAL HISTORY: Past Medical History:  Diagnosis Date  . Bronchitis, chronic ([place])   . Hypertension   . metastatic lung ca dx'd 02/2019   lyphadenopathy, bil pulmonary noduels; suspected bone lesions    ALLERGIES:  has No Known Allergies.  MEDICATIONS:  Current Outpatient Medications  Medication Sig Dispense Refill  . aspirin 81 MG tablet  Take 81 mg by mouth daily.    . cyclobenzaprine (FLEXERIL) 10 MG tablet TAKE 1 TABLET BY MOUTH THREE TIMES A DAY AS NEEDED FOR MUSCLE SPASMS 30 tablet 0  . DULoxetine (CYMBALTA) 30 MG capsule 1 once daily for 1 week then increase to 1 twice daily 60 capsule 3  . folic acid (FOLVITE) 1 MG tablet Take 1 tablet (1 mg total) by mouth daily. 30 tablet 4  . gabapentin (NEURONTIN) 300 MG capsule Take 1 capsule (300 mg total) by mouth 3 (three) times daily. 90 capsule 3  . oxyCODONE-acetaminophen (PERCOCET/ROXICET) 5-325 MG tablet Take 1 tablet by mouth every 8 (eight) hours as needed for severe pain. 30 tablet 0  . prochlorperazine (COMPAZINE) 10 MG tablet Take 1 tablet (10 mg total) by mouth every 6 (six) hours as needed for nausea or vomiting. 30 tablet 0  . albuterol (PROVENTIL HFA;VENTOLIN HFA) 108 (90 BASE) MCG/ACT inhaler Inhale 2 puffs into the lungs every 6 (six) hours as needed for wheezing. (Patient not taking: Reported on 03/03/2019) 1 Inhaler 2   No current facility-administered medications for this visit.     SURGICAL HISTORY: No past surgical history on file.  REVIEW OF SYSTEMS:   Review of Systems  Constitutional: Negative for appetite change, chills, fatigue, fever and unexpected weight change.  HENT:   Negative for mouth sores, nosebleeds, sore throat and trouble swallowing.   Eyes: Negative for eye problems and icterus.  Respiratory: Positive for baseline cough and dyspnea on exertion. Negative for  hemoptysis and wheezing.   Cardiovascular: Negative for chest pain  and leg swelling.  Gastrointestinal: Negative for abdominal pain, constipation, diarrhea, nausea and vomiting.  Genitourinary: Negative for bladder incontinence, difficulty urinating, dysuria, frequency and hematuria.   Musculoskeletal: Positive for neck pain/right shoulder pain. Negative for back pain and gait problems Skin: Negative for itching and rash.  Neurological: Negative for dizziness, extremity weakness, gait  problem, headaches, light-headedness and seizures.  Hematological: Negative for adenopathy. Does not bruise/bleed easily.  Psychiatric/Behavioral: Negative for confusion, depression and sleep disturbance. The patient is not nervous/anxious.     PHYSICAL EXAMINATION:  Blood pressure (!) 128/92, pulse (!) 112, temperature 98.5 F (36.9 C), temperature source Oral, resp. rate 18, height '5\' 7"'  (1.702 m), weight 230 lb 1.6 oz (104.4 kg).  ECOG PERFORMANCE STATUS: 1 - Symptomatic but completely ambulatory  Physical Exam  Constitutional: Oriented to person, place, and time and well-developed, well-nourished, and in no distress.  HENT:  Head: Normocephalic and atraumatic.  Mouth/Throat: Oropharynx is clear and moist. No oropharyngeal exudate.  Eyes: Conjunctivae are normal. Right eye exhibits no discharge. Left eye exhibits no discharge. No scleral icterus.  Neck: Normal range of motion. Neck supple.  Cardiovascular: Normal rate, regular rhythm, normal heart sounds and intact distal pulses.   Pulmonary/Chest: Effort normal and breath sounds normal. No respiratory distress. No wheezes. No rales.  Abdominal: Soft. Bowel sounds are normal. Exhibits no distension and no mass. There is no tenderness.  Musculoskeletal: Tenderness to palpation over the right neck/shoulder. Decreased ROM of the right shoulder due to " shoulder tightness". Exhibits no edema.  Lymphadenopathy:    No cervical adenopathy.  Neurological: Alert and oriented to person, place, and time. Exhibits normal muscle tone. Gait normal. Coordination normal.  Skin: Skin is warm and dry. No rash noted. Not diaphoretic. No erythema. No pallor.  Psychiatric: Mood, memory and judgment normal.  Vitals reviewed.  LABORATORY DATA: Lab Results  Component Value Date   WBC 6.7 06/03/2019   HGB 11.5 (L) 06/03/2019   HCT 37.0 (L) 06/03/2019   MCV 101.6 (H) 06/03/2019   PLT 256 06/03/2019      Chemistry      Component Value Date/Time    NA 135 06/03/2019 0853   K 3.6 06/03/2019 0853   CL 100 06/03/2019 0853   CO2 22 06/03/2019 0853   BUN 8 06/03/2019 0853   CREATININE 0.76 06/03/2019 0853      Component Value Date/Time   CALCIUM 9.5 06/03/2019 0853   ALKPHOS 125 06/03/2019 0853   AST 24 06/03/2019 0853   ALT 19 06/03/2019 0853   BILITOT 0.4 06/03/2019 0853       RADIOGRAPHIC STUDIES:  Ct Chest W Contrast  Result Date: 05/11/2019 CLINICAL DATA:  Follow-up for lung carcinoma diagnosed in February 2020. Patient underwent chemo and immunotherapy. Patient on 4 week hiatus prior to restarting chemotherapy. EXAM: CT CHEST WITH CONTRAST TECHNIQUE: Multidetector CT imaging of the chest was performed during intravenous contrast administration. CONTRAST:  60m OMNIPAQUE IOHEXOL 300 MG/ML  SOLN COMPARISON:  Chest CT, 02/14/2019 FINDINGS: Cardiovascular: Heart normal in size. Great vessels are normal in caliber. Mild stable aortic atherosclerosis. Mediastinum/Nodes: Prominent left supraclavicular nodes, largest 12 mm in short axis, unchanged. Right paratracheal node, 6 mm in short axis, also stable. Subcarinal node measures 1 cm short axis, decreased from 13 mm on the prior study. No new adenopathy. Ill-defined infiltrative left AP window mass has decreased in overall size, now approximately 4.4 x 3.1 cm transversely, previously 6.4 x 4.7 cm. Abnormal soft tissue extends from the AP  window mass along the course of the left mainstem bronchus and to the left hilum, decreased in thickness compared to the prior exam. No right hilar mass. Trachea is widely patent. Esophagus is unremarkable. Lungs/Pleura: There has been significant improvement. The multiple lung masses and nodules noted in the left upper lobe have significantly decreased in size. Several reference measurements made. Left upper lobe nodule, centered on image 26, series 7, currently 11 x 6 mm, previously 24 mm in greatest dimension. Focal area of masslike opacity in the left upper  lobe more inferiorly and posteriorly on the prior study measured 4.6 x 3.3 cm. Currently there are 2 small discrete nodules remaining, centered on image 41, series 7, the more anterior measuring 11 x 11 mm and the more posterior measuring 11 x 7 mm. No new left upper lobe masses or nodules. Small ground-glass nodule, 6 mm, right upper lobe centered on image 33, series 7, not evident on the prior study. No other new nodules. No pleural effusion.  No pneumothorax. Upper Abdomen: No visualized liver mass or lesion. No adrenal masses. Previously seen small retroperitoneal nodule on the left, posterior to the left kidney, is no longer visualized. No acute findings. Musculoskeletal: Pain sclerotic lesion in the T10 vertebra is unchanged. There are no other sclerotic lesions and no osteolytic lesions. IMPRESSION: 1. There has been significant interval improvement in the patient's lung carcinoma. The infiltrative mass centered on the AP window, extending to the left hilar structures, has significantly decreased in size. Multiple left upper lobe masses and nodules have also notably decreased in size. A subcarinal lymph node has decreased in size. Small retroperitoneal nodule noted on the left on the prior study is no longer visualized. 2. There is been no change in the size the left supraclavicular lymph nodes or the subcentimeter right paratracheal lymph node. No change in the faint sclerotic lesion in the T10 vertebra. 3. 6 mm ground-glass nodule noted in the right upper lobe, not evidence on the prior study. This could be metastatic disease or inflammatory in etiology. There is no other evidence to suggest new metastatic disease. Aortic Atherosclerosis (ICD10-I70.0). Electronically Signed   By: Lajean Manes M.D.   On: 05/11/2019 14:01     ASSESSMENT/PLAN:  This is a very pleasant 58 year old African-American male with stage IV non-small cell lung cancer, adenocarcinoma. He presented with a large left upper lobe  lung mass with mediastinal andleft supraclavicular lymphadenopathy as well as bilateral pulmonary nodules. He was also found to have metastatic disease to the T10 vertebral body.He also has a suspicious mass in the tail of the pancreas.His brain MRI was negative for any metastatic disease. He was diagnosed in March 2020.   The patient is currently undergoing treatment with carboplatin for an AUC of 5, Alimta 500 mg/m, and Keytruda 200 mg IV every 3 weeks. He is status post 4 cycles. He tolerated his last treatment well without any adverse side effects.  Starting from cycle #5, the patient will receive maintenance with Keytruda 200 mg IV and Alimta 500 mg/m IV every 3 weeks.  The patient was seen with Dr. Julien Nordmann today.  Labs were reviewed with the patient.  We recommend that he proceed with cycle #5 today as scheduled.   We will see the patient back for a follow-up visit in 3 weeks for evaluation before proceeding with cycle #6.  He will continue taking Delsym for his cough. For pain management, he will continue with his current regimen with Cymbalta, flexeril, gabapentin,  and percocet. He will take OTC ibuprofen if needed.  The patient was advised to call immediately if he has any concerning symptoms in the interval. The patient voices understanding of current disease status and treatment options and is in agreement with the current care plan. All questions were answered. The patient knows to call the clinic with any problems, questions or concerns. We can certainly see the patient much sooner if necessary   No orders of the defined types were placed in this encounter.    Anurag Scarfo L Lexi Conaty, PA-C 06/03/19  ADDENDUM: Hematology/Oncology Attending: I had a face-to-face encounter with the patient today.  I recommended his care plan.  This is a very pleasant 58 years old African-American man with a stage IV non-small cell lung cancer.  He is status post 4 cycles of induction  systemic chemotherapy with carboplatin, Alimta and Keytruda.  He tolerated the last cycle of his treatment well with no concerning adverse effects.  I recommended for the patient to proceed with cycle #1 of his maintenance therapy with Alimta and Keytruda today as planned. He continues to have right shoulder and chest pain.  He would continue his current treatment with Cymbalta and gabapentin Flexeril and Percocet. He will come back for follow-up visit in 3 weeks for evaluation before the next cycle of his treatment. The patient was advised to call immediately if he has any concerning symptoms in the interval.  Disclaimer: This note was dictated with voice recognition software. Similar sounding words can inadvertently be transcribed and may be missed upon review. Eilleen Kempf, MD 06/03/19

## 2019-06-03 NOTE — Progress Notes (Signed)
Ok to treat per [employer], PA with heart rate reading for today.

## 2019-06-03 NOTE — Patient Instructions (Addendum)
[facility] Discharge Instructions for Patients Receiving Chemotherapy  Today you received the following chemotherapy agents Keytruda, Alimta.  To help prevent nausea and vomiting after your treatment, we encourage you to take your nausea medication: As directed by your MD   If you develop nausea and vomiting that is not controlled by your nausea medication, call the clinic.   BELOW ARE SYMPTOMS THAT SHOULD BE REPORTED IMMEDIATELY:  *FEVER GREATER THAN 100.5 F  *CHILLS WITH OR WITHOUT FEVER  NAUSEA AND VOMITING THAT IS NOT CONTROLLED WITH YOUR NAUSEA MEDICATION  *UNUSUAL SHORTNESS OF BREATH  *UNUSUAL BRUISING OR BLEEDING  TENDERNESS IN MOUTH AND THROAT WITH OR WITHOUT PRESENCE OF ULCERS  *URINARY PROBLEMS  *BOWEL PROBLEMS  UNUSUAL RASH Items with * indicate a potential emergency and should be followed up as soon as possible.  Feel free to call the clinic should you have any questions or concerns. The clinic phone number is [phone].  Please show the [facility] at check-in to the Emergency Department and triage nurse.

## 2019-06-10 ENCOUNTER — Other Ambulatory Visit: Payer: Medicaid Other

## 2019-06-17 ENCOUNTER — Other Ambulatory Visit: Payer: Medicaid Other

## 2019-06-24 ENCOUNTER — Other Ambulatory Visit: Payer: Self-pay

## 2019-06-24 ENCOUNTER — Inpatient Hospital Stay: Payer: Medicaid Other | Attending: Internal Medicine

## 2019-06-24 ENCOUNTER — Inpatient Hospital Stay (HOSPITAL_BASED_OUTPATIENT_CLINIC_OR_DEPARTMENT_OTHER): Payer: Medicaid Other | Admitting: Internal Medicine

## 2019-06-24 ENCOUNTER — Inpatient Hospital Stay: Payer: Medicaid Other

## 2019-06-24 ENCOUNTER — Telehealth: Payer: Self-pay | Admitting: Internal Medicine

## 2019-06-24 ENCOUNTER — Encounter: Payer: Self-pay | Admitting: Internal Medicine

## 2019-06-24 VITALS — BP 133/87

## 2019-06-24 VITALS — BP 137/99 | HR 98 | Temp 98.5°F | Resp 20 | Ht 67.0 in | Wt 236.1 lb

## 2019-06-24 DIAGNOSIS — Z79899 Other long term (current) drug therapy: Secondary | ICD-10-CM | POA: Diagnosis not present

## 2019-06-24 DIAGNOSIS — C3492 Malignant neoplasm of unspecified part of left bronchus or lung: Secondary | ICD-10-CM

## 2019-06-24 DIAGNOSIS — C3412 Malignant neoplasm of upper lobe, left bronchus or lung: Secondary | ICD-10-CM | POA: Insufficient documentation

## 2019-06-24 DIAGNOSIS — Z5112 Encounter for antineoplastic immunotherapy: Secondary | ICD-10-CM

## 2019-06-24 DIAGNOSIS — Z5111 Encounter for antineoplastic chemotherapy: Secondary | ICD-10-CM

## 2019-06-24 LAB — CMP (CANCER CENTER ONLY)
ALT: 23 U/L (ref 0–44)
AST: 23 U/L (ref 15–41)
Albumin: 4.1 g/dL (ref 3.5–5.0)
Alkaline Phosphatase: 118 U/L (ref 38–126)
Anion gap: 12 (ref 5–15)
BUN: 9 mg/dL (ref 6–20)
CO2: 24 mmol/L (ref 22–32)
Calcium: 9.3 mg/dL (ref 8.9–10.3)
Chloride: 99 mmol/L (ref 98–111)
Creatinine: 0.77 mg/dL (ref 0.61–1.24)
GFR, Est AFR Am: >60 mL/min (ref >60)
GFR, Estimated: >60 mL/min (ref >60)
Glucose, Bld: 96 mg/dL (ref 70–99)
Potassium: 4.1 mmol/L (ref 3.5–5.1)
Sodium: 135 mmol/L (ref 135–145)
Total Bilirubin: 0.4 mg/dL (ref 0.3–1.2)
Total Protein: 8.3 g/dL — ABNORMAL HIGH (ref 6.5–8.1)

## 2019-06-24 LAB — CBC WITH DIFFERENTIAL (CANCER CENTER ONLY)
Abs Immature Granulocytes: 0.01 10*3/uL (ref 0.00–0.07)
Basophils Absolute: 0 10*3/uL (ref 0.0–0.1)
Basophils Relative: 0 %
Eosinophils Absolute: 0.2 10*3/uL (ref 0.0–0.5)
Eosinophils Relative: 3 %
HCT: 34.7 % — ABNORMAL LOW (ref 39.0–52.0)
Hemoglobin: 11.2 g/dL — ABNORMAL LOW (ref 13.0–17.0)
Immature Granulocytes: 0 %
Lymphocytes Relative: 38 %
Lymphs Abs: 2.7 10*3/uL (ref 0.7–4.0)
MCH: 33.1 pg (ref 26.0–34.0)
MCHC: 32.3 g/dL (ref 30.0–36.0)
MCV: 102.7 fL — ABNORMAL HIGH (ref 80.0–100.0)
Monocytes Absolute: 1.1 10*3/uL — ABNORMAL HIGH (ref 0.1–1.0)
Monocytes Relative: 15 %
Neutro Abs: 3 10*3/uL (ref 1.7–7.7)
Neutrophils Relative %: 44 %
Platelet Count: 315 10*3/uL (ref 150–400)
RBC: 3.38 MIL/uL — ABNORMAL LOW (ref 4.22–5.81)
RDW: 15.1 % (ref 11.5–15.5)
WBC Count: 6.9 10*3/uL (ref 4.0–10.5)
nRBC: 0 % (ref 0.0–0.2)

## 2019-06-24 LAB — TSH: TSH: 1.196 u[IU]/mL (ref 0.320–4.118)

## 2019-06-24 MED ORDER — SODIUM CHLORIDE 0.9 % IV SOLN
500.0000 mg/m2 | Freq: Once | INTRAVENOUS | Status: AC
  Administered 2019-06-24: 1100 mg via INTRAVENOUS
  Filled 2019-06-24: qty 40

## 2019-06-24 MED ORDER — SODIUM CHLORIDE 0.9 % IV SOLN
Freq: Once | INTRAVENOUS | Status: AC
  Administered 2019-06-24: 10:00:00 via INTRAVENOUS
  Filled 2019-06-24: qty 250

## 2019-06-24 MED ORDER — CYANOCOBALAMIN 1000 MCG/ML IJ SOLN
INTRAMUSCULAR | Status: AC
  Filled 2019-06-24: qty 1

## 2019-06-24 MED ORDER — PROCHLORPERAZINE MALEATE 10 MG PO TABS
ORAL_TABLET | ORAL | Status: AC
  Filled 2019-06-24: qty 1

## 2019-06-24 MED ORDER — SODIUM CHLORIDE 0.9 % IV SOLN
200.0000 mg | Freq: Once | INTRAVENOUS | Status: AC
  Administered 2019-06-24: 10:00:00 200 mg via INTRAVENOUS
  Filled 2019-06-24: qty 8

## 2019-06-24 MED ORDER — CYANOCOBALAMIN 1000 MCG/ML IJ SOLN
1000.0000 ug | Freq: Once | INTRAMUSCULAR | Status: AC
  Administered 2019-06-24: 1000 ug via INTRAMUSCULAR

## 2019-06-24 MED ORDER — PROCHLORPERAZINE MALEATE 10 MG PO TABS
10.0000 mg | ORAL_TABLET | Freq: Once | ORAL | Status: AC
  Administered 2019-06-24: 10 mg via ORAL

## 2019-06-24 NOTE — Progress Notes (Signed)
[facility] [phone]   [phone]  OFFICE PROGRESS NOTE  Willie Schmidt, [address] [place] [address]  DIAGNOSIS: Stage IV (T2b, N3, M1 C) non-small cell lung cancer, adenocarcinoma presented with left upper lobe lung mass in addition to mediastinal and left supraclavicular lymphadenopathy as well as bilateral pulmonary nodules and suspicious bone lesion diagnosed in March 2020.  PRIOR THERAPY: None  CURRENT THERAPY: Systemic chemotherapy with carboplatin for AUC of 5, Alimta 500 mg/M2 and Keytruda 200 mg IV every 3 weeks.  First dose March 11, 2019.  Status post 5 cycles.  INTERVAL HISTORY: Willie Schmidt 58 y.o. male returns to the clinic today for follow-up visit.  The patient is feeling fine today with no concerning complaints except for the persistent right shoulder pain.  He denied having any chest pain, shortness of breath, cough or hemoptysis.  He denied having any nausea, vomiting, diarrhea or constipation.  He denied having any headache or visual changes.  He has no weight loss or night sweats.  He is here today for evaluation before starting cycle #6 of his treatment.  MEDICAL HISTORY: Past Medical History:  Diagnosis Date  . Bronchitis, chronic ([place])   . Hypertension   . metastatic lung ca dx'd 02/2019   lyphadenopathy, bil pulmonary noduels; suspected bone lesions    ALLERGIES:  has No Known Allergies.  MEDICATIONS:  Current Outpatient Medications  Medication Sig Dispense Refill  . albuterol (PROVENTIL HFA;VENTOLIN HFA) 108 (90 BASE) MCG/ACT inhaler Inhale 2 puffs into the lungs every 6 (six) hours as needed for wheezing. (Patient not taking: Reported on 03/03/2019) 1 Inhaler 2  . aspirin 81 MG tablet Take 81 mg by mouth daily.    . cyclobenzaprine (FLEXERIL) 10 MG tablet TAKE 1 TABLET BY MOUTH THREE TIMES A DAY AS NEEDED FOR MUSCLE SPASMS 30 tablet 0  . DULoxetine (CYMBALTA) 30 MG capsule 1 once daily for 1 week  then increase to 1 twice daily 60 capsule 3  . folic acid (FOLVITE) 1 MG tablet Take 1 tablet (1 mg total) by mouth daily. 30 tablet 4  . gabapentin (NEURONTIN) 300 MG capsule Take 1 capsule (300 mg total) by mouth 3 (three) times daily. 90 capsule 3  . oxyCODONE-acetaminophen (PERCOCET/ROXICET) 5-325 MG tablet Take 1 tablet by mouth every 8 (eight) hours as needed for severe pain. 30 tablet 0  . prochlorperazine (COMPAZINE) 10 MG tablet Take 1 tablet (10 mg total) by mouth every 6 (six) hours as needed for nausea or vomiting. 30 tablet 0   No current facility-administered medications for this visit.     SURGICAL HISTORY: No past surgical history on file.  REVIEW OF SYSTEMS:  A comprehensive review of systems was negative except for: Musculoskeletal: positive for Right shoulder pain   PHYSICAL EXAMINATION: General appearance: alert, cooperative, fatigued and no distress Head: Normocephalic, without obvious abnormality, atraumatic Neck: no adenopathy, no JVD, supple, symmetrical, trachea midline and thyroid not enlarged, symmetric, no tenderness/mass/nodules Lymph nodes: Cervical, supraclavicular, and axillary nodes normal. Resp: clear to auscultation bilaterally Back: symmetric, no curvature. ROM normal. No CVA tenderness. Cardio: regular rate and rhythm, S1, S2 normal, no murmur, click, rub or gallop GI: soft, non-tender; bowel sounds normal; no masses,  no organomegaly Extremities: extremities normal, atraumatic, no cyanosis or edema  ECOG PERFORMANCE STATUS: 1 - Symptomatic but completely ambulatory  Blood pressure (!) 137/99, pulse 98, temperature 98.5 F (36.9 C), temperature source Oral, resp.  rate 20, height 5\' 7"  (1.702 m), weight 236 lb 1.6 oz (107.1 kg), SpO2 100 %.  LABORATORY DATA: Lab Results  Component Value Date   WBC 6.9 06/24/2019   HGB 11.2 (L) 06/24/2019   HCT 34.7 (L) 06/24/2019   MCV 102.7 (H) 06/24/2019   PLT 315 06/24/2019      Chemistry      Component  Value Date/Time   NA 135 06/03/2019 0853   K 3.6 06/03/2019 0853   CL 100 06/03/2019 0853   CO2 22 06/03/2019 0853   BUN 8 06/03/2019 0853   CREATININE 0.76 06/03/2019 0853      Component Value Date/Time   CALCIUM 9.5 06/03/2019 0853   ALKPHOS 125 06/03/2019 0853   AST 24 06/03/2019 0853   ALT 19 06/03/2019 0853   BILITOT 0.4 06/03/2019 0853       RADIOGRAPHIC STUDIES: No results found.  ASSESSMENT AND PLAN: This is a very pleasant 58 years old African-American male with likely stage IV non-small cell lung cancer, adenocarcinoma presented with large left upper lobe lung mass in addition to mediastinal and left supraclavicular lymphadenopathy as well as suspicious bone metastasis and bilateral pulmonary nodules diagnosed in March 2020. The patient is currently undergoing systemic chemotherapy with carboplatin, Alimta and Keytruda status post 5 cycles.  Starting from cycle #5 he is receiving maintenance treatment with Alimta and Keytruda. The patient has been tolerating this treatment well with no concerning adverse effects. I recommended for him to proceed with cycle #6 today as planned. I will see him back for follow-up visit in 3 weeks for evaluation after repeating CT scan of the chest, abdomen and pelvis for restaging of his disease. He was advised to call immediately if he has any concerning symptoms in the interval. The patient voices understanding of current disease status and treatment options and is in agreement with the current care plan.  All questions were answered. The patient knows to call the clinic with any problems, questions or concerns. We can certainly see the patient much sooner if necessary.   Disclaimer: This note was dictated with voice recognition software. Similar sounding words can inadvertently be transcribed and may not be corrected upon review.

## 2019-06-24 NOTE — Patient Instructions (Signed)
[facility] Discharge Instructions for Patients Receiving Chemotherapy  Today you received the following chemotherapy agents Keytruda, Alimta.  To help prevent nausea and vomiting after your treatment, we encourage you to take your nausea medication: As directed by your MD   If you develop nausea and vomiting that is not controlled by your nausea medication, call the clinic.   BELOW ARE SYMPTOMS THAT SHOULD BE REPORTED IMMEDIATELY:  *FEVER GREATER THAN 100.5 F  *CHILLS WITH OR WITHOUT FEVER  NAUSEA AND VOMITING THAT IS NOT CONTROLLED WITH YOUR NAUSEA MEDICATION  *UNUSUAL SHORTNESS OF BREATH  *UNUSUAL BRUISING OR BLEEDING  TENDERNESS IN MOUTH AND THROAT WITH OR WITHOUT PRESENCE OF ULCERS  *URINARY PROBLEMS  *BOWEL PROBLEMS  UNUSUAL RASH Items with * indicate a potential emergency and should be followed up as soon as possible.  Feel free to call the clinic should you have any questions or concerns. The clinic phone number is [phone].  Please show the [facility] at check-in to the Emergency Department and triage nurse.

## 2019-06-24 NOTE — Telephone Encounter (Signed)
Scheduled appt per 7/2 los - pt to get an updated schedule next visit.

## 2019-06-28 ENCOUNTER — Other Ambulatory Visit: Payer: Self-pay | Admitting: Internal Medicine

## 2019-06-28 ENCOUNTER — Telehealth: Payer: Self-pay | Admitting: Medical Oncology

## 2019-06-28 ENCOUNTER — Other Ambulatory Visit: Payer: Self-pay | Admitting: Physician Assistant

## 2019-06-28 DIAGNOSIS — C3492 Malignant neoplasm of unspecified part of left bronchus or lung: Secondary | ICD-10-CM

## 2019-06-28 MED ORDER — OXYCODONE-ACETAMINOPHEN 5-325 MG PO TABS: 1.0000 | ORAL_TABLET | Freq: Three times a day (TID) | ORAL | 0 refills | Status: DC | PRN

## 2019-06-28 NOTE — Telephone Encounter (Signed)
Pain med refill requested .

## 2019-07-01 ENCOUNTER — Other Ambulatory Visit: Payer: Medicaid Other

## 2019-07-06 ENCOUNTER — Telehealth: Payer: Self-pay | Admitting: *Deleted

## 2019-07-06 NOTE — Telephone Encounter (Signed)
Received call from pt's daughter.  She is inquiring about about pt's current status and treatment plan. Advised that his treatment plan is unchanged for now.  Pt is scheduled for CT scans on 07/12/19 @ 8am and then he has a follow up appt with Dr. Julien Nordmann on 07/15/19.  Advised that Dr. Julien Nordmann would review pt's scan that day and make any needed adjustments to his treatment plan at that time.  Advised daughter that we still have visitor restrictions but her father can call her during his appt so that she can listen in to the visit and ask any questions.  She voiced understanding.

## 2019-07-08 ENCOUNTER — Other Ambulatory Visit: Payer: Medicaid Other

## 2019-07-12 ENCOUNTER — Other Ambulatory Visit: Payer: Self-pay

## 2019-07-12 ENCOUNTER — Ambulatory Visit (HOSPITAL_COMMUNITY)
Admission: RE | Admit: 2019-07-12 | Discharge: 2019-07-12 | Disposition: A | Discharge status: Home / Self Care | Payer: Medicaid Other | Source: Ambulatory Visit | Attending: Physician Assistant | Admitting: Physician Assistant

## 2019-07-12 ENCOUNTER — Encounter (HOSPITAL_COMMUNITY): Payer: Self-pay

## 2019-07-12 DIAGNOSIS — C3492 Malignant neoplasm of unspecified part of left bronchus or lung: Secondary | ICD-10-CM

## 2019-07-12 MED ORDER — SODIUM CHLORIDE (PF) 0.9 % IJ SOLN
INTRAMUSCULAR | Status: AC
  Filled 2019-07-12: qty 50

## 2019-07-12 MED ORDER — IOHEXOL 300 MG/ML  SOLN
100.0000 mL | Freq: Once | INTRAMUSCULAR | Status: AC | PRN
  Administered 2019-07-12: 100 mL via INTRAVENOUS

## 2019-07-15 ENCOUNTER — Inpatient Hospital Stay (HOSPITAL_BASED_OUTPATIENT_CLINIC_OR_DEPARTMENT_OTHER): Payer: Medicaid Other | Admitting: Medical

## 2019-07-15 ENCOUNTER — Inpatient Hospital Stay (HOSPITAL_BASED_OUTPATIENT_CLINIC_OR_DEPARTMENT_OTHER): Payer: Medicaid Other | Admitting: Internal Medicine

## 2019-07-15 ENCOUNTER — Other Ambulatory Visit: Payer: Self-pay

## 2019-07-15 ENCOUNTER — Telehealth: Payer: Self-pay | Admitting: Internal Medicine

## 2019-07-15 ENCOUNTER — Inpatient Hospital Stay: Payer: Medicaid Other

## 2019-07-15 ENCOUNTER — Encounter: Payer: Self-pay | Admitting: Internal Medicine

## 2019-07-15 ENCOUNTER — Telehealth: Payer: Self-pay | Admitting: Medical Oncology

## 2019-07-15 ENCOUNTER — Other Ambulatory Visit: Payer: Self-pay | Admitting: Internal Medicine

## 2019-07-15 VITALS — BP 130/97 | HR 101

## 2019-07-15 VITALS — BP 139/97 | HR 105 | Temp 97.8°F | Resp 16 | Ht 67.0 in | Wt 238.9 lb

## 2019-07-15 DIAGNOSIS — C3492 Malignant neoplasm of unspecified part of left bronchus or lung: Secondary | ICD-10-CM

## 2019-07-15 DIAGNOSIS — Z5111 Encounter for antineoplastic chemotherapy: Secondary | ICD-10-CM

## 2019-07-15 DIAGNOSIS — M25511 Pain in right shoulder: Secondary | ICD-10-CM

## 2019-07-15 DIAGNOSIS — Z5112 Encounter for antineoplastic immunotherapy: Secondary | ICD-10-CM | POA: Diagnosis not present

## 2019-07-15 DIAGNOSIS — C3412 Malignant neoplasm of upper lobe, left bronchus or lung: Secondary | ICD-10-CM

## 2019-07-15 DIAGNOSIS — M542 Cervicalgia: Secondary | ICD-10-CM

## 2019-07-15 LAB — CMP (CANCER CENTER ONLY)
ALT: 28 U/L (ref 0–44)
AST: 28 U/L (ref 15–41)
Albumin: 4.2 g/dL (ref 3.5–5.0)
Alkaline Phosphatase: 125 U/L (ref 38–126)
Anion gap: 11 (ref 5–15)
BUN: 13 mg/dL (ref 6–20)
CO2: 26 mmol/L (ref 22–32)
Calcium: 9.9 mg/dL (ref 8.9–10.3)
Chloride: 98 mmol/L (ref 98–111)
Creatinine: 0.84 mg/dL (ref 0.61–1.24)
GFR, Est AFR Am: >60 mL/min (ref >60)
GFR, Estimated: >60 mL/min (ref >60)
Glucose, Bld: 101 mg/dL — ABNORMAL HIGH (ref 70–99)
Potassium: 3.6 mmol/L (ref 3.5–5.1)
Sodium: 135 mmol/L (ref 135–145)
Total Bilirubin: 0.4 mg/dL (ref 0.3–1.2)
Total Protein: 8.8 g/dL — ABNORMAL HIGH (ref 6.5–8.1)

## 2019-07-15 LAB — CBC WITH DIFFERENTIAL (CANCER CENTER ONLY)
Abs Immature Granulocytes: 0.03 10*3/uL (ref 0.00–0.07)
Basophils Absolute: 0 10*3/uL (ref 0.0–0.1)
Basophils Relative: 0 %
Eosinophils Absolute: 0.3 10*3/uL (ref 0.0–0.5)
Eosinophils Relative: 3 %
HCT: 38.2 % — ABNORMAL LOW (ref 39.0–52.0)
Hemoglobin: 12.3 g/dL — ABNORMAL LOW (ref 13.0–17.0)
Immature Granulocytes: 0 %
Lymphocytes Relative: 31 %
Lymphs Abs: 3.2 10*3/uL (ref 0.7–4.0)
MCH: 33.3 pg (ref 26.0–34.0)
MCHC: 32.2 g/dL (ref 30.0–36.0)
MCV: 103.5 fL — ABNORMAL HIGH (ref 80.0–100.0)
Monocytes Absolute: 1.3 10*3/uL — ABNORMAL HIGH (ref 0.1–1.0)
Monocytes Relative: 12 %
Neutro Abs: 5.7 10*3/uL (ref 1.7–7.7)
Neutrophils Relative %: 54 %
Platelet Count: 334 10*3/uL (ref 150–400)
RBC: 3.69 MIL/uL — ABNORMAL LOW (ref 4.22–5.81)
RDW: 13.6 % (ref 11.5–15.5)
WBC Count: 10.5 10*3/uL (ref 4.0–10.5)
nRBC: 0 % (ref 0.0–0.2)

## 2019-07-15 LAB — TSH: TSH: 1.925 u[IU]/mL (ref 0.320–4.118)

## 2019-07-15 MED ORDER — SODIUM CHLORIDE 0.9 % IV SOLN
200.0000 mg | Freq: Once | INTRAVENOUS | Status: AC
  Administered 2019-07-15: 12:00:00 200 mg via INTRAVENOUS
  Filled 2019-07-15: qty 8

## 2019-07-15 MED ORDER — SODIUM CHLORIDE 0.9 % IV SOLN
500.0000 mg/m2 | Freq: Once | INTRAVENOUS | Status: AC
  Administered 2019-07-15: 1100 mg via INTRAVENOUS
  Filled 2019-07-15: qty 4

## 2019-07-15 MED ORDER — HEPARIN SOD (PORK) LOCK FLUSH 100 UNIT/ML IV SOLN
500.0000 [IU] | Freq: Once | INTRAVENOUS | Status: DC | PRN
  Filled 2019-07-15: qty 5

## 2019-07-15 MED ORDER — OXYCODONE-ACETAMINOPHEN 5-325 MG PO TABS: 1.0000 | ORAL_TABLET | Freq: Three times a day (TID) | ORAL | 0 refills | Status: DC | PRN

## 2019-07-15 MED ORDER — SODIUM CHLORIDE 0.9% FLUSH
10.0000 mL | INTRAVENOUS | Status: DC | PRN
  Filled 2019-07-15: qty 10

## 2019-07-15 MED ORDER — PROCHLORPERAZINE MALEATE 10 MG PO TABS
ORAL_TABLET | ORAL | Status: AC
  Filled 2019-07-15: qty 1

## 2019-07-15 MED ORDER — SODIUM CHLORIDE 0.9 % IV SOLN
Freq: Once | INTRAVENOUS | Status: AC
  Administered 2019-07-15: 11:00:00 via INTRAVENOUS
  Filled 2019-07-15: qty 250

## 2019-07-15 MED ORDER — PROCHLORPERAZINE MALEATE 10 MG PO TABS
10.0000 mg | ORAL_TABLET | Freq: Once | ORAL | Status: AC
  Administered 2019-07-15: 10 mg via ORAL

## 2019-07-15 NOTE — Telephone Encounter (Signed)
Scheduled appt per 7/23 los - pt to get an updated sch message next visit

## 2019-07-15 NOTE — Telephone Encounter (Signed)
I did order MRI of the shoulder but he probably will need a follow-up with his orthopedic surgeon.

## 2019-07-15 NOTE — Progress Notes (Signed)
I was asked to see the patient in infusion today. He reports right neck and shoulder pain. He asks what can be done. The patient's chart was reviewed and he was told that Dr. Julien Nordmann reordered pain medications and was ordering an MRI for further evaluation.  Sandi Mealy, MHS, PA-C Physician Assistant

## 2019-07-15 NOTE — Progress Notes (Signed)
Per Dr. Julien Nordmann, okay for patient to receive treatment today with blood pressure 130/97 and pulse of 101.

## 2019-07-15 NOTE — Patient Instructions (Signed)
[facility] Discharge Instructions for Patients Receiving Chemotherapy  Today you received the following chemotherapy agents Keytruda, Alimta.  To help prevent nausea and vomiting after your treatment, we encourage you to take your nausea medication: As directed by your MD   If you develop nausea and vomiting that is not controlled by your nausea medication, call the clinic.   BELOW ARE SYMPTOMS THAT SHOULD BE REPORTED IMMEDIATELY:  *FEVER GREATER THAN 100.5 F  *CHILLS WITH OR WITHOUT FEVER  NAUSEA AND VOMITING THAT IS NOT CONTROLLED WITH YOUR NAUSEA MEDICATION  *UNUSUAL SHORTNESS OF BREATH  *UNUSUAL BRUISING OR BLEEDING  TENDERNESS IN MOUTH AND THROAT WITH OR WITHOUT PRESENCE OF ULCERS  *URINARY PROBLEMS  *BOWEL PROBLEMS  UNUSUAL RASH Items with * indicate a potential emergency and should be followed up as soon as possible.  Feel free to call the clinic should you have any questions or concerns. The clinic phone number is [phone].  Please show the [facility] at check-in to the Emergency Department and triage nurse.

## 2019-07-15 NOTE — Telephone Encounter (Signed)
Dtr stated he started drinking beer again in May- at least 40 oz /day. I updated her on visit today.  Does Neizan need to schedule appt with orthopedic ?

## 2019-07-15 NOTE — Progress Notes (Signed)
[place] [phone]   [phone]  OFFICE PROGRESS NOTE  [name], [address] [place] [id number]  DIAGNOSIS: Stage IV (T2b, N3, M1 C) non-small cell lung cancer, adenocarcinoma presented with left upper lobe lung mass in addition to mediastinal and left supraclavicular lymphadenopathy as well as bilateral pulmonary nodules and suspicious bone lesion diagnosed in March 2020.  PRIOR THERAPY: None  CURRENT THERAPY: Systemic chemotherapy with carboplatin for AUC of 5, Alimta 500 mg/M2 and Keytruda 200 mg IV every 3 weeks.  First dose March 11, 2019.  Status post 6 cycles.  Starting from cycle #5 the patient is on maintenance treatment with Alimta and Keytruda every 3 weeks.  INTERVAL HISTORY: Willie Schmidt 58 y.o. male returns to the clinic today for follow-up visit.  The patient is feeling fine with no concerning complaints except for the persistent right shoulder pain.  He is requesting refill of oxycodone.  He denied having any chest pain, shortness of breath, cough or hemoptysis.  He denied having any fever or chills.  He has no nausea, vomiting, diarrhea or constipation.  He has no headache or visual changes.  He continues to tolerate his treatment with maintenance Alimta and Keytruda fairly well.  The patient had repeat CT scan of the chest, abdomen pelvis performed recently and he is here for evaluation and discussion of his scan results.  MEDICAL HISTORY: Past Medical History:  Diagnosis Date   Bronchitis, chronic ([place])    Hypertension    metastatic lung ca dx'd 02/2019   lyphadenopathy, bil pulmonary noduels; suspected bone lesions    ALLERGIES:  has No Known Allergies.  MEDICATIONS:  Current Outpatient Medications  Medication Sig Dispense Refill   albuterol (PROVENTIL HFA;VENTOLIN HFA) 108 (90 BASE) MCG/ACT inhaler Inhale 2 puffs into the lungs every 6 (six) hours as needed for wheezing. (Patient not taking:  Reported on 03/03/2019) 1 Inhaler 2   aspirin 81 MG tablet Take 81 mg by mouth daily.     cyclobenzaprine (FLEXERIL) 10 MG tablet TAKE 1 TABLET BY MOUTH THREE TIMES A DAY AS NEEDED FOR MUSCLE SPASMS 30 tablet 0   DULoxetine (CYMBALTA) 30 MG capsule 1 once daily for 1 week then increase to 1 twice daily 60 capsule 3   folic acid (FOLVITE) 1 MG tablet Take 1 tablet (1 mg total) by mouth daily. 30 tablet 4   gabapentin (NEURONTIN) 300 MG capsule Take 1 capsule (300 mg total) by mouth 3 (three) times daily. 90 capsule 3   oxyCODONE-acetaminophen (PERCOCET/ROXICET) 5-325 MG tablet Take 1 tablet by mouth every 8 (eight) hours as needed for severe pain. 30 tablet 0   prochlorperazine (COMPAZINE) 10 MG tablet TAKE 1 TABLET(10 MG) BY MOUTH EVERY 6 HOURS AS NEEDED FOR NAUSEA OR VOMITING 30 tablet 0   No current facility-administered medications for this visit.     SURGICAL HISTORY: History reviewed. No pertinent surgical history.  REVIEW OF SYSTEMS:  Constitutional: positive for fatigue Eyes: negative Ears, nose, mouth, throat, and face: negative Respiratory: negative Cardiovascular: negative Gastrointestinal: negative Genitourinary:negative Integument/breast: negative Hematologic/lymphatic: negative Musculoskeletal:positive for Right shoulder pain Neurological: negative Behavioral/Psych: negative Endocrine: negative Allergic/Immunologic: negative   PHYSICAL EXAMINATION: General appearance: alert, cooperative, fatigued and no distress Head: Normocephalic, without obvious abnormality, atraumatic Neck: no adenopathy, no JVD, supple, symmetrical, trachea midline and thyroid not enlarged, symmetric, no tenderness/mass/nodules Lymph nodes: Cervical, supraclavicular, and axillary nodes normal. Resp: clear to auscultation bilaterally Back: symmetric,  no curvature. ROM normal. No CVA tenderness. Cardio: regular rate and rhythm, S1, S2 normal, no murmur, click, rub or gallop GI: soft,  non-tender; bowel sounds normal; no masses,  no organomegaly Extremities: extremities normal, atraumatic, no cyanosis or edema Neurologic: Alert and oriented X 3, normal strength and tone. Normal symmetric reflexes. Normal coordination and gait  ECOG PERFORMANCE STATUS: 1 - Symptomatic but completely ambulatory  Blood pressure (!) 139/97, pulse (!) 105, temperature 97.8 F (36.6 C), temperature source Temporal, resp. rate 16, height 5\' 7"  (1.702 m), weight 238 lb 14.4 oz (108.4 kg), SpO2 100 %.  LABORATORY DATA: Lab Results  Component Value Date   WBC 10.5 07/15/2019   HGB 12.3 (L) 07/15/2019   HCT 38.2 (L) 07/15/2019   MCV 103.5 (H) 07/15/2019   PLT 334 07/15/2019      Chemistry      Component Value Date/Time   NA 135 07/15/2019 0911   K 3.6 07/15/2019 0911   CL 98 07/15/2019 0911   CO2 26 07/15/2019 0911   BUN 13 07/15/2019 0911   CREATININE 0.84 07/15/2019 0911      Component Value Date/Time   CALCIUM 9.9 07/15/2019 0911   ALKPHOS 125 07/15/2019 0911   AST 28 07/15/2019 0911   ALT 28 07/15/2019 0911   BILITOT 0.4 07/15/2019 0911       RADIOGRAPHIC STUDIES: Ct Chest W Contrast  Result Date: 07/12/2019 CLINICAL DATA:  Lung cancer, ongoing chemotherapy and immunotherapy. EXAM: CT CHEST, ABDOMEN, AND PELVIS WITH CONTRAST TECHNIQUE: Multidetector CT imaging of the chest, abdomen and pelvis was performed following the standard protocol during bolus administration of intravenous contrast. CONTRAST:  164mL OMNIPAQUE IOHEXOL 300 MG/ML  SOLN COMPARISON:  CT chest 05/11/2019 and CT abdomen 03/31/2019. FINDINGS: CT CHEST FINDINGS Cardiovascular: Atherosclerotic calcification of the aorta, aortic valve and coronary arteries. Heart is mildly enlarged. No pericardial effusion. Mediastinum/Nodes: No pathologically enlarged mediastinal, hilar or axillary lymph nodes. Esophagus is grossly unremarkable. Lungs/Pleura: New and enlarging left upper lobe nodules. An 11 mm (10 x 12 mm, series 6,  image 51) nodule in the left upper lobe is new. Nodules in the posterior left upper lobe have decreased slightly in size, measuring 9 mm (51), previously 11 mm. Post treatment architectural distortion and volume loss in the surrounding left upper lobe. Ground-glass nodule in the posterior segment right upper lobe measures 10 mm (51), similar. No pleural fluid. Airway is unremarkable. Musculoskeletal: Vague rounded sclerosis is seen in the central aspect of the T10 vertebral body, as before. CT ABDOMEN PELVIS FINDINGS Hepatobiliary: Millimetric low-attenuation lesion in the right hepatic lobe is unchanged. Liver and gallbladder are otherwise unremarkable. No biliary ductal dilatation. Pancreas: 1.4 cm heterogeneous low-attenuation lesion in the pancreatic tail, similar, with new distal atrophy (series 2, images 57-58). No ductal dilatation. Remainder the pancreas is unremarkable. Spleen: Negative. Adrenals/Urinary Tract: Adrenal glands and kidneys are unremarkable. Ureters are decompressed. Bladder is grossly unremarkable. Stomach/Bowel: Stomach, small bowel, appendix and colon are unremarkable. Vascular/Lymphatic: Atherosclerotic calcification of the aorta. Abdominal peritoneal ligament, periportal and retroperitoneal lymph nodes are not enlarged by CT size criteria. Reproductive: Prostate is visualized. Other: No free fluid.  Mesenteries and peritoneum are unremarkable. Musculoskeletal: There may be a sclerotic synovial herniation pit in the proximal right femur. Probable mixed lytic and sclerotic metastasis with superior endplate compression involving the posterior aspect of L5, partially imaged previously. Rounded lucency is seen in the lateral aspect of the right femoral neck (series 4, image 86), indeterminate. IMPRESSION: 1. Overall,  slight disease progression, as evidenced by increasing nodularity in the left upper lobe. 2. Pancreatic tail lesion is similar but with new distal atrophy. Metastatic disease is  favored over primary malignancy. 3. Osseous metastatic disease, similar were imaged previously. 4. 10 mm ground-glass nodule in the posterior segment right upper lobe, similar. Malignancy is not excluded. 5. Aortic atherosclerosis (ICD10-170.0). Coronary artery calcification. Electronically Signed   By: Lorin Picket M.D.   On: 07/12/2019 09:23   Ct Abdomen Pelvis W Contrast  Result Date: 07/12/2019 CLINICAL DATA:  Lung cancer, ongoing chemotherapy and immunotherapy. EXAM: CT CHEST, ABDOMEN, AND PELVIS WITH CONTRAST TECHNIQUE: Multidetector CT imaging of the chest, abdomen and pelvis was performed following the standard protocol during bolus administration of intravenous contrast. CONTRAST:  173mL OMNIPAQUE IOHEXOL 300 MG/ML  SOLN COMPARISON:  CT chest 05/11/2019 and CT abdomen 03/31/2019. FINDINGS: CT CHEST FINDINGS Cardiovascular: Atherosclerotic calcification of the aorta, aortic valve and coronary arteries. Heart is mildly enlarged. No pericardial effusion. Mediastinum/Nodes: No pathologically enlarged mediastinal, hilar or axillary lymph nodes. Esophagus is grossly unremarkable. Lungs/Pleura: New and enlarging left upper lobe nodules. An 11 mm (10 x 12 mm, series 6, image 51) nodule in the left upper lobe is new. Nodules in the posterior left upper lobe have decreased slightly in size, measuring 9 mm (51), previously 11 mm. Post treatment architectural distortion and volume loss in the surrounding left upper lobe. Ground-glass nodule in the posterior segment right upper lobe measures 10 mm (51), similar. No pleural fluid. Airway is unremarkable. Musculoskeletal: Vague rounded sclerosis is seen in the central aspect of the T10 vertebral body, as before. CT ABDOMEN PELVIS FINDINGS Hepatobiliary: Millimetric low-attenuation lesion in the right hepatic lobe is unchanged. Liver and gallbladder are otherwise unremarkable. No biliary ductal dilatation. Pancreas: 1.4 cm heterogeneous low-attenuation lesion in the  pancreatic tail, similar, with new distal atrophy (series 2, images 57-58). No ductal dilatation. Remainder the pancreas is unremarkable. Spleen: Negative. Adrenals/Urinary Tract: Adrenal glands and kidneys are unremarkable. Ureters are decompressed. Bladder is grossly unremarkable. Stomach/Bowel: Stomach, small bowel, appendix and colon are unremarkable. Vascular/Lymphatic: Atherosclerotic calcification of the aorta. Abdominal peritoneal ligament, periportal and retroperitoneal lymph nodes are not enlarged by CT size criteria. Reproductive: Prostate is visualized. Other: No free fluid.  Mesenteries and peritoneum are unremarkable. Musculoskeletal: There may be a sclerotic synovial herniation pit in the proximal right femur. Probable mixed lytic and sclerotic metastasis with superior endplate compression involving the posterior aspect of L5, partially imaged previously. Rounded lucency is seen in the lateral aspect of the right femoral neck (series 4, image 86), indeterminate. IMPRESSION: 1. Overall, slight disease progression, as evidenced by increasing nodularity in the left upper lobe. 2. Pancreatic tail lesion is similar but with new distal atrophy. Metastatic disease is favored over primary malignancy. 3. Osseous metastatic disease, similar were imaged previously. 4. 10 mm ground-glass nodule in the posterior segment right upper lobe, similar. Malignancy is not excluded. 5. Aortic atherosclerosis (ICD10-170.0). Coronary artery calcification. Electronically Signed   By: Lorin Picket M.D.   On: 07/12/2019 09:23    ASSESSMENT AND PLAN: This is a very pleasant 58 years old African-American male with likely stage IV non-small cell lung cancer, adenocarcinoma presented with large left upper lobe lung mass in addition to mediastinal and left supraclavicular lymphadenopathy as well as suspicious bone metastasis and bilateral pulmonary nodules diagnosed in March 2020. The patient is currently undergoing systemic  chemotherapy with carboplatin, Alimta and Keytruda status post 6 cycles.  Starting from cycle #5  he is receiving maintenance treatment with Alimta and Keytruda. He has been tolerating this treatment well with no concerning adverse effects. The patient had repeat CT scan of the chest, abdomen pelvis that showed very mild disease progression with new 11 mm nodule in the left upper lobe but otherwise stable disease in the other areas. I personally and independently reviewed the scan images and discussed the results with the patient today. I recommended for the patient to continue his current maintenance treatment with Alimta and Keytruda and we will continue to monitor the new pulmonary nodule closely on upcoming imaging studies. For the persistent right shoulder pain, I will order MRI of the right shoulder to rule out any metastatic disease or other abnormalities causing his pain. I also recommended for the patient to see orthopedic surgery for evaluation. He will come back for follow-up visit in 3 weeks for evaluation before the next cycle of his treatment. He was advised to call immediately if he has any concerning symptoms in the interval. The patient voices understanding of current disease status and treatment options and is in agreement with the current care plan.  All questions were answered. The patient knows to call the clinic with any problems, questions or concerns. We can certainly see the patient much sooner if necessary.   Disclaimer: This note was dictated with voice recognition software. Similar sounding words can inadvertently be transcribed and may not be corrected upon review.

## 2019-07-19 NOTE — Telephone Encounter (Signed)
Pt instructed on MRI appt Friday and where to go.

## 2019-07-23 ENCOUNTER — Other Ambulatory Visit: Payer: Self-pay | Admitting: *Deleted

## 2019-07-23 ENCOUNTER — Ambulatory Visit (HOSPITAL_COMMUNITY): Payer: Medicaid Other

## 2019-08-04 ENCOUNTER — Other Ambulatory Visit: Payer: Self-pay | Admitting: Internal Medicine

## 2019-08-05 ENCOUNTER — Inpatient Hospital Stay (HOSPITAL_BASED_OUTPATIENT_CLINIC_OR_DEPARTMENT_OTHER): Payer: Medicaid Other | Admitting: Internal Medicine

## 2019-08-05 ENCOUNTER — Telehealth: Payer: Self-pay | Admitting: Medical Oncology

## 2019-08-05 ENCOUNTER — Other Ambulatory Visit: Payer: Self-pay | Admitting: Internal Medicine

## 2019-08-05 ENCOUNTER — Other Ambulatory Visit: Payer: Self-pay

## 2019-08-05 ENCOUNTER — Inpatient Hospital Stay: Payer: Medicaid Other

## 2019-08-05 ENCOUNTER — Inpatient Hospital Stay: Payer: Medicaid Other | Attending: Internal Medicine

## 2019-08-05 VITALS — BP 138/99 | HR 110 | Temp 98.3°F | Resp 18 | Ht 67.0 in | Wt 245.8 lb

## 2019-08-05 VITALS — BP 131/95 | HR 103

## 2019-08-05 DIAGNOSIS — Z79899 Other long term (current) drug therapy: Secondary | ICD-10-CM | POA: Insufficient documentation

## 2019-08-05 DIAGNOSIS — M542 Cervicalgia: Secondary | ICD-10-CM | POA: Diagnosis not present

## 2019-08-05 DIAGNOSIS — Z5112 Encounter for antineoplastic immunotherapy: Secondary | ICD-10-CM | POA: Diagnosis present

## 2019-08-05 DIAGNOSIS — C3492 Malignant neoplasm of unspecified part of left bronchus or lung: Secondary | ICD-10-CM

## 2019-08-05 DIAGNOSIS — C3412 Malignant neoplasm of upper lobe, left bronchus or lung: Secondary | ICD-10-CM | POA: Insufficient documentation

## 2019-08-05 DIAGNOSIS — G8929 Other chronic pain: Secondary | ICD-10-CM

## 2019-08-05 DIAGNOSIS — Z5111 Encounter for antineoplastic chemotherapy: Secondary | ICD-10-CM

## 2019-08-05 DIAGNOSIS — M25511 Pain in right shoulder: Secondary | ICD-10-CM | POA: Insufficient documentation

## 2019-08-05 LAB — CBC WITH DIFFERENTIAL (CANCER CENTER ONLY)
Abs Immature Granulocytes: 0.02 10*3/uL (ref 0.00–0.07)
Basophils Absolute: 0 10*3/uL (ref 0.0–0.1)
Basophils Relative: 0 %
Eosinophils Absolute: 0.4 10*3/uL (ref 0.0–0.5)
Eosinophils Relative: 4 %
HCT: 37.2 % — ABNORMAL LOW (ref 39.0–52.0)
Hemoglobin: 12.1 g/dL — ABNORMAL LOW (ref 13.0–17.0)
Immature Granulocytes: 0 %
Lymphocytes Relative: 32 %
Lymphs Abs: 2.9 10*3/uL (ref 0.7–4.0)
MCH: 32.7 pg (ref 26.0–34.0)
MCHC: 32.5 g/dL (ref 30.0–36.0)
MCV: 100.5 fL — ABNORMAL HIGH (ref 80.0–100.0)
Monocytes Absolute: 1.1 10*3/uL — ABNORMAL HIGH (ref 0.1–1.0)
Monocytes Relative: 12 %
Neutro Abs: 4.7 10*3/uL (ref 1.7–7.7)
Neutrophils Relative %: 52 %
Platelet Count: 278 10*3/uL (ref 150–400)
RBC: 3.7 MIL/uL — ABNORMAL LOW (ref 4.22–5.81)
RDW: 13.2 % (ref 11.5–15.5)
WBC Count: 9.1 10*3/uL (ref 4.0–10.5)
nRBC: 0 % (ref 0.0–0.2)

## 2019-08-05 LAB — CMP (CANCER CENTER ONLY)
ALT: 30 U/L (ref 0–44)
AST: 30 U/L (ref 15–41)
Albumin: 4.2 g/dL (ref 3.5–5.0)
Alkaline Phosphatase: 112 U/L (ref 38–126)
Anion gap: 12 (ref 5–15)
BUN: 9 mg/dL (ref 6–20)
CO2: 25 mmol/L (ref 22–32)
Calcium: 9.7 mg/dL (ref 8.9–10.3)
Chloride: 98 mmol/L (ref 98–111)
Creatinine: 0.84 mg/dL (ref 0.61–1.24)
GFR, Est AFR Am: >60 mL/min (ref >60)
GFR, Estimated: >60 mL/min (ref >60)
Glucose, Bld: 94 mg/dL (ref 70–99)
Potassium: 3.6 mmol/L (ref 3.5–5.1)
Sodium: 135 mmol/L (ref 135–145)
Total Bilirubin: 0.4 mg/dL (ref 0.3–1.2)
Total Protein: 8.4 g/dL — ABNORMAL HIGH (ref 6.5–8.1)

## 2019-08-05 LAB — TSH: TSH: 0.885 u[IU]/mL (ref 0.320–4.118)

## 2019-08-05 MED ORDER — PROCHLORPERAZINE MALEATE 10 MG PO TABS
ORAL_TABLET | ORAL | Status: AC
  Filled 2019-08-05: qty 1

## 2019-08-05 MED ORDER — CYCLOBENZAPRINE HCL 10 MG PO TABS: ORAL_TABLET | ORAL | 0 refills | Status: DC

## 2019-08-05 MED ORDER — DULOXETINE HCL 30 MG PO CPEP: ORAL_CAPSULE | ORAL | 3 refills | Status: DC

## 2019-08-05 MED ORDER — CYANOCOBALAMIN 1000 MCG/ML IJ SOLN
INTRAMUSCULAR | Status: AC
  Filled 2019-08-05: qty 1

## 2019-08-05 MED ORDER — SODIUM CHLORIDE 0.9 % IV SOLN
Freq: Once | INTRAVENOUS | Status: AC
  Administered 2019-08-05: 12:00:00 via INTRAVENOUS
  Filled 2019-08-05: qty 250

## 2019-08-05 MED ORDER — SODIUM CHLORIDE 0.9 % IV SOLN
200.0000 mg | Freq: Once | INTRAVENOUS | Status: AC
  Administered 2019-08-05: 200 mg via INTRAVENOUS
  Filled 2019-08-05: qty 8

## 2019-08-05 MED ORDER — CYANOCOBALAMIN 1000 MCG/ML IJ SOLN
1000.0000 ug | Freq: Once | INTRAMUSCULAR | Status: AC
  Administered 2019-08-05: 12:00:00 1000 ug via INTRAMUSCULAR

## 2019-08-05 MED ORDER — SODIUM CHLORIDE 0.9 % IV SOLN
500.0000 mg/m2 | Freq: Once | INTRAVENOUS | Status: AC
  Administered 2019-08-05: 1100 mg via INTRAVENOUS
  Filled 2019-08-05: qty 40

## 2019-08-05 MED ORDER — PROCHLORPERAZINE MALEATE 10 MG PO TABS
10.0000 mg | ORAL_TABLET | Freq: Once | ORAL | Status: AC
  Administered 2019-08-05: 10 mg via ORAL

## 2019-08-05 NOTE — Patient Instructions (Signed)
[facility] Discharge Instructions for Patients Receiving Chemotherapy  Today you received the following chemotherapy agents Pembrolizumab (KEYTRUDA) & Pemetrexed (ALIMTA).  To help prevent nausea and vomiting after your treatment, we encourage you to take your nausea medication as prescribed.    If you develop nausea and vomiting that is not controlled by your nausea medication, call the clinic.   BELOW ARE SYMPTOMS THAT SHOULD BE REPORTED IMMEDIATELY:  *FEVER GREATER THAN 100.5 F  *CHILLS WITH OR WITHOUT FEVER  NAUSEA AND VOMITING THAT IS NOT CONTROLLED WITH YOUR NAUSEA MEDICATION  *UNUSUAL SHORTNESS OF BREATH  *UNUSUAL BRUISING OR BLEEDING  TENDERNESS IN MOUTH AND THROAT WITH OR WITHOUT PRESENCE OF ULCERS  *URINARY PROBLEMS  *BOWEL PROBLEMS  UNUSUAL RASH Items with * indicate a potential emergency and should be followed up as soon as possible.  Feel free to call the clinic should you have any questions or concerns. The clinic phone number is [phone].  Please show the [facility] at check-in to the Emergency Department and triage nurse.  Coronavirus (COVID-19) Are you at risk?  Are you at risk for the Coronavirus (COVID-19)?  To be considered HIGH RISK for Coronavirus (COVID-19), you have to meet the following criteria:  . Traveled to [place], [place], [place], [place] or [place]; or in the [place] to [place], [place], [place], or [place]; and have fever, cough, and shortness of breath within the last 2 weeks of travel OR . Been in close contact with a person diagnosed with COVID-19 within the last 2 weeks and have fever, cough, and shortness of breath . IF YOU DO NOT MEET THESE CRITERIA, YOU ARE CONSIDERED LOW RISK FOR COVID-19.  What to do if you are HIGH RISK for COVID-19?  Marland Kitchen If you are having a medical emergency, call 911. . Seek medical care right away. Before you go to a doctor's office, urgent care or emergency department,  call ahead and tell them about your recent travel, contact with someone diagnosed with COVID-19, and your symptoms. You should receive instructions from your physician's office regarding next steps of care.  . When you arrive at healthcare provider, tell the healthcare staff immediately you have returned from visiting [place], [place], [place], [place] or [place]; or traveled in the [place] to [place], [place], [place], or [place]; in the last two weeks or you have been in close contact with a person diagnosed with COVID-19 in the last 2 weeks.   . Tell the health care staff about your symptoms: fever, cough and shortness of breath. . After you have been seen by a medical provider, you will be either: o Tested for (COVID-19) and discharged home on quarantine except to seek medical care if symptoms worsen, and asked to  - Stay home and avoid contact with others until you get your results (4-5 days)  - Avoid travel on public transportation if possible (such as bus, train, or airplane) or o Sent to the Emergency Department by EMS for evaluation, COVID-19 testing, and possible admission depending on your condition and test results.  What to do if you are LOW RISK for COVID-19?  Reduce your risk of any infection by using the same precautions used for avoiding the common cold or flu:  Marland Kitchen Wash your hands often with soap and warm water for at least 20 seconds.  If soap and water are not readily available, use an alcohol-based hand sanitizer with at least 60% alcohol.  Marland Kitchen  If coughing or sneezing, cover your mouth and nose by coughing or sneezing into the elbow areas of your shirt or coat, into a tissue or into your sleeve (not your hands). . Avoid shaking hands with others and consider head nods or verbal greetings only. . Avoid touching your eyes, nose, or mouth with unwashed hands.  . Avoid close contact with people who are sick. . Avoid places or events with large numbers of people in one  location, like concerts or sporting events. . Carefully consider travel plans you have or are making. . If you are planning any travel outside or inside the [place], visit the CDC's Travelers' Health webpage for the latest health notices. . If you have some symptoms but not all symptoms, continue to monitor at home and seek medical attention if your symptoms worsen. . If you are having a medical emergency, call 911.   [place] / e-Visit: [email or web address]         [facility]: [facility]: [phone]                   [facility]: [phone]

## 2019-08-05 NOTE — Progress Notes (Signed)
[facility] [phone]   Fax:([phone]  OFFICE PROGRESS NOTE  Willie Schmidt, [address] [place] [address]  DIAGNOSIS: Stage IV (T2b, N3, M1 C) non-small cell lung cancer, adenocarcinoma presented with left upper lobe lung mass in addition to mediastinal and left supraclavicular lymphadenopathy as well as bilateral pulmonary nodules and suspicious bone lesion diagnosed in March 2020.  PRIOR THERAPY: None  CURRENT THERAPY: Systemic chemotherapy with carboplatin for AUC of 5, Alimta 500 mg/M2 and Keytruda 200 mg IV every 3 weeks.  First dose March 11, 2019.  Status post 7 cycles.  Starting from cycle #5 the patient is on maintenance treatment with Alimta and Keytruda every 3 weeks.  INTERVAL HISTORY: Willie Schmidt 58 y.o. male returns to the clinic today for follow-up visit.  The patient is feeling fine today with no concerning complaints except for the persistent right shoulder pain.  I order MRI of the right shoulder but was declined by his insurance for unclear reason.  The patient denied having any current shortness of breath, cough or hemoptysis.  He denied having any fever or chills.  He has no nausea, vomiting, diarrhea or constipation.  He has no headache or visual changes.  He is here for evaluation before starting cycle #8.   MEDICAL HISTORY: Past Medical History:  Diagnosis Date   Bronchitis, chronic ([place])    Hypertension    metastatic lung ca dx'd 02/2019   lyphadenopathy, bil pulmonary noduels; suspected bone lesions    ALLERGIES:  has No Known Allergies.  MEDICATIONS:  Current Outpatient Medications  Medication Sig Dispense Refill   albuterol (PROVENTIL HFA;VENTOLIN HFA) 108 (90 BASE) MCG/ACT inhaler Inhale 2 puffs into the lungs every 6 (six) hours as needed for wheezing. (Patient not taking: Reported on 03/03/2019) 1 Inhaler 2   amLODipine (NORVASC) 5 MG tablet TK 1 T PO Q NIGHT     aspirin 81 MG tablet Take 81 mg  by mouth daily.     cyclobenzaprine (FLEXERIL) 10 MG tablet TAKE 1 TABLET BY MOUTH THREE TIMES A DAY AS NEEDED FOR MUSCLE SPASMS 30 tablet 0   DULoxetine (CYMBALTA) 30 MG capsule 1 once daily for 1 week then increase to 1 twice daily 60 capsule 3   folic acid (FOLVITE) 1 MG tablet TAKE 1 TABLET(1 MG) BY MOUTH DAILY 30 tablet 4   gabapentin (NEURONTIN) 300 MG capsule Take 1 capsule (300 mg total) by mouth 3 (three) times daily. 90 capsule 3   meloxicam (MOBIC) 15 MG tablet TK 1 T PO QD WF     oxyCODONE-acetaminophen (PERCOCET/ROXICET) 5-325 MG tablet Take 1 tablet by mouth every 8 (eight) hours as needed for severe pain. 30 tablet 0   prochlorperazine (COMPAZINE) 10 MG tablet TAKE 1 TABLET(10 MG) BY MOUTH EVERY 6 HOURS AS NEEDED FOR NAUSEA OR VOMITING 30 tablet 0   No current facility-administered medications for this visit.     SURGICAL HISTORY: No past surgical history on file.  REVIEW OF SYSTEMS:  A comprehensive review of systems was negative except for: Constitutional: positive for fatigue Musculoskeletal: positive for Right shoulder pain   PHYSICAL EXAMINATION: General appearance: alert, cooperative, fatigued and no distress Head: Normocephalic, without obvious abnormality, atraumatic Neck: no adenopathy, no JVD, supple, symmetrical, trachea midline and thyroid not enlarged, symmetric, no tenderness/mass/nodules Lymph nodes: Cervical, supraclavicular, and axillary nodes normal. Resp: clear to auscultation bilaterally Back: symmetric, no curvature. ROM normal. No CVA tenderness. Cardio:  regular rate and rhythm, S1, S2 normal, no murmur, click, rub or gallop GI: soft, non-tender; bowel sounds normal; no masses,  no organomegaly Extremities: extremities normal, atraumatic, no cyanosis or edema  ECOG PERFORMANCE STATUS: 1 - Symptomatic but completely ambulatory  Blood pressure (!) 138/99, pulse (!) 110, temperature 98.3 F (36.8 C), temperature source Oral, resp. rate 18, height  5\' 7"  (1.702 m), weight 245 lb 12.8 oz (111.5 kg), SpO2 100 %.  LABORATORY DATA: Lab Results  Component Value Date   WBC 9.1 08/05/2019   HGB 12.1 (L) 08/05/2019   HCT 37.2 (L) 08/05/2019   MCV 100.5 (H) 08/05/2019   PLT 278 08/05/2019      Chemistry      Component Value Date/Time   NA 135 07/15/2019 0911   K 3.6 07/15/2019 0911   CL 98 07/15/2019 0911   CO2 26 07/15/2019 0911   BUN 13 07/15/2019 0911   CREATININE 0.84 07/15/2019 0911      Component Value Date/Time   CALCIUM 9.9 07/15/2019 0911   ALKPHOS 125 07/15/2019 0911   AST 28 07/15/2019 0911   ALT 28 07/15/2019 0911   BILITOT 0.4 07/15/2019 0911       RADIOGRAPHIC STUDIES: Ct Chest W Contrast  Result Date: 07/12/2019 CLINICAL DATA:  Lung cancer, ongoing chemotherapy and immunotherapy. EXAM: CT CHEST, ABDOMEN, AND PELVIS WITH CONTRAST TECHNIQUE: Multidetector CT imaging of the chest, abdomen and pelvis was performed following the standard protocol during bolus administration of intravenous contrast. CONTRAST:  162mL OMNIPAQUE IOHEXOL 300 MG/ML  SOLN COMPARISON:  CT chest 05/11/2019 and CT abdomen 03/31/2019. FINDINGS: CT CHEST FINDINGS Cardiovascular: Atherosclerotic calcification of the aorta, aortic valve and coronary arteries. Heart is mildly enlarged. No pericardial effusion. Mediastinum/Nodes: No pathologically enlarged mediastinal, hilar or axillary lymph nodes. Esophagus is grossly unremarkable. Lungs/Pleura: New and enlarging left upper lobe nodules. An 11 mm (10 x 12 mm, series 6, image 51) nodule in the left upper lobe is new. Nodules in the posterior left upper lobe have decreased slightly in size, measuring 9 mm (51), previously 11 mm. Post treatment architectural distortion and volume loss in the surrounding left upper lobe. Ground-glass nodule in the posterior segment right upper lobe measures 10 mm (51), similar. No pleural fluid. Airway is unremarkable. Musculoskeletal: Vague rounded sclerosis is seen in the  central aspect of the T10 vertebral body, as before. CT ABDOMEN PELVIS FINDINGS Hepatobiliary: Millimetric low-attenuation lesion in the right hepatic lobe is unchanged. Liver and gallbladder are otherwise unremarkable. No biliary ductal dilatation. Pancreas: 1.4 cm heterogeneous low-attenuation lesion in the pancreatic tail, similar, with new distal atrophy (series 2, images 57-58). No ductal dilatation. Remainder the pancreas is unremarkable. Spleen: Negative. Adrenals/Urinary Tract: Adrenal glands and kidneys are unremarkable. Ureters are decompressed. Bladder is grossly unremarkable. Stomach/Bowel: Stomach, small bowel, appendix and colon are unremarkable. Vascular/Lymphatic: Atherosclerotic calcification of the aorta. Abdominal peritoneal ligament, periportal and retroperitoneal lymph nodes are not enlarged by CT size criteria. Reproductive: Prostate is visualized. Other: No free fluid.  Mesenteries and peritoneum are unremarkable. Musculoskeletal: There may be a sclerotic synovial herniation pit in the proximal right femur. Probable mixed lytic and sclerotic metastasis with superior endplate compression involving the posterior aspect of L5, partially imaged previously. Rounded lucency is seen in the lateral aspect of the right femoral neck (series 4, image 86), indeterminate. IMPRESSION: 1. Overall, slight disease progression, as evidenced by increasing nodularity in the left upper lobe. 2. Pancreatic tail lesion is similar but with new distal atrophy. Metastatic  disease is favored over primary malignancy. 3. Osseous metastatic disease, similar were imaged previously. 4. 10 mm ground-glass nodule in the posterior segment right upper lobe, similar. Malignancy is not excluded. 5. Aortic atherosclerosis (ICD10-170.0). Coronary artery calcification. Electronically Signed   By: Lorin Picket M.D.   On: 07/12/2019 09:23   Ct Abdomen Pelvis W Contrast  Result Date: 07/12/2019 CLINICAL DATA:  Lung cancer,  ongoing chemotherapy and immunotherapy. EXAM: CT CHEST, ABDOMEN, AND PELVIS WITH CONTRAST TECHNIQUE: Multidetector CT imaging of the chest, abdomen and pelvis was performed following the standard protocol during bolus administration of intravenous contrast. CONTRAST:  164mL OMNIPAQUE IOHEXOL 300 MG/ML  SOLN COMPARISON:  CT chest 05/11/2019 and CT abdomen 03/31/2019. FINDINGS: CT CHEST FINDINGS Cardiovascular: Atherosclerotic calcification of the aorta, aortic valve and coronary arteries. Heart is mildly enlarged. No pericardial effusion. Mediastinum/Nodes: No pathologically enlarged mediastinal, hilar or axillary lymph nodes. Esophagus is grossly unremarkable. Lungs/Pleura: New and enlarging left upper lobe nodules. An 11 mm (10 x 12 mm, series 6, image 51) nodule in the left upper lobe is new. Nodules in the posterior left upper lobe have decreased slightly in size, measuring 9 mm (51), previously 11 mm. Post treatment architectural distortion and volume loss in the surrounding left upper lobe. Ground-glass nodule in the posterior segment right upper lobe measures 10 mm (51), similar. No pleural fluid. Airway is unremarkable. Musculoskeletal: Vague rounded sclerosis is seen in the central aspect of the T10 vertebral body, as before. CT ABDOMEN PELVIS FINDINGS Hepatobiliary: Millimetric low-attenuation lesion in the right hepatic lobe is unchanged. Liver and gallbladder are otherwise unremarkable. No biliary ductal dilatation. Pancreas: 1.4 cm heterogeneous low-attenuation lesion in the pancreatic tail, similar, with new distal atrophy (series 2, images 57-58). No ductal dilatation. Remainder the pancreas is unremarkable. Spleen: Negative. Adrenals/Urinary Tract: Adrenal glands and kidneys are unremarkable. Ureters are decompressed. Bladder is grossly unremarkable. Stomach/Bowel: Stomach, small bowel, appendix and colon are unremarkable. Vascular/Lymphatic: Atherosclerotic calcification of the aorta. Abdominal  peritoneal ligament, periportal and retroperitoneal lymph nodes are not enlarged by CT size criteria. Reproductive: Prostate is visualized. Other: No free fluid.  Mesenteries and peritoneum are unremarkable. Musculoskeletal: There may be a sclerotic synovial herniation pit in the proximal right femur. Probable mixed lytic and sclerotic metastasis with superior endplate compression involving the posterior aspect of L5, partially imaged previously. Rounded lucency is seen in the lateral aspect of the right femoral neck (series 4, image 86), indeterminate. IMPRESSION: 1. Overall, slight disease progression, as evidenced by increasing nodularity in the left upper lobe. 2. Pancreatic tail lesion is similar but with new distal atrophy. Metastatic disease is favored over primary malignancy. 3. Osseous metastatic disease, similar were imaged previously. 4. 10 mm ground-glass nodule in the posterior segment right upper lobe, similar. Malignancy is not excluded. 5. Aortic atherosclerosis (ICD10-170.0). Coronary artery calcification. Electronically Signed   By: Lorin Picket M.D.   On: 07/12/2019 09:23    ASSESSMENT AND PLAN: This is a very pleasant 58 years old African-American male with likely stage IV non-small cell lung cancer, adenocarcinoma presented with large left upper lobe lung mass in addition to mediastinal and left supraclavicular lymphadenopathy as well as suspicious bone metastasis and bilateral pulmonary nodules diagnosed in March 2020. The patient is currently undergoing systemic chemotherapy with carboplatin, Alimta and Keytruda status post 7 cycles.  Starting from cycle #5 he is receiving maintenance treatment with Alimta and Keytruda. The patient has been tolerating this treatment well. I recommended for him to proceed with cycle #8  today as planned. For the persistent right shoulder pain, I did order MRI of the right shoulder for evaluation of his persistent pain but this was declined by his  insurance.  We will reach out to his insurance company to see why the MRI of the right shoulder was denied. He will continue with his current pain medication for now. I will see the patient back for follow-up visit in 3 weeks for evaluation before starting cycle #9. The patient was advised to call immediately if he has any concerning symptoms in the interval. The patient voices understanding of current disease status and treatment options and is in agreement with the current care plan.  All questions were answered. The patient knows to call the clinic with any problems, questions or concerns. We can certainly see the patient much sooner if necessary.   Disclaimer: This note was dictated with voice recognition software. Similar sounding words can inadvertently be transcribed and may not be corrected upon review.

## 2019-08-05 NOTE — Progress Notes (Signed)
Verbal order from Dr. Julien Nordmann; okay to treat patient with elevated HR of 929 and Diastolic BP of 95.

## 2019-08-05 NOTE — Telephone Encounter (Signed)
Refills requested for cymbalta ([facility]) and Flexeril [facility])

## 2019-08-16 ENCOUNTER — Other Ambulatory Visit: Payer: Self-pay | Admitting: Physician Assistant

## 2019-08-16 ENCOUNTER — Telehealth: Payer: Self-pay | Admitting: Medical Oncology

## 2019-08-16 DIAGNOSIS — C3492 Malignant neoplasm of unspecified part of left bronchus or lung: Secondary | ICD-10-CM

## 2019-08-16 MED ORDER — OXYCODONE-ACETAMINOPHEN 5-325 MG PO TABS: 1.0000 | ORAL_TABLET | Freq: Three times a day (TID) | ORAL | 0 refills | Status: DC | PRN

## 2019-08-16 NOTE — Telephone Encounter (Signed)
Requests refill pain med oxycodone.

## 2019-08-24 ENCOUNTER — Telehealth: Payer: Self-pay | Admitting: Medical Oncology

## 2019-08-24 NOTE — Telephone Encounter (Signed)
Dtr concerned about pts voice.  I called pt and he reports  neck swollen-'about the same " but  worsening hoarseness and a  little trouble swallowing. No choking, coughing  or sob when eating,  " I am okay". His pain is controlled with percocet.   Instructed to keep appt Thursday and call back if he needs [place] sooner.

## 2019-08-26 ENCOUNTER — Other Ambulatory Visit: Payer: Self-pay | Admitting: Medical Oncology

## 2019-08-26 ENCOUNTER — Inpatient Hospital Stay: Payer: Medicaid Other | Attending: Internal Medicine

## 2019-08-26 ENCOUNTER — Encounter: Payer: Self-pay | Admitting: Internal Medicine

## 2019-08-26 ENCOUNTER — Other Ambulatory Visit: Payer: Self-pay

## 2019-08-26 ENCOUNTER — Inpatient Hospital Stay (HOSPITAL_BASED_OUTPATIENT_CLINIC_OR_DEPARTMENT_OTHER): Payer: Medicaid Other | Admitting: Medical

## 2019-08-26 ENCOUNTER — Telehealth: Payer: Self-pay | Admitting: Medical Oncology

## 2019-08-26 ENCOUNTER — Inpatient Hospital Stay (HOSPITAL_BASED_OUTPATIENT_CLINIC_OR_DEPARTMENT_OTHER): Payer: Medicaid Other | Admitting: Internal Medicine

## 2019-08-26 VITALS — BP 125/95

## 2019-08-26 DIAGNOSIS — C3412 Malignant neoplasm of upper lobe, left bronchus or lung: Secondary | ICD-10-CM | POA: Diagnosis present

## 2019-08-26 DIAGNOSIS — M25511 Pain in right shoulder: Secondary | ICD-10-CM | POA: Insufficient documentation

## 2019-08-26 DIAGNOSIS — Z5111 Encounter for antineoplastic chemotherapy: Secondary | ICD-10-CM | POA: Insufficient documentation

## 2019-08-26 DIAGNOSIS — Z5112 Encounter for antineoplastic immunotherapy: Secondary | ICD-10-CM | POA: Insufficient documentation

## 2019-08-26 DIAGNOSIS — C7951 Secondary malignant neoplasm of bone: Secondary | ICD-10-CM | POA: Diagnosis not present

## 2019-08-26 DIAGNOSIS — Z23 Encounter for immunization: Secondary | ICD-10-CM | POA: Diagnosis not present

## 2019-08-26 DIAGNOSIS — C3490 Malignant neoplasm of unspecified part of unspecified bronchus or lung: Secondary | ICD-10-CM

## 2019-08-26 DIAGNOSIS — C3492 Malignant neoplasm of unspecified part of left bronchus or lung: Secondary | ICD-10-CM

## 2019-08-26 DIAGNOSIS — I10 Essential (primary) hypertension: Secondary | ICD-10-CM | POA: Diagnosis not present

## 2019-08-26 DIAGNOSIS — Z79899 Other long term (current) drug therapy: Secondary | ICD-10-CM | POA: Insufficient documentation

## 2019-08-26 LAB — CMP (CANCER CENTER ONLY)
ALT: 27 U/L (ref 0–44)
AST: 27 U/L (ref 15–41)
Albumin: 4.4 g/dL (ref 3.5–5.0)
Alkaline Phosphatase: 118 U/L (ref 38–126)
Anion gap: 10 (ref 5–15)
BUN: 10 mg/dL (ref 6–20)
CO2: 26 mmol/L (ref 22–32)
Calcium: 9.7 mg/dL (ref 8.9–10.3)
Chloride: 98 mmol/L (ref 98–111)
Creatinine: 0.8 mg/dL (ref 0.61–1.24)
GFR, Est AFR Am: >60 mL/min (ref >60)
GFR, Estimated: >60 mL/min (ref >60)
Glucose, Bld: 95 mg/dL (ref 70–99)
Potassium: 3.7 mmol/L (ref 3.5–5.1)
Sodium: 134 mmol/L — ABNORMAL LOW (ref 135–145)
Total Bilirubin: 0.5 mg/dL (ref 0.3–1.2)
Total Protein: 8.8 g/dL — ABNORMAL HIGH (ref 6.5–8.1)

## 2019-08-26 LAB — CBC WITH DIFFERENTIAL (CANCER CENTER ONLY)
Abs Immature Granulocytes: 0.03 10*3/uL (ref 0.00–0.07)
Basophils Absolute: 0 10*3/uL (ref 0.0–0.1)
Basophils Relative: 0 %
Eosinophils Absolute: 0.3 10*3/uL (ref 0.0–0.5)
Eosinophils Relative: 3 %
HCT: 39.6 % (ref 39.0–52.0)
Hemoglobin: 13 g/dL (ref 13.0–17.0)
Immature Granulocytes: 0 %
Lymphocytes Relative: 35 %
Lymphs Abs: 3.2 10*3/uL (ref 0.7–4.0)
MCH: 32.7 pg (ref 26.0–34.0)
MCHC: 32.8 g/dL (ref 30.0–36.0)
MCV: 99.5 fL (ref 80.0–100.0)
Monocytes Absolute: 1.1 10*3/uL — ABNORMAL HIGH (ref 0.1–1.0)
Monocytes Relative: 12 %
Neutro Abs: 4.4 10*3/uL (ref 1.7–7.7)
Neutrophils Relative %: 50 %
Platelet Count: 307 10*3/uL (ref 150–400)
RBC: 3.98 MIL/uL — ABNORMAL LOW (ref 4.22–5.81)
RDW: 13.3 % (ref 11.5–15.5)
WBC Count: 8.9 10*3/uL (ref 4.0–10.5)
nRBC: 0 % (ref 0.0–0.2)

## 2019-08-26 LAB — TSH: TSH: 0.87 u[IU]/mL (ref 0.320–4.118)

## 2019-08-26 MED ORDER — PROCHLORPERAZINE MALEATE 10 MG PO TABS
10.0000 mg | ORAL_TABLET | Freq: Once | ORAL | Status: AC
  Administered 2019-08-26: 10 mg via ORAL

## 2019-08-26 MED ORDER — SODIUM CHLORIDE 0.9 % IV SOLN
500.0000 mg/m2 | Freq: Once | INTRAVENOUS | Status: AC
  Administered 2019-08-26: 14:00:00 1100 mg via INTRAVENOUS
  Filled 2019-08-26: qty 4

## 2019-08-26 MED ORDER — PROCHLORPERAZINE MALEATE 10 MG PO TABS
ORAL_TABLET | ORAL | Status: AC
  Filled 2019-08-26: qty 1

## 2019-08-26 MED ORDER — SODIUM CHLORIDE 0.9 % IV SOLN
Freq: Once | INTRAVENOUS | Status: AC
  Administered 2019-08-26: 12:00:00 via INTRAVENOUS
  Filled 2019-08-26: qty 250

## 2019-08-26 MED ORDER — SODIUM CHLORIDE 0.9 % IV SOLN
200.0000 mg | Freq: Once | INTRAVENOUS | Status: AC
  Administered 2019-08-26: 200 mg via INTRAVENOUS
  Filled 2019-08-26: qty 8

## 2019-08-26 NOTE — Progress Notes (Signed)
[facility] Telephone:([phone]   Fax:(336) [phone]  OFFICE PROGRESS NOTE  Willie Schmidt, [address] [place] [address]  DIAGNOSIS: Stage IV (T2b, N3, M1 C) non-small cell lung cancer, adenocarcinoma presented with left upper lobe lung mass in addition to mediastinal and left supraclavicular lymphadenopathy as well as bilateral pulmonary nodules and suspicious bone lesion diagnosed in March 2020.  PRIOR THERAPY: None  CURRENT THERAPY: Systemic chemotherapy with carboplatin for AUC of 5, Alimta 500 mg/M2 and Keytruda 200 mg IV every 3 weeks.  First dose March 11, 2019.  Status post 8 cycles.  Starting from cycle #5 the patient is on maintenance treatment with Alimta and Keytruda every 3 weeks.  INTERVAL HISTORY: Willie Schmidt 58 y.o. male returns to the clinic today for follow-up visit.  His daughter Willie Schmidt was available by phone during the visit.  The patient is feeling fine today with no concerning complaints except for the persistent right shoulder pain.  I ordered MRI of the right shoulder for evaluation of his pain but unfortunately it was declined by his insurance.  The patient denied having any current chest pain, shortness of breath, cough or hemoptysis.  He denied having any recent weight loss or night sweats.  He has no nausea, vomiting, diarrhea or constipation.  He is here today for evaluation before starting cycle #9 of his treatment.   MEDICAL HISTORY: Past Medical History:  Diagnosis Date  . Bronchitis, chronic ([place])   . Hypertension   . metastatic lung ca dx'd 02/2019   lyphadenopathy, bil pulmonary noduels; suspected bone lesions    ALLERGIES:  has No Known Allergies.  MEDICATIONS:  Current Outpatient Medications  Medication Sig Dispense Refill  . albuterol (PROVENTIL HFA;VENTOLIN HFA) 108 (90 BASE) MCG/ACT inhaler Inhale 2 puffs into the lungs every 6 (six) hours as needed for wheezing. (Patient not taking: Reported on 03/03/2019)  1 Inhaler 2  . amLODipine (NORVASC) 5 MG tablet TK 1 T PO Q NIGHT    . aspirin 81 MG tablet Take 81 mg by mouth daily.    . cyclobenzaprine (FLEXERIL) 10 MG tablet TAKE 1 TABLET BY MOUTH THREE TIMES A DAY AS NEEDED FOR MUSCLE SPASMS 30 tablet 0  . DULoxetine (CYMBALTA) 30 MG capsule 1 once daily for 1 week then increase to 1 twice daily 60 capsule 3  . folic acid (FOLVITE) 1 MG tablet TAKE 1 TABLET(1 MG) BY MOUTH DAILY 30 tablet 4  . gabapentin (NEURONTIN) 300 MG capsule Take 1 capsule (300 mg total) by mouth 3 (three) times daily. 90 capsule 3  . meloxicam (MOBIC) 15 MG tablet TK 1 T PO QD WF    . oxyCODONE-acetaminophen (PERCOCET/ROXICET) 5-325 MG tablet Take 1 tablet by mouth every 8 (eight) hours as needed for severe pain. 30 tablet 0  . prochlorperazine (COMPAZINE) 10 MG tablet TAKE 1 TABLET(10 MG) BY MOUTH EVERY 6 HOURS AS NEEDED FOR NAUSEA OR VOMITING 30 tablet 0   No current facility-administered medications for this visit.     SURGICAL HISTORY: No past surgical history on file.  REVIEW OF SYSTEMS:  A comprehensive review of systems was negative except for: Constitutional: positive for fatigue Musculoskeletal: positive for Right shoulder pain   PHYSICAL EXAMINATION: General appearance: alert, cooperative, fatigued and no distress Head: Normocephalic, without obvious abnormality, atraumatic Neck: no adenopathy, no JVD, supple, symmetrical, trachea midline and thyroid not enlarged, symmetric, no tenderness/mass/nodules Lymph nodes: Cervical, supraclavicular, and axillary nodes  normal. Resp: clear to auscultation bilaterally Back: symmetric, no curvature. ROM normal. No CVA tenderness. Cardio: regular rate and rhythm, S1, S2 normal, no murmur, click, rub or gallop GI: soft, non-tender; bowel sounds normal; no masses,  no organomegaly Extremities: extremities normal, atraumatic, no cyanosis or edema  ECOG PERFORMANCE STATUS: 1 - Symptomatic but completely ambulatory  Blood pressure  (!) 137/109, pulse 100, temperature 98 F (36.7 C), temperature source Oral, resp. rate 18, weight 248 lb 0.2 oz (112.5 kg), SpO2 100 %.  LABORATORY DATA: Lab Results  Component Value Date   WBC 8.9 08/26/2019   HGB 13.0 08/26/2019   HCT 39.6 08/26/2019   MCV 99.5 08/26/2019   PLT 307 08/26/2019      Chemistry      Component Value Date/Time   NA 135 08/05/2019 0928   K 3.6 08/05/2019 0928   CL 98 08/05/2019 0928   CO2 25 08/05/2019 0928   BUN 9 08/05/2019 0928   CREATININE 0.84 08/05/2019 0928      Component Value Date/Time   CALCIUM 9.7 08/05/2019 0928   ALKPHOS 112 08/05/2019 0928   AST 30 08/05/2019 0928   ALT 30 08/05/2019 0928   BILITOT 0.4 08/05/2019 0928       RADIOGRAPHIC STUDIES: No results found.  ASSESSMENT AND PLAN: This is a very pleasant 58 years old African-American male with likely stage IV non-small cell lung cancer, adenocarcinoma presented with large left upper lobe lung mass in addition to mediastinal and left supraclavicular lymphadenopathy as well as suspicious bone metastasis and bilateral pulmonary nodules diagnosed in March 2020. The patient is currently undergoing systemic chemotherapy with carboplatin, Alimta and Keytruda status post 8 cycles.  Starting from cycle #5 he is receiving maintenance treatment with Alimta and Keytruda. He has been tolerating the treatment well. I recommended for him to proceed with cycle #9 today as planned. I will see him back for follow-up visit in 3 weeks for evaluation with repeat CT scan of the chest, abdomen and pelvis for restaging of his disease. For the persistent right shoulder pain, I will contact his insurance company to see is a reason for the denial and try to convince him to proceed with the MRI of the shoulder to identify the etiology behind his persistent right shoulder pain.  He will continue on his current pain medication. The patient was advised to call immediately if he has any concerning symptoms in  the interval. The patient voices understanding of current disease status and treatment options and is in agreement with the current care plan.  All questions were answered. The patient knows to call the clinic with any problems, questions or concerns. We can certainly see the patient much sooner if necessary.   Disclaimer: This note was dictated with voice recognition software. Similar sounding words can inadvertently be transcribed and may not be corrected upon review.

## 2019-08-26 NOTE — Patient Instructions (Signed)
[facility] Discharge Instructions for Patients Receiving Chemotherapy  Today you received the following chemotherapy agents Keytruda; Alimta  To help prevent nausea and vomiting after your treatment, we encourage you to take your nausea medication as directed   If you develop nausea and vomiting that is not controlled by your nausea medication, call the clinic.   BELOW ARE SYMPTOMS THAT SHOULD BE REPORTED IMMEDIATELY:  *FEVER GREATER THAN 100.5 F  *CHILLS WITH OR WITHOUT FEVER  NAUSEA AND VOMITING THAT IS NOT CONTROLLED WITH YOUR NAUSEA MEDICATION  *UNUSUAL SHORTNESS OF BREATH  *UNUSUAL BRUISING OR BLEEDING  TENDERNESS IN MOUTH AND THROAT WITH OR WITHOUT PRESENCE OF ULCERS  *URINARY PROBLEMS  *BOWEL PROBLEMS  UNUSUAL RASH Items with * indicate a potential emergency and should be followed up as soon as possible.  Feel free to call the clinic should you have any questions or concerns. The clinic phone number is [phone].  Please show the CHEMO ALERT CARD at check-in to the Emergency Department and triage nurse.   

## 2019-08-26 NOTE — Telephone Encounter (Signed)
Faxed appeals form requesting peer to peer .

## 2019-08-27 ENCOUNTER — Telehealth: Payer: Self-pay | Admitting: Internal Medicine

## 2019-08-27 ENCOUNTER — Telehealth: Payer: Self-pay | Admitting: *Deleted

## 2019-08-27 NOTE — Telephone Encounter (Signed)
Scheduled appt per 9/3 los - added additional appts to whats already scheduled. Pt to get an updated schedule next visit.

## 2019-08-27 NOTE — Telephone Encounter (Signed)
Willie Schmidt from [place] states daughter called requesting Palliative Care. OK for them to see patient.

## 2019-08-31 ENCOUNTER — Telehealth: Payer: Self-pay | Admitting: Internal Medicine

## 2019-08-31 NOTE — Telephone Encounter (Signed)
Spoke with daughter [name] Cameron regarding Palliative services and she was in agreement with this.  I have scheduled a Telephone Palliative Consult for 09/02/19 @ 9 AM.

## 2019-09-01 ENCOUNTER — Telehealth: Payer: Self-pay | Admitting: *Deleted

## 2019-09-01 NOTE — Progress Notes (Signed)
These preliminary result these preliminary results were noted.  Awaiting final report.

## 2019-09-01 NOTE — Telephone Encounter (Signed)
Willie Schmidt from 3rd party agency called on pt's behalf in regards to a denial letter from [facility] on an Upper Extremity MRI. Willie Schmidt asked to set up a conference call with MD and Medicaid rep to discuss the appeal/dennial of MRI. She will also need pt's permission.  Willie Ngo MD available 9/12.  Willie Schmidt advised she did not have pt's phone # or birthday, address provided was not same as in pt chart.  Informed her I would call pt and ask pt to call her as I am not able to give out patient information. S/w pt discussed above information. Pt unable to take down Willie Schmidt's # and advised he would call back at a later time.

## 2019-09-02 ENCOUNTER — Other Ambulatory Visit: Payer: Medicaid Other | Admitting: Internal Medicine

## 2019-09-02 ENCOUNTER — Other Ambulatory Visit: Payer: Self-pay

## 2019-09-02 DIAGNOSIS — Z515 Encounter for palliative care: Secondary | ICD-10-CM

## 2019-09-03 ENCOUNTER — Ambulatory Visit (HOSPITAL_COMMUNITY)
Admission: RE | Admit: 2019-09-03 | Discharge: 2019-09-03 | Disposition: A | Discharge status: Home / Self Care | Payer: Medicaid Other | Source: Ambulatory Visit | Attending: Physician Assistant | Admitting: Physician Assistant

## 2019-09-03 ENCOUNTER — Other Ambulatory Visit: Payer: Self-pay

## 2019-09-03 ENCOUNTER — Other Ambulatory Visit: Payer: Self-pay | Admitting: Internal Medicine

## 2019-09-03 ENCOUNTER — Other Ambulatory Visit: Payer: Self-pay | Admitting: Medical Oncology

## 2019-09-03 ENCOUNTER — Telehealth: Payer: Self-pay | Admitting: Medical Oncology

## 2019-09-03 DIAGNOSIS — C3492 Malignant neoplasm of unspecified part of left bronchus or lung: Secondary | ICD-10-CM

## 2019-09-03 DIAGNOSIS — M542 Cervicalgia: Secondary | ICD-10-CM

## 2019-09-03 NOTE — Telephone Encounter (Signed)
Dr Julien Nordmann given information to call for [employer] and peer to peer.

## 2019-09-03 NOTE — Progress Notes (Signed)
Per Dr Julien Nordmann he ordered DG shoulder xray.

## 2019-09-03 NOTE — Telephone Encounter (Signed)
[name] notified that pt needs to come to radiology today for xray of shoulder. Order placed.

## 2019-09-09 ENCOUNTER — Other Ambulatory Visit: Payer: Self-pay

## 2019-09-09 ENCOUNTER — Encounter (HOSPITAL_COMMUNITY)
Admission: RE | Admit: 2019-09-09 | Discharge: 2019-09-09 | Disposition: A | Discharge status: Home / Self Care | Payer: Medicaid Other | Source: Ambulatory Visit | Attending: Internal Medicine | Admitting: Internal Medicine

## 2019-09-09 ENCOUNTER — Other Ambulatory Visit: Payer: Self-pay | Admitting: Internal Medicine

## 2019-09-09 DIAGNOSIS — M542 Cervicalgia: Secondary | ICD-10-CM | POA: Insufficient documentation

## 2019-09-09 DIAGNOSIS — G8929 Other chronic pain: Secondary | ICD-10-CM

## 2019-09-09 MED ORDER — TECHNETIUM TC 99M MEDRONATE IV KIT
22.0000 | PACK | Freq: Once | INTRAVENOUS | Status: AC | PRN
  Administered 2019-09-09: 22 via INTRAVENOUS

## 2019-09-13 ENCOUNTER — Other Ambulatory Visit: Payer: Self-pay | Admitting: Internal Medicine

## 2019-09-13 ENCOUNTER — Telehealth: Payer: Self-pay | Admitting: *Deleted

## 2019-09-13 DIAGNOSIS — C3492 Malignant neoplasm of unspecified part of left bronchus or lung: Secondary | ICD-10-CM

## 2019-09-13 MED ORDER — OXYCODONE-ACETAMINOPHEN 5-325 MG PO TABS: 1.0000 | ORAL_TABLET | Freq: Three times a day (TID) | ORAL | 0 refills | Status: DC | PRN

## 2019-09-13 NOTE — Telephone Encounter (Signed)
Pt called with request for Percocet and Cymbalta to be refilled and sent to [facility].  Advised pt has refills on Cymbalta, to call his pharmacy and let them know. Message forward to MD for review refill request for percocet.

## 2019-09-14 ENCOUNTER — Ambulatory Visit (HOSPITAL_COMMUNITY): Admission: RE | Admit: 2019-09-14 | Payer: Medicaid Other | Source: Ambulatory Visit

## 2019-09-15 ENCOUNTER — Telehealth: Payer: Self-pay | Admitting: *Deleted

## 2019-09-15 ENCOUNTER — Other Ambulatory Visit: Payer: Self-pay | Admitting: *Deleted

## 2019-09-15 DIAGNOSIS — C3492 Malignant neoplasm of unspecified part of left bronchus or lung: Secondary | ICD-10-CM

## 2019-09-15 NOTE — Progress Notes (Signed)
    [facility] Consult Note Telephone: [phone]  Fax: [phone]  PATIENT NAME: Willie Schmidt DOB: October 09, 1961 MRN: [id number]  PRIMARY CARE PROVIDER:   [name], MD  REFERRING PROVIDER:  Dr. Earlie Server  RESPONSIBLE PARTY:   Self and [name] Cameron(daughter)/POA     RECOMMENDATIONS and PLAN:   Palliative Care Encounter Z51.5  1. Advance care planning:  Introduction telephone conversation with daughter [name] Cameron.  Patient was not available for conversation today.  Explanation of  Palliative and Hospice care services to daughter/POA.  Reviewed that goals of care and advanced directives are also normally discussed with patient as well.  She expressed concerns of how information is explained to patient related to intermittent anxiety and ability to understand same information.  Plans for telephone communication with patient to allow patient to discuss his concerns related to current diagnoses and treatment plans. Palliative care will follow patient related to his needs and symptom management.  Daughter agreed with me calling patient for further discussion.   I spent 30 minutes providing this consultation,  from 0900 to 0930.  More than 50% of the time in this consultation was spent coordinating communication.   HISTORY OF PRESENT ILLNESS:  Willie Schmidt is a 58 y.o. year old male with medical problems including metastatic lung cancer and is currently receiving treatment for same with Oncology.  He lives at home and is independent with performing his ADLs. Palliative Care was asked to help address goals of care.   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 Bronchitis, chronic ([place])   . Hypertension   . metastatic lung ca dx'd 02/2019   lyphadenopathy, bil  pulmonary noduels; suspected bone lesions    SOCIAL HX: Daily ETOH use  ALLERGIES: No Known Allergies   PERTINENT MEDICATIONS:  Outpatient Encounter Medications as of 09/02/2019  Medication Sig  . albuterol (PROVENTIL HFA;VENTOLIN HFA) 108 (90 BASE) MCG/ACT inhaler Inhale 2 puffs into the lungs every 6 (six) hours as needed for wheezing. (Patient not taking: Reported on 03/03/2019)  . amLODipine (NORVASC) 5 MG tablet TK 1 T PO Q NIGHT  . aspirin 81 MG tablet Take 81 mg by mouth daily.  . DULoxetine (CYMBALTA) 30 MG capsule 1 once daily for 1 week then increase to 1 twice daily  . folic acid (FOLVITE) 1 MG tablet TAKE 1 TABLET(1 MG) BY MOUTH DAILY  . gabapentin (NEURONTIN) 300 MG capsule Take 1 capsule (300 mg total) by mouth 3 (three) times daily.  . meloxicam (MOBIC) 15 MG tablet TK 1 T PO QD WF  . prochlorperazine (COMPAZINE) 10 MG tablet TAKE 1 TABLET(10 MG) BY MOUTH EVERY 6 HOURS AS NEEDED FOR NAUSEA OR VOMITING  . [DISCONTINUED] cyclobenzaprine (FLEXERIL) 10 MG tablet TAKE 1 TABLET BY MOUTH THREE TIMES A DAY AS NEEDED FOR MUSCLE SPASMS  . [DISCONTINUED] oxyCODONE-acetaminophen (PERCOCET/ROXICET) 5-325 MG tablet Take 1 tablet by mouth every 8 (eight) hours as needed for severe pain.   No facility-administered encounter medications on file as of 09/02/2019.     PHYSICAL EXAM:   Patient was not present for physical examination.  Gonzella Lex, NP-C

## 2019-09-15 NOTE — Telephone Encounter (Signed)
Call to [name] ( daughter ) pt conference in on call- advised CT scan approved appt 9/29 at 0915. Pt to drink contrast beginning at 0715. Pt and daughter verbalized understanding.

## 2019-09-16 ENCOUNTER — Inpatient Hospital Stay: Payer: Medicaid Other

## 2019-09-16 ENCOUNTER — Inpatient Hospital Stay (HOSPITAL_BASED_OUTPATIENT_CLINIC_OR_DEPARTMENT_OTHER): Payer: Medicaid Other | Admitting: Internal Medicine

## 2019-09-16 ENCOUNTER — Encounter: Payer: Self-pay | Admitting: Internal Medicine

## 2019-09-16 ENCOUNTER — Other Ambulatory Visit: Payer: Self-pay

## 2019-09-16 VITALS — BP 143/99

## 2019-09-16 VITALS — BP 145/105 | HR 98 | Temp 98.5°F | Resp 19 | Ht 67.0 in | Wt 252.2 lb

## 2019-09-16 DIAGNOSIS — Z5111 Encounter for antineoplastic chemotherapy: Secondary | ICD-10-CM

## 2019-09-16 DIAGNOSIS — G8929 Other chronic pain: Secondary | ICD-10-CM

## 2019-09-16 DIAGNOSIS — M25511 Pain in right shoulder: Secondary | ICD-10-CM

## 2019-09-16 DIAGNOSIS — C3492 Malignant neoplasm of unspecified part of left bronchus or lung: Secondary | ICD-10-CM

## 2019-09-16 DIAGNOSIS — Z23 Encounter for immunization: Secondary | ICD-10-CM

## 2019-09-16 DIAGNOSIS — Z5112 Encounter for antineoplastic immunotherapy: Secondary | ICD-10-CM

## 2019-09-16 LAB — CBC WITH DIFFERENTIAL (CANCER CENTER ONLY)
Abs Immature Granulocytes: 0.02 10*3/uL (ref 0.00–0.07)
Basophils Absolute: 0 10*3/uL (ref 0.0–0.1)
Basophils Relative: 0 %
Eosinophils Absolute: 0.3 10*3/uL (ref 0.0–0.5)
Eosinophils Relative: 4 %
HCT: 37.9 % — ABNORMAL LOW (ref 39.0–52.0)
Hemoglobin: 12.1 g/dL — ABNORMAL LOW (ref 13.0–17.0)
Immature Granulocytes: 0 %
Lymphocytes Relative: 32 %
Lymphs Abs: 3 10*3/uL (ref 0.7–4.0)
MCH: 32.1 pg (ref 26.0–34.0)
MCHC: 31.9 g/dL (ref 30.0–36.0)
MCV: 100.5 fL — ABNORMAL HIGH (ref 80.0–100.0)
Monocytes Absolute: 1.1 10*3/uL — ABNORMAL HIGH (ref 0.1–1.0)
Monocytes Relative: 12 %
Neutro Abs: 4.8 10*3/uL (ref 1.7–7.7)
Neutrophils Relative %: 52 %
Platelet Count: 310 10*3/uL (ref 150–400)
RBC: 3.77 MIL/uL — ABNORMAL LOW (ref 4.22–5.81)
RDW: 13.3 % (ref 11.5–15.5)
WBC Count: 9.4 10*3/uL (ref 4.0–10.5)
nRBC: 0 % (ref 0.0–0.2)

## 2019-09-16 LAB — CMP (CANCER CENTER ONLY)
ALT: 27 U/L (ref 0–44)
AST: 29 U/L (ref 15–41)
Albumin: 4.4 g/dL (ref 3.5–5.0)
Alkaline Phosphatase: 121 U/L (ref 38–126)
Anion gap: 10 (ref 5–15)
BUN: 8 mg/dL (ref 6–20)
CO2: 27 mmol/L (ref 22–32)
Calcium: 9.7 mg/dL (ref 8.9–10.3)
Chloride: 97 mmol/L — ABNORMAL LOW (ref 98–111)
Creatinine: 0.79 mg/dL (ref 0.61–1.24)
GFR, Est AFR Am: >60 mL/min (ref >60)
GFR, Estimated: >60 mL/min (ref >60)
Glucose, Bld: 99 mg/dL (ref 70–99)
Potassium: 3.8 mmol/L (ref 3.5–5.1)
Sodium: 134 mmol/L — ABNORMAL LOW (ref 135–145)
Total Bilirubin: 0.5 mg/dL (ref 0.3–1.2)
Total Protein: 8.7 g/dL — ABNORMAL HIGH (ref 6.5–8.1)

## 2019-09-16 LAB — TSH: TSH: 1.13 u[IU]/mL (ref 0.320–4.118)

## 2019-09-16 MED ORDER — SODIUM CHLORIDE 0.9 % IV SOLN
Freq: Once | INTRAVENOUS | Status: AC
  Administered 2019-09-16: 12:00:00 via INTRAVENOUS
  Filled 2019-09-16: qty 250

## 2019-09-16 MED ORDER — SODIUM CHLORIDE 0.9 % IV SOLN
200.0000 mg | Freq: Once | INTRAVENOUS | Status: AC
  Administered 2019-09-16: 200 mg via INTRAVENOUS
  Filled 2019-09-16: qty 8

## 2019-09-16 MED ORDER — INFLUENZA VAC SPLIT QUAD 0.5 ML IM SUSY
PREFILLED_SYRINGE | INTRAMUSCULAR | Status: AC
  Filled 2019-09-16: qty 0.5

## 2019-09-16 MED ORDER — HEPARIN SOD (PORK) LOCK FLUSH 100 UNIT/ML IV SOLN
500.0000 [IU] | Freq: Once | INTRAVENOUS | Status: DC | PRN
  Filled 2019-09-16: qty 5

## 2019-09-16 MED ORDER — PROCHLORPERAZINE MALEATE 10 MG PO TABS
ORAL_TABLET | ORAL | Status: AC
  Filled 2019-09-16: qty 1

## 2019-09-16 MED ORDER — INFLUENZA VAC SPLIT QUAD 0.5 ML IM SUSY
0.5000 mL | PREFILLED_SYRINGE | Freq: Once | INTRAMUSCULAR | Status: AC
  Administered 2019-09-16: 0.5 mL via INTRAMUSCULAR

## 2019-09-16 MED ORDER — PROCHLORPERAZINE MALEATE 10 MG PO TABS
10.0000 mg | ORAL_TABLET | Freq: Once | ORAL | Status: AC
  Administered 2019-09-16: 10 mg via ORAL

## 2019-09-16 MED ORDER — SODIUM CHLORIDE 0.9 % IV SOLN
500.0000 mg/m2 | Freq: Once | INTRAVENOUS | Status: AC
  Administered 2019-09-16: 1100 mg via INTRAVENOUS
  Filled 2019-09-16: qty 40

## 2019-09-16 MED ORDER — SODIUM CHLORIDE 0.9% FLUSH
10.0000 mL | INTRAVENOUS | Status: DC | PRN
  Filled 2019-09-16: qty 10

## 2019-09-16 NOTE — Patient Instructions (Signed)
[facility] Discharge Instructions for Patients Receiving Chemotherapy  Today you received the following chemotherapy agents: Keytruda, Alimta   To help prevent nausea and vomiting after your treatment, we encourage you to take your nausea medication as directed.    If you develop nausea and vomiting that is not controlled by your nausea medication, call the clinic.   BELOW ARE SYMPTOMS THAT SHOULD BE REPORTED IMMEDIATELY:  *FEVER GREATER THAN 100.5 F  *CHILLS WITH OR WITHOUT FEVER  NAUSEA AND VOMITING THAT IS NOT CONTROLLED WITH YOUR NAUSEA MEDICATION  *UNUSUAL SHORTNESS OF BREATH  *UNUSUAL BRUISING OR BLEEDING  TENDERNESS IN MOUTH AND THROAT WITH OR WITHOUT PRESENCE OF ULCERS  *URINARY PROBLEMS  *BOWEL PROBLEMS  UNUSUAL RASH Items with * indicate a potential emergency and should be followed up as soon as possible.  Feel free to call the clinic should you have any questions or concerns. The clinic phone number is [phone].  Please show the [facility] at check-in to the Emergency Department and triage nurse.

## 2019-09-16 NOTE — Addendum Note (Signed)
Addended by: Lucile Crater on: 09/16/2019 11:36 AM   Modules accepted: Orders

## 2019-09-16 NOTE — Progress Notes (Signed)
[facility] [phone]   [phone]  OFFICE PROGRESS NOTE  [name], [address] [place] [address]  DIAGNOSIS: Stage IV (T2b, N3, M1 C) non-small cell lung cancer, adenocarcinoma presented with left upper lobe lung mass in addition to mediastinal and left supraclavicular lymphadenopathy as well as bilateral pulmonary nodules and suspicious bone lesion diagnosed in March 2020.  PRIOR THERAPY: None  CURRENT THERAPY: Systemic chemotherapy with carboplatin for AUC of 5, Alimta 500 mg/M2 and Keytruda 200 mg IV every 3 weeks.  First dose March 11, 2019.  Status post 9 cycles.  Starting from cycle #5 the patient is on maintenance treatment with Alimta and Keytruda every 3 weeks.  INTERVAL HISTORY: Willie Schmidt 58 y.o. male returns to the clinic today for follow-up visit.  The patient is feeling fine except for fatigue as well as the pain in the right neck shoulder area and radiation to the right arm.  He has this problem for several weeks.  His insurance declined the approval of the MRI of the shoulder and less the patient has x-ray of the shoulder as well as bone scan which were performed last week and did not show any explanation for his persistent pain.  The patient denied having any current chest pain, shortness of breath, cough or hemoptysis.  He denied having any fever or chills.  He has no nausea, vomiting, diarrhea or constipation.  He was supposed to have repeat CT scan of the chest, abdomen pelvis for restaging of his disease after cycle #9 but again this was declined by his insurance and scheduled to be done next week.  He is here today for evaluation before starting cycle #10.   MEDICAL HISTORY: Past Medical History:  Diagnosis Date  . Bronchitis, chronic ([place])   . Hypertension   . metastatic lung ca dx'd 02/2019   lyphadenopathy, bil pulmonary noduels; suspected bone lesions    ALLERGIES:  has No Known Allergies.   MEDICATIONS:  Current Outpatient Medications  Medication Sig Dispense Refill  . albuterol (PROVENTIL HFA;VENTOLIN HFA) 108 (90 BASE) MCG/ACT inhaler Inhale 2 puffs into the lungs every 6 (six) hours as needed for wheezing. (Patient not taking: Reported on 03/03/2019) 1 Inhaler 2  . amLODipine (NORVASC) 5 MG tablet TK 1 T PO Q NIGHT    . aspirin 81 MG tablet Take 81 mg by mouth daily.    . cyclobenzaprine (FLEXERIL) 10 MG tablet TAKE 1 TABLET BY MOUTH THREE TIMES DAILY AS NEEDED FOR MUSCLE SPASMS 30 tablet 0  . DULoxetine (CYMBALTA) 30 MG capsule 1 once daily for 1 week then increase to 1 twice daily 60 capsule 3  . folic acid (FOLVITE) 1 MG tablet TAKE 1 TABLET(1 MG) BY MOUTH DAILY 30 tablet 4  . gabapentin (NEURONTIN) 300 MG capsule Take 1 capsule (300 mg total) by mouth 3 (three) times daily. 90 capsule 3  . meloxicam (MOBIC) 15 MG tablet TK 1 T PO QD WF    . oxyCODONE-acetaminophen (PERCOCET/ROXICET) 5-325 MG tablet Take 1 tablet by mouth every 8 (eight) hours as needed for severe pain. 30 tablet 0  . prochlorperazine (COMPAZINE) 10 MG tablet TAKE 1 TABLET(10 MG) BY MOUTH EVERY 6 HOURS AS NEEDED FOR NAUSEA OR VOMITING 30 tablet 0   No current facility-administered medications for this visit.     SURGICAL HISTORY: No past surgical history on file.  REVIEW OF SYSTEMS:  A comprehensive review of systems  was negative except for: Constitutional: positive for fatigue Musculoskeletal: positive for Right shoulder pain   PHYSICAL EXAMINATION: General appearance: alert, cooperative, fatigued and no distress Head: Normocephalic, without obvious abnormality, atraumatic Neck: no adenopathy, no JVD, supple, symmetrical, trachea midline and thyroid not enlarged, symmetric, no tenderness/mass/nodules Lymph nodes: Cervical, supraclavicular, and axillary nodes normal. Resp: clear to auscultation bilaterally Back: symmetric, no curvature. ROM normal. No CVA tenderness. Cardio: regular rate and rhythm,  S1, S2 normal, no murmur, click, rub or gallop GI: soft, non-tender; bowel sounds normal; no masses,  no organomegaly Extremities: extremities normal, atraumatic, no cyanosis or edema  ECOG PERFORMANCE STATUS: 1 - Symptomatic but completely ambulatory  Blood pressure (!) 145/105, pulse 98, temperature 98.5 F (36.9 C), temperature source Temporal, resp. rate 19, height 5\' 7"  (1.702 m), weight 252 lb 3.2 oz (114.4 kg), SpO2 100 %.  LABORATORY DATA: Lab Results  Component Value Date   WBC 9.4 09/16/2019   HGB 12.1 (L) 09/16/2019   HCT 37.9 (L) 09/16/2019   MCV 100.5 (H) 09/16/2019   PLT 310 09/16/2019      Chemistry      Component Value Date/Time   NA 134 (L) 09/16/2019 0949   K 3.8 09/16/2019 0949   CL 97 (L) 09/16/2019 0949   CO2 27 09/16/2019 0949   BUN 8 09/16/2019 0949   CREATININE 0.79 09/16/2019 0949      Component Value Date/Time   CALCIUM 9.7 09/16/2019 0949   ALKPHOS 121 09/16/2019 0949   AST 29 09/16/2019 0949   ALT 27 09/16/2019 0949   BILITOT 0.5 09/16/2019 0949       RADIOGRAPHIC STUDIES: Dg Shoulder Right  Result Date: 09/04/2019 CLINICAL DATA:  Right upper lobe cancer neck pain shoulder pain EXAM: RIGHT SHOULDER - 2+ VIEW COMPARISON:  None. FINDINGS: There is no evidence of fracture or dislocation. There is no evidence of arthropathy or other focal bone abnormality. Soft tissues are unremarkable. IMPRESSION: Negative. Electronically Signed   By: Donavan Foil M.D.   On: 09/04/2019 19:24   Nm Bone Scan Whole Body  Result Date: 09/09/2019 CLINICAL DATA:  Neck pain.  History of lung cancer. EXAM: NUCLEAR MEDICINE WHOLE BODY BONE SCAN TECHNIQUE: Whole body anterior and posterior images were obtained approximately 3 hours after intravenous injection of radiopharmaceutical. RADIOPHARMACEUTICALS:  22.0 mCi Technetium-60m MDP IV COMPARISON:  CT scan of July 12, 2019. FINDINGS: Focus of abnormal uptake is noted in left foot most consistent with degenerative change.  Abnormal uptake is seen involving the posterior portion of L5 consistent with metastatic disease as described on prior CT scan. Focus of abnormal uptake is seen on the right side of the cervical spine posteriorly at approximately the C3 or C4 level. IMPRESSION: Abnormal uptake is seen involving posterior portion of L5 vertebral body consistent with metastatic disease as described on prior CT scan. There is also noted at focus of abnormal uptake involving the right side of the cervical spine posteriorly at approximately the C3 or C4 level, which may represent metastatic disease or potentially degenerative change. Further evaluation with radiographs or MRI is recommended. Electronically Signed   By: Marijo Conception M.D.   On: 09/09/2019 13:22    ASSESSMENT AND PLAN: This is a very pleasant 58 years old African-American male with likely stage IV non-small cell lung cancer, adenocarcinoma presented with large left upper lobe lung mass in addition to mediastinal and left supraclavicular lymphadenopathy as well as suspicious bone metastasis and bilateral pulmonary nodules diagnosed in March  2020. The patient is currently undergoing systemic chemotherapy with carboplatin, Alimta and Keytruda status post 9 cycles.  Starting from cycle #5 he is receiving maintenance treatment with Alimta and Keytruda. He has been tolerating this treatment well with no concerning adverse effects. I recommended for him to proceed with cycle #10 today but I will try to get the restaging scan done before his next visit. For the persistent right shoulder pain, x-ray of the shoulder as well as bone scan were unremarkable for any explanation of the persistent pain in the right shoulder.  I would request MRI of the shoulder again and hopefully this will be approved by his insurance this time. For hypertension, he was advised to take his blood pressure medication and monitor it closely at home. I will see the patient back for follow-up visit  in 3 weeks for evaluation before the next cycle of his treatment. The patient was advised to call immediately if he has any other concerning symptoms in the interval. The patient voices understanding of current disease status and treatment options and is in agreement with the current care plan.  All questions were answered. The patient knows to call the clinic with any problems, questions or concerns. We can certainly see the patient much sooner if necessary.   Disclaimer: This note was dictated with voice recognition software. Similar sounding words can inadvertently be transcribed and may not be corrected upon review.

## 2019-09-17 ENCOUNTER — Other Ambulatory Visit: Payer: Self-pay | Admitting: Internal Medicine

## 2019-09-17 DIAGNOSIS — C3492 Malignant neoplasm of unspecified part of left bronchus or lung: Secondary | ICD-10-CM

## 2019-09-21 ENCOUNTER — Encounter (HOSPITAL_COMMUNITY): Payer: Self-pay | Admitting: Student

## 2019-09-21 ENCOUNTER — Other Ambulatory Visit: Payer: Self-pay

## 2019-09-21 ENCOUNTER — Emergency Department (HOSPITAL_COMMUNITY): Payer: Medicaid Other

## 2019-09-21 ENCOUNTER — Ambulatory Visit (HOSPITAL_COMMUNITY)
Admission: RE | Admit: 2019-09-21 | Discharge: 2019-09-21 | Disposition: A | Discharge status: Home / Self Care | Payer: Medicaid Other | Source: Ambulatory Visit | Attending: Internal Medicine | Admitting: Internal Medicine

## 2019-09-21 ENCOUNTER — Emergency Department (HOSPITAL_COMMUNITY)
Admission: EM | Admit: 2019-09-21 | Discharge: 2019-09-21 | Disposition: A | Discharge status: Home / Self Care | Payer: Medicaid Other | Attending: Emergency Medicine | Admitting: Emergency Medicine

## 2019-09-21 ENCOUNTER — Emergency Department (HOSPITAL_BASED_OUTPATIENT_CLINIC_OR_DEPARTMENT_OTHER): Payer: Medicaid Other

## 2019-09-21 ENCOUNTER — Telehealth: Payer: Self-pay | Admitting: Medical Oncology

## 2019-09-21 DIAGNOSIS — Z79899 Other long term (current) drug therapy: Secondary | ICD-10-CM | POA: Diagnosis not present

## 2019-09-21 DIAGNOSIS — M7989 Other specified soft tissue disorders: Secondary | ICD-10-CM

## 2019-09-21 DIAGNOSIS — I10 Essential (primary) hypertension: Secondary | ICD-10-CM | POA: Diagnosis not present

## 2019-09-21 DIAGNOSIS — Z7982 Long term (current) use of aspirin: Secondary | ICD-10-CM | POA: Diagnosis not present

## 2019-09-21 DIAGNOSIS — M79604 Pain in right leg: Secondary | ICD-10-CM | POA: Insufficient documentation

## 2019-09-21 DIAGNOSIS — C3490 Malignant neoplasm of unspecified part of unspecified bronchus or lung: Secondary | ICD-10-CM | POA: Diagnosis not present

## 2019-09-21 DIAGNOSIS — C3492 Malignant neoplasm of unspecified part of left bronchus or lung: Secondary | ICD-10-CM | POA: Diagnosis present

## 2019-09-21 DIAGNOSIS — Z87891 Personal history of nicotine dependence: Secondary | ICD-10-CM | POA: Diagnosis not present

## 2019-09-21 DIAGNOSIS — M79609 Pain in unspecified limb: Secondary | ICD-10-CM

## 2019-09-21 LAB — COMPREHENSIVE METABOLIC PANEL
ALT: 40 U/L (ref 0–44)
AST: 34 U/L (ref 15–41)
Albumin: 4.3 g/dL (ref 3.5–5.0)
Alkaline Phosphatase: 111 U/L (ref 38–126)
Anion gap: 16 — ABNORMAL HIGH (ref 5–15)
BUN: 10 mg/dL (ref 6–20)
CO2: 23 mmol/L (ref 22–32)
Calcium: 9.4 mg/dL (ref 8.9–10.3)
Chloride: 93 mmol/L — ABNORMAL LOW (ref 98–111)
Creatinine, Ser: 0.67 mg/dL (ref 0.61–1.24)
GFR calc Af Amer: >60 mL/min (ref >60)
GFR calc non Af Amer: >60 mL/min (ref >60)
Glucose, Bld: 101 mg/dL — ABNORMAL HIGH (ref 70–99)
Potassium: 3.7 mmol/L (ref 3.5–5.1)
Sodium: 132 mmol/L — ABNORMAL LOW (ref 135–145)
Total Bilirubin: 1 mg/dL (ref 0.3–1.2)
Total Protein: 8.6 g/dL — ABNORMAL HIGH (ref 6.5–8.1)

## 2019-09-21 LAB — CBC WITH DIFFERENTIAL/PLATELET
Abs Immature Granulocytes: 0.05 10*3/uL (ref 0.00–0.07)
Basophils Absolute: 0 10*3/uL (ref 0.0–0.1)
Basophils Relative: 0 %
Eosinophils Absolute: 0.2 10*3/uL (ref 0.0–0.5)
Eosinophils Relative: 2 %
HCT: 35.7 % — ABNORMAL LOW (ref 39.0–52.0)
Hemoglobin: 11.3 g/dL — ABNORMAL LOW (ref 13.0–17.0)
Immature Granulocytes: 1 %
Lymphocytes Relative: 23 %
Lymphs Abs: 2.6 10*3/uL (ref 0.7–4.0)
MCH: 32.2 pg (ref 26.0–34.0)
MCHC: 31.7 g/dL (ref 30.0–36.0)
MCV: 101.7 fL — ABNORMAL HIGH (ref 80.0–100.0)
Monocytes Absolute: 0.4 10*3/uL (ref 0.1–1.0)
Monocytes Relative: 4 %
Neutro Abs: 7.8 10*3/uL — ABNORMAL HIGH (ref 1.7–7.7)
Neutrophils Relative %: 70 %
Platelets: 232 10*3/uL (ref 150–400)
RBC: 3.51 MIL/uL — ABNORMAL LOW (ref 4.22–5.81)
RDW: 13.2 % (ref 11.5–15.5)
WBC: 11 10*3/uL — ABNORMAL HIGH (ref 4.0–10.5)
nRBC: 0 % (ref 0.0–0.2)

## 2019-09-21 MED ORDER — IOHEXOL 300 MG/ML  SOLN
100.0000 mL | Freq: Once | INTRAMUSCULAR | Status: AC | PRN
  Administered 2019-09-21: 10:00:00 100 mL via INTRAVENOUS

## 2019-09-21 MED ORDER — CLINDAMYCIN HCL 150 MG PO CAPS: 450.0000 mg | ORAL_CAPSULE | Freq: Three times a day (TID) | ORAL | 0 refills | Status: DC

## 2019-09-21 MED ORDER — SODIUM CHLORIDE 0.9 % IV BOLUS
500.0000 mL | Freq: Once | INTRAVENOUS | Status: AC
  Administered 2019-09-21: 500 mL via INTRAVENOUS

## 2019-09-21 MED ORDER — ONDANSETRON HCL 4 MG/2ML IJ SOLN
4.0000 mg | Freq: Once | INTRAMUSCULAR | Status: AC
  Administered 2019-09-21: 4 mg via INTRAVENOUS
  Filled 2019-09-21: qty 2

## 2019-09-21 MED ORDER — HYDROMORPHONE HCL 1 MG/ML IJ SOLN
1.0000 mg | Freq: Once | INTRAMUSCULAR | Status: AC
  Administered 2019-09-21: 13:00:00 1 mg via INTRAVENOUS
  Filled 2019-09-21: qty 1

## 2019-09-21 MED ORDER — SODIUM CHLORIDE (PF) 0.9 % IJ SOLN
INTRAMUSCULAR | Status: AC
  Filled 2019-09-21: qty 50

## 2019-09-21 MED ORDER — MORPHINE SULFATE (PF) 4 MG/ML IV SOLN
4.0000 mg | Freq: Once | INTRAVENOUS | Status: AC
  Administered 2019-09-21: 4 mg via INTRAVENOUS
  Filled 2019-09-21: qty 1

## 2019-09-21 NOTE — Progress Notes (Signed)
Right lower extremity venous duplex completed. Refer to "CV Proc" under chart review to view preliminary results.  09/21/2019 12:41 PM Maudry Mayhew, MHA, RVT, RDCS, RDMS

## 2019-09-21 NOTE — ED Provider Notes (Signed)
Medical screening examination/treatment/procedure(s) were conducted as a shared visit with non-physician practitioner(s) and myself.  I personally evaluated the patient during the encounter. Briefly, the patient is a 58 y.o. male with history of lung cancer who presents the ED with right leg pain and swelling.  Patient mild tachycardia but otherwise normal vitals.  Pain and swelling to the right lower leg for the last several days.  Has some asymmetric swelling compared to the left leg.  Good pulses in both legs.  Has some warmth to the right leg with 1+ pitting edema.  No edema to the left lower extremity.  DVT study was negative.  Mild leukocytosis but otherwise unremarkable lab work.  Swelling is localized mostly to the right shin.  Could be some venous stasis versus cellulitis.  Will treat with antibiotics and have him follow-up closely with primary care doctor.  Chest x-ray showed no signs of obvious fluid or volume overload.  Does not appear to be consistent with volume overload at this time.  Given return precautions and discharged in ED in good condition.  This chart was dictated using voice recognition software.  Despite best efforts to proofread,  errors can occur which can change the documentation meaning.     EKG Interpretation None           Lennice Sites, DO 09/21/19 1243

## 2019-09-21 NOTE — ED Triage Notes (Signed)
Pt states that he has stage IV lung CA and noticed on Sunday that his R leg was swelling and painful. Difficult to walk on. States that he has SOB at baseline that has worsened. Alert and oriented. Taking active chemo. Last tx on 9/24.

## 2019-09-21 NOTE — ED Notes (Signed)
US at bedside

## 2019-09-21 NOTE — Telephone Encounter (Addendum)
Per NP- Pt went to ED today for LLL pain and difficulty walking -"crying in pain". DVT ruled out. Pt prescribed Clindamycin for cellulitis.   Reqests walker due to difficulty walking and Lower left leg pain . Order sent to [facility] for signature.- LVM for [name] with this information and to check [place] for a rash ? Shingles.

## 2019-09-21 NOTE — Discharge Instructions (Addendum)
You were seen in the emergency department today for right leg pain.  Your ultrasound to not show a blood clot in your leg.  Your chest x-ray looks similar to prior imaging you have had done of your chest.  Your labs show that your white blood cell count was a bit elevated which can happen with infection.  Also showed that you are a bit anemic which will need to be rechecked by your primary care provider or your oncologist.  We are covering you for an infection of the leg known as cellulitis with clindamycin, an antibiotic, please take this as prescribed.  Please take all of your antibiotics until finished. You may develop abdominal discomfort or diarrhea from the antibiotic.  You may help offset this with probiotics which you can buy at the store (ask your pharmacist if unable to find) or get probiotics in the form of eating yogurt. Do not eat or take the probiotics until 2 hours after your antibiotic. If you are unable to tolerate these side effects follow-up with your primary care provider or return to the emergency department.   If you begin to experience any blistering, rashes, swelling, or difficulty breathing seek medical care for evaluation of potentially more serious side effects.   Please be aware that this medication may interact with other medications you are taking, please be sure to discuss your medication list with your pharmacist. If you are taking birth control the antibiotic will deactivate your birth control for 2 weeks. If on coumadin the antibiotic will effect your coumadin level.   Please follow-up with your primary care within 3 days, return to the emergency department immediately for new or worsening symptoms including but not limited to increased swelling/redness especially up the leg, increased work of breathing, chest pain, numbness/tingling of the leg, leg becoming pale/blue or cold to the touch, or any other concerns.

## 2019-09-21 NOTE — ED Notes (Signed)
ED Provider at bedside. 

## 2019-09-21 NOTE — ED Notes (Signed)
Pt able to ambulate to wheelchair to be discharged.

## 2019-09-21 NOTE — ED Provider Notes (Signed)
[place] DEPT Provider Note   CSN: [id number] Arrival date & time: 09/21/19  1044     History   Chief Complaint Chief Complaint  Patient presents with   Leg Pain    HPI Willie Schmidt is a 58 y.o. 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 Pain is a 10/10 in severity, worse with movement/weightbearing, alleviated some w/ elevation. Tried his at home percocet without much change. Reports he has baseline dyspnea w/ his lung cancer but this has seemed a bit worse with a dry cough over the past few days. Denies chest pain, hemoptysis, recent surgery/trauma, recent long travel, hormone use, or hx of DVT/PE. Denies injury to the RLE, change in activity, numbness, tingling, or weakness. Denies fever or chills.    Last oncology office visit: 09/16/19  Current oncologic therapy:  Systemic chemotherapy with carboplatin for AUC of 5, Alimta 500 mg/M2 and Keytruda 200 mg IV every 3 weeks.  First dose March 11, 2019.  Status post 9 cycles.  Starting from cycle #5 the patient is on maintenance treatment with Alimta and Keytruda every 3 weeks.--> last chemo was last Thursday.     HPI  Past Medical History:  Diagnosis Date   Bronchitis, chronic ([place])    Hypertension    metastatic lung ca dx'd 02/2019   lyphadenopathy, bil pulmonary noduels; suspected bone lesions    Patient Active Problem List   Diagnosis Date Noted   Right shoulder pain 04/22/2019   Adenocarcinoma of left lung, stage 4 ([place]) 03/03/2019   Encounter for antineoplastic chemotherapy 03/03/2019   Encounter for antineoplastic immunotherapy 03/03/2019   Goals of care, counseling/discussion 03/03/2019   Neck pain on right side 03/03/2019   Neck pain 02/17/2019    No past surgical history on file.      Home Medications     Prior to Admission medications   Medication Sig Start Date End Date Taking? Authorizing Provider  albuterol (PROVENTIL HFA;VENTOLIN HFA) 108 (90 BASE) MCG/ACT inhaler Inhale 2 puffs into the lungs every 6 (six) hours as needed for wheezing. Patient not taking: Reported on 03/03/2019 02/10/13   Hyman Bible, PA-C  amLODipine (NORVASC) 5 MG tablet TK 1 T PO Q NIGHT 06/25/19   [provider]  aspirin 81 MG tablet Take 81 mg by mouth daily.    [provider]  cyclobenzaprine (FLEXERIL) 10 MG tablet TAKE 1 TABLET BY MOUTH THREE TIMES DAILY AS NEEDED FOR MUSCLE SPASMS 09/06/19   Curt Bears, MD  DULoxetine (CYMBALTA) 30 MG capsule 1 once daily for 1 week then increase to 1 twice daily 08/05/19   Curt Bears, MD  folic acid (FOLVITE) 1 MG tablet TAKE 1 TABLET(1 MG) BY MOUTH DAILY 08/04/19   Curt Bears, MD  gabapentin (NEURONTIN) 300 MG capsule Take 1 capsule (300 mg total) by mouth 3 (three) times daily. 03/12/19   Tanner, Lyndon Code., PA-C  meloxicam (MOBIC) 15 MG tablet TK 1 T PO QD WF 06/01/19   [provider]  oxyCODONE-acetaminophen (PERCOCET/ROXICET) 5-325 MG tablet Take 1 tablet by mouth every 8 (eight) hours as needed for severe pain. 09/13/19   Curt Bears, MD  prochlorperazine (COMPAZINE) 10 MG tablet TAKE 1 TABLET(10 MG) BY MOUTH EVERY 6 HOURS AS NEEDED FOR NAUSEA OR VOMITING 06/28/19   Curt Bears, MD  Family History No family history on file.  Social History Social History   Tobacco Use   Smoking status: Former Smoker    Types: Cigarettes   Smokeless tobacco: Never Used  Substance Use Topics   Alcohol use: Yes   Drug use: No     Allergies   Patient has no known allergies.   Review of Systems Review of Systems  Constitutional: Negative for chills and fever.  HENT: Negative for congestion, ear pain and sore throat.   Respiratory: Positive for cough and shortness of breath.   Cardiovascular: Positive for leg swelling.  Negative for chest pain.  Gastrointestinal: Negative for abdominal pain and vomiting.  Musculoskeletal: Positive for myalgias.  Neurological: Negative for weakness and numbness.  All other systems reviewed and are negative.    Physical Exam Updated Vital Signs BP (!) 148/109 (BP Location: Right Arm)    Pulse (!) 112    Temp 98.3 F (36.8 C) (Oral)    Resp 18    Ht 5\' 10"  (1.778 m)    Wt 112 kg    SpO2 98%    BMI 35.44 kg/m   Physical Exam Vitals signs and nursing note reviewed.  Constitutional:      General: He is not in acute distress.    Appearance: He is well-developed. He is not ill-appearing or toxic-appearing.  HENT:     Head: Normocephalic and atraumatic.  Eyes:     General:        Right eye: No discharge.        Left eye: No discharge.     Conjunctiva/sclera: Conjunctivae normal.  Neck:     Musculoskeletal: Neck supple.  Cardiovascular:     Rate and Rhythm: Regular rhythm. Tachycardia present.     Pulses:          Dorsalis pedis pulses are 2+ on the right side and 2+ on the left side.       Posterior tibial pulses are 2+ on the right side and 2+ on the left side.  Pulmonary:     Effort: Pulmonary effort is normal. No respiratory distress.     Breath sounds: Normal breath sounds. No wheezing, rhonchi or rales.  Abdominal:     General: There is no distension.     Palpations: Abdomen is soft.     Tenderness: There is no abdominal tenderness. There is no guarding or rebound.  Musculoskeletal:     Comments: Lower extremities:RLE: there is 1-2+ pitting edema to the right lower leg which does not extend past the knee. Overlying erythema/warmth.  Patient has intact AROM to bilateral hips, knees, ankles, and all digits. Tender to palpation to the R calf. NVI distally. Compartments are soft.   Skin:    General: Skin is warm and dry.     Capillary Refill: Capillary refill takes less than 2 seconds.     Findings: No rash.  Neurological:     Mental Status: He is alert.      Comments: Alert. Clear speech. Sensation grossly intact to bilateral lower extremities. 5/5 strength with plantar/dorsiflexion bilaterally.   Psychiatric:        Mood and Affect: Mood normal.        Behavior: Behavior normal.    ED Treatments / Results  Labs (all labs ordered are listed, but only abnormal results are displayed) Labs Reviewed  CBC WITH DIFFERENTIAL/PLATELET - Abnormal; Notable for the following components:      Result Value   WBC 11.0 (*)  RBC 3.51 (*)    Hemoglobin 11.3 (*)    HCT 35.7 (*)    MCV 101.7 (*)    Neutro Abs 7.8 (*)    All other components within normal limits  COMPREHENSIVE METABOLIC PANEL - Abnormal; Notable for the following components:   Sodium 132 (*)    Chloride 93 (*)    Glucose, Bld 101 (*)    Total Protein 8.6 (*)    Anion gap 16 (*)    All other components within normal limits    EKG None  Radiology Ct Chest W Contrast  Result Date: 09/21/2019 CLINICAL DATA:  Lung cancer restaging EXAM: CT CHEST, ABDOMEN, AND PELVIS WITH CONTRAST TECHNIQUE: Multidetector CT imaging of the chest, abdomen and pelvis was performed following the standard protocol during bolus administration of intravenous contrast. CONTRAST:  127mL OMNIPAQUE IOHEXOL 300 MG/ML SOLN, additional oral enteric contrast COMPARISON:  CT chest abdomen pelvis, 07/12/2019, CT chest, 05/11/2019, CT abdomen pelvis, 03/31/2019, CT chest, 02/14/2019 FINDINGS: CT CHEST FINDINGS Cardiovascular: Aortic atherosclerosis. Normal heart size. Scattered coronary artery calcifications. No pericardial effusion. Mediastinum/Nodes: No change in soft tissue thickening about the left hilum and AP window. No discretely enlarged mediastinal lymph nodes. No change in enlarged left supraclavicular lymph nodes, the largest measuring 1.5 x 0.9 cm (series 2, image 10). Thyroid gland, trachea, and esophagus demonstrate no significant findings. Lungs/Pleura: Redemonstrated trace left pleural effusion. Interval  improvement in left upper lobe nodularity, with near resolution of multiple previously seen nodules, with persistent irregular opacity and architectural distortion throughout the left upper lobe. Largest residual nodule of the lateral left upper lobe measures 7 mm, previously 1.0 cm (series 7, image 55). No change in a subtle 9 mm ground-glass nodule of the posterior right upper lobe (series 7, image 54). Musculoskeletal: No chest wall mass. CT ABDOMEN PELVIS FINDINGS Hepatobiliary: No solid liver abnormality is seen. No gallstones, gallbladder wall thickening, or biliary dilatation. Pancreas: A previously seen hypodense lesion of the pancreatic tail is resolved, with persistent mild parenchymal atrophy of the pancreatic tail (series 2, image 62). No pancreatic ductal dilatation or surrounding inflammatory changes. Spleen: Normal in size without significant abnormality. Adrenals/Urinary Tract: Adrenal glands are unremarkable. New, mild perirenal fat stranding (series 2, image 69). Kidneys are otherwise normal, without renal calculi, solid lesion, or hydronephrosis. Bladder is unremarkable. Stomach/Bowel: Stomach is within normal limits. Appendix appears normal. No evidence of bowel wall thickening, distention, or inflammatory changes. Vascular/Lymphatic: Aortic atherosclerosis. No enlarged abdominal or pelvic lymph nodes. Reproductive: No mass or other abnormality. Other: No abdominal wall hernia or abnormality. No abdominopelvic ascites. Musculoskeletal: Unchanged sclerotic lesion of the T10 vertebral body (series 6, image 107). Unchanged superior endplate deformity with adjacent sclerosis of the right aspect of L5 (series 6, image 100). Unchanged sclerotic lesion of the right femoral neck (series 5, image 107). IMPRESSION: 1. Interval improvement in left upper lobe nodularity, with near resolution of multiple previously seen nodules, with persistent irregular opacity and architectural distortion throughout the  left upper lobe. Largest residual nodule of the lateral left upper lobe measures 7 mm, previously 1.0 cm (series 7, image 55). Findings are generally consistent with treatment response and developing post treatment/post radiation change. 2. No change in soft tissue thickening about the left hilum and AP window. No discretely enlarged mediastinal lymph nodes. No change in enlarged left supraclavicular lymph nodes, the largest measuring 1.5 x 0.9 cm (series 2, image 10). 3. No change in a subtle 9 mm ground-glass nodule of the posterior  right upper lobe (series 7, image 54), nonspecific although favor incidental infection or inflammation. Attention on follow-up. 4. A previously seen hypodense lesion of the pancreatic tail is resolved, with persistent mild parenchymal atrophy of the pancreatic tail (series 2, image 62). Findings again consistent with treatment response. 5. No change in sclerotic osseous metastatic disease of T10 and L5. Lesion of the right femoral neck is likely an incidental enchondroma or other benign lesion. Attention on follow-up. 6. New, mild perirenal fat stranding (series 2, image 69), infectious or inflammatory, differential considerations including drug toxicity. Correlate with urinalysis. 7.  Aortic Atherosclerosis (ICD10-I70.0). Electronically Signed   By: Eddie Candle M.D.   On: 09/21/2019 12:27   Ct Abdomen Pelvis W Contrast  Result Date: 09/21/2019 CLINICAL DATA:  Lung cancer restaging EXAM: CT CHEST, ABDOMEN, AND PELVIS WITH CONTRAST TECHNIQUE: Multidetector CT imaging of the chest, abdomen and pelvis was performed following the standard protocol during bolus administration of intravenous contrast. CONTRAST:  182mL OMNIPAQUE IOHEXOL 300 MG/ML SOLN, additional oral enteric contrast COMPARISON:  CT chest abdomen pelvis, 07/12/2019, CT chest, 05/11/2019, CT abdomen pelvis, 03/31/2019, CT chest, 02/14/2019 FINDINGS: CT CHEST FINDINGS Cardiovascular: Aortic atherosclerosis. Normal heart  size. Scattered coronary artery calcifications. No pericardial effusion. Mediastinum/Nodes: No change in soft tissue thickening about the left hilum and AP window. No discretely enlarged mediastinal lymph nodes. No change in enlarged left supraclavicular lymph nodes, the largest measuring 1.5 x 0.9 cm (series 2, image 10). Thyroid gland, trachea, and esophagus demonstrate no significant findings. Lungs/Pleura: Redemonstrated trace left pleural effusion. Interval improvement in left upper lobe nodularity, with near resolution of multiple previously seen nodules, with persistent irregular opacity and architectural distortion throughout the left upper lobe. Largest residual nodule of the lateral left upper lobe measures 7 mm, previously 1.0 cm (series 7, image 55). No change in a subtle 9 mm ground-glass nodule of the posterior right upper lobe (series 7, image 54). Musculoskeletal: No chest wall mass. CT ABDOMEN PELVIS FINDINGS Hepatobiliary: No solid liver abnormality is seen. No gallstones, gallbladder wall thickening, or biliary dilatation. Pancreas: A previously seen hypodense lesion of the pancreatic tail is resolved, with persistent mild parenchymal atrophy of the pancreatic tail (series 2, image 62). No pancreatic ductal dilatation or surrounding inflammatory changes. Spleen: Normal in size without significant abnormality. Adrenals/Urinary Tract: Adrenal glands are unremarkable. New, mild perirenal fat stranding (series 2, image 69). Kidneys are otherwise normal, without renal calculi, solid lesion, or hydronephrosis. Bladder is unremarkable. Stomach/Bowel: Stomach is within normal limits. Appendix appears normal. No evidence of bowel wall thickening, distention, or inflammatory changes. Vascular/Lymphatic: Aortic atherosclerosis. No enlarged abdominal or pelvic lymph nodes. Reproductive: No mass or other abnormality. Other: No abdominal wall hernia or abnormality. No abdominopelvic ascites. Musculoskeletal:  Unchanged sclerotic lesion of the T10 vertebral body (series 6, image 107). Unchanged superior endplate deformity with adjacent sclerosis of the right aspect of L5 (series 6, image 100). Unchanged sclerotic lesion of the right femoral neck (series 5, image 107). IMPRESSION: 1. Interval improvement in left upper lobe nodularity, with near resolution of multiple previously seen nodules, with persistent irregular opacity and architectural distortion throughout the left upper lobe. Largest residual nodule of the lateral left upper lobe measures 7 mm, previously 1.0 cm (series 7, image 55). Findings are generally consistent with treatment response and developing post treatment/post radiation change. 2. No change in soft tissue thickening about the left hilum and AP window. No discretely enlarged mediastinal lymph nodes. No change in enlarged left supraclavicular lymph  nodes, the largest measuring 1.5 x 0.9 cm (series 2, image 10). 3. No change in a subtle 9 mm ground-glass nodule of the posterior right upper lobe (series 7, image 54), nonspecific although favor incidental infection or inflammation. Attention on follow-up. 4. A previously seen hypodense lesion of the pancreatic tail is resolved, with persistent mild parenchymal atrophy of the pancreatic tail (series 2, image 62). Findings again consistent with treatment response. 5. No change in sclerotic osseous metastatic disease of T10 and L5. Lesion of the right femoral neck is likely an incidental enchondroma or other benign lesion. Attention on follow-up. 6. New, mild perirenal fat stranding (series 2, image 69), infectious or inflammatory, differential considerations including drug toxicity. Correlate with urinalysis. 7.  Aortic Atherosclerosis (ICD10-I70.0). Electronically Signed   By: Eddie Candle M.D.   On: 09/21/2019 12:27   Dg Chest Portable 1 View  Result Date: 09/21/2019 CLINICAL DATA:  Nodular densities and left upper lobe opacity seen on the previous  study have improved. Linear areas extended to the left upper chest, small nodules and other findings in this area better seen on chest CT acquired on 09/21/2019. No signs of dense consolidation. Tiny left effusion not seen on the current exam. Right lung is clear. Cardiomediastinal contours with stable mild cardiac enlargement. EXAM: PORTABLE CHEST 1 VIEW COMPARISON:  Chest CT of the same date and chest CT from July 12, 2019 FINDINGS: The heart size and mediastinal contours are within normal limits. Both lungs are clear. The visualized skeletal structures are unremarkable. IMPRESSION: Improved appearance of the chest based on comparison with prior chest CTs, still with some areas of linear year opacity in smaller nodular densities in the left upper lobe, refer to chest CT acquired the same date. Electronically Signed   By: Zetta Bills M.D.   On: 09/21/2019 12:29   Vas [place] Lower Extremity Venous (dvt) (only Mc & Wl 7a-7p)  Result Date: 09/21/2019  Lower Venous Study Indications: Pain, and Swelling.  Limitations: Pain. Comparison Study: No prior study. Performing Technologist: Maudry Mayhew MHA, RDMS, RVT, RDCS  Examination Guidelines: A complete evaluation includes B-mode imaging, spectral Doppler, color Doppler, and power Doppler as needed of all accessible portions of each vessel. Bilateral testing is considered an integral part of a complete examination. Limited examinations for reoccurring indications may be performed as noted.  +---------+---------------+---------+-----------+----------+--------------+  RIGHT     Compressibility Phasicity Spontaneity Properties Thrombus Aging  +---------+---------------+---------+-----------+----------+--------------+  CFV       Full            Yes       Yes                                    +---------+---------------+---------+-----------+----------+--------------+  SFJ       Full                                                              +---------+---------------+---------+-----------+----------+--------------+  FV Prox   Full                                                             +---------+---------------+---------+-----------+----------+--------------+  FV Mid    Full                                                             +---------+---------------+---------+-----------+----------+--------------+  FV Distal Full                                                             +---------+---------------+---------+-----------+----------+--------------+  PFV       Full                                                             +---------+---------------+---------+-----------+----------+--------------+  POP       Full            Yes       Yes                                    +---------+---------------+---------+-----------+----------+--------------+  PTV       Full                                                             +---------+---------------+---------+-----------+----------+--------------+  PERO      Full                                                             +---------+---------------+---------+-----------+----------+--------------+  Left CFV not visualized.  Summary: Right: There is no evidence of deep vein thrombosis in the lower extremity. No cystic structure found in the popliteal fossa.  *See table(s) above for measurements and observations.    Preliminary     Procedures Procedures (including critical care time)  Medications Ordered in ED Medications - No data to display   Initial Impression / Assessment and Plan / ED Course  I have reviewed the triage vital signs and the nursing notes.  Pertinent labs & imaging results that were available during my care of the patient were reviewed by me and considered in my medical decision making (see chart for details).   Patient w/ stage IV lung cancer receiving chemotherapy presents to the ED w/ complaints of RLE calf pain/swelling with minimal increased chronic  dyspnea over past few days. Patient nontoxic appearing, vitals notable for tachycardia & elevated BP- doubt HTN emergency. RLE is edematous, calf is tender to palpation, NVI distally- primary concern is for DVT, additional DDX cellulitis, venous stasis, HF felt to be less likely with asymmetric swelling, intra-abdominal mass felt to be less likely with only  the calf/shin region being swollen. Not hypoxic or in respiratory distress- will obtain CXR as well.   CBC: Mild leukocytosis with left shift.  Anemia slightly worsened from prior. BMP: Mild electrolyte abnormalities, given 500 cc of normal saline.  Anion gap minimally elevated, no acidosis. Venous duplex of the right lower extremity is negative for DVT, at this time suspect cellulitis versus venous stasis--> will treat with clindamycin per discussion w/ Dr. Ronnald Nian. Chest x-ray without significant change from prior chest imaging, patient again is not in respiratory distress, he is not hypoxic, afebrile, findings not seem consistent with pneumonia, fluid overload, or pneumothorax.  With a negative DVT study and no hypoxia w/ HR improved to 90s on re-assessment feel PE is somewhat less likely.  Will discharge home with clindamycin and close follow-up. I discussed results, treatment plan, need for follow-up, and return precautions with the patient. Provided opportunity for questions, patient confirmed understanding and is in agreement with plan.   This is a shared visit with supervising physician Dr. Ronnald Nian who has independently evaluated patient & provided guidance in evaluation/management/disposition, in agreement with care    Final Clinical Impressions(s) / ED Diagnoses   Final diagnoses:  Pain of right lower extremity    ED Discharge Orders         Ordered    clindamycin (CLEOCIN) 150 MG capsule  3 times daily     09/21/19 1315           Neely Cecena, [place] R, PA-C 09/21/19 1417    Lennice Sites, DO 09/21/19 1442

## 2019-09-24 ENCOUNTER — Other Ambulatory Visit: Payer: Self-pay

## 2019-09-24 ENCOUNTER — Other Ambulatory Visit: Payer: Medicaid Other | Admitting: Internal Medicine

## 2019-09-24 DIAGNOSIS — Z515 Encounter for palliative care: Secondary | ICD-10-CM

## 2019-09-24 DIAGNOSIS — C3492 Malignant neoplasm of unspecified part of left bronchus or lung: Secondary | ICD-10-CM

## 2019-09-27 ENCOUNTER — Telehealth: Payer: Self-pay | Admitting: Physician Assistant

## 2019-09-27 NOTE — Telephone Encounter (Signed)
Spoke to the patient who was requesting results for his shoulder MRI. Just informed him that it showed arthritis and no other concerning findings. We will see him in the clinic on 10/15 for a follow up regarding his care for his lung cancer.

## 2019-09-30 ENCOUNTER — Other Ambulatory Visit: Payer: Self-pay | Admitting: Internal Medicine

## 2019-09-30 ENCOUNTER — Other Ambulatory Visit: Payer: Medicaid Other | Admitting: Internal Medicine

## 2019-09-30 ENCOUNTER — Other Ambulatory Visit: Payer: Self-pay

## 2019-09-30 DIAGNOSIS — Z515 Encounter for palliative care: Secondary | ICD-10-CM

## 2019-09-30 NOTE — Progress Notes (Signed)
[facility] Consult Note Telephone: [phone]  Fax: [phone]  PATIENT NAME: Willie Schmidt DOB: 1961/10/19 MRN: [id number]  PRIMARY CARE PROVIDER:   [name], MD  REFERRING PROVIDER:  [name], MD [address] [place],  [place] [address]  RESPONSIBLE PARTY:   Self and [name] Cameron(daughter)     RECOMMENDATIONS and PLAN:   Palliative Care Encounter  1.  Advance care planning:  Pt goals are to continue chemotherapy treatments and improve pain of R foot/leg.  Advanced directives were reviewed with selections of CPR, full scope of treatment, antibiotics and IV fluids if indicated. MOST completed, signed and left in the home with patient. Questions answered.  Palliative care will f/u with patient on 10/20/19.  2. R leg/foot pain:  Prescribed antibiotics for cellulitis at ER.  Suspected gout.  Add uric acid to next lab draw( authorized by Dr.[name]). Increase hydration, no alcohol, low uric acid diet.  Add tart cherry juice daily. Elevate leg.  Home supply of Percocet 5/325mg  q8  Hrs prn severe pain. Requested wheelchair and walker from oncology practice due to shortness of breath and leg pain.   I spent 60 minutes providing this consultation,  from 1400 to 1500. More than 50% of the time in this consultation was spent coordinating communication with patient, daughter and male friend.   HISTORY OF PRESENT ILLNESS:  Follow-up with Earma Reading. He reports good energy and appetite.  He does have intermittent shortness of breath with exertion.  He also reports difficulty with ambulation and continues to have pain and swelling of hi R leg and foot which is currently being treated for cellulitis with Clindamycin.  No other acute illnesses. Palliative Care was asked to help address goals of care.   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 (Patient not taking: Reported on 03/03/2019)   amLODipine (NORVASC) 5 MG tablet TK 1 T PO Q NIGHT   aspirin 81 MG tablet Take 81 mg by mouth daily.   DULoxetine (CYMBALTA) 30 MG capsule 1 once daily for 1 week then increase to 1 twice daily   folic acid (FOLVITE) 1 MG tablet TAKE 1 TABLET(1 MG) BY MOUTH DAILY   gabapentin (NEURONTIN) 300 MG capsule Take 1 capsule (300 mg total) by mouth 3 (three) times daily.   meloxicam (MOBIC) 15 MG tablet TK 1 T PO QD WF   prochlorperazine (COMPAZINE) 10 MG tablet TAKE 1 TABLET(10 MG) BY MOUTH EVERY 6 HOURS AS NEEDED FOR NAUSEA OR VOMITING   [DISCONTINUED] cyclobenzaprine (FLEXERIL) 10 MG tablet TAKE 1 TABLET BY MOUTH THREE TIMES A DAY AS NEEDED FOR MUSCLE SPASMS   [DISCONTINUED] oxyCODONE-acetaminophen (PERCOCET/ROXICET) 5-325 MG tablet Take 1 tablet by mouth every 8 (eight) hours as needed for severe pain.   No facility-administered encounter medications on file as of 09/02/2019.     PHYSICAL  EXAM:   General: NAD, well nourished male Cardiovascular: regular rate and rhythm Pulmonary: clear throughout.  Congested cough Abdomen: soft, nontender, + bowel sounds Extremities: 2+ edema RLE with point tenderness of the proximal great toe joint.  Limping gait with use of a cane Skin: exposed skin is intact. Warm and dry Neurological: A&O x3  Gonzella Lex, NP-C

## 2019-10-01 ENCOUNTER — Other Ambulatory Visit: Payer: Self-pay | Admitting: *Deleted

## 2019-10-01 ENCOUNTER — Telehealth: Payer: Self-pay | Admitting: *Deleted

## 2019-10-01 DIAGNOSIS — C3492 Malignant neoplasm of unspecified part of left bronchus or lung: Secondary | ICD-10-CM

## 2019-10-01 NOTE — Telephone Encounter (Signed)
NP called regarding pt after Palliative Care visit.  Pt recent ED visit for leg swelling Dx of Cellulitis Rx for antibiotics. Pt still having redness and swelling, NP requesting Uric Acid level at next appt to r/o gout. Pt having difficulty ambulating, wheelchair and walker ordered. NP request to speak with MD who was unavailable, her phone # was taken and given to MD.

## 2019-10-01 NOTE — Telephone Encounter (Signed)
Order entered and faxed to [facility] for wheelchair and walker.

## 2019-10-07 ENCOUNTER — Ambulatory Visit: Payer: Medicaid Other

## 2019-10-07 ENCOUNTER — Inpatient Hospital Stay: Payer: Medicaid Other | Attending: Internal Medicine

## 2019-10-07 ENCOUNTER — Encounter: Payer: Self-pay | Admitting: Physician Assistant

## 2019-10-07 ENCOUNTER — Inpatient Hospital Stay (HOSPITAL_BASED_OUTPATIENT_CLINIC_OR_DEPARTMENT_OTHER): Payer: Medicaid Other | Admitting: Physician Assistant

## 2019-10-07 ENCOUNTER — Inpatient Hospital Stay: Payer: Medicaid Other

## 2019-10-07 ENCOUNTER — Other Ambulatory Visit: Payer: Self-pay

## 2019-10-07 VITALS — HR 100

## 2019-10-07 VITALS — BP 135/95 | HR 105 | Temp 98.5°F | Resp 18 | Ht 69.0 in | Wt 250.0 lb

## 2019-10-07 DIAGNOSIS — M542 Cervicalgia: Secondary | ICD-10-CM | POA: Diagnosis not present

## 2019-10-07 DIAGNOSIS — C3412 Malignant neoplasm of upper lobe, left bronchus or lung: Secondary | ICD-10-CM | POA: Diagnosis present

## 2019-10-07 DIAGNOSIS — Z79899 Other long term (current) drug therapy: Secondary | ICD-10-CM | POA: Diagnosis not present

## 2019-10-07 DIAGNOSIS — C3492 Malignant neoplasm of unspecified part of left bronchus or lung: Secondary | ICD-10-CM | POA: Diagnosis not present

## 2019-10-07 DIAGNOSIS — M79671 Pain in right foot: Secondary | ICD-10-CM | POA: Diagnosis not present

## 2019-10-07 DIAGNOSIS — M109 Gout, unspecified: Secondary | ICD-10-CM

## 2019-10-07 DIAGNOSIS — C7951 Secondary malignant neoplasm of bone: Secondary | ICD-10-CM | POA: Insufficient documentation

## 2019-10-07 DIAGNOSIS — R600 Localized edema: Secondary | ICD-10-CM | POA: Diagnosis not present

## 2019-10-07 DIAGNOSIS — Z5111 Encounter for antineoplastic chemotherapy: Secondary | ICD-10-CM | POA: Diagnosis present

## 2019-10-07 DIAGNOSIS — Z5112 Encounter for antineoplastic immunotherapy: Secondary | ICD-10-CM | POA: Insufficient documentation

## 2019-10-07 LAB — CMP (CANCER CENTER ONLY)
ALT: 29 U/L (ref 0–44)
AST: 31 U/L (ref 15–41)
Albumin: 4.1 g/dL (ref 3.5–5.0)
Alkaline Phosphatase: 123 U/L (ref 38–126)
Anion gap: 12 (ref 5–15)
BUN: 10 mg/dL (ref 6–20)
CO2: 25 mmol/L (ref 22–32)
Calcium: 9.5 mg/dL (ref 8.9–10.3)
Chloride: 98 mmol/L (ref 98–111)
Creatinine: 0.8 mg/dL (ref 0.61–1.24)
GFR, Est AFR Am: >60 mL/min (ref >60)
GFR, Estimated: >60 mL/min (ref >60)
Glucose, Bld: 102 mg/dL — ABNORMAL HIGH (ref 70–99)
Potassium: 3.7 mmol/L (ref 3.5–5.1)
Sodium: 135 mmol/L (ref 135–145)
Total Bilirubin: 0.5 mg/dL (ref 0.3–1.2)
Total Protein: 8.7 g/dL — ABNORMAL HIGH (ref 6.5–8.1)

## 2019-10-07 LAB — CBC WITH DIFFERENTIAL (CANCER CENTER ONLY)
Abs Immature Granulocytes: 0.02 10*3/uL (ref 0.00–0.07)
Basophils Absolute: 0 10*3/uL (ref 0.0–0.1)
Basophils Relative: 0 %
Eosinophils Absolute: 0.3 10*3/uL (ref 0.0–0.5)
Eosinophils Relative: 4 %
HCT: 36.6 % — ABNORMAL LOW (ref 39.0–52.0)
Hemoglobin: 11.9 g/dL — ABNORMAL LOW (ref 13.0–17.0)
Immature Granulocytes: 0 %
Lymphocytes Relative: 35 %
Lymphs Abs: 2.9 10*3/uL (ref 0.7–4.0)
MCH: 31.6 pg (ref 26.0–34.0)
MCHC: 32.5 g/dL (ref 30.0–36.0)
MCV: 97.1 fL (ref 80.0–100.0)
Monocytes Absolute: 1.1 10*3/uL — ABNORMAL HIGH (ref 0.1–1.0)
Monocytes Relative: 13 %
Neutro Abs: 4 10*3/uL (ref 1.7–7.7)
Neutrophils Relative %: 48 %
Platelet Count: 380 10*3/uL (ref 150–400)
RBC: 3.77 MIL/uL — ABNORMAL LOW (ref 4.22–5.81)
RDW: 13.1 % (ref 11.5–15.5)
WBC Count: 8.4 10*3/uL (ref 4.0–10.5)
nRBC: 0 % (ref 0.0–0.2)

## 2019-10-07 LAB — TSH: TSH: 1.006 u[IU]/mL (ref 0.320–4.118)

## 2019-10-07 LAB — URIC ACID: Uric Acid, Serum: 5.9 mg/dL (ref 3.7–8.6)

## 2019-10-07 MED ORDER — PROCHLORPERAZINE MALEATE 10 MG PO TABS
ORAL_TABLET | ORAL | Status: AC
  Filled 2019-10-07: qty 1

## 2019-10-07 MED ORDER — OXYCODONE-ACETAMINOPHEN 5-325 MG PO TABS: 1.0000 | ORAL_TABLET | Freq: Three times a day (TID) | ORAL | 0 refills | Status: DC | PRN

## 2019-10-07 MED ORDER — SODIUM CHLORIDE 0.9 % IV SOLN
Freq: Once | INTRAVENOUS | Status: AC
  Administered 2019-10-07: 11:00:00 via INTRAVENOUS
  Filled 2019-10-07: qty 250

## 2019-10-07 MED ORDER — CYANOCOBALAMIN 1000 MCG/ML IJ SOLN
1000.0000 ug | Freq: Once | INTRAMUSCULAR | Status: AC
  Administered 2019-10-07: 1000 ug via INTRAMUSCULAR

## 2019-10-07 MED ORDER — INDOMETHACIN 50 MG PO CAPS: 50.0000 mg | ORAL_CAPSULE | Freq: Three times a day (TID) | ORAL | 0 refills | Status: DC

## 2019-10-07 MED ORDER — GABAPENTIN 300 MG PO CAPS: 300.0000 mg | ORAL_CAPSULE | Freq: Three times a day (TID) | ORAL | 3 refills | Status: DC

## 2019-10-07 MED ORDER — SODIUM CHLORIDE 0.9 % IV SOLN
200.0000 mg | Freq: Once | INTRAVENOUS | Status: AC
  Administered 2019-10-07: 200 mg via INTRAVENOUS
  Filled 2019-10-07: qty 8

## 2019-10-07 MED ORDER — PROCHLORPERAZINE MALEATE 10 MG PO TABS
10.0000 mg | ORAL_TABLET | Freq: Once | ORAL | Status: AC
  Administered 2019-10-07: 11:00:00 10 mg via ORAL

## 2019-10-07 MED ORDER — SODIUM CHLORIDE 0.9 % IV SOLN
500.0000 mg/m2 | Freq: Once | INTRAVENOUS | Status: AC
  Administered 2019-10-07: 1100 mg via INTRAVENOUS
  Filled 2019-10-07: qty 4

## 2019-10-07 NOTE — Patient Instructions (Signed)
[facility] Discharge Instructions for Patients Receiving Chemotherapy  Today you received the following chemotherapy agents: Keytruda, Alimta   To help prevent nausea and vomiting after your treatment, we encourage you to take your nausea medication as directed.    If you develop nausea and vomiting that is not controlled by your nausea medication, call the clinic.   BELOW ARE SYMPTOMS THAT SHOULD BE REPORTED IMMEDIATELY:  *FEVER GREATER THAN 100.5 F  *CHILLS WITH OR WITHOUT FEVER  NAUSEA AND VOMITING THAT IS NOT CONTROLLED WITH YOUR NAUSEA MEDICATION  *UNUSUAL SHORTNESS OF BREATH  *UNUSUAL BRUISING OR BLEEDING  TENDERNESS IN MOUTH AND THROAT WITH OR WITHOUT PRESENCE OF ULCERS  *URINARY PROBLEMS  *BOWEL PROBLEMS  UNUSUAL RASH Items with * indicate a potential emergency and should be followed up as soon as possible.  Feel free to call the clinic should you have any questions or concerns. The clinic phone number is [phone].  Please show the [facility] at check-in to the Emergency Department and triage nurse.

## 2019-10-07 NOTE — Progress Notes (Signed)
[place] OFFICE PROGRESS NOTE  Willie Beard, MD [address] [place] [address]  DIAGNOSIS: Stage IV (2B, N3, M1 C) non-small cell lung cancer, adenocarcinoma presented with left upper lobe lung mass in addition to mediastinal and left supraclavicular lymphadenopathy as well as bilateral pulmonary nodules, metastatic disease to T10 vertebral body, and a suspicious  mass near the pancreatic tail diagnosed in March 2020.  PDL1 Expression:   Molecular Studies by [employer]:  PRIOR THERAPY: None   CURRENT THERAPY: Systemic chemotherapy with carboplatin for AUC of 5, Alimta 500 mg/M2 and Keytruda 200 mg IV every 3 weeks. First dose March20, 2020.Status post 10cycles.  INTERVAL HISTORY: Willie Schmidt 58 y.o. male returns to the clinic for a follow up visit. The patient is feeling well today without any concerning complaints except for persistent right shoulder/neck pain. He had an MRI recently to evaluate this which showed arthritis. He also has been having pain in his right foot which was interfere with his ability to walk. He localizes his pain to being the most painful just below his right big toe. He was recently treated for cellulitis last month and recently just completed his antibiotics. He denies any fevers or warmth to this region.  Regarding his treatment with chemotherapy/immunotherapy, he is tolerating treatment well. He denies any night sweats, chills, or weight loss. He reports his baseline shortness of breath which is improved with the use of his inhaler. He denies any chest pain, cough, or hemoptysis. He denies nausea, vomiting, or diarrhea. He has some constipation secondary to his pain medication use. He has a bowel regimen that works for him though. He denies any headaches and visual changes. He recently had a restaging CT scan performed. He is here today for evaluation and to review his CT scan before starting cycle #11.   MEDICAL HISTORY: Past  Medical History:  Diagnosis Date  . Bronchitis, chronic ([place])   . Hypertension   . metastatic lung ca dx'd 02/2019   lyphadenopathy, bil pulmonary noduels; suspected bone lesions    ALLERGIES:  has No Known Allergies.  MEDICATIONS:  Current Outpatient Medications  Medication Sig Dispense Refill  . albuterol (PROVENTIL HFA;VENTOLIN HFA) 108 (90 BASE) MCG/ACT inhaler Inhale 2 puffs into the lungs every 6 (six) hours as needed for wheezing. 1 Inhaler 2  . amLODipine (NORVASC) 5 MG tablet TK 1 T PO Q NIGHT    . aspirin 81 MG tablet Take 81 mg by mouth daily.    . clindamycin (CLEOCIN) 150 MG capsule Take 3 capsules (450 mg total) by mouth 3 (three) times daily. 90 capsule 0  . cyclobenzaprine (FLEXERIL) 10 MG tablet TAKE 1 TABLET BY MOUTH THREE TIMES DAILY AS NEEDED FOR MUSCLE SPASMS 30 tablet 0  . DULoxetine (CYMBALTA) 30 MG capsule 1 once daily for 1 week then increase to 1 twice daily 60 capsule 3  . folic acid (FOLVITE) 1 MG tablet TAKE 1 TABLET(1 MG) BY MOUTH DAILY 30 tablet 4  . gabapentin (NEURONTIN) 300 MG capsule Take 1 capsule (300 mg total) by mouth 3 (three) times daily. 90 capsule 3  . lisinopril-hydrochlorothiazide (ZESTORETIC) 20-25 MG tablet Take 1 tablet by mouth daily.    . meloxicam (MOBIC) 15 MG tablet TK 1 T PO QD WF    . oxyCODONE-acetaminophen (PERCOCET/ROXICET) 5-325 MG tablet Take 1 tablet by mouth every 8 (eight) hours as needed for severe pain. 30 tablet 0  . prochlorperazine (COMPAZINE) 10 MG tablet  TAKE 1 TABLET(10 MG) BY MOUTH EVERY 6 HOURS AS NEEDED FOR NAUSEA OR VOMITING 30 tablet 0  . indomethacin (INDOCIN) 50 MG capsule Take 1 capsule (50 mg total) by mouth 3 (three) times daily with meals. 12 capsule 0   No current facility-administered medications for this visit.     SURGICAL HISTORY: No past surgical history on file.  REVIEW OF SYSTEMS:   Review of Systems  Constitutional: Negative for appetite change, chills, fatigue, fever and unexpected weight  change.  HENT: Negative for mouth sores, nosebleeds, sore throat and trouble swallowing.   Eyes: Negative for eye problems and icterus.  Respiratory: Positive for shortness of breath. Negative for cough, hemoptysis, and wheezing.   Cardiovascular: Negative for chest pain and leg swelling.  Gastrointestinal: Positive for baseline constipation. Negative for abdominal pain, diarrhea, nausea and vomiting.  Genitourinary: Negative for bladder incontinence, difficulty urinating, dysuria, frequency and hematuria.   Musculoskeletal: Positive for right shoulder/neck pain. Positive for pain below right big toe. Negative for back pain, gait problem, and neck stiffness.  Skin: Negative for itching and rash.  Neurological: Negative for dizziness, extremity weakness, gait problem, headaches, light-headedness and seizures.  Hematological: Negative for adenopathy. Does not bruise/bleed easily.  Psychiatric/Behavioral: Negative for confusion, depression and sleep disturbance. The patient is not nervous/anxious.     PHYSICAL EXAMINATION:  Blood pressure (!) 135/95, pulse (!) 105, temperature 98.5 F (36.9 C), temperature source Temporal, resp. rate 18, height _0  (1.753 m), weight 250 lb (113.4 kg), SpO2 100 %.  ECOG PERFORMANCE STATUS: 1 - Symptomatic but completely ambulatory  Physical Exam  Constitutional: Oriented to person, place, and time and well-developed, well-nourished, and in no distress.  HENT:  Head: Normocephalic and atraumatic.  Mouth/Throat: Oropharynx is clear and moist. No oropharyngeal exudate.  Eyes: Conjunctivae are normal. Right eye exhibits no discharge. Left eye exhibits no discharge. No scleral icterus.  Neck: Normal range of motion. Neck supple.  Cardiovascular: Normal rate, regular rhythm, normal heart sounds and intact distal pulses.   Pulmonary/Chest: Effort normal and breath sounds normal. No respiratory distress. No wheezes. No rales.  Abdominal: Soft. Bowel sounds are  normal. Exhibits no distension and no mass. There is no tenderness.  Musculoskeletal: Tenderness to palpation of right foot, particularly below right big toe. Normal range of motion. Exhibits no edema.  Lymphadenopathy:    No cervical adenopathy.  Neurological: Alert and oriented to person, place, and time. Exhibits normal muscle tone. Coordination normal. Patient limping secondary to foot pain.  Skin: Skin is warm and dry. No rash noted. Not diaphoretic. No erythema. No pallor. No drainage/purulent discharge Psychiatric: Mood, memory and judgment normal.  Vitals reviewed.  LABORATORY DATA: Lab Results  Component Value Date   WBC 8.4 10/07/2019   HGB 11.9 (L) 10/07/2019   HCT 36.6 (L) 10/07/2019   MCV 97.1 10/07/2019   PLT 380 10/07/2019      Chemistry      Component Value Date/Time   NA 132 (L) 09/21/2019 1144   K 3.7 09/21/2019 1144   CL 93 (L) 09/21/2019 1144   CO2 23 09/21/2019 1144   BUN 10 09/21/2019 1144   CREATININE 0.67 09/21/2019 1144   CREATININE 0.79 09/16/2019 0949      Component Value Date/Time   CALCIUM 9.4 09/21/2019 1144   ALKPHOS 111 09/21/2019 1144   AST 34 09/21/2019 1144   AST 29 09/16/2019 0949   ALT 40 09/21/2019 1144   ALT 27 09/16/2019 0949   BILITOT 1.0 09/21/2019  1144   BILITOT 0.5 09/16/2019 0949       RADIOGRAPHIC STUDIES:  Ct Chest W Contrast  Result Date: 09/21/2019 CLINICAL DATA:  Lung cancer restaging EXAM: CT CHEST, ABDOMEN, AND PELVIS WITH CONTRAST TECHNIQUE: Multidetector CT imaging of the chest, abdomen and pelvis was performed following the standard protocol during bolus administration of intravenous contrast. CONTRAST:  158m OMNIPAQUE IOHEXOL 300 MG/ML SOLN, additional oral enteric contrast COMPARISON:  CT chest abdomen pelvis, 07/12/2019, CT chest, 05/11/2019, CT abdomen pelvis, 03/31/2019, CT chest, 02/14/2019 FINDINGS: CT CHEST FINDINGS Cardiovascular: Aortic atherosclerosis. Normal heart size. Scattered coronary artery  calcifications. No pericardial effusion. Mediastinum/Nodes: No change in soft tissue thickening about the left hilum and AP window. No discretely enlarged mediastinal lymph nodes. No change in enlarged left supraclavicular lymph nodes, the largest measuring 1.5 x 0.9 cm (series 2, image 10). Thyroid gland, trachea, and esophagus demonstrate no significant findings. Lungs/Pleura: Redemonstrated trace left pleural effusion. Interval improvement in left upper lobe nodularity, with near resolution of multiple previously seen nodules, with persistent irregular opacity and architectural distortion throughout the left upper lobe. Largest residual nodule of the lateral left upper lobe measures 7 mm, previously 1.0 cm (series 7, image 55). No change in a subtle 9 mm ground-glass nodule of the posterior right upper lobe (series 7, image 54). Musculoskeletal: No chest wall mass. CT ABDOMEN PELVIS FINDINGS Hepatobiliary: No solid liver abnormality is seen. No gallstones, gallbladder wall thickening, or biliary dilatation. Pancreas: A previously seen hypodense lesion of the pancreatic tail is resolved, with persistent mild parenchymal atrophy of the pancreatic tail (series 2, image 62). No pancreatic ductal dilatation or surrounding inflammatory changes. Spleen: Normal in size without significant abnormality. Adrenals/Urinary Tract: Adrenal glands are unremarkable. New, mild perirenal fat stranding (series 2, image 69). Kidneys are otherwise normal, without renal calculi, solid lesion, or hydronephrosis. Bladder is unremarkable. Stomach/Bowel: Stomach is within normal limits. Appendix appears normal. No evidence of bowel wall thickening, distention, or inflammatory changes. Vascular/Lymphatic: Aortic atherosclerosis. No enlarged abdominal or pelvic lymph nodes. Reproductive: No mass or other abnormality. Other: No abdominal wall hernia or abnormality. No abdominopelvic ascites. Musculoskeletal: Unchanged sclerotic lesion of the  T10 vertebral body (series 6, image 107). Unchanged superior endplate deformity with adjacent sclerosis of the right aspect of L5 (series 6, image 100). Unchanged sclerotic lesion of the right femoral neck (series 5, image 107). IMPRESSION: 1. Interval improvement in left upper lobe nodularity, with near resolution of multiple previously seen nodules, with persistent irregular opacity and architectural distortion throughout the left upper lobe. Largest residual nodule of the lateral left upper lobe measures 7 mm, previously 1.0 cm (series 7, image 55). Findings are generally consistent with treatment response and developing post treatment/post radiation change. 2. No change in soft tissue thickening about the left hilum and AP window. No discretely enlarged mediastinal lymph nodes. No change in enlarged left supraclavicular lymph nodes, the largest measuring 1.5 x 0.9 cm (series 2, image 10). 3. No change in a subtle 9 mm ground-glass nodule of the posterior right upper lobe (series 7, image 54), nonspecific although favor incidental infection or inflammation. Attention on follow-up. 4. A previously seen hypodense lesion of the pancreatic tail is resolved, with persistent mild parenchymal atrophy of the pancreatic tail (series 2, image 62). Findings again consistent with treatment response. 5. No change in sclerotic osseous metastatic disease of T10 and L5. Lesion of the right femoral neck is likely an incidental enchondroma or other benign lesion. Attention on follow-up. 6. New,  mild perirenal fat stranding (series 2, image 69), infectious or inflammatory, differential considerations including drug toxicity. Correlate with urinalysis. 7.  Aortic Atherosclerosis (ICD10-I70.0). Electronically Signed   By: Eddie Candle M.D.   On: 09/21/2019 12:27   Nm Bone Scan Whole Body  Result Date: 09/09/2019 CLINICAL DATA:  Neck pain.  History of lung cancer. EXAM: NUCLEAR MEDICINE WHOLE BODY BONE SCAN TECHNIQUE: Whole body  anterior and posterior images were obtained approximately 3 hours after intravenous injection of radiopharmaceutical. RADIOPHARMACEUTICALS:  22.0 mCi Technetium-38mMDP IV COMPARISON:  CT scan of July 12, 2019. FINDINGS: Focus of abnormal uptake is noted in left foot most consistent with degenerative change. Abnormal uptake is seen involving the posterior portion of L5 consistent with metastatic disease as described on prior CT scan. Focus of abnormal uptake is seen on the right side of the cervical spine posteriorly at approximately the C3 or C4 level. IMPRESSION: Abnormal uptake is seen involving posterior portion of L5 vertebral body consistent with metastatic disease as described on prior CT scan. There is also noted at focus of abnormal uptake involving the right side of the cervical spine posteriorly at approximately the C3 or C4 level, which may represent metastatic disease or potentially degenerative change. Further evaluation with radiographs or MRI is recommended. Electronically Signed   By: JMarijo ConceptionM.D.   On: 09/09/2019 13:22   Ct Abdomen Pelvis W Contrast  Result Date: 09/21/2019 CLINICAL DATA:  Lung cancer restaging EXAM: CT CHEST, ABDOMEN, AND PELVIS WITH CONTRAST TECHNIQUE: Multidetector CT imaging of the chest, abdomen and pelvis was performed following the standard protocol during bolus administration of intravenous contrast. CONTRAST:  1032mOMNIPAQUE IOHEXOL 300 MG/ML SOLN, additional oral enteric contrast COMPARISON:  CT chest abdomen pelvis, 07/12/2019, CT chest, 05/11/2019, CT abdomen pelvis, 03/31/2019, CT chest, 02/14/2019 FINDINGS: CT CHEST FINDINGS Cardiovascular: Aortic atherosclerosis. Normal heart size. Scattered coronary artery calcifications. No pericardial effusion. Mediastinum/Nodes: No change in soft tissue thickening about the left hilum and AP window. No discretely enlarged mediastinal lymph nodes. No change in enlarged left supraclavicular lymph nodes, the largest  measuring 1.5 x 0.9 cm (series 2, image 10). Thyroid gland, trachea, and esophagus demonstrate no significant findings. Lungs/Pleura: Redemonstrated trace left pleural effusion. Interval improvement in left upper lobe nodularity, with near resolution of multiple previously seen nodules, with persistent irregular opacity and architectural distortion throughout the left upper lobe. Largest residual nodule of the lateral left upper lobe measures 7 mm, previously 1.0 cm (series 7, image 55). No change in a subtle 9 mm ground-glass nodule of the posterior right upper lobe (series 7, image 54). Musculoskeletal: No chest wall mass. CT ABDOMEN PELVIS FINDINGS Hepatobiliary: No solid liver abnormality is seen. No gallstones, gallbladder wall thickening, or biliary dilatation. Pancreas: A previously seen hypodense lesion of the pancreatic tail is resolved, with persistent mild parenchymal atrophy of the pancreatic tail (series 2, image 62). No pancreatic ductal dilatation or surrounding inflammatory changes. Spleen: Normal in size without significant abnormality. Adrenals/Urinary Tract: Adrenal glands are unremarkable. New, mild perirenal fat stranding (series 2, image 69). Kidneys are otherwise normal, without renal calculi, solid lesion, or hydronephrosis. Bladder is unremarkable. Stomach/Bowel: Stomach is within normal limits. Appendix appears normal. No evidence of bowel wall thickening, distention, or inflammatory changes. Vascular/Lymphatic: Aortic atherosclerosis. No enlarged abdominal or pelvic lymph nodes. Reproductive: No mass or other abnormality. Other: No abdominal wall hernia or abnormality. No abdominopelvic ascites. Musculoskeletal: Unchanged sclerotic lesion of the T10 vertebral body (series 6, image 107). Unchanged  superior endplate deformity with adjacent sclerosis of the right aspect of L5 (series 6, image 100). Unchanged sclerotic lesion of the right femoral neck (series 5, image 107). IMPRESSION: 1.  Interval improvement in left upper lobe nodularity, with near resolution of multiple previously seen nodules, with persistent irregular opacity and architectural distortion throughout the left upper lobe. Largest residual nodule of the lateral left upper lobe measures 7 mm, previously 1.0 cm (series 7, image 55). Findings are generally consistent with treatment response and developing post treatment/post radiation change. 2. No change in soft tissue thickening about the left hilum and AP window. No discretely enlarged mediastinal lymph nodes. No change in enlarged left supraclavicular lymph nodes, the largest measuring 1.5 x 0.9 cm (series 2, image 10). 3. No change in a subtle 9 mm ground-glass nodule of the posterior right upper lobe (series 7, image 54), nonspecific although favor incidental infection or inflammation. Attention on follow-up. 4. A previously seen hypodense lesion of the pancreatic tail is resolved, with persistent mild parenchymal atrophy of the pancreatic tail (series 2, image 62). Findings again consistent with treatment response. 5. No change in sclerotic osseous metastatic disease of T10 and L5. Lesion of the right femoral neck is likely an incidental enchondroma or other benign lesion. Attention on follow-up. 6. New, mild perirenal fat stranding (series 2, image 69), infectious or inflammatory, differential considerations including drug toxicity. Correlate with urinalysis. 7.  Aortic Atherosclerosis (ICD10-I70.0). Electronically Signed   By: Eddie Candle M.D.   On: 09/21/2019 12:27   Mr Shoulder Right Wo Contrast  Result Date: 09/22/2019 CLINICAL DATA:  Right shoulder and right arm pain. EXAM: MRI OF THE RIGHT SHOULDER WITHOUT CONTRAST TECHNIQUE: Multiplanar, multisequence MR imaging of the shoulder was performed. No intravenous contrast was administered. COMPARISON:  Radiographs from 09/03/2019 FINDINGS: Rotator cuff: Mild to moderate distal supraspinatus tendinopathy. Mild infraspinatus  tendinopathy. Muscles:  Unremarkable Biceps long head:  Unremarkable Acromioclavicular Joint: Moderate spurring and mild to moderate subcortical marrow edema. Type II acromion. No significant bursitis. Glenohumeral Joint: Mild degenerative glenohumeral chondral thinning. Minimal spurring of the humeral head. Mild degenerative subcortical cyst formation posteriorly along the humeral head. Labrum:  Grossly unremarkable Bones: No significant extra-articular osseous abnormalities identified. Other: No supplemental non-categorized findings. IMPRESSION: 1. Mild to moderate distal supraspinatus tendinopathy and mild infraspinatus tendinopathy. 2. Moderate degenerative AC joint arthropathy and mild degenerative glenohumeral arthropathy. Electronically Signed   By: Van Clines M.D.   On: 09/22/2019 10:36   Dg Chest Portable 1 View  Result Date: 09/21/2019 CLINICAL DATA:  Nodular densities and left upper lobe opacity seen on the previous study have improved. Linear areas extended to the left upper chest, small nodules and other findings in this area better seen on chest CT acquired on 09/21/2019. No signs of dense consolidation. Tiny left effusion not seen on the current exam. Right lung is clear. Cardiomediastinal contours with stable mild cardiac enlargement. EXAM: PORTABLE CHEST 1 VIEW COMPARISON:  Chest CT of the same date and chest CT from July 12, 2019 FINDINGS: The heart size and mediastinal contours are within normal limits. Both lungs are clear. The visualized skeletal structures are unremarkable. IMPRESSION: Improved appearance of the chest based on comparison with prior chest CTs, still with some areas of linear year opacity in smaller nodular densities in the left upper lobe, refer to chest CT acquired the same date. Electronically Signed   By: Zetta Bills M.D.   On: 09/21/2019 12:29   Vas [place] Lower Extremity Venous (dvt) (  only Mc & Wl 7a-7p)  Result Date: 09/21/2019  Lower Venous Study  Indications: Pain, and Swelling.  Limitations: Pain. Comparison Study: No prior study. Performing Technologist: Maudry Mayhew MHA, RDMS, RVT, RDCS  Examination Guidelines: A complete evaluation includes B-mode imaging, spectral Doppler, color Doppler, and power Doppler as needed of all accessible portions of each vessel. Bilateral testing is considered an integral part of a complete examination. Limited examinations for reoccurring indications may be performed as noted.  +---------+---------------+---------+-----------+----------+--------------+ RIGHT    CompressibilityPhasicitySpontaneityPropertiesThrombus Aging +---------+---------------+---------+-----------+----------+--------------+ CFV      Full           Yes      Yes                                 +---------+---------------+---------+-----------+----------+--------------+ SFJ      Full                                                        +---------+---------------+---------+-----------+----------+--------------+ FV Prox  Full                                                        +---------+---------------+---------+-----------+----------+--------------+ FV Mid   Full                                                        +---------+---------------+---------+-----------+----------+--------------+ FV DistalFull                                                        +---------+---------------+---------+-----------+----------+--------------+ PFV      Full                                                        +---------+---------------+---------+-----------+----------+--------------+ POP      Full           Yes      Yes                                 +---------+---------------+---------+-----------+----------+--------------+ PTV      Full                                                        +---------+---------------+---------+-----------+----------+--------------+ PERO     Full                                                         +---------+---------------+---------+-----------+----------+--------------+  Left CFV not visualized.  Summary: Right: There is no evidence of deep vein thrombosis in the lower extremity. No cystic structure found in the popliteal fossa.  *See table(s) above for measurements and observations. Electronically signed by Deitra Mayo MD on 09/21/2019 at 4:29:26 PM.    Final      ASSESSMENT/PLAN:  This is a very pleasant 58 year old African-American male with stage IV non-small cell lung cancer, adenocarcinoma. He presented with a large left upper lobe lung mass with mediastinal andleft supraclavicular lymphadenopathy as well as bilateral pulmonary nodules. He was also found to have metastatic disease to the T10 vertebral body.His brain MRI was negative for any metastatic disease. He was diagnosed in March 2020.   The patient is currently undergoing treatment with carboplatin for an AUC of 5, Alimta 500 mg/m, and Keytruda 200 mg IV every 3 weeks. He is status post 13 cycles. Starting from cycle #5, the patient has been receiving maintenance treatment with Keytruda 200 mg IV and Alimta 500 mg/m IV every 3 weeks.  The patient recently had a restaging CT scan of his chest, abdomen, and pelvis. Dr. Julien Nordmann personally and independently reviewed the scan and discuss results with patient today.  Scan did not show any concerning evidence for disease progression.  Dr. Julien Nordmann recommends that the patient continue on the same treatment at the same dose.  He will receive cycle #11 today scheduled.  The patient expressed his interest in proceeding with treatment.  He is not interested in hospice at this time.  We will see the patient back for follow-up visit in 3 weeks for evaluation before starting cycle #12.  Regarding the patient's right shoulder pain, I have placed a referral to orthopedics to further evaluate and manage his condition.  I sent a  refill for Percocet as well as gabapentin to the patient's pharmacy.  Additionally, I have sent a prescription for indomethacin 50 mg 3 times daily for 4 days for gout. The patient was advised to avoid concomitate use of other NSAIDs during this time. He also was instructed to start this prescription a few days after his chemotherapy to avoid renal toxicity.   The patient was advised to call immediately if he has any concerning symptoms in the interval. The patient voices understanding of current disease status and treatment options and is in agreement with the current care plan. All questions were answered. The patient knows to call the clinic with any problems, questions or concerns. We can certainly see the patient much sooner if necessary   Orders Placed This Encounter  Procedures  . CBC with Differential (Cancer Center Only)    Standing Status:   Standing    Number of Occurrences:   12    Standing Expiration Date:   10/06/2020  . CMP ([facility] only)    Standing Status:   Standing    Number of Occurrences:   12    Standing Expiration Date:   10/06/2020  . Ambulatory referral to Orthopedic Surgery    Referral Priority:   Routine    Referral Type:   Surgical    Referral Reason:   Specialty Services Required    Referred to Provider:   Meredith Pel, MD    Requested Specialty:   Orthopedic Surgery    Number of Visits Requested:   [place], PA-C 10/07/19  ADDENDUM: Hematology/Oncology Attending: I had a face-to-face encounter with the patient today.  I recommended his care plan.  This is a very pleasant 58 years old African-American male with metastatic non-small cell lung cancer, adenocarcinoma with no actionable mutations.  The patient was started on induction systemic chemotherapy with carboplatin, Alimta and Keytruda for 4 cycles and is currently undergoing systemic chemotherapy with maintenance Alimta and Keytruda status post 6 cycles.  He has been  tolerating this treatment well with no concerning adverse effect except for mild fatigue.  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 He had repeat CT scan of the chest, abdomen pelvis performed recently.  I personally and independently reviewed the scan images and discussed the results with the patient today. His scan showed no concerning findings for disease progression. I recommended for the patient to continue his current treatment and he will proceed with cycle #7 of his maintenance therapy today as planned. I discussed with him briefly the option of palliative care and hospice but the patient is not interested in this option and he would like to continue with systemic chemotherapy. He will come back for follow-up visit in 3 weeks for evaluation before the next cycle of his treatment. He was advised to call immediately if he has any concerning symptoms in the interval.  Disclaimer: This note was dictated with voice recognition software. Similar sounding words can inadvertently be transcribed and may be missed upon review. Eilleen Kempf, MD 10/07/19

## 2019-10-14 ENCOUNTER — Telehealth: Payer: Self-pay | Admitting: *Deleted

## 2019-10-14 NOTE — Telephone Encounter (Signed)
Call received from [name] ( daughter ) states " I'm calling because my dad left his appt depressed and said things had progressed." Im not sure what he meant. Reviewed with MD, returned call to [name] and advised MD went over pt recent scan, there was no concerning finding, pt and MD discussed continuing treatment, Mr Betker verbalized he wants to continue treatment.  [name] thanked me for the call said she will share this information with her sister. No further concerns at this time.

## 2019-10-19 ENCOUNTER — Other Ambulatory Visit: Payer: Self-pay | Admitting: Medical

## 2019-10-19 ENCOUNTER — Telehealth: Payer: Self-pay | Admitting: Medical Oncology

## 2019-10-19 NOTE — Telephone Encounter (Signed)
Persistent pain after tx for gout . Both legs feet to calf. Right > left. "burning , painful to touch and feet are puffy on the bottom". Per Julien Nordmann see Lucianne Lei tomorrow. Pt scheduled and notified of appt.

## 2019-10-20 ENCOUNTER — Ambulatory Visit (HOSPITAL_COMMUNITY)
Admission: RE | Admit: 2019-10-20 | Discharge: 2019-10-20 | Disposition: A | Discharge status: Home / Self Care | Payer: Medicaid Other | Source: Ambulatory Visit | Attending: Medical | Admitting: Medical

## 2019-10-20 ENCOUNTER — Other Ambulatory Visit: Payer: Self-pay

## 2019-10-20 ENCOUNTER — Inpatient Hospital Stay (HOSPITAL_BASED_OUTPATIENT_CLINIC_OR_DEPARTMENT_OTHER): Payer: Medicaid Other | Admitting: Medical

## 2019-10-20 ENCOUNTER — Other Ambulatory Visit: Payer: Medicaid Other | Admitting: Internal Medicine

## 2019-10-20 ENCOUNTER — Telehealth: Payer: Self-pay | Admitting: Medical Oncology

## 2019-10-20 ENCOUNTER — Inpatient Hospital Stay: Payer: Medicaid Other

## 2019-10-20 ENCOUNTER — Other Ambulatory Visit: Payer: Self-pay | Admitting: Emergency Medicine

## 2019-10-20 VITALS — BP 141/101 | HR 104 | Temp 98.0°F | Resp 18 | Ht 69.0 in | Wt 250.7 lb

## 2019-10-20 DIAGNOSIS — C3492 Malignant neoplasm of unspecified part of left bronchus or lung: Secondary | ICD-10-CM

## 2019-10-20 DIAGNOSIS — M79671 Pain in right foot: Secondary | ICD-10-CM

## 2019-10-20 DIAGNOSIS — Z5112 Encounter for antineoplastic immunotherapy: Secondary | ICD-10-CM | POA: Diagnosis not present

## 2019-10-20 DIAGNOSIS — R600 Localized edema: Secondary | ICD-10-CM | POA: Diagnosis present

## 2019-10-20 LAB — CBC WITH DIFFERENTIAL (CANCER CENTER ONLY)
Abs Immature Granulocytes: 0.02 10*3/uL (ref 0.00–0.07)
Basophils Absolute: 0 10*3/uL (ref 0.0–0.1)
Basophils Relative: 0 %
Eosinophils Absolute: 0.3 10*3/uL (ref 0.0–0.5)
Eosinophils Relative: 4 %
HCT: 36.7 % — ABNORMAL LOW (ref 39.0–52.0)
Hemoglobin: 11.9 g/dL — ABNORMAL LOW (ref 13.0–17.0)
Immature Granulocytes: 0 %
Lymphocytes Relative: 40 %
Lymphs Abs: 3.4 10*3/uL (ref 0.7–4.0)
MCH: 31.7 pg (ref 26.0–34.0)
MCHC: 32.4 g/dL (ref 30.0–36.0)
MCV: 97.9 fL (ref 80.0–100.0)
Monocytes Absolute: 1.3 10*3/uL — ABNORMAL HIGH (ref 0.1–1.0)
Monocytes Relative: 16 %
Neutro Abs: 3.5 10*3/uL (ref 1.7–7.7)
Neutrophils Relative %: 40 %
Platelet Count: 262 10*3/uL (ref 150–400)
RBC: 3.75 MIL/uL — ABNORMAL LOW (ref 4.22–5.81)
RDW: 13.6 % (ref 11.5–15.5)
WBC Count: 8.6 10*3/uL (ref 4.0–10.5)
nRBC: 0 % (ref 0.0–0.2)

## 2019-10-20 LAB — CMP (CANCER CENTER ONLY)
ALT: 32 U/L (ref 0–44)
AST: 31 U/L (ref 15–41)
Albumin: 4.1 g/dL (ref 3.5–5.0)
Alkaline Phosphatase: 129 U/L — ABNORMAL HIGH (ref 38–126)
Anion gap: 13 (ref 5–15)
BUN: 11 mg/dL (ref 6–20)
CO2: 25 mmol/L (ref 22–32)
Calcium: 9.7 mg/dL (ref 8.9–10.3)
Chloride: 98 mmol/L (ref 98–111)
Creatinine: 0.77 mg/dL (ref 0.61–1.24)
GFR, Est AFR Am: >60 mL/min (ref >60)
GFR, Estimated: >60 mL/min (ref >60)
Glucose, Bld: 96 mg/dL (ref 70–99)
Potassium: 3.8 mmol/L (ref 3.5–5.1)
Sodium: 136 mmol/L (ref 135–145)
Total Bilirubin: 0.4 mg/dL (ref 0.3–1.2)
Total Protein: 8.7 g/dL — ABNORMAL HIGH (ref 6.5–8.1)

## 2019-10-20 LAB — URIC ACID: Uric Acid, Serum: 5.5 mg/dL (ref 3.7–8.6)

## 2019-10-20 MED ORDER — POTASSIUM CHLORIDE CRYS ER 20 MEQ PO TBCR: EXTENDED_RELEASE_TABLET | ORAL | 1 refills | Status: DC

## 2019-10-20 MED ORDER — FUROSEMIDE 20 MG PO TABS: ORAL_TABLET | ORAL | 1 refills | Status: DC

## 2019-10-20 MED ORDER — SULFAMETHOXAZOLE-TRIMETHOPRIM 800-160 MG PO TABS: 1.0000 | ORAL_TABLET | Freq: Two times a day (BID) | ORAL | 0 refills | Status: DC

## 2019-10-20 NOTE — Patient Instructions (Signed)
COVID-19: How to Protect Yourself and Others Know how it spreads  There is currently no vaccine to prevent coronavirus disease 2019 (COVID-19).  The best way to prevent illness is to avoid being exposed to this virus.  The virus is thought to spread mainly from person-to-person. ? Between people who are in close contact with one another (within about 6 feet). ? Through respiratory droplets produced when an infected person coughs, sneezes or talks. ? These droplets can land in the mouths or noses of people who are nearby or possibly be inhaled into the lungs. ? Some recent studies have suggested that COVID-19 may be spread by people who are not showing symptoms. Everyone should Clean your hands often  Wash your hands often with soap and water for at least 20 seconds especially after you have been in a public place, or after blowing your nose, coughing, or sneezing.  If soap and water are not readily available, use a hand sanitizer that contains at least 60% alcohol. Cover all surfaces of your hands and rub them together until they feel dry.  Avoid touching your eyes, nose, and mouth with unwashed hands. Avoid close contact  Stay home if you are sick.  Avoid close contact with people who are sick.  Put distance between yourself and other people. ? Remember that some people without symptoms may be able to spread virus. ? This is especially important for people who are at higher risk of getting very sick.www.cdc.gov/coronavirus/2019-ncov/need-extra-precautions/people-at-higher-risk.html Cover your mouth and nose with a cloth face cover when around others  You could spread COVID-19 to others even if you do not feel sick.  Everyone should wear a cloth face cover when they have to go out in public, for example to the grocery store or to pick up other necessities. ? Cloth face coverings should not be placed on young children under age 2, anyone who has trouble breathing, or is unconscious,  incapacitated or otherwise unable to remove the mask without assistance.  The cloth face cover is meant to protect other people in case you are infected.  Do NOT use a facemask meant for a healthcare worker.  Continue to keep about 6 feet between yourself and others. The cloth face cover is not a substitute for social distancing. Cover coughs and sneezes  If you are in a private setting and do not have on your cloth face covering, remember to always cover your mouth and nose with a tissue when you cough or sneeze or use the inside of your elbow.  Throw used tissues in the trash.  Immediately wash your hands with soap and water for at least 20 seconds. If soap and water are not readily available, clean your hands with a hand sanitizer that contains at least 60% alcohol. Clean and disinfect  Clean AND disinfect frequently touched surfaces daily. This includes tables, doorknobs, light switches, countertops, handles, desks, phones, keyboards, toilets, faucets, and sinks. www.cdc.gov/coronavirus/2019-ncov/prevent-getting-sick/disinfecting-your-home.html  If surfaces are dirty, clean them: Use detergent or soap and water prior to disinfection.  Then, use a household disinfectant. You can see a list of EPA-registered household disinfectants here. cdc.gov/coronavirus 04/27/2019 This information is not intended to replace advice given to you by your health care provider. Make sure you discuss any questions you have with your health care provider. Document Released: 04/06/2019 Document Revised: 05/05/2019 Document Reviewed: 04/06/2019 Elsevier Patient Education  2020 [employer]  

## 2019-10-20 NOTE — Progress Notes (Signed)
Pt transported to vascular appt by PA Lucianne Lei with belongings via w/c.  Pt returned to [facility] lobby to discuss results with PA Lucianne Lei.

## 2019-10-20 NOTE — Progress Notes (Signed)
Lower extremity venous has been completed.   Preliminary results in CV Proc.   Abram Sander 10/20/2019 11:29 AM

## 2019-10-20 NOTE — Progress Notes (Signed)
[facility] Progress Note   Willie Schmidt [id number] 10-25-1961 58 y.o.  NYSIR [name] is managed by Dr. Fanny Bien. [name]  Actively treated with chemotherapy/immunotherapy/hormonal therapy: yes  Current therapy: Maintenance treatment with Keytruda 200 mg IV and Alimta 500 mg/m IV every 3 weeks.  Last treated: 10/07/2019  Next scheduled appointment with provider: 10/28/2019  Assessment: Plan:    Adenocarcinoma of left lung, stage 4 (HCC)  Right foot pain - Plan: Uric acid, VAS [place] LOWER EXTREMITY VENOUS (DVT)  Edema of right lower extremity - Plan: VAS [place] LOWER EXTREMITY VENOUS (DVT), furosemide (LASIX) 20 MG tablet, sulfamethoxazole-trimethoprim (BACTRIM DS) 800-160 MG tablet, potassium chloride SA (KLOR-CON) 20 MEQ tablet   Stage IV (2B, N3, M1 C) non-small cell lung cancer, adenocarcinoma: He presented with left upper lobe lung mass in addition to mediastinal and left supraclavicular lymphadenopathy as well as bilateral pulmonary nodules, metastatic disease to T10 vertebral body, and a suspicious mass near the pancreatic tail when he was diagnosed in March 2020. [name] is managed Dr. Fanny Bien. [name] and is currently receiving maintenance treatment with Keytruda and Alimta every 3 weeks. He was last treated on 10/07/2019.  Right foot pain and right lower extremity edema: A CBC returned with a WBC of 8.6 with an [place] of 3.5. His uric acid returned at 5.5. A venous doppler US returned negative for a DVT.  [name] was given a prescription for Bactrim DS 1 p.o. twice daily x7 days, Lasix 20 mg p.o. once daily x3 days with instructions to repeat as needed for recurrent edema, and potassium chloride 20 mEq 1 tablet each day with Lasix.  Please see After Visit Summary for patient specific instructions.  Future Appointments  Date Time Provider [place]  10/25/2019  1:30 PM Meredith Pel, MD [facility] None  10/28/2019  9:15 AM [facility] 5  [facility] None  10/28/2019  9:45 AM Curt Bears, MD [facility] None  10/28/2019 10:30 AM [facility] INFUSION [facility] None  11/17/2019  9:30 AM [facility] LAB 5 [facility] None  11/17/2019 10:00 AM Curt Bears, MD [facility] None  11/17/2019 11:00 AM [facility] INFUSION [facility] None    Orders Placed This Encounter  Procedures   Uric acid   VAS [place] LOWER EXTREMITY VENOUS (DVT)       Subjective:   [name] is a 58 y.o. (DOB October 17, 1961) male.  Chief Complaint:  Chief Complaint  Patient presents with   Foot Pain    Right    HPI [name]  Is a 58 y.o. male with a diagnosis of a stage IV (2B, N3, M1 C) non-small cell lung cancer, adenocarcinoma. He presented with left upper lobe lung mass in addition to mediastinal and left supraclavicular lymphadenopathy as well as bilateral pulmonary nodules, metastatic disease to T10 vertebral body, and a suspicious mass near the pancreatic tail when he was diagnosed in March 2020. Mr. [name] is managed Dr. Fanny Bien. [name] and is currently receiving maintenance treatment with Keytruda and Alimta every 3 weeks. He was last treated on 10/07/2019. Mr. [name] contacted Dr. Worthy Flank nurse yesterday reporting that both legs from his feet to calf were burning. The right was worse than the left. They were painful to touch and his feet were puffy on the bottom.  He was recently seen in the emergency room.  He was treated for cellulitis of his right lower extremity.  His daughter reports that it did not improve and he  returned to the emergency room and was treated for gout.  Despite this, he continues to have symptoms.  He denies shortness of breath or chest discomfort.  Medications: I have reviewed the patient's current medications.  Allergies: No Known Allergies  Past Medical History:  Diagnosis Date   Bronchitis, chronic (HCC)    Hypertension    metastatic lung ca dx'd 02/2019   lyphadenopathy, bil  pulmonary noduels; suspected bone lesions    No past surgical history on file.  No family history on file.  Social History   Socioeconomic History   Marital status: Single    Spouse name: Not on file   Number of children: Not on file   Years of education: Not on file   Highest education level: Not on file  Occupational History   Not on file  Social Needs   Financial resource strain: Not on file   Food insecurity    Worry: Not on file    Inability: Not on file   Transportation needs    Medical: Not on file    Non-medical: Not on file  Tobacco Use   Smoking status: Former Smoker    Types: Cigarettes   Smokeless tobacco: Never Used  Substance and Sexual Activity   Alcohol use: Yes   Drug use: No   Sexual activity: Not on file  Lifestyle   Physical activity    Days per week: Not on file    Minutes per session: Not on file   Stress: Not on file  Relationships   Social connections    Talks on phone: Not on file    Gets together: Not on file    Attends religious service: Not on file    Active member of club or organization: Not on file    Attends meetings of clubs or organizations: Not on file    Relationship status: Not on file   Intimate partner violence    Fear of current or ex partner: Not on file    Emotionally abused: Not on file    Physically abused: Not on file    Forced sexual activity: Not on file  Other Topics Concern   Not on file  Social History Narrative   Not on file    Past Medical History, Surgical history, Social history, and Family history were reviewed and updated as appropriate.   Please see review of systems for further details on the patient's review from today.   Review of Systems:  Review of Systems  Constitutional: Negative for chills, diaphoresis and fever.  HENT: Negative for trouble swallowing and voice change.   Respiratory: Negative for cough, chest tightness, shortness of breath and wheezing.     Cardiovascular: Negative for chest pain and palpitations.  Gastrointestinal: Negative for abdominal pain, constipation, diarrhea, nausea and vomiting.  Musculoskeletal: Negative for back pain and myalgias.       Both legs from his feet to calf are burning. The right is worse than the left. They are painful to touch and his feet are puffy on the bottom.    Skin:       Both legs from his feet to calf are burning. The right is worse than the left. They are painful to touch and his feet are puffy on the bottom.    Neurological: Negative for dizziness, light-headedness and headaches.       Both legs from his feet to calf are burning. The right is worse than the left. They are painful to touch  and his feet are puffy on the bottom.      Objective:   Physical Exam:  BP (!) 141/101 (BP Location: Left Arm, Patient Position: Sitting) Comment: nurse aware   Pulse (!) 104    Temp 98 F (36.7 C) (Temporal)    Resp 18    Ht 5\' 9"  (1.753 m)    Wt 250 lb 11.2 oz (113.7 kg)    SpO2 100%    BMI 37.02 kg/m  ECOG: 1  Physical Exam Constitutional:      General: He is not in acute distress.    Appearance: Normal appearance. He is not ill-appearing, toxic-appearing or diaphoretic.  HENT:     Head: Normocephalic and atraumatic.  Skin:    General: Skin is warm and dry.  Neurological:     Mental Status: He is alert.     Gait: Gait normal.  Psychiatric:        Mood and Affect: Mood normal.        Behavior: Behavior normal.        Thought Content: Thought content normal.        Judgment: Judgment normal.    Right calf measures 42 cm at 13 cm distal to tibial tuberosity.  There is an area of fullness in the posterior right calf.  The right lower extremity is edematous with 1+ pitting edema to the knees.  The right lower extremity appears mildly erythematous without increased warmth. Left calf measures 39 cm at 13 cm distal to the tibial tuberosity.  There is trace pitting edema and the left lower  extremity to the knees.  Lab Review:     Component Value Date/Time   NA 136 10/20/2019 0930   K 3.8 10/20/2019 0930   CL 98 10/20/2019 0930   CO2 25 10/20/2019 0930   GLUCOSE 96 10/20/2019 0930   BUN 11 10/20/2019 0930   CREATININE 0.77 10/20/2019 0930   CALCIUM 9.7 10/20/2019 0930   PROT 8.7 (H) 10/20/2019 0930   ALBUMIN 4.1 10/20/2019 0930   AST 31 10/20/2019 0930   ALT 32 10/20/2019 0930   ALKPHOS 129 (H) 10/20/2019 0930   BILITOT 0.4 10/20/2019 0930   GFRNONAA >60 10/20/2019 0930   GFRAA >60 10/20/2019 0930       Component Value Date/Time   WBC 8.6 10/20/2019 0930   WBC 11.0 (H) 09/21/2019 1144   RBC 3.75 (L) 10/20/2019 0930   HGB 11.9 (L) 10/20/2019 0930   HCT 36.7 (L) 10/20/2019 0930   PLT 262 10/20/2019 0930   MCV 97.9 10/20/2019 0930   MCH 31.7 10/20/2019 0930   MCHC 32.4 10/20/2019 0930   RDW 13.6 10/20/2019 0930   LYMPHSABS 3.4 10/20/2019 0930   MONOABS 1.3 (H) 10/20/2019 0930   EOSABS 0.3 10/20/2019 0930   BASOSABS 0.0 10/20/2019 0930   -------------------------------  Imaging from last 24 hours (if applicable):  Radiology interpretation: Ct Chest W Contrast  Result Date: 09/21/2019 CLINICAL DATA:  Lung cancer restaging EXAM: CT CHEST, ABDOMEN, AND PELVIS WITH CONTRAST TECHNIQUE: Multidetector CT imaging of the chest, abdomen and pelvis was performed following the standard protocol during bolus administration of intravenous contrast. CONTRAST:  115mL OMNIPAQUE IOHEXOL 300 MG/ML SOLN, additional oral enteric contrast COMPARISON:  CT chest abdomen pelvis, 07/12/2019, CT chest, 05/11/2019, CT abdomen pelvis, 03/31/2019, CT chest, 02/14/2019 FINDINGS: CT CHEST FINDINGS Cardiovascular: Aortic atherosclerosis. Normal heart size. Scattered coronary artery calcifications. No pericardial effusion. Mediastinum/Nodes: No change in soft tissue thickening about the left hilum and AP  window. No discretely enlarged mediastinal lymph nodes. No change in enlarged left  supraclavicular lymph nodes, the largest measuring 1.5 x 0.9 cm (series 2, image 10). Thyroid gland, trachea, and esophagus demonstrate no significant findings. Lungs/Pleura: Redemonstrated trace left pleural effusion. Interval improvement in left upper lobe nodularity, with near resolution of multiple previously seen nodules, with persistent irregular opacity and architectural distortion throughout the left upper lobe. Largest residual nodule of the lateral left upper lobe measures 7 mm, previously 1.0 cm (series 7, image 55). No change in a subtle 9 mm ground-glass nodule of the posterior right upper lobe (series 7, image 54). Musculoskeletal: No chest wall mass. CT ABDOMEN PELVIS FINDINGS Hepatobiliary: No solid liver abnormality is seen. No gallstones, gallbladder wall thickening, or biliary dilatation. Pancreas: A previously seen hypodense lesion of the pancreatic tail is resolved, with persistent mild parenchymal atrophy of the pancreatic tail (series 2, image 62). No pancreatic ductal dilatation or surrounding inflammatory changes. Spleen: Normal in size without significant abnormality. Adrenals/Urinary Tract: Adrenal glands are unremarkable. New, mild perirenal fat stranding (series 2, image 69). Kidneys are otherwise normal, without renal calculi, solid lesion, or hydronephrosis. Bladder is unremarkable. Stomach/Bowel: Stomach is within normal limits. Appendix appears normal. No evidence of bowel wall thickening, distention, or inflammatory changes. Vascular/Lymphatic: Aortic atherosclerosis. No enlarged abdominal or pelvic lymph nodes. Reproductive: No mass or other abnormality. Other: No abdominal wall hernia or abnormality. No abdominopelvic ascites. Musculoskeletal: Unchanged sclerotic lesion of the T10 vertebral body (series 6, image 107). Unchanged superior endplate deformity with adjacent sclerosis of the right aspect of L5 (series 6, image 100). Unchanged sclerotic lesion of the right femoral neck  (series 5, image 107). IMPRESSION: 1. Interval improvement in left upper lobe nodularity, with near resolution of multiple previously seen nodules, with persistent irregular opacity and architectural distortion throughout the left upper lobe. Largest residual nodule of the lateral left upper lobe measures 7 mm, previously 1.0 cm (series 7, image 55). Findings are generally consistent with treatment response and developing post treatment/post radiation change. 2. No change in soft tissue thickening about the left hilum and AP window. No discretely enlarged mediastinal lymph nodes. No change in enlarged left supraclavicular lymph nodes, the largest measuring 1.5 x 0.9 cm (series 2, image 10). 3. No change in a subtle 9 mm ground-glass nodule of the posterior right upper lobe (series 7, image 54), nonspecific although favor incidental infection or inflammation. Attention on follow-up. 4. A previously seen hypodense lesion of the pancreatic tail is resolved, with persistent mild parenchymal atrophy of the pancreatic tail (series 2, image 62). Findings again consistent with treatment response. 5. No change in sclerotic osseous metastatic disease of T10 and L5. Lesion of the right femoral neck is likely an incidental enchondroma or other benign lesion. Attention on follow-up. 6. New, mild perirenal fat stranding (series 2, image 69), infectious or inflammatory, differential considerations including drug toxicity. Correlate with urinalysis. 7.  Aortic Atherosclerosis (ICD10-I70.0). Electronically Signed   By: Eddie Candle M.D.   On: 09/21/2019 12:27   Ct Abdomen Pelvis W Contrast  Result Date: 09/21/2019 CLINICAL DATA:  Lung cancer restaging EXAM: CT CHEST, ABDOMEN, AND PELVIS WITH CONTRAST TECHNIQUE: Multidetector CT imaging of the chest, abdomen and pelvis was performed following the standard protocol during bolus administration of intravenous contrast. CONTRAST:  164mL OMNIPAQUE IOHEXOL 300 MG/ML SOLN, additional  oral enteric contrast COMPARISON:  CT chest abdomen pelvis, 07/12/2019, CT chest, 05/11/2019, CT abdomen pelvis, 03/31/2019, CT chest, 02/14/2019 FINDINGS: CT CHEST FINDINGS  Cardiovascular: Aortic atherosclerosis. Normal heart size. Scattered coronary artery calcifications. No pericardial effusion. Mediastinum/Nodes: No change in soft tissue thickening about the left hilum and AP window. No discretely enlarged mediastinal lymph nodes. No change in enlarged left supraclavicular lymph nodes, the largest measuring 1.5 x 0.9 cm (series 2, image 10). Thyroid gland, trachea, and esophagus demonstrate no significant findings. Lungs/Pleura: Redemonstrated trace left pleural effusion. Interval improvement in left upper lobe nodularity, with near resolution of multiple previously seen nodules, with persistent irregular opacity and architectural distortion throughout the left upper lobe. Largest residual nodule of the lateral left upper lobe measures 7 mm, previously 1.0 cm (series 7, image 55). No change in a subtle 9 mm ground-glass nodule of the posterior right upper lobe (series 7, image 54). Musculoskeletal: No chest wall mass. CT ABDOMEN PELVIS FINDINGS Hepatobiliary: No solid liver abnormality is seen. No gallstones, gallbladder wall thickening, or biliary dilatation. Pancreas: A previously seen hypodense lesion of the pancreatic tail is resolved, with persistent mild parenchymal atrophy of the pancreatic tail (series 2, image 62). No pancreatic ductal dilatation or surrounding inflammatory changes. Spleen: Normal in size without significant abnormality. Adrenals/Urinary Tract: Adrenal glands are unremarkable. New, mild perirenal fat stranding (series 2, image 69). Kidneys are otherwise normal, without renal calculi, solid lesion, or hydronephrosis. Bladder is unremarkable. Stomach/Bowel: Stomach is within normal limits. Appendix appears normal. No evidence of bowel wall thickening, distention, or inflammatory changes.  Vascular/Lymphatic: Aortic atherosclerosis. No enlarged abdominal or pelvic lymph nodes. Reproductive: No mass or other abnormality. Other: No abdominal wall hernia or abnormality. No abdominopelvic ascites. Musculoskeletal: Unchanged sclerotic lesion of the T10 vertebral body (series 6, image 107). Unchanged superior endplate deformity with adjacent sclerosis of the right aspect of L5 (series 6, image 100). Unchanged sclerotic lesion of the right femoral neck (series 5, image 107). IMPRESSION: 1. Interval improvement in left upper lobe nodularity, with near resolution of multiple previously seen nodules, with persistent irregular opacity and architectural distortion throughout the left upper lobe. Largest residual nodule of the lateral left upper lobe measures 7 mm, previously 1.0 cm (series 7, image 55). Findings are generally consistent with treatment response and developing post treatment/post radiation change. 2. No change in soft tissue thickening about the left hilum and AP window. No discretely enlarged mediastinal lymph nodes. No change in enlarged left supraclavicular lymph nodes, the largest measuring 1.5 x 0.9 cm (series 2, image 10). 3. No change in a subtle 9 mm ground-glass nodule of the posterior right upper lobe (series 7, image 54), nonspecific although favor incidental infection or inflammation. Attention on follow-up. 4. A previously seen hypodense lesion of the pancreatic tail is resolved, with persistent mild parenchymal atrophy of the pancreatic tail (series 2, image 62). Findings again consistent with treatment response. 5. No change in sclerotic osseous metastatic disease of T10 and L5. Lesion of the right femoral neck is likely an incidental enchondroma or other benign lesion. Attention on follow-up. 6. New, mild perirenal fat stranding (series 2, image 69), infectious or inflammatory, differential considerations including drug toxicity. Correlate with urinalysis. 7.  Aortic Atherosclerosis  (ICD10-I70.0). Electronically Signed   By: Eddie Candle M.D.   On: 09/21/2019 12:27   Mr Shoulder Right Wo Contrast  Result Date: 09/22/2019 CLINICAL DATA:  Right shoulder and right arm pain. EXAM: MRI OF THE RIGHT SHOULDER WITHOUT CONTRAST TECHNIQUE: Multiplanar, multisequence MR imaging of the shoulder was performed. No intravenous contrast was administered. COMPARISON:  Radiographs from 09/03/2019 FINDINGS: Rotator cuff: Mild to moderate distal  supraspinatus tendinopathy. Mild infraspinatus tendinopathy. Muscles:  Unremarkable Biceps long head:  Unremarkable Acromioclavicular Joint: Moderate spurring and mild to moderate subcortical marrow edema. Type II acromion. No significant bursitis. Glenohumeral Joint: Mild degenerative glenohumeral chondral thinning. Minimal spurring of the humeral head. Mild degenerative subcortical cyst formation posteriorly along the humeral head. Labrum:  Grossly unremarkable Bones: No significant extra-articular osseous abnormalities identified. Other: No supplemental non-categorized findings. IMPRESSION: 1. Mild to moderate distal supraspinatus tendinopathy and mild infraspinatus tendinopathy. 2. Moderate degenerative AC joint arthropathy and mild degenerative glenohumeral arthropathy. Electronically Signed   By: Tekila Caillouet Clines M.D.   On: 09/22/2019 10:36   Dg Chest Portable 1 View  Result Date: 09/21/2019 CLINICAL DATA:  Nodular densities and left upper lobe opacity seen on the previous study have improved. Linear areas extended to the left upper chest, small nodules and other findings in this area better seen on chest CT acquired on 09/21/2019. No signs of dense consolidation. Tiny left effusion not seen on the current exam. Right lung is clear. Cardiomediastinal contours with stable mild cardiac enlargement. EXAM: PORTABLE CHEST 1 VIEW COMPARISON:  Chest CT of the same date and chest CT from July 12, 2019 FINDINGS: The heart size and mediastinal contours are within  normal limits. Both lungs are clear. The visualized skeletal structures are unremarkable. IMPRESSION: Improved appearance of the chest based on comparison with prior chest CTs, still with some areas of linear year opacity in smaller nodular densities in the left upper lobe, refer to chest CT acquired the same date. Electronically Signed   By: Zetta Bills M.D.   On: 09/21/2019 12:29   Vas [place] Lower Extremity Venous (dvt)  Result Date: 10/20/2019  Lower Venous Study Indications: Swelling, and Pain.  Comparison Study: previous study done 09/21/19 Performing Technologist: Abram Sander RVS  Examination Guidelines: A complete evaluation includes B-mode imaging, spectral Doppler, color Doppler, and power Doppler as needed of all accessible portions of each vessel. Bilateral testing is considered an integral part of a complete examination. Limited examinations for reoccurring indications may be performed as noted.  +---------+---------------+---------+-----------+----------+--------------+  RIGHT     Compressibility Phasicity Spontaneity Properties Thrombus Aging  +---------+---------------+---------+-----------+----------+--------------+  CFV       Full            Yes       Yes                                    +---------+---------------+---------+-----------+----------+--------------+  SFJ       Full                                                             +---------+---------------+---------+-----------+----------+--------------+  FV Prox   Full                                                             +---------+---------------+---------+-----------+----------+--------------+  FV Mid    Full                                                             +---------+---------------+---------+-----------+----------+--------------+  FV Distal Full                                                             +---------+---------------+---------+-----------+----------+--------------+  PFV       Full                                                              +---------+---------------+---------+-----------+----------+--------------+  POP       Full            Yes       Yes                                    +---------+---------------+---------+-----------+----------+--------------+  PTV       Full                                                             +---------+---------------+---------+-----------+----------+--------------+  PERO                                                       Not visualized  +---------+---------------+---------+-----------+----------+--------------+   +----+---------------+---------+-----------+----------+--------------+  LEFT Compressibility Phasicity Spontaneity Properties Thrombus Aging  +----+---------------+---------+-----------+----------+--------------+  CFV  Full            Yes       Yes                                    +----+---------------+---------+-----------+----------+--------------+     Summary: Right: There is no evidence of deep vein thrombosis in the lower extremity. No cystic structure found in the popliteal fossa. Left: No evidence of common femoral vein obstruction.  *See table(s) above for measurements and observations.    Preliminary    Vas [place] Lower Extremity Venous (dvt) (only Mc & Wl 7a-7p)  Result Date: 09/21/2019  Lower Venous Study Indications: Pain, and Swelling.  Limitations: Pain. Comparison Study: No prior study. Performing Technologist: Maudry Mayhew MHA, RDMS, RVT, RDCS  Examination Guidelines: A complete evaluation includes B-mode imaging, spectral Doppler, color Doppler, and power Doppler as needed of all accessible portions of each vessel. Bilateral testing is considered an integral part of a complete examination. Limited examinations for reoccurring indications may be performed as noted.  +---------+---------------+---------+-----------+----------+--------------+  RIGHT     Compressibility Phasicity Spontaneity Properties Thrombus Aging   +---------+---------------+---------+-----------+----------+--------------+  CFV       Full            Yes       Yes                                    +---------+---------------+---------+-----------+----------+--------------+  SFJ       Full                                                             +---------+---------------+---------+-----------+----------+--------------+  FV Prox   Full                                                             +---------+---------------+---------+-----------+----------+--------------+  FV Mid    Full                                                             +---------+---------------+---------+-----------+----------+--------------+  FV Distal Full                                                             +---------+---------------+---------+-----------+----------+--------------+  PFV       Full                                                             +---------+---------------+---------+-----------+----------+--------------+  POP       Full            Yes       Yes                                    +---------+---------------+---------+-----------+----------+--------------+  PTV       Full                                                             +---------+---------------+---------+-----------+----------+--------------+  PERO      Full                                                             +---------+---------------+---------+-----------+----------+--------------+  Left CFV not visualized.  Summary: Right: There is no evidence of deep vein thrombosis in the lower extremity. No cystic structure found in the popliteal fossa.  *See table(s) above for measurements and observations. Electronically signed by Deitra Mayo MD on 09/21/2019 at 4:29:26 PM.    Final  This case was discussed with Dr. Julien Nordmann. He expressed agreement with my management of this patient.

## 2019-10-20 NOTE — Telephone Encounter (Signed)
DME ORDER faxed-wheelchair and rolling walker

## 2019-10-25 ENCOUNTER — Ambulatory Visit (INDEPENDENT_AMBULATORY_CARE_PROVIDER_SITE_OTHER): Payer: Medicaid Other | Admitting: Orthopedic Surgery

## 2019-10-25 ENCOUNTER — Encounter: Payer: Self-pay | Admitting: Orthopedic Surgery

## 2019-10-25 ENCOUNTER — Other Ambulatory Visit: Payer: Self-pay

## 2019-10-25 DIAGNOSIS — M19011 Primary osteoarthritis, right shoulder: Secondary | ICD-10-CM

## 2019-10-25 NOTE — Telephone Encounter (Signed)
Refill request

## 2019-10-27 ENCOUNTER — Encounter: Payer: Self-pay | Admitting: Orthopedic Surgery

## 2019-10-27 DIAGNOSIS — M19011 Primary osteoarthritis, right shoulder: Secondary | ICD-10-CM

## 2019-10-27 MED ORDER — BUPIVACAINE HCL 0.25 % IJ SOLN
0.6600 mL | INTRAMUSCULAR | Status: AC | PRN
  Administered 2019-10-27: .66 mL via INTRA_ARTICULAR

## 2019-10-27 MED ORDER — LIDOCAINE HCL 1 % IJ SOLN
3.0000 mL | INTRAMUSCULAR | Status: AC | PRN
  Administered 2019-10-27: 3 mL

## 2019-10-27 MED ORDER — METHYLPREDNISOLONE ACETATE 40 MG/ML IJ SUSP
13.3300 mg | INTRAMUSCULAR | Status: AC | PRN
  Administered 2019-10-27: 13.33 mg via INTRA_ARTICULAR

## 2019-10-27 NOTE — Progress Notes (Addendum)
Office Visit Note   Patient: Willie Schmidt           Date of Birth: 01-30-1961           MRN: [id number] Visit Date: 10/25/2019 Requested by: Gracelyn Nurse, PA-C [place],  [place] [id number] PCP: [name], MD  Subjective: No chief complaint on file.   HPI: [name] is a patient with right shoulder and neck pain.  Localizes the pain primarily to the superior aspect of his shoulder.  He is left-hand dominant.  Reports generally constant pain in that right arm with some radiation into the shoulder but not below the elbow.  Denies any numbness and tingling.  He was tested for gout and that was negative.  He does have a diagnosis of lung cancer and is currently receiving treatment for that.  He is tried anti-inflammatories without much relief.  No prior surgery on that right arm and shoulder.  MRI scan shows moderate degenerative AC joint arthropathy with some supraspinatus tendinopathy also.              ROS: All systems reviewed are negative as they relate to the chief complaint within the history of present illness.  Patient denies  fevers or chills.   Assessment & Plan: Visit Diagnoses:  1. Arthritis of right acromioclavicular joint     Plan: Impression is right shoulder AC joint pain in a patient who is being treated for stage IV lung cancer.  His exam is consistent with AC joint arthritis.  I think it would be worth ultrasound-guided injection into the [facility] joint today.  During the anesthetic portion of that injection he did get pretty good relief from his shoulder pain.  We will see him back in 6 weeks and consider another injection which we would do prior to any type of arthroscopic distal clavicle excision.  Follow-up in 6 weeks for repeat clinical assessment and possible second injection into the [facility] joint.  Follow-Up Instructions: Return in about 6 weeks (around 12/06/2019).   Orders:  No orders of the defined types were placed in this encounter.  No orders  of the defined types were placed in this encounter.     Procedures: Medium Joint Inj: R acromioclavicular on 10/27/2019 7:47 AM Indications: diagnostic evaluation and pain Details: 25 G 1.5 in needle, ultrasound-guided superior approach Medications: 3 mL lidocaine 1 %; 0.66 mL bupivacaine 0.25 %; 13.33 mg methylPREDNISolone acetate 40 MG/ML Outcome: tolerated well, no immediate complications Procedure, treatment alternatives, risks and benefits explained, specific risks discussed. Consent was given by the patient. Immediately prior to procedure a time out was called to verify the correct patient, procedure, equipment, support staff and site/side marked as required. Patient was prepped and draped in the usual sterile fashion.       Clinical Data: No additional findings.  Objective: Vital Signs: There were no vitals taken for this visit.  Physical Exam:   Constitutional: Patient appears well-developed HEENT:  Head: Normocephalic Eyes:EOM are normal Neck: Normal range of motion Cardiovascular: Normal rate Pulmonary/chest: Effort normal Neurologic: Patient is alert Skin: Skin is warm Psychiatric: Patient has normal mood and affect    Ortho Exam: Ortho exam demonstrates good cervical spine range of motion.  Patient has 5 out of 5 grip EPL FPL interosseous wrist flexion extension bicep triceps and deltoid strength.  He has palpable radial pulses.  He has tenderness to palpation in the right AC joint compared to the left.  Also has pain  with crossarm adduction.  No restriction of passive range of motion on the right-hand side.  No other masses lymphadenopathy or skin changes noted in that shoulder girdle region.  Rotator cuff strength is good infraspinatus supraspinatus and subscap muscle testing.  Specialty Comments:  No specialty comments available.  Imaging: No results found.   PMFS History: Patient Active Problem List   Diagnosis Date Noted  . Right shoulder pain  04/22/2019  . Adenocarcinoma of left lung, stage 4 ([place]) 03/03/2019  . Encounter for antineoplastic chemotherapy 03/03/2019  . Encounter for antineoplastic immunotherapy 03/03/2019  . Goals of care, counseling/discussion 03/03/2019  . Neck pain on right side 03/03/2019  . Neck pain 02/17/2019   Past Medical History:  Diagnosis Date  . Bronchitis, chronic ([place])   . Hypertension   . metastatic lung ca dx'd 02/2019   lyphadenopathy, bil pulmonary noduels; suspected bone lesions    History reviewed. No pertinent family history.  History reviewed. No pertinent surgical history. Social History   Occupational History  . Not on file  Tobacco Use  . Smoking status: Former Smoker    Types: Cigarettes  . Smokeless tobacco: Never Used  Substance and Sexual Activity  . Alcohol use: Yes  . Drug use: No  . Sexual activity: Not on file

## 2019-10-28 ENCOUNTER — Inpatient Hospital Stay: Payer: Medicaid Other

## 2019-10-28 ENCOUNTER — Inpatient Hospital Stay (HOSPITAL_BASED_OUTPATIENT_CLINIC_OR_DEPARTMENT_OTHER): Payer: Medicaid Other | Admitting: Internal Medicine

## 2019-10-28 ENCOUNTER — Other Ambulatory Visit: Payer: Self-pay

## 2019-10-28 ENCOUNTER — Encounter: Payer: Self-pay | Admitting: Internal Medicine

## 2019-10-28 ENCOUNTER — Inpatient Hospital Stay: Payer: Medicaid Other | Attending: Internal Medicine

## 2019-10-28 VITALS — HR 98

## 2019-10-28 VITALS — BP 140/98 | HR 102 | Temp 98.0°F | Resp 17 | Ht 69.0 in | Wt 252.2 lb

## 2019-10-28 DIAGNOSIS — C3492 Malignant neoplasm of unspecified part of left bronchus or lung: Secondary | ICD-10-CM | POA: Diagnosis not present

## 2019-10-28 DIAGNOSIS — M542 Cervicalgia: Secondary | ICD-10-CM | POA: Diagnosis not present

## 2019-10-28 DIAGNOSIS — C3412 Malignant neoplasm of upper lobe, left bronchus or lung: Secondary | ICD-10-CM | POA: Insufficient documentation

## 2019-10-28 DIAGNOSIS — Z79899 Other long term (current) drug therapy: Secondary | ICD-10-CM | POA: Insufficient documentation

## 2019-10-28 DIAGNOSIS — Z5112 Encounter for antineoplastic immunotherapy: Secondary | ICD-10-CM | POA: Diagnosis not present

## 2019-10-28 DIAGNOSIS — Z5111 Encounter for antineoplastic chemotherapy: Secondary | ICD-10-CM | POA: Insufficient documentation

## 2019-10-28 DIAGNOSIS — C3490 Malignant neoplasm of unspecified part of unspecified bronchus or lung: Secondary | ICD-10-CM

## 2019-10-28 DIAGNOSIS — M25511 Pain in right shoulder: Secondary | ICD-10-CM | POA: Insufficient documentation

## 2019-10-28 LAB — CMP (CANCER CENTER ONLY)
ALT: 29 U/L (ref 0–44)
AST: 30 U/L (ref 15–41)
Albumin: 4.3 g/dL (ref 3.5–5.0)
Alkaline Phosphatase: 109 U/L (ref 38–126)
Anion gap: 14 (ref 5–15)
BUN: 13 mg/dL (ref 6–20)
CO2: 24 mmol/L (ref 22–32)
Calcium: 9.7 mg/dL (ref 8.9–10.3)
Chloride: 96 mmol/L — ABNORMAL LOW (ref 98–111)
Creatinine: 1.03 mg/dL (ref 0.61–1.24)
GFR, Est AFR Am: >60 mL/min (ref >60)
GFR, Estimated: >60 mL/min (ref >60)
Glucose, Bld: 98 mg/dL (ref 70–99)
Potassium: 3.9 mmol/L (ref 3.5–5.1)
Sodium: 134 mmol/L — ABNORMAL LOW (ref 135–145)
Total Bilirubin: 0.5 mg/dL (ref 0.3–1.2)
Total Protein: 8.7 g/dL — ABNORMAL HIGH (ref 6.5–8.1)

## 2019-10-28 LAB — CBC WITH DIFFERENTIAL (CANCER CENTER ONLY)
Abs Immature Granulocytes: 0.03 10*3/uL (ref 0.00–0.07)
Basophils Absolute: 0 10*3/uL (ref 0.0–0.1)
Basophils Relative: 0 %
Eosinophils Absolute: 0.3 10*3/uL (ref 0.0–0.5)
Eosinophils Relative: 3 %
HCT: 37.4 % — ABNORMAL LOW (ref 39.0–52.0)
Hemoglobin: 12 g/dL — ABNORMAL LOW (ref 13.0–17.0)
Immature Granulocytes: 0 %
Lymphocytes Relative: 32 %
Lymphs Abs: 3.5 10*3/uL (ref 0.7–4.0)
MCH: 31.6 pg (ref 26.0–34.0)
MCHC: 32.1 g/dL (ref 30.0–36.0)
MCV: 98.4 fL (ref 80.0–100.0)
Monocytes Absolute: 1.1 10*3/uL — ABNORMAL HIGH (ref 0.1–1.0)
Monocytes Relative: 10 %
Neutro Abs: 6 10*3/uL (ref 1.7–7.7)
Neutrophils Relative %: 55 %
Platelet Count: 320 10*3/uL (ref 150–400)
RBC: 3.8 MIL/uL — ABNORMAL LOW (ref 4.22–5.81)
RDW: 13.3 % (ref 11.5–15.5)
WBC Count: 11 10*3/uL — ABNORMAL HIGH (ref 4.0–10.5)
nRBC: 0 % (ref 0.0–0.2)

## 2019-10-28 LAB — TSH: TSH: 0.714 u[IU]/mL (ref 0.320–4.118)

## 2019-10-28 MED ORDER — PROCHLORPERAZINE MALEATE 10 MG PO TABS
ORAL_TABLET | ORAL | Status: AC
  Filled 2019-10-28: qty 1

## 2019-10-28 MED ORDER — SODIUM CHLORIDE 0.9 % IV SOLN
Freq: Once | INTRAVENOUS | Status: AC
  Administered 2019-10-28: 11:00:00 via INTRAVENOUS
  Filled 2019-10-28: qty 250

## 2019-10-28 MED ORDER — OXYCODONE-ACETAMINOPHEN 5-325 MG PO TABS: 1.0000 | ORAL_TABLET | Freq: Three times a day (TID) | ORAL | 0 refills | Status: DC | PRN

## 2019-10-28 MED ORDER — SODIUM CHLORIDE 0.9 % IV SOLN
500.0000 mg/m2 | Freq: Once | INTRAVENOUS | Status: AC
  Administered 2019-10-28: 1100 mg via INTRAVENOUS
  Filled 2019-10-28: qty 40

## 2019-10-28 MED ORDER — SODIUM CHLORIDE 0.9 % IV SOLN
200.0000 mg | Freq: Once | INTRAVENOUS | Status: AC
  Administered 2019-10-28: 200 mg via INTRAVENOUS
  Filled 2019-10-28: qty 8

## 2019-10-28 MED ORDER — PROCHLORPERAZINE MALEATE 10 MG PO TABS
10.0000 mg | ORAL_TABLET | Freq: Once | ORAL | Status: AC
  Administered 2019-10-28: 10 mg via ORAL

## 2019-10-28 NOTE — Patient Instructions (Signed)
[facility] Discharge Instructions for Patients Receiving Chemotherapy  Today you received the following chemotherapy agents Keytruda and Alimta  To help prevent nausea and vomiting after your treatment, we encourage you to take your nausea medication as directed.    If you develop nausea and vomiting that is not controlled by your nausea medication, call the clinic.   BELOW ARE SYMPTOMS THAT SHOULD BE REPORTED IMMEDIATELY:  *FEVER GREATER THAN 100.5 F  *CHILLS WITH OR WITHOUT FEVER  NAUSEA AND VOMITING THAT IS NOT CONTROLLED WITH YOUR NAUSEA MEDICATION  *UNUSUAL SHORTNESS OF BREATH  *UNUSUAL BRUISING OR BLEEDING  TENDERNESS IN MOUTH AND THROAT WITH OR WITHOUT PRESENCE OF ULCERS  *URINARY PROBLEMS  *BOWEL PROBLEMS  UNUSUAL RASH Items with * indicate a potential emergency and should be followed up as soon as possible.  Feel free to call the clinic should you have any questions or concerns. The clinic phone number is [phone].  Please show the [place] at check-in to the Emergency Department and triage nurse.

## 2019-10-28 NOTE — Progress Notes (Signed)
[facility] [phone]   [phone]  OFFICE PROGRESS NOTE  Willie Schmidt, [address] [place] [address]  DIAGNOSIS: Stage IV (T2b, N3, M1 C) non-small cell lung cancer, adenocarcinoma presented with left upper lobe lung mass in addition to mediastinal and left supraclavicular lymphadenopathy as well as bilateral pulmonary nodules and suspicious bone lesion diagnosed in March 2020.  PRIOR THERAPY: None  CURRENT THERAPY: Systemic chemotherapy with carboplatin for AUC of 5, Alimta 500 mg/M2 and Keytruda 200 mg IV every 3 weeks.  First dose March 11, 2019.  Status post 11 cycles.  Starting from cycle #5 the patient is on maintenance treatment with Alimta and Keytruda every 3 weeks.  INTERVAL HISTORY: [name] 58 y.o. male returns to the clinic today for follow-up visit.  The patient is feeling fine today with no concerning complaints except for the intermittent right shoulder pain.  He was seen by orthopedic surgery and received steroid injection to the shoulder recently.  He denied having any current chest pain, shortness of breath except with exertion with no cough or hemoptysis.  He denied having any fever or chills.  He has no nausea, vomiting, diarrhea or constipation.  He denied having any headache or visual changes.  Is here today for evaluation before starting cycle #12.   MEDICAL HISTORY: Past Medical History:  Diagnosis Date   Bronchitis, chronic ([place])    Hypertension    metastatic lung ca dx'd 02/2019   lyphadenopathy, bil pulmonary noduels; suspected bone lesions    ALLERGIES:  has No Known Allergies.  MEDICATIONS:  Current Outpatient Medications  Medication Sig Dispense Refill   albuterol (PROVENTIL HFA;VENTOLIN HFA) 108 (90 BASE) MCG/ACT inhaler Inhale 2 puffs into the lungs every 6 (six) hours as needed for wheezing. 1 Inhaler 2   amLODipine (NORVASC) 5 MG tablet TK 1 T PO Q NIGHT     aspirin 81 MG tablet  Take 81 mg by mouth daily.     cyclobenzaprine (FLEXERIL) 10 MG tablet TAKE 1 TABLET BY MOUTH THREE TIMES DAILY AS NEEDED FOR MUSCLE SPASMS 30 tablet 0   DULoxetine (CYMBALTA) 30 MG capsule 1 ONCE DAILY FOR 1 WEEK THEN INCREASE TO 1 TWICE DAILY 60 capsule 3   folic acid (FOLVITE) 1 MG tablet TAKE 1 TABLET(1 MG) BY MOUTH DAILY 30 tablet 4   furosemide (LASIX) 20 MG tablet 20 mg PO AM x 3 days then stop. Repeat if edema recurs 30 tablet 1   gabapentin (NEURONTIN) 300 MG capsule Take 1 capsule (300 mg total) by mouth 3 (three) times daily. 90 capsule 3   indomethacin (INDOCIN) 50 MG capsule Take 1 capsule (50 mg total) by mouth 3 (three) times daily with meals. 12 capsule 0   lisinopril-hydrochlorothiazide (ZESTORETIC) 20-25 MG tablet Take 1 tablet by mouth daily.     meloxicam (MOBIC) 15 MG tablet TK 1 T PO QD WF     oxyCODONE-acetaminophen (PERCOCET/ROXICET) 5-325 MG tablet Take 1 tablet by mouth every 8 (eight) hours as needed for severe pain. 30 tablet 0   potassium chloride SA (KLOR-CON) 20 MEQ tablet 1 Tablet once daily while taking Lasix 30 tablet 1   prochlorperazine (COMPAZINE) 10 MG tablet TAKE 1 TABLET(10 MG) BY MOUTH EVERY 6 HOURS AS NEEDED FOR NAUSEA OR VOMITING 30 tablet 0   sulfamethoxazole-trimethoprim (BACTRIM DS) 800-160 MG tablet Take 1 tablet by mouth 2 (two) times daily. 14 tablet 0   No  current facility-administered medications for this visit.     SURGICAL HISTORY: No past surgical history on file.  REVIEW OF SYSTEMS:  A comprehensive review of systems was negative except for: Constitutional: positive for fatigue Respiratory: positive for dyspnea on exertion Musculoskeletal: positive for arthralgias   PHYSICAL EXAMINATION: General appearance: alert, cooperative, fatigued and no distress Head: Normocephalic, without obvious abnormality, atraumatic Neck: no adenopathy, no JVD, supple, symmetrical, trachea midline and thyroid not enlarged, symmetric, no  tenderness/mass/nodules Lymph nodes: Cervical, supraclavicular, and axillary nodes normal. Resp: clear to auscultation bilaterally Back: symmetric, no curvature. ROM normal. No CVA tenderness. Cardio: regular rate and rhythm, S1, S2 normal, no murmur, click, rub or gallop GI: soft, non-tender; bowel sounds normal; no masses,  no organomegaly Extremities: extremities normal, atraumatic, no cyanosis or edema  ECOG PERFORMANCE STATUS: 1 - Symptomatic but completely ambulatory  Blood pressure (!) 140/98, pulse (!) 102, temperature 98 F (36.7 C), temperature source Temporal, resp. rate 17, height 5\' 9"  (1.753 m), weight 252 lb 3.2 oz (114.4 kg), SpO2 100 %.  LABORATORY DATA: Lab Results  Component Value Date   WBC 11.0 (H) 10/28/2019   HGB 12.0 (L) 10/28/2019   HCT 37.4 (L) 10/28/2019   MCV 98.4 10/28/2019   PLT 320 10/28/2019      Chemistry      Component Value Date/Time   NA 136 10/20/2019 0930   K 3.8 10/20/2019 0930   CL 98 10/20/2019 0930   CO2 25 10/20/2019 0930   BUN 11 10/20/2019 0930   CREATININE 0.77 10/20/2019 0930      Component Value Date/Time   CALCIUM 9.7 10/20/2019 0930   ALKPHOS 129 (H) 10/20/2019 0930   AST 31 10/20/2019 0930   ALT 32 10/20/2019 0930   BILITOT 0.4 10/20/2019 0930       RADIOGRAPHIC STUDIES: Vas [place] Lower Extremity Venous (dvt)  Result Date: 10/20/2019  Lower Venous Study Indications: Swelling, and Pain.  Comparison Study: previous study done 09/21/19 Performing Technologist: Abram Sander RVS  Examination Guidelines: A complete evaluation includes B-mode imaging, spectral Doppler, color Doppler, and power Doppler as needed of all accessible portions of each vessel. Bilateral testing is considered an integral part of a complete examination. Limited examinations for reoccurring indications may be performed as noted.  +---------+---------------+---------+-----------+----------+--------------+  RIGHT      Compressibility Phasicity Spontaneity Properties Thrombus Aging  +---------+---------------+---------+-----------+----------+--------------+  CFV       Full            Yes       Yes                                    +---------+---------------+---------+-----------+----------+--------------+  SFJ       Full                                                             +---------+---------------+---------+-----------+----------+--------------+  FV Prox   Full                                                             +---------+---------------+---------+-----------+----------+--------------+  FV Mid    Full                                                             +---------+---------------+---------+-----------+----------+--------------+  FV Distal Full                                                             +---------+---------------+---------+-----------+----------+--------------+  PFV       Full                                                             +---------+---------------+---------+-----------+----------+--------------+  POP       Full            Yes       Yes                                    +---------+---------------+---------+-----------+----------+--------------+  PTV       Full                                                             +---------+---------------+---------+-----------+----------+--------------+  PERO                                                       Not visualized  +---------+---------------+---------+-----------+----------+--------------+   +----+---------------+---------+-----------+----------+--------------+  LEFT Compressibility Phasicity Spontaneity Properties Thrombus Aging  +----+---------------+---------+-----------+----------+--------------+  CFV  Full            Yes       Yes                                    +----+---------------+---------+-----------+----------+--------------+     Summary: Right: There is no evidence of deep vein thrombosis in the lower  extremity. No cystic structure found in the popliteal fossa. Left: No evidence of common femoral vein obstruction.  *See table(s) above for measurements and observations. Electronically signed by Harold Barban MD on 10/20/2019 at 1:10:06 PM.    Final     ASSESSMENT AND PLAN: This is a very pleasant 58 years old African-American male with likely stage IV non-small cell lung cancer, adenocarcinoma presented with large left upper lobe lung mass in addition to mediastinal and left supraclavicular lymphadenopathy as well as suspicious bone metastasis and bilateral pulmonary nodules diagnosed in March 2020. The patient is currently undergoing systemic chemotherapy with carboplatin, Alimta and Keytruda status post 11 cycles.  Starting  from cycle #5 he is receiving maintenance treatment with Alimta and Keytruda. The patient continues to tolerate his treatment fairly well with no concerning adverse effects. I recommended for the patient to proceed with cycle #12 today as planned. The patient will come back for follow-up visit in 3 weeks for evaluation with repeat CT scan of the chest, abdomen pelvis for restaging of his disease. The patient was advised to call immediately if he has any concerning symptoms in the interval. The patient voices understanding of current disease status and treatment options and is in agreement with the current care plan.  All questions were answered. The patient knows to call the clinic with any problems, questions or concerns. We can certainly see the patient much sooner if necessary.   Disclaimer: This note was dictated with voice recognition software. Similar sounding words can inadvertently be transcribed and may not be corrected upon review.

## 2019-11-01 ENCOUNTER — Other Ambulatory Visit: Payer: Medicaid Other | Admitting: Internal Medicine

## 2019-11-01 ENCOUNTER — Other Ambulatory Visit: Payer: Self-pay

## 2019-11-02 ENCOUNTER — Telehealth: Payer: Self-pay | Admitting: Medical Oncology

## 2019-11-02 NOTE — Telephone Encounter (Signed)
Persistent burning  sensation on top of right foot.taking gabapentin 300 tid. Per Julien Nordmann I instructed Tyde to take Gabapentin 300 mg qid.

## 2019-11-02 NOTE — Telephone Encounter (Signed)
Pt.notified

## 2019-11-03 ENCOUNTER — Telehealth: Payer: Self-pay

## 2019-11-03 NOTE — Telephone Encounter (Signed)
[name] daughter, [name] called.  Asking what scans he is having this month.  She also wanted to know if "his cancer could be in his legs" and that could be causing his current leg symptoms. I told her it was unlikely.  I encouraged her to have her father examined by his primary care physician as Dr. Julien Nordmann does not feel these symptoms are related to his treatment.  She verbalized understanding.

## 2019-11-08 ENCOUNTER — Encounter (HOSPITAL_COMMUNITY): Payer: Self-pay

## 2019-11-08 ENCOUNTER — Telehealth: Payer: Self-pay | Admitting: *Deleted

## 2019-11-08 ENCOUNTER — Other Ambulatory Visit: Payer: Self-pay

## 2019-11-08 ENCOUNTER — Emergency Department (HOSPITAL_BASED_OUTPATIENT_CLINIC_OR_DEPARTMENT_OTHER): Payer: Medicaid Other

## 2019-11-08 ENCOUNTER — Emergency Department (HOSPITAL_BASED_OUTPATIENT_CLINIC_OR_DEPARTMENT_OTHER)
Admission: EM | Admit: 2019-11-08 | Discharge: 2019-11-08 | Disposition: A | Discharge status: Home / Self Care | Payer: Medicaid Other | Source: Home / Self Care | Attending: Emergency Medicine | Admitting: Emergency Medicine

## 2019-11-08 ENCOUNTER — Encounter (HOSPITAL_BASED_OUTPATIENT_CLINIC_OR_DEPARTMENT_OTHER): Payer: Self-pay | Admitting: *Deleted

## 2019-11-08 ENCOUNTER — Emergency Department (HOSPITAL_COMMUNITY)
Admission: EM | Admit: 2019-11-08 | Discharge: 2019-11-08 | Disposition: A | Discharge status: Home / Self Care | Payer: Medicaid Other | Attending: Emergency Medicine | Admitting: Emergency Medicine

## 2019-11-08 DIAGNOSIS — I10 Essential (primary) hypertension: Secondary | ICD-10-CM | POA: Insufficient documentation

## 2019-11-08 DIAGNOSIS — Z5321 Procedure and treatment not carried out due to patient leaving prior to being seen by health care provider: Secondary | ICD-10-CM | POA: Insufficient documentation

## 2019-11-08 DIAGNOSIS — Y929 Unspecified place or not applicable: Secondary | ICD-10-CM | POA: Insufficient documentation

## 2019-11-08 DIAGNOSIS — Z87891 Personal history of nicotine dependence: Secondary | ICD-10-CM | POA: Insufficient documentation

## 2019-11-08 DIAGNOSIS — Y999 Unspecified external cause status: Secondary | ICD-10-CM | POA: Insufficient documentation

## 2019-11-08 DIAGNOSIS — S90851A Superficial foreign body, right foot, initial encounter: Secondary | ICD-10-CM | POA: Insufficient documentation

## 2019-11-08 DIAGNOSIS — R2241 Localized swelling, mass and lump, right lower limb: Secondary | ICD-10-CM | POA: Diagnosis present

## 2019-11-08 DIAGNOSIS — Y939 Activity, unspecified: Secondary | ICD-10-CM | POA: Insufficient documentation

## 2019-11-08 DIAGNOSIS — X58XXXA Exposure to other specified factors, initial encounter: Secondary | ICD-10-CM | POA: Insufficient documentation

## 2019-11-08 DIAGNOSIS — Z79899 Other long term (current) drug therapy: Secondary | ICD-10-CM | POA: Insufficient documentation

## 2019-11-08 DIAGNOSIS — C7800 Secondary malignant neoplasm of unspecified lung: Secondary | ICD-10-CM | POA: Insufficient documentation

## 2019-11-08 LAB — BASIC METABOLIC PANEL
Anion gap: 12 (ref 5–15)
BUN: 11 mg/dL (ref 6–20)
CO2: 27 mmol/L (ref 22–32)
Calcium: 9.4 mg/dL (ref 8.9–10.3)
Chloride: 91 mmol/L — ABNORMAL LOW (ref 98–111)
Creatinine, Ser: 0.91 mg/dL (ref 0.61–1.24)
GFR calc Af Amer: >60 mL/min (ref >60)
GFR calc non Af Amer: >60 mL/min (ref >60)
Glucose, Bld: 100 mg/dL — ABNORMAL HIGH (ref 70–99)
Potassium: 3.6 mmol/L (ref 3.5–5.1)
Sodium: 130 mmol/L — ABNORMAL LOW (ref 135–145)

## 2019-11-08 LAB — CBC WITH DIFFERENTIAL/PLATELET
Abs Immature Granulocytes: 0.02 10*3/uL (ref 0.00–0.07)
Basophils Absolute: 0 10*3/uL (ref 0.0–0.1)
Basophils Relative: 0 %
Eosinophils Absolute: 0.3 10*3/uL (ref 0.0–0.5)
Eosinophils Relative: 4 %
HCT: 37.1 % — ABNORMAL LOW (ref 39.0–52.0)
Hemoglobin: 11.7 g/dL — ABNORMAL LOW (ref 13.0–17.0)
Immature Granulocytes: 0 %
Lymphocytes Relative: 45 %
Lymphs Abs: 2.9 10*3/uL (ref 0.7–4.0)
MCH: 31.6 pg (ref 26.0–34.0)
MCHC: 31.5 g/dL (ref 30.0–36.0)
MCV: 100.3 fL — ABNORMAL HIGH (ref 80.0–100.0)
Monocytes Absolute: 1.2 10*3/uL — ABNORMAL HIGH (ref 0.1–1.0)
Monocytes Relative: 19 %
Neutro Abs: 2.1 10*3/uL (ref 1.7–7.7)
Neutrophils Relative %: 32 %
Platelets: 280 10*3/uL (ref 150–400)
RBC: 3.7 MIL/uL — ABNORMAL LOW (ref 4.22–5.81)
RDW: 13.6 % (ref 11.5–15.5)
WBC: 6.6 10*3/uL (ref 4.0–10.5)
nRBC: 0 % (ref 0.0–0.2)

## 2019-11-08 MED ORDER — CEPHALEXIN 250 MG PO CAPS
500.0000 mg | ORAL_CAPSULE | Freq: Once | ORAL | Status: AC
  Administered 2019-11-08: 500 mg via ORAL
  Filled 2019-11-08: qty 2

## 2019-11-08 MED ORDER — OXYCODONE-ACETAMINOPHEN 5-325 MG PO TABS
1.0000 | ORAL_TABLET | Freq: Once | ORAL | Status: AC
  Administered 2019-11-08: 1 via ORAL
  Filled 2019-11-08: qty 1

## 2019-11-08 MED ORDER — CEPHALEXIN 500 MG PO CAPS: 500.0000 mg | ORAL_CAPSULE | Freq: Four times a day (QID) | ORAL | 0 refills | Status: AC

## 2019-11-08 MED ORDER — LIDOCAINE-EPINEPHRINE (PF) 2 %-1:200000 IJ SOLN
20.0000 mL | Freq: Once | INTRAMUSCULAR | Status: AC
  Administered 2019-11-08: 20 mL
  Filled 2019-11-08: qty 20

## 2019-11-08 NOTE — Telephone Encounter (Signed)
Daughter called to see what to do about Willie Schmidt's leg "turning black".  Instructed to go to ED per Dr Julien Nordmann. Daughter states she will take him now.  Had called after hours RN last night and patient refused to go the ED at that time.

## 2019-11-08 NOTE — ED Triage Notes (Signed)
Patient c/o right lower extremity swelling, skin discoloration, and increased pain. Patient states he has had symptoms x 1 1/2 months, but worsening. Patient states he has been tested for blood clots and gout.

## 2019-11-08 NOTE — ED Provider Notes (Signed)
.  Foreign Body Removal  Date/Time: 11/08/2019 8:10 PM Performed by: Maudie Flakes, MD Authorized by: Maudie Flakes, MD  Consent: Verbal consent obtained. Risks and benefits: risks, benefits and alternatives were discussed Consent given by: patient Patient understanding: patient states understanding of the procedure being performed Imaging studies: imaging studies available Patient identity confirmed: verbally with patient Time out: Immediately prior to procedure a "time out" was called to verify the correct patient, procedure, equipment, support staff and site/side marked as required. Intake: foot. Anesthesia: local infiltration  Anesthesia: Local Anesthetic: lidocaine 1% with epinephrine Anesthetic total: 4 mL Patient restrained: no Patient cooperative: yes Complexity: simple 1 objects recovered. Objects recovered: thin piece of metal Post-procedure assessment: foreign body removed Patient tolerance: patient tolerated the procedure well with no immediate complications Comments: 2 cm linear incision made to the dorsum of the foot above the second MCP adjacent to an area that was palpated and felt to be irregular and possibly have a foreign body underneath.  Tourniquet placed to the mid foot to aid with hemostasis.  With blunt dissection using needle drivers, a clicking sensation was felt more lateral to the incision site.  The incision was extended laterally to provide a flap that was able to be retracted and provide a window.  With further blunt dissection, aided by alligator forceps, foreign body was removed.  Foreign body was covered with irregular tissue but when uncovered it revealed a very thin metallic structure, possibly a needle.  See media tab for images.  Seems to be the correct size as mentioned in the CT imaging.  Wound was then thoroughly irrigated and there is continued search to ensure there is no foreign bodies remaining, no evidence of such. Marland Kitchen.Laceration  Repair  Date/Time: 11/08/2019 8:15 PM Performed by: Maudie Flakes, MD Authorized by: Maudie Flakes, MD   Consent:    Consent obtained:  Verbal   Consent given by:  Patient   Risks discussed:  Infection, need for additional repair, poor cosmetic result, pain, retained foreign body and poor wound healing Anesthesia (see MAR for exact dosages):    Anesthesia method:  Local infiltration Laceration details:    Location:  Foot   Foot location:  Top of R foot   Length (cm):  3   Depth (mm):  2 Repair type:    Repair type:  Simple Pre-procedure details:    Preparation:  Patient was prepped and draped in usual sterile fashion Exploration:    Hemostasis achieved with:  Direct pressure   Wound exploration: wound explored through full range of motion and entire depth of wound probed and visualized     Contaminated: yes   Treatment:    Area cleansed with:  Saline   Amount of cleaning:  Extensive Skin repair:    Repair method:  Sutures   Suture size:  4-0   Suture material:  Prolene   Suture technique:  Simple interrupted   Number of sutures:  4 Approximation:    Approximation:  Loose Post-procedure details:    Dressing:  Antibiotic ointment   Patient tolerance of procedure:  Tolerated well, no immediate complications Comments:     After removal of foreign body, incision site was closed loosely with Prolene sutures.      Maudie Flakes, MD 11/08/19 2016

## 2019-11-08 NOTE — ED Triage Notes (Signed)
He was at [facility] and left before being seen. He is here with pain and discoloration to his right lower leg x 6 weeks. He was tested for gout and DVT. The results were negative.

## 2019-11-08 NOTE — Discharge Instructions (Addendum)
1. Medications: Tylenol or ibuprofen for pain, usual home medications -Prescription sent to your pharmacy for Keflex.  This is an antibiotic.  Please take as prescribed  2. Treatment: ice for swelling, keep wound clean with warm soap and water and keep bandage dry, do not submerge in water for 24 hours  3. Follow Up: Please return in 7 days to have your stitches removed or sooner if you have concerns. Return to the emergency department for increased redness, drainage of pus from the wound   WOUND CARE  Keep area clean and dry for 24 hours. Do not remove bandage, if applied.  After 24 hours, remove bandage and wash wound gently with mild soap and warm water. Reapply a new bandage after cleaning wound, if directed.   Continue daily cleansing with soap and water until stitches/staples are removed.  Do not apply any ointments or creams to the wound while stitches/staples are in place, as this may cause delayed healing. Return if you experience any of the following signs of infection: Swelling, redness, pus drainage, streaking, fever >101.0 F  Return if you experience excessive bleeding that does not stop after 15-20 minutes of constant, firm pressure.

## 2019-11-08 NOTE — ED Provider Notes (Signed)
[facility] EMERGENCY DEPARTMENT Provider Note   CSN: [id number] Arrival date & time: 11/08/19  1442     History   Chief Complaint Chief Complaint  Patient presents with  . Foot Pain    HPI Willie Schmidt is a 58 y.o. male with past medical history significant for stage IV non-small cell lung cancer, adenocarcinoma presents to emergency department today with chief complaint of right foot pain x6 weeks.   Patient describes pain as sharp, intermittent, worse with movement. Rates pain 8/10. Pain is located in dorsal aspect of foot. Pain does not radiate.  He has been taking percocet for pain without symptom relief. He denies any injury to foot. He has had negative dvt study in October when he presented with similar foot pain.  He denies fevers, chills, numbness, weakness, swelling.  His oncologist is Dr. Earlie Server.  His current therapy is systemic chemotherapy with carboplatin, Alimta and Keytruda status post 11 cycles.  Starting from cycle #5 he is receiving maintenance treatment with Alimta and Keytruda. Last chemotherapy was on 10/28/2019.  Past Medical History:  Diagnosis Date  . Bronchitis, chronic ([place])   . Hypertension   . metastatic lung ca dx'd 02/2019   lyphadenopathy, bil pulmonary noduels; suspected bone lesions    Patient Active Problem List   Diagnosis Date Noted  . Right shoulder pain 04/22/2019  . Adenocarcinoma of left lung, stage 4 ([name]) 03/03/2019  . Encounter for antineoplastic chemotherapy 03/03/2019  . Encounter for antineoplastic immunotherapy 03/03/2019  . Goals of care, counseling/discussion 03/03/2019  . Neck pain on right side 03/03/2019  . Neck pain 02/17/2019    History reviewed. No pertinent surgical history.      Home Medications    Prior to Admission medications   Medication Sig Start Date End Date Taking? Authorizing Provider  albuterol (PROVENTIL HFA;VENTOLIN HFA) 108 (90 BASE) MCG/ACT inhaler Inhale 2 puffs into the lungs  every 6 (six) hours as needed for wheezing. 02/10/13   Hyman Bible, PA-C  amLODipine (NORVASC) 5 MG tablet TK 1 T PO Q NIGHT 06/25/19   [provider]  aspirin 81 MG tablet Take 81 mg by mouth daily.    [provider]  cephALEXin (KEFLEX) 500 MG capsule Take 1 capsule (500 mg total) by mouth 4 (four) times daily for 7 days. 11/08/19 11/15/19  Albrizze, Kaitlyn E, PA-C  cyclobenzaprine (FLEXERIL) 10 MG tablet TAKE 1 TABLET BY MOUTH THREE TIMES DAILY AS NEEDED FOR MUSCLE SPASMS 09/30/19   Curt Bears, MD  DULoxetine (CYMBALTA) 30 MG capsule 1 ONCE DAILY FOR 1 WEEK THEN INCREASE TO 1 TWICE DAILY 10/25/19   Curt Bears, MD  folic acid (FOLVITE) 1 MG tablet TAKE 1 TABLET(1 MG) BY MOUTH DAILY 08/04/19   Curt Bears, MD  furosemide (LASIX) 20 MG tablet 20 mg PO AM x 3 days then stop. Repeat if edema recurs 10/20/19   Sandi Mealy E., PA-C  gabapentin (NEURONTIN) 300 MG capsule Take 1 capsule (300 mg total) by mouth 3 (three) times daily. 10/07/19   Heilingoetter, Cassandra L, PA-C  indomethacin (INDOCIN) 50 MG capsule Take 1 capsule (50 mg total) by mouth 3 (three) times daily with meals. 10/07/19   Heilingoetter, Cassandra L, PA-C  lisinopril-hydrochlorothiazide (ZESTORETIC) 20-25 MG tablet Take 1 tablet by mouth daily. 07/23/19   [provider]  meloxicam (MOBIC) 15 MG tablet TK 1 T PO QD WF 06/01/19   [provider]  oxyCODONE-acetaminophen (PERCOCET/ROXICET) 5-325 MG tablet Take 1 tablet by mouth  every 8 (eight) hours as needed for severe pain. 10/28/19   Curt Bears, MD  potassium chloride SA (KLOR-CON) 20 MEQ tablet 1 Tablet once daily while taking Lasix 10/20/19   Tanner, Lucianne Lei E., PA-C  prochlorperazine (COMPAZINE) 10 MG tablet TAKE 1 TABLET(10 MG) BY MOUTH EVERY 6 HOURS AS NEEDED FOR NAUSEA OR VOMITING 09/30/19   Curt Bears, MD  sulfamethoxazole-trimethoprim (BACTRIM DS) 800-160 MG tablet Take 1 tablet by mouth 2 (two) times daily. 10/20/19    Harle Stanford., PA-C    Family History Family History  Problem Relation Age of Onset  . Chronic Renal Failure Mother     Social History Social History   Tobacco Use  . Smoking status: Former Smoker    Types: Cigarettes  . Smokeless tobacco: Never Used  Substance Use Topics  . Alcohol use: Yes  . Drug use: No     Allergies   Patient has no known allergies.   Review of Systems Review of Systems  Constitutional: Negative for chills and fever.  HENT: Negative for congestion, rhinorrhea, sinus pressure and sore throat.   Eyes: Negative for pain and redness.  Respiratory: Negative for cough, shortness of breath and wheezing.   Cardiovascular: Negative for chest pain, palpitations and leg swelling.  Gastrointestinal: Negative for abdominal pain, constipation, diarrhea, nausea and vomiting.  Genitourinary: Negative for dysuria.  Musculoskeletal: Positive for arthralgias. Negative for back pain, joint swelling, myalgias and neck pain.  Skin: Negative for color change, rash and wound.  Neurological: Negative for dizziness, syncope, weakness, numbness and headaches.  Psychiatric/Behavioral: Negative for confusion.     Physical Exam Updated Vital Signs BP (!) 141/99   Pulse (!) 102   Temp 97.7 F (36.5 C) (Oral)   Resp 20   SpO2 98%   Physical Exam Vitals signs and nursing note reviewed.  Constitutional:      General: He is not in acute distress.    Appearance: He is not ill-appearing.  HENT:     Head: Normocephalic and atraumatic.     Right Ear: Tympanic membrane and external ear normal.     Left Ear: Tympanic membrane and external ear normal.     Nose: Nose normal.     Mouth/Throat:     Mouth: Mucous membranes are moist.     Pharynx: Oropharynx is clear.  Eyes:     General: No scleral icterus.       Right eye: No discharge.        Left eye: No discharge.     Extraocular Movements: Extraocular movements intact.     Conjunctiva/sclera: Conjunctivae normal.      Pupils: Pupils are equal, round, and reactive to light.  Neck:     Musculoskeletal: Normal range of motion.     Vascular: No JVD.  Cardiovascular:     Rate and Rhythm: Normal rate and regular rhythm.     Pulses: Normal pulses.          Radial pulses are 2+ on the right side and 2+ on the left side.       Dorsalis pedis pulses are 2+ on the right side and 2+ on the left side.     Heart sounds: Normal heart sounds.  Pulmonary:     Comments: Lungs clear to auscultation in all fields. Symmetric chest rise. No wheezing, rales, or rhonchi. Abdominal:     Comments: Abdomen is soft, non-distended, and non-tender in all quadrants. No rigidity, no guarding. No peritoneal signs.  Musculoskeletal: Normal  range of motion.       Feet:     Comments: Right knee with full ROM. Able to wiggle toes on right foot.   Feet:     Comments: See media below of removed foreign body. Skin:    General: Skin is warm and dry.     Capillary Refill: Capillary refill takes less than 2 seconds.  Neurological:     Mental Status: He is oriented to person, place, and time.     GCS: GCS eye subscore is 4. GCS verbal subscore is 5. GCS motor subscore is 6.     Comments: Fluent speech, no facial droop.  Psychiatric:        Behavior: Behavior normal.        ED Treatments / Results  Labs (all labs ordered are listed, but only abnormal results are displayed) Labs Reviewed  CBC WITH DIFFERENTIAL/PLATELET - Abnormal; Notable for the following components:      Result Value   RBC 3.70 (*)    Hemoglobin 11.7 (*)    HCT 37.1 (*)    MCV 100.3 (*)    Monocytes Absolute 1.2 (*)    All other components within normal limits  BASIC METABOLIC PANEL - Abnormal; Notable for the following components:   Sodium 130 (*)    Chloride 91 (*)    Glucose, Bld 100 (*)    All other components within normal limits    EKG None  Radiology Ct Extremity Lower Right Wo Contrast  Result Date: 11/08/2019 CLINICAL DATA:  Pain and  discoloration of the right lower leg for 6 weeks. EXAM: CT OF THE LOWER RIGHT EXTREMITY WITHOUT CONTRAST TECHNIQUE: Multidetector CT imaging of the right lower extremity was performed according to the standard protocol. COMPARISON:  Bone scan dated 09/09/2019 FINDINGS: Bones/Joint/Cartilage The bones of the knee, ankle, and right lower leg appear normal. There are no ankle or knee effusions. There is slight nonspecific soft tissue edema anterior to the right tibia, nonspecific. No evidence of stress fracture. Ligaments Suboptimally assessed by CT. The ligaments at the knee appear normal. Muscles and Tendons Normal. Soft tissues There is a 15 mm linear metallic fragment in the soft tissues of the dorsum of the foot overlying the distal second metatarsal. This may represent a needle fragment. IMPRESSION: 1. Slight nonspecific soft tissue edema anterior to the right tibia. 2. Linear metallic foreign body in the soft tissues of the dorsum of the distal forefoot. 3. Otherwise, normal exam. Electronically Signed   By: Lorriane Shire M.D.   On: 11/08/2019 17:35    Procedures Procedures (including critical care time)  Medications Ordered in ED Medications  oxyCODONE-acetaminophen (PERCOCET/ROXICET) 5-325 MG per tablet 1 tablet (1 tablet Oral Given 11/08/19 1719)  lidocaine-EPINEPHrine (XYLOCAINE W/EPI) 2 %-1:200000 (PF) injection 20 mL (20 mLs Infiltration Given by Other 11/08/19 1845)  cephALEXin (KEFLEX) capsule 500 mg (500 mg Oral Given 11/08/19 2046)     Initial Impression / Assessment and Plan / ED Course  I have reviewed the triage vital signs and the nursing notes.  Pertinent labs & imaging results that were available during my care of the patient were reviewed by me and considered in my medical decision making (see chart for details).  Patient seen and examined. He is afebrile, noted to be tachycardic to 102 in triage. He is non toxic in appearance. Chart review shows he has mild tachycardia at  most doctors appointments.  He has tenderness to palpation of dorsal aspect of right foot.  No obvious wound or abscess. CBC and BMP overall unremarkable today. I viewed patients chart. He has been seen multiple times for foot pain. He has had negative DVT studies. He was treated for possible cellulitis with clindamycin and reports no change in symptoms. Do not feel exam today is consistent with infection. CT leg to look for fractures or possible mets although have low suspicion for this. Ct shows linear metallic foreign body in the soft tissues of the dorsum of the distal forefoot. I asked pt about possible injury or splinter to determine what foreign body is. He denies IV drug use or injecting between toes. ED attending Dr. Sedonia Small was able to remove foreign body, please see procedure note. Patient tolerated well. Wound sutured and pt to return in 7 days for suture removal and wound check. Will discharge with keflex to cover for infection. Pt unsure of last tetanus immunization. Discussed with patient he needs to update tetanus today, but he is insistent on being discharged immediately because his ride is here and it is his only way home. He will follow up with pcp for tetanus.  The patient appears reasonably screened and/or stabilized for discharge and I doubt any other medical condition or other [facility] requiring further screening, evaluation, or treatment in the ED at this time prior to discharge. The patient is safe for discharge with strict return precautions discussed. This is shared ED visit with attending Dr. Sedonia Small. Please see his separate note.   Portions of this note were generated with [employer]. Dictation errors may occur despite best attempts at proofreading.     Final Clinical Impressions(s) / ED Diagnoses   Final diagnoses:  Foreign body in right foot, initial encounter    ED Discharge Orders         Ordered    cephALEXin (KEFLEX) 500 MG capsule  4 times daily      11/08/19 2052           Cherre Robins, PA-C 11/10/19 0122    Maudie Flakes, MD 11/10/19 [phone]

## 2019-11-15 ENCOUNTER — Ambulatory Visit (HOSPITAL_COMMUNITY)
Admission: RE | Admit: 2019-11-15 | Discharge: 2019-11-15 | Disposition: A | Discharge status: Home / Self Care | Payer: Medicaid Other | Source: Ambulatory Visit | Attending: Internal Medicine | Admitting: Internal Medicine

## 2019-11-15 ENCOUNTER — Other Ambulatory Visit: Payer: Self-pay

## 2019-11-15 ENCOUNTER — Encounter (HOSPITAL_COMMUNITY): Payer: Self-pay

## 2019-11-15 DIAGNOSIS — C3490 Malignant neoplasm of unspecified part of unspecified bronchus or lung: Secondary | ICD-10-CM

## 2019-11-15 MED ORDER — IOHEXOL 300 MG/ML  SOLN
100.0000 mL | Freq: Once | INTRAMUSCULAR | Status: AC | PRN
  Administered 2019-11-15: 100 mL via INTRAVENOUS

## 2019-11-15 MED ORDER — SODIUM CHLORIDE (PF) 0.9 % IJ SOLN
INTRAMUSCULAR | Status: AC
  Filled 2019-11-15: qty 50

## 2019-11-17 ENCOUNTER — Inpatient Hospital Stay: Payer: Medicaid Other

## 2019-11-17 ENCOUNTER — Inpatient Hospital Stay (HOSPITAL_BASED_OUTPATIENT_CLINIC_OR_DEPARTMENT_OTHER): Payer: Medicaid Other | Admitting: Internal Medicine

## 2019-11-17 ENCOUNTER — Encounter: Payer: Self-pay | Admitting: Internal Medicine

## 2019-11-17 ENCOUNTER — Other Ambulatory Visit: Payer: Self-pay

## 2019-11-17 VITALS — HR 100

## 2019-11-17 VITALS — BP 155/95 | HR 106 | Temp 97.0°F | Resp 20 | Ht 69.0 in | Wt 252.4 lb

## 2019-11-17 DIAGNOSIS — Z5111 Encounter for antineoplastic chemotherapy: Secondary | ICD-10-CM | POA: Diagnosis not present

## 2019-11-17 DIAGNOSIS — M542 Cervicalgia: Secondary | ICD-10-CM

## 2019-11-17 DIAGNOSIS — C3492 Malignant neoplasm of unspecified part of left bronchus or lung: Secondary | ICD-10-CM

## 2019-11-17 DIAGNOSIS — M25511 Pain in right shoulder: Secondary | ICD-10-CM

## 2019-11-17 DIAGNOSIS — G8929 Other chronic pain: Secondary | ICD-10-CM

## 2019-11-17 DIAGNOSIS — Z5112 Encounter for antineoplastic immunotherapy: Secondary | ICD-10-CM

## 2019-11-17 LAB — CBC WITH DIFFERENTIAL (CANCER CENTER ONLY)
Abs Immature Granulocytes: 0.01 10*3/uL (ref 0.00–0.07)
Basophils Absolute: 0 10*3/uL (ref 0.0–0.1)
Basophils Relative: 0 %
Eosinophils Absolute: 0.3 10*3/uL (ref 0.0–0.5)
Eosinophils Relative: 4 %
HCT: 36.6 % — ABNORMAL LOW (ref 39.0–52.0)
Hemoglobin: 11.8 g/dL — ABNORMAL LOW (ref 13.0–17.0)
Immature Granulocytes: 0 %
Lymphocytes Relative: 34 %
Lymphs Abs: 2.6 10*3/uL (ref 0.7–4.0)
MCH: 31.7 pg (ref 26.0–34.0)
MCHC: 32.2 g/dL (ref 30.0–36.0)
MCV: 98.4 fL (ref 80.0–100.0)
Monocytes Absolute: 1 10*3/uL (ref 0.1–1.0)
Monocytes Relative: 13 %
Neutro Abs: 3.6 10*3/uL (ref 1.7–7.7)
Neutrophils Relative %: 49 %
Platelet Count: 329 10*3/uL (ref 150–400)
RBC: 3.72 MIL/uL — ABNORMAL LOW (ref 4.22–5.81)
RDW: 14 % (ref 11.5–15.5)
WBC Count: 7.6 10*3/uL (ref 4.0–10.5)
nRBC: 0 % (ref 0.0–0.2)

## 2019-11-17 LAB — CMP (CANCER CENTER ONLY)
ALT: 32 U/L (ref 0–44)
AST: 29 U/L (ref 15–41)
Albumin: 4.1 g/dL (ref 3.5–5.0)
Alkaline Phosphatase: 121 U/L (ref 38–126)
Anion gap: 13 (ref 5–15)
BUN: 9 mg/dL (ref 6–20)
CO2: 26 mmol/L (ref 22–32)
Calcium: 9.4 mg/dL (ref 8.9–10.3)
Chloride: 98 mmol/L (ref 98–111)
Creatinine: 0.83 mg/dL (ref 0.61–1.24)
GFR, Est AFR Am: >60 mL/min (ref >60)
GFR, Estimated: >60 mL/min (ref >60)
Glucose, Bld: 77 mg/dL (ref 70–99)
Potassium: 3.7 mmol/L (ref 3.5–5.1)
Sodium: 137 mmol/L (ref 135–145)
Total Bilirubin: 0.4 mg/dL (ref 0.3–1.2)
Total Protein: 8.4 g/dL — ABNORMAL HIGH (ref 6.5–8.1)

## 2019-11-17 MED ORDER — SODIUM CHLORIDE 0.9 % IV SOLN
Freq: Once | INTRAVENOUS | Status: AC
  Administered 2019-11-17: 10:00:00 via INTRAVENOUS
  Filled 2019-11-17: qty 250

## 2019-11-17 MED ORDER — SODIUM CHLORIDE 0.9 % IV SOLN
200.0000 mg | Freq: Once | INTRAVENOUS | Status: AC
  Administered 2019-11-17: 200 mg via INTRAVENOUS
  Filled 2019-11-17: qty 8

## 2019-11-17 MED ORDER — PROCHLORPERAZINE MALEATE 10 MG PO TABS
ORAL_TABLET | ORAL | Status: AC
  Filled 2019-11-17: qty 1

## 2019-11-17 MED ORDER — OXYCODONE-ACETAMINOPHEN 5-325 MG PO TABS: 1.0000 | ORAL_TABLET | Freq: Three times a day (TID) | ORAL | 0 refills | Status: DC | PRN

## 2019-11-17 MED ORDER — PROCHLORPERAZINE MALEATE 10 MG PO TABS
10.0000 mg | ORAL_TABLET | Freq: Once | ORAL | Status: AC
  Administered 2019-11-17: 10 mg via ORAL

## 2019-11-17 MED ORDER — SODIUM CHLORIDE 0.9 % IV SOLN
500.0000 mg/m2 | Freq: Once | INTRAVENOUS | Status: AC
  Administered 2019-11-17: 1100 mg via INTRAVENOUS
  Filled 2019-11-17: qty 40

## 2019-11-17 NOTE — Patient Instructions (Signed)
[facility] Discharge Instructions for Patients Receiving Chemotherapy  Today you received the following chemotherapy agents keytruda, alimta  To help prevent nausea and vomiting after your treatment, we encourage you to take your nausea medication.   If you develop nausea and vomiting that is not controlled by your nausea medication, call the clinic.   BELOW ARE SYMPTOMS THAT SHOULD BE REPORTED IMMEDIATELY:  *FEVER GREATER THAN 100.5 F  *CHILLS WITH OR WITHOUT FEVER  NAUSEA AND VOMITING THAT IS NOT CONTROLLED WITH YOUR NAUSEA MEDICATION  *UNUSUAL SHORTNESS OF BREATH  *UNUSUAL BRUISING OR BLEEDING  TENDERNESS IN MOUTH AND THROAT WITH OR WITHOUT PRESENCE OF ULCERS  *URINARY PROBLEMS  *BOWEL PROBLEMS  UNUSUAL RASH Items with * indicate a potential emergency and should be followed up as soon as possible.  Feel free to call the clinic should you have any questions or concerns. The clinic phone number is [phone].  Please show the [place] at check-in to the Emergency Department and triage nurse.

## 2019-11-17 NOTE — Progress Notes (Signed)
[place] Telephone:([phone]) [phone]   Fax:(336) [phone]  OFFICE PROGRESS NOTE  [name], [address] [place] [address]  DIAGNOSIS: Stage IV (T2b, N3, M1 C) non-small cell lung cancer, adenocarcinoma presented with left upper lobe lung mass in addition to mediastinal and left supraclavicular lymphadenopathy as well as bilateral pulmonary nodules and suspicious bone lesion diagnosed in March 2020.  PRIOR THERAPY: None  CURRENT THERAPY: Systemic chemotherapy with carboplatin for AUC of 5, Alimta 500 mg/M2 and Keytruda 200 mg IV every 3 weeks.  First dose March 11, 2019.  Status post 12 cycles.  Starting from cycle #5 the patient is on maintenance treatment with Alimta and Keytruda every 3 weeks.  INTERVAL HISTORY: Willie Schmidt 58 y.o. male returns to the clinic today for follow-up visit.  The patient is feeling fine today with no concerning complaints.  His shoulder pain is much better after he received steroid injection to that area.  He is requesting refill of his pain medication.  He denied having any chest pain, shortness of breath, cough or hemoptysis.  He denied having any fever or chills.  He has no nausea, vomiting, diarrhea or constipation.  He has no headache or visual changes.  He has no recent weight loss or night sweats.  The patient had repeat CT scan of the chest, abdomen pelvis performed recently and he is here for evaluation and discussion of his scan results.   MEDICAL HISTORY: Past Medical History:  Diagnosis Date   Bronchitis, chronic ([place])    Hypertension    metastatic lung ca dx'd 02/2019   lyphadenopathy, bil pulmonary noduels; suspected bone lesions    ALLERGIES:  has No Known Allergies.  MEDICATIONS:  Current Outpatient Medications  Medication Sig Dispense Refill   albuterol (PROVENTIL HFA;VENTOLIN HFA) 108 (90 BASE) MCG/ACT inhaler Inhale 2 puffs into the lungs every 6 (six) hours as needed for wheezing. 1 Inhaler 2     amLODipine (NORVASC) 5 MG tablet TK 1 T PO Q NIGHT     aspirin 81 MG tablet Take 81 mg by mouth daily.     cyclobenzaprine (FLEXERIL) 10 MG tablet TAKE 1 TABLET BY MOUTH THREE TIMES DAILY AS NEEDED FOR MUSCLE SPASMS 30 tablet 0   DULoxetine (CYMBALTA) 30 MG capsule 1 ONCE DAILY FOR 1 WEEK THEN INCREASE TO 1 TWICE DAILY 60 capsule 3   folic acid (FOLVITE) 1 MG tablet TAKE 1 TABLET(1 MG) BY MOUTH DAILY 30 tablet 4   furosemide (LASIX) 20 MG tablet 20 mg PO AM x 3 days then stop. Repeat if edema recurs 30 tablet 1   gabapentin (NEURONTIN) 300 MG capsule Take 1 capsule (300 mg total) by mouth 3 (three) times daily. 90 capsule 3   indomethacin (INDOCIN) 50 MG capsule Take 1 capsule (50 mg total) by mouth 3 (three) times daily with meals. 12 capsule 0   lisinopril-hydrochlorothiazide (ZESTORETIC) 20-25 MG tablet Take 1 tablet by mouth daily.     meloxicam (MOBIC) 15 MG tablet TK 1 T PO QD WF     oxyCODONE-acetaminophen (PERCOCET/ROXICET) 5-325 MG tablet Take 1 tablet by mouth every 8 (eight) hours as needed for severe pain. 30 tablet 0   potassium chloride SA (KLOR-CON) 20 MEQ tablet 1 Tablet once daily while taking Lasix 30 tablet 1   prochlorperazine (COMPAZINE) 10 MG tablet TAKE 1 TABLET(10 MG) BY MOUTH EVERY 6 HOURS AS NEEDED FOR NAUSEA OR VOMITING 30 tablet 0  sulfamethoxazole-trimethoprim (BACTRIM DS) 800-160 MG tablet Take 1 tablet by mouth 2 (two) times daily. 14 tablet 0   No current facility-administered medications for this visit.     SURGICAL HISTORY: No past surgical history on file.  REVIEW OF SYSTEMS:  Constitutional: negative Eyes: negative Ears, nose, mouth, throat, and face: negative Respiratory: negative Cardiovascular: negative Gastrointestinal: negative Genitourinary:negative Integument/breast: negative Hematologic/lymphatic: negative Musculoskeletal:positive for arthralgias Neurological: negative Behavioral/Psych: negative Endocrine:  negative Allergic/Immunologic: negative   PHYSICAL EXAMINATION: General appearance: alert, cooperative, fatigued and no distress Head: Normocephalic, without obvious abnormality, atraumatic Neck: no adenopathy, no JVD, supple, symmetrical, trachea midline and thyroid not enlarged, symmetric, no tenderness/mass/nodules Lymph nodes: Cervical, supraclavicular, and axillary nodes normal. Resp: clear to auscultation bilaterally Back: symmetric, no curvature. ROM normal. No CVA tenderness. Cardio: regular rate and rhythm, S1, S2 normal, no murmur, click, rub or gallop GI: soft, non-tender; bowel sounds normal; no masses,  no organomegaly Extremities: extremities normal, atraumatic, no cyanosis or edema Neurologic: Alert and oriented X 3, normal strength and tone. Normal symmetric reflexes. Normal coordination and gait  ECOG PERFORMANCE STATUS: 1 - Symptomatic but completely ambulatory  Blood pressure (!) 155/95, pulse (!) 106, temperature (!) 97 F (36.1 C), temperature source Oral, resp. rate 20, height 5\' 9"  (1.753 m), weight 252 lb 6.4 oz (114.5 kg), SpO2 100 %.  LABORATORY DATA: Lab Results  Component Value Date   WBC 7.6 11/17/2019   HGB 11.8 (L) 11/17/2019   HCT 36.6 (L) 11/17/2019   MCV 98.4 11/17/2019   PLT 329 11/17/2019      Chemistry      Component Value Date/Time   NA 130 (L) 11/08/2019 1714   K 3.6 11/08/2019 1714   CL 91 (L) 11/08/2019 1714   CO2 27 11/08/2019 1714   BUN 11 11/08/2019 1714   CREATININE 0.91 11/08/2019 1714   CREATININE 1.03 10/28/2019 0935      Component Value Date/Time   CALCIUM 9.4 11/08/2019 1714   ALKPHOS 109 10/28/2019 0935   AST 30 10/28/2019 0935   ALT 29 10/28/2019 0935   BILITOT 0.5 10/28/2019 0935       RADIOGRAPHIC STUDIES: Ct Chest W Contrast  Result Date: 11/15/2019 CLINICAL DATA:  Lung cancer staging EXAM: CT CHEST, ABDOMEN, AND PELVIS WITH CONTRAST TECHNIQUE: Multidetector CT imaging of the chest, abdomen and pelvis was  performed following the standard protocol during bolus administration of intravenous contrast. CONTRAST:  19mL OMNIPAQUE IOHEXOL 300 MG/ML SOLN, additional oral enteric contrast COMPARISON:  09/21/2019, 07/12/2019 FINDINGS: CT CHEST FINDINGS Cardiovascular: No significant vascular findings. Normal heart size. No pericardial effusion. Mediastinum/Nodes: Unchanged post treatment appearance of left hilar and AP window soft tissue thickening without discrete mediastinal lymphadenopathy. Left supraclavicular lymph nodes have decreased in size, now subcentimeter (series 2, image 9). Thyroid gland, trachea, and esophagus demonstrate no significant findings. Lungs/Pleura: Unchanged trace left pleural effusion. Unchanged post treatment appearance of the left upper lobe with bronchial wall thickening, irregular opacity, nodularity, and architectural distortion. A ground-glass nodule of the posterior right upper lobe may have slightly increased in size measuring 1.3 cm, previously 0.9 cm (series 6, image 52). This lesion was new on examination dated 05/11/2019 but does not appear to have truly changed in size on multiple prior examinations in the interval when accounting for slice selection. Musculoskeletal: Unchanged sclerotic lesion of the T10 vertebral body (series 5, image 99). CT ABDOMEN PELVIS FINDINGS Hepatobiliary: No solid liver abnormality is seen. Hepatic steatosis. No gallstones, gallbladder wall thickening, or biliary dilatation.  Pancreas: Previously seen pancreatic tail lesion remains resolved and inapparent. No evidence of recurrent lesion. No pancreatic ductal dilatation or surrounding inflammatory changes. Spleen: Normal in size without significant abnormality. Adrenals/Urinary Tract: Adrenal glands are unremarkable. Interval improvement in previously seen mild bilateral perinephric fat stranding. Bladder is unremarkable. Stomach/Bowel: Stomach is within normal limits. Appendix appears normal. No evidence of  bowel wall thickening, distention, or inflammatory changes. Vascular/Lymphatic: Aortic atherosclerosis. No enlarged abdominal or pelvic lymph nodes. Reproductive: No mass or other abnormality. Other: No abdominal wall hernia or abnormality. No abdominopelvic ascites. Musculoskeletal: Unchanged sclerotic lesion and superior endplate deformity of L5 (series 5, image 93). Unchanged, densely calcified or sclerotic lesion of the right femoral neck (series 4, image 101). IMPRESSION: 1. Unchanged post treatment appearance of the left upper lobe with bronchial wall thickening, irregular opacity, nodularity, and architectural distortion. No new pulmonary mass or nodule. 2. Unchanged post treatment appearance of left hilar and AP window soft tissue thickening without discrete mediastinal lymphadenopathy. Left supraclavicular lymph nodes have decreased in size, now subcentimeter (series 2, image 9). 3. A ground-glass nodule of the posterior right upper lobe may have slightly increased in size measuring 1.3 cm, previously 0.9 cm (series 6, image 52). This lesion was new on examination dated 05/11/2019 but does not appear to have truly changed in size on multiple prior examinations in the interval when accounting for slice selection. Generally continue to favor incidental infection or inflammation although indolent synchronous malignancy is not excluded. Attention on follow-up. 4. Previously seen pancreatic tail lesion remains resolved and inapparent. No evidence of recurrent lesion or other metastatic disease in the abdomen or pelvis. 5. 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 No evidence of new osseous metastatic disease. Unchanged, densely calcified or sclerotic lesion of the right femoral neck (series 4, image 101), which remains most consistent with a benign enchondroma or other incidental benign lesion. 6. Interval improvement in previously seen mild bilateral perinephric fat stranding, nonspecific and  infectious or inflammatory, differential considerations again including drug toxicity. 7.  Aortic Atherosclerosis (ICD10-I70.0). Electronically Signed   By: Eddie Candle M.D.   On: 11/15/2019 08:33   Ct Abdomen Pelvis W Contrast  Result Date: 11/15/2019 CLINICAL DATA:  Lung cancer staging EXAM: CT CHEST, ABDOMEN, AND PELVIS WITH CONTRAST TECHNIQUE: Multidetector CT imaging of the chest, abdomen and pelvis was performed following the standard protocol during bolus administration of intravenous contrast. CONTRAST:  150mL OMNIPAQUE IOHEXOL 300 MG/ML SOLN, additional oral enteric contrast COMPARISON:  09/21/2019, 07/12/2019 FINDINGS: CT CHEST FINDINGS Cardiovascular: No significant vascular findings. Normal heart size. No pericardial effusion. Mediastinum/Nodes: Unchanged post treatment appearance of left hilar and AP window soft tissue thickening without discrete mediastinal lymphadenopathy. Left supraclavicular lymph nodes have decreased in size, now subcentimeter (series 2, image 9). Thyroid gland, trachea, and esophagus demonstrate no significant findings. Lungs/Pleura: Unchanged trace left pleural effusion. Unchanged post treatment appearance of the left upper lobe with bronchial wall thickening, irregular opacity, nodularity, and architectural distortion. A ground-glass nodule of the posterior right upper lobe may have slightly increased in size measuring 1.3 cm, previously 0.9 cm (series 6, image 52). This lesion was new on examination dated 05/11/2019 but does not appear to have truly changed in size on multiple prior examinations in the interval when accounting for slice selection. Musculoskeletal: Unchanged sclerotic lesion of the T10 vertebral body (series 5, image 99). CT ABDOMEN PELVIS FINDINGS Hepatobiliary: No solid liver abnormality is seen. Hepatic steatosis. No gallstones, gallbladder wall thickening, or biliary dilatation.  Pancreas: Previously seen pancreatic tail lesion remains resolved and  inapparent. No evidence of recurrent lesion. No pancreatic ductal dilatation or surrounding inflammatory changes. Spleen: Normal in size without significant abnormality. Adrenals/Urinary Tract: Adrenal glands are unremarkable. Interval improvement in previously seen mild bilateral perinephric fat stranding. Bladder is unremarkable. Stomach/Bowel: Stomach is within normal limits. Appendix appears normal. No evidence of bowel wall thickening, distention, or inflammatory changes. Vascular/Lymphatic: Aortic atherosclerosis. No enlarged abdominal or pelvic lymph nodes. Reproductive: No mass or other abnormality. Other: No abdominal wall hernia or abnormality. No abdominopelvic ascites. Musculoskeletal: Unchanged sclerotic lesion and superior endplate deformity of L5 (series 5, image 93). Unchanged, densely calcified or sclerotic lesion of the right femoral neck (series 4, image 101). IMPRESSION: 1. Unchanged post treatment appearance of the left upper lobe with bronchial wall thickening, irregular opacity, nodularity, and architectural distortion. No new pulmonary mass or nodule. 2. Unchanged post treatment appearance of left hilar and AP window soft tissue thickening without discrete mediastinal lymphadenopathy. Left supraclavicular lymph nodes have decreased in size, now subcentimeter (series 2, image 9). 3. A ground-glass nodule of the posterior right upper lobe may have slightly increased in size measuring 1.3 cm, previously 0.9 cm (series 6, image 52). This lesion was new on examination dated 05/11/2019 but does not appear to have truly changed in size on multiple prior examinations in the interval when accounting for slice selection. Generally continue to favor incidental infection or inflammation although indolent synchronous malignancy is not excluded. Attention on follow-up. 4. Previously seen pancreatic tail lesion remains resolved and inapparent. No evidence of recurrent lesion or other metastatic disease in  the abdomen or pelvis. 5. 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 No evidence of new osseous metastatic disease. Unchanged, densely calcified or sclerotic lesion of the right femoral neck (series 4, image 101), which remains most consistent with a benign enchondroma or other incidental benign lesion. 6. Interval improvement in previously seen mild bilateral perinephric fat stranding, nonspecific and infectious or inflammatory, differential considerations again including drug toxicity. 7.  Aortic Atherosclerosis (ICD10-I70.0). Electronically Signed   By: Eddie Candle M.D.   On: 11/15/2019 08:33   Ct Extremity Lower Right Wo Contrast  Result Date: 11/08/2019 CLINICAL DATA:  Pain and discoloration of the right lower leg for 6 weeks. EXAM: CT OF THE LOWER RIGHT EXTREMITY WITHOUT CONTRAST TECHNIQUE: Multidetector CT imaging of the right lower extremity was performed according to the standard protocol. COMPARISON:  Bone scan dated 09/09/2019 FINDINGS: Bones/Joint/Cartilage The bones of the knee, ankle, and right lower leg appear normal. There are no ankle or knee effusions. There is slight nonspecific soft tissue edema anterior to the right tibia, nonspecific. No evidence of stress fracture. Ligaments Suboptimally assessed by CT. The ligaments at the knee appear normal. Muscles and Tendons Normal. Soft tissues There is a 15 mm linear metallic fragment in the soft tissues of the dorsum of the foot overlying the distal second metatarsal. This may represent a needle fragment. IMPRESSION: 1. Slight nonspecific soft tissue edema anterior to the right tibia. 2. Linear metallic foreign body in the soft tissues of the dorsum of the distal forefoot. 3. Otherwise, normal exam. Electronically Signed   By: Lorriane Shire M.D.   On: 11/08/2019 17:35   Vas [place] Lower Extremity Venous (dvt)  Result Date: 10/20/2019  Lower Venous Study Indications: Swelling, and Pain.  Comparison Study: previous study done 09/21/19  Performing Technologist: Abram Sander RVS  Examination Guidelines: A complete evaluation includes B-mode imaging, spectral Doppler,  color Doppler, and power Doppler as needed of all accessible portions of each vessel. Bilateral testing is considered an integral part of a complete examination. Limited examinations for reoccurring indications may be performed as noted.  +---------+---------------+---------+-----------+----------+--------------+  RIGHT     Compressibility Phasicity Spontaneity Properties Thrombus Aging  +---------+---------------+---------+-----------+----------+--------------+  CFV       Full            Yes       Yes                                    +---------+---------------+---------+-----------+----------+--------------+  SFJ       Full                                                             +---------+---------------+---------+-----------+----------+--------------+  FV Prox   Full                                                             +---------+---------------+---------+-----------+----------+--------------+  FV Mid    Full                                                             +---------+---------------+---------+-----------+----------+--------------+  FV Distal Full                                                             +---------+---------------+---------+-----------+----------+--------------+  PFV       Full                                                             +---------+---------------+---------+-----------+----------+--------------+  POP       Full            Yes       Yes                                    +---------+---------------+---------+-----------+----------+--------------+  PTV       Full                                                             +---------+---------------+---------+-----------+----------+--------------+  PERO  Not visualized   +---------+---------------+---------+-----------+----------+--------------+   +----+---------------+---------+-----------+----------+--------------+  LEFT Compressibility Phasicity Spontaneity Properties Thrombus Aging  +----+---------------+---------+-----------+----------+--------------+  CFV  Full            Yes       Yes                                    +----+---------------+---------+-----------+----------+--------------+     Summary: Right: There is no evidence of deep vein thrombosis in the lower extremity. No cystic structure found in the popliteal fossa. Left: No evidence of common femoral vein obstruction.  *See table(s) above for measurements and observations. Electronically signed by Harold Barban MD on 10/20/2019 at 1:10:06 PM.    Final     ASSESSMENT AND PLAN: This is a very pleasant 58 years old African-American male with likely stage IV non-small cell lung cancer, adenocarcinoma presented with large left upper lobe lung mass in addition to mediastinal and left supraclavicular lymphadenopathy as well as suspicious bone metastasis and bilateral pulmonary nodules diagnosed in March 2020. The patient is currently undergoing systemic chemotherapy with carboplatin, Alimta and Keytruda status post 12 cycles.  Starting from cycle #5 he is receiving maintenance treatment with Alimta and Keytruda. The patient has been tolerating this treatment well with no concerning adverse effects. He had repeat CT scan of the chest, abdomen pelvis performed recently.  I personally and independently reviewed the scans and discussed the results with the patient today. Has a scan showed no concerning findings for disease progression. I recommended for him to continue his current treatment with maintenance Alimta and Keytruda and he will proceed with cycle #13 today. For pain management I will give him refill of oxycodone. The patient will come back for follow-up visit in 3 weeks for evaluation before the next cycle  of his treatment. He was advised to call immediately if he has any concerning symptoms in the interval. The patient voices understanding of current disease status and treatment options and is in agreement with the current care plan.  All questions were answered. The patient knows to call the clinic with any problems, questions or concerns. We can certainly see the patient much sooner if necessary.   Disclaimer: This note was dictated with voice recognition software. Similar sounding words can inadvertently be transcribed and may not be corrected upon review.

## 2019-11-19 ENCOUNTER — Telehealth: Payer: Self-pay | Admitting: Internal Medicine

## 2019-11-19 NOTE — Telephone Encounter (Signed)
Scheduled per los. Called and spoke with patient. Confirmed appt 

## 2019-11-22 ENCOUNTER — Other Ambulatory Visit: Payer: Medicaid Other | Admitting: Internal Medicine

## 2019-11-22 ENCOUNTER — Other Ambulatory Visit: Payer: Self-pay

## 2019-11-22 DIAGNOSIS — C3492 Malignant neoplasm of unspecified part of left bronchus or lung: Secondary | ICD-10-CM

## 2019-11-22 DIAGNOSIS — Z515 Encounter for palliative care: Secondary | ICD-10-CM

## 2019-11-22 NOTE — Progress Notes (Signed)
[facility] Consult Note Telephone: [phone]  Fax: [phone]  PATIENT NAME: Willie Schmidt DOB: [birth year] MRN: [id number]  PRIMARY CARE PROVIDER:   [name], MD  REFERRING PROVIDER:  Dr. Curt Bears  RESPONSIBLE PARTY:   Self and [name] Cameron( daughter/POA)        RECOMMENDATIONS and PLAN:  Palliative Care Encounter  Z51.5  1. Advance Care Planning:  Patient goals remain to continue therapy for lung cancer at this time as long as he is tolerating treatments well. Advance directives remain as full scope of treatment and full code.  Palliative care will continue to follow patient.   2.  Foreign body foot:  Foot pain and edema have improved since removal of metal object.  Plans for suture removal by Dr. Berdine Addison.  Patient will make appointment for same.  Encouraged to wear shoes to prevent additional foot injury.  Check feet daily due to neuropathy.  Reminder for order of wheelchair has been given to nurse of Dr. Julien Nordmann.  Continue to monitor.  3.  Shortness of breath:  Intermittent and improves with rest.  No current use of oxygen.  Work/rest periods discussed. May have additional decline if lung cancer progresses. Monitor and notify Dr. Julien Nordmann if symptoms worsen.  4.   R shoulder pain:  Intermittent.  Arthritis per previous MRI results.  Tylenol 325-500mg  2 times per day if needed for mild pain. Pt. has home supply of Percocet if needed for severe pain.  Topical analgesics if desired.    Due to the COVID-19 crisis, this visit was performed telephonically and was consented and initiated by patient and or family member.   I spent 40 minutes providing this consultation,  from 1400 to 1440. More than 50% of the time in this consultation was spent coordinating communication with patient.   HISTORY OF PRESENT ILLNESS: Follow-up with Earma Reading.  He reports that he is "holding on" and continues cancer treatments(palliative).   Reports that he had a metal foreign object removed from his R foot in the ER and leg and foot swelling have improved since this procedure.  He continues to have intermittent shortness of breath.  Appetite is good and weight is stable at 250#.  He is still able to perform his own ADLs, Palliative Care was asked to help address goals of care.   CODE STATUS: FULL CODE  PPS: 60% HOSPICE ELIGIBILITY/DIAGNOSIS: YES/Metastatic lung cancer. Currently receiving Palliative chemotherapy  PAST MEDICAL HISTORY:  Past Medical History:  Diagnosis Date  . Bronchitis, chronic ([place])   . Hypertension   . metastatic lung ca dx'd 02/2019   lyphadenopathy, bil pulmonary noduels; suspected bone lesions    SOCIAL HX:  Social History   Tobacco Use  . Smoking status: Former Smoker    Types: Cigarettes  . Smokeless tobacco: Never Used  Substance Use Topics  . Alcohol use: Yes    ALLERGIES: No Known Allergies   PERTINENT MEDICATIONS:  Outpatient Encounter Medications as of 11/22/2019  Medication Sig  . albuterol (PROVENTIL HFA;VENTOLIN HFA) 108 (90 BASE) MCG/ACT inhaler Inhale 2 puffs into the lungs every 6 (six) hours as needed for wheezing.  Marland Kitchen amLODipine (NORVASC) 5 MG tablet TK 1 T PO Q NIGHT  . aspirin 81 MG tablet Take 81 mg by mouth daily.  . cyclobenzaprine (FLEXERIL) 10 MG tablet TAKE 1 TABLET BY MOUTH THREE TIMES DAILY AS NEEDED FOR MUSCLE SPASMS  . DULoxetine (CYMBALTA) 30 MG capsule 1 ONCE DAILY  FOR 1 WEEK THEN INCREASE TO 1 TWICE DAILY  . folic acid (FOLVITE) 1 MG tablet TAKE 1 TABLET(1 MG) BY MOUTH DAILY  . furosemide (LASIX) 20 MG tablet 20 mg PO AM x 3 days then stop. Repeat if edema recurs  . gabapentin (NEURONTIN) 300 MG capsule Take 1 capsule (300 mg total) by mouth 3 (three) times daily.  . indomethacin (INDOCIN) 50 MG capsule Take 1 capsule (50 mg total) by mouth 3 (three) times daily with meals.  Marland Kitchen lisinopril-hydrochlorothiazide (ZESTORETIC) 20-25 MG tablet Take 1 tablet by mouth  daily.  . meloxicam (MOBIC) 15 MG tablet TK 1 T PO QD WF  . oxyCODONE-acetaminophen (PERCOCET/ROXICET) 5-325 MG tablet Take 1 tablet by mouth every 8 (eight) hours as needed for severe pain.  . potassium chloride SA (KLOR-CON) 20 MEQ tablet 1 Tablet once daily while taking Lasix  . prochlorperazine (COMPAZINE) 10 MG tablet TAKE 1 TABLET(10 MG) BY MOUTH EVERY 6 HOURS AS NEEDED FOR NAUSEA OR VOMITING  . sulfamethoxazole-trimethoprim (BACTRIM DS) 800-160 MG tablet Take 1 tablet by mouth 2 (two) times daily.   No facility-administered encounter medications on file as of 11/22/2019.     PHYSICAL EXAM:   General: NAD Cardiovascular: unable to assess Pulmonary: speech is not forced Neurological: Alert and oriented x 3 Psych:  Calm and cooperative  Gonzella Lex, NP-C

## 2019-12-06 ENCOUNTER — Ambulatory Visit: Payer: Medicaid Other | Admitting: Orthopedic Surgery

## 2019-12-09 ENCOUNTER — Inpatient Hospital Stay: Payer: Medicaid Other

## 2019-12-09 ENCOUNTER — Inpatient Hospital Stay: Payer: Medicaid Other | Attending: Internal Medicine | Admitting: Internal Medicine

## 2019-12-09 ENCOUNTER — Encounter: Payer: Self-pay | Admitting: Internal Medicine

## 2019-12-09 ENCOUNTER — Other Ambulatory Visit: Payer: Self-pay

## 2019-12-09 VITALS — HR 97

## 2019-12-09 VITALS — BP 139/94 | HR 102 | Temp 97.9°F | Resp 17 | Ht 69.0 in | Wt 255.5 lb

## 2019-12-09 DIAGNOSIS — C3492 Malignant neoplasm of unspecified part of left bronchus or lung: Secondary | ICD-10-CM | POA: Diagnosis not present

## 2019-12-09 DIAGNOSIS — G8929 Other chronic pain: Secondary | ICD-10-CM

## 2019-12-09 DIAGNOSIS — Z5111 Encounter for antineoplastic chemotherapy: Secondary | ICD-10-CM | POA: Diagnosis present

## 2019-12-09 DIAGNOSIS — Z5112 Encounter for antineoplastic immunotherapy: Secondary | ICD-10-CM | POA: Diagnosis not present

## 2019-12-09 DIAGNOSIS — C3412 Malignant neoplasm of upper lobe, left bronchus or lung: Secondary | ICD-10-CM | POA: Insufficient documentation

## 2019-12-09 DIAGNOSIS — M25511 Pain in right shoulder: Secondary | ICD-10-CM | POA: Insufficient documentation

## 2019-12-09 LAB — CMP (CANCER CENTER ONLY)
ALT: 27 U/L (ref 0–44)
AST: 28 U/L (ref 15–41)
Albumin: 4.1 g/dL (ref 3.5–5.0)
Alkaline Phosphatase: 115 U/L (ref 38–126)
Anion gap: 13 (ref 5–15)
BUN: 10 mg/dL (ref 6–20)
CO2: 26 mmol/L (ref 22–32)
Calcium: 9 mg/dL (ref 8.9–10.3)
Chloride: 95 mmol/L — ABNORMAL LOW (ref 98–111)
Creatinine: 0.81 mg/dL (ref 0.61–1.24)
GFR, Est AFR Am: >60 mL/min (ref >60)
GFR, Estimated: >60 mL/min (ref >60)
Glucose, Bld: 105 mg/dL — ABNORMAL HIGH (ref 70–99)
Potassium: 3.5 mmol/L (ref 3.5–5.1)
Sodium: 134 mmol/L — ABNORMAL LOW (ref 135–145)
Total Bilirubin: 0.5 mg/dL (ref 0.3–1.2)
Total Protein: 8.2 g/dL — ABNORMAL HIGH (ref 6.5–8.1)

## 2019-12-09 LAB — CBC WITH DIFFERENTIAL (CANCER CENTER ONLY)
Abs Immature Granulocytes: 0.02 10*3/uL (ref 0.00–0.07)
Basophils Absolute: 0 10*3/uL (ref 0.0–0.1)
Basophils Relative: 0 %
Eosinophils Absolute: 0.4 10*3/uL (ref 0.0–0.5)
Eosinophils Relative: 4 %
HCT: 36.6 % — ABNORMAL LOW (ref 39.0–52.0)
Hemoglobin: 11.9 g/dL — ABNORMAL LOW (ref 13.0–17.0)
Immature Granulocytes: 0 %
Lymphocytes Relative: 35 %
Lymphs Abs: 3.1 10*3/uL (ref 0.7–4.0)
MCH: 32.2 pg (ref 26.0–34.0)
MCHC: 32.5 g/dL (ref 30.0–36.0)
MCV: 99.2 fL (ref 80.0–100.0)
Monocytes Absolute: 1.1 10*3/uL — ABNORMAL HIGH (ref 0.1–1.0)
Monocytes Relative: 12 %
Neutro Abs: 4.3 10*3/uL (ref 1.7–7.7)
Neutrophils Relative %: 49 %
Platelet Count: 290 10*3/uL (ref 150–400)
RBC: 3.69 MIL/uL — ABNORMAL LOW (ref 4.22–5.81)
RDW: 13.8 % (ref 11.5–15.5)
WBC Count: 8.9 10*3/uL (ref 4.0–10.5)
nRBC: 0 % (ref 0.0–0.2)

## 2019-12-09 MED ORDER — CYANOCOBALAMIN 1000 MCG/ML IJ SOLN
INTRAMUSCULAR | Status: AC
  Filled 2019-12-09: qty 1

## 2019-12-09 MED ORDER — SODIUM CHLORIDE 0.9 % IV SOLN
200.0000 mg | Freq: Once | INTRAVENOUS | Status: AC
  Administered 2019-12-09: 200 mg via INTRAVENOUS
  Filled 2019-12-09: qty 8

## 2019-12-09 MED ORDER — SODIUM CHLORIDE 0.9 % IV SOLN
500.0000 mg/m2 | Freq: Once | INTRAVENOUS | Status: AC
  Administered 2019-12-09: 1100 mg via INTRAVENOUS
  Filled 2019-12-09: qty 40

## 2019-12-09 MED ORDER — SODIUM CHLORIDE 0.9 % IV SOLN
Freq: Once | INTRAVENOUS | Status: AC
  Filled 2019-12-09: qty 250

## 2019-12-09 MED ORDER — PROCHLORPERAZINE MALEATE 10 MG PO TABS
10.0000 mg | ORAL_TABLET | Freq: Once | ORAL | Status: AC
  Administered 2019-12-09: 10 mg via ORAL

## 2019-12-09 MED ORDER — CYANOCOBALAMIN 1000 MCG/ML IJ SOLN
1000.0000 ug | Freq: Once | INTRAMUSCULAR | Status: AC
  Administered 2019-12-09: 1000 ug via INTRAMUSCULAR

## 2019-12-09 MED ORDER — PROCHLORPERAZINE MALEATE 10 MG PO TABS
ORAL_TABLET | ORAL | Status: AC
  Filled 2019-12-09: qty 1

## 2019-12-09 NOTE — Progress Notes (Signed)
[facility] [phone]   [phone]  OFFICE PROGRESS NOTE  [name], [address] [place] [address]  DIAGNOSIS: Stage IV (T2b, N3, M1 C) non-small cell lung cancer, adenocarcinoma presented with left upper lobe lung mass in addition to mediastinal and left supraclavicular lymphadenopathy as well as bilateral pulmonary nodules and suspicious bone lesion diagnosed in March 2020.  PRIOR THERAPY: None  CURRENT THERAPY: Systemic chemotherapy with carboplatin for AUC of 5, Alimta 500 mg/M2 and Keytruda 200 mg IV every 3 weeks.  First dose March 11, 2019.  Status post 13 cycles.  Starting from cycle #5 the patient is on maintenance treatment with Alimta and Keytruda every 3 weeks.  INTERVAL HISTORY: BRYSE BLANCHETTE 58 y.o. male returns to the clinic today for follow-up visit.  The patient is feeling fine today with no concerning complaints.  He denied having any chest pain, shortness of breath, cough or hemoptysis.  He denied having any fever or chills.  He has no nausea, vomiting, diarrhea or constipation.  He has no headache or visual changes.  He has no weight loss or night sweats.  He continues to have pain in the right shoulder area and he is currently undergoing physical therapy.  The patient is here today for evaluation before starting cycle #14 of his treatment.  MEDICAL HISTORY: Past Medical History:  Diagnosis Date  . Bronchitis, chronic ([place])   . Hypertension   . metastatic lung ca dx'd 02/2019   lyphadenopathy, bil pulmonary noduels; suspected bone lesions    ALLERGIES:  has No Known Allergies.  MEDICATIONS:  Current Outpatient Medications  Medication Sig Dispense Refill  . albuterol (PROVENTIL HFA;VENTOLIN HFA) 108 (90 BASE) MCG/ACT inhaler Inhale 2 puffs into the lungs every 6 (six) hours as needed for wheezing. 1 Inhaler 2  . amLODipine (NORVASC) 5 MG tablet TK 1 T PO Q NIGHT    . aspirin 81 MG tablet Take 81 mg by mouth  daily.    . cyclobenzaprine (FLEXERIL) 10 MG tablet TAKE 1 TABLET BY MOUTH THREE TIMES DAILY AS NEEDED FOR MUSCLE SPASMS 30 tablet 0  . DULoxetine (CYMBALTA) 30 MG capsule 1 ONCE DAILY FOR 1 WEEK THEN INCREASE TO 1 TWICE DAILY 60 capsule 3  . folic acid (FOLVITE) 1 MG tablet TAKE 1 TABLET(1 MG) BY MOUTH DAILY 30 tablet 4  . furosemide (LASIX) 20 MG tablet 20 mg PO AM x 3 days then stop. Repeat if edema recurs 30 tablet 1  . gabapentin (NEURONTIN) 300 MG capsule Take 1 capsule (300 mg total) by mouth 3 (three) times daily. 90 capsule 3  . indomethacin (INDOCIN) 50 MG capsule Take 1 capsule (50 mg total) by mouth 3 (three) times daily with meals. 12 capsule 0  . lisinopril-hydrochlorothiazide (ZESTORETIC) 20-25 MG tablet Take 1 tablet by mouth daily.    . meloxicam (MOBIC) 15 MG tablet TK 1 T PO QD WF    . oxyCODONE-acetaminophen (PERCOCET/ROXICET) 5-325 MG tablet Take 1 tablet by mouth every 8 (eight) hours as needed for severe pain. 30 tablet 0  . potassium chloride SA (KLOR-CON) 20 MEQ tablet 1 Tablet once daily while taking Lasix 30 tablet 1  . prochlorperazine (COMPAZINE) 10 MG tablet TAKE 1 TABLET(10 MG) BY MOUTH EVERY 6 HOURS AS NEEDED FOR NAUSEA OR VOMITING 30 tablet 0  . sulfamethoxazole-trimethoprim (BACTRIM DS) 800-160 MG tablet Take 1 tablet by mouth 2 (two) times daily. 14 tablet 0  No current facility-administered medications for this visit.    SURGICAL HISTORY: No past surgical history on file.  REVIEW OF SYSTEMS:  A comprehensive review of systems was negative except for: Musculoskeletal: positive for Right shoulder pain   PHYSICAL EXAMINATION: General appearance: alert, cooperative and no distress Head: Normocephalic, without obvious abnormality, atraumatic Neck: no adenopathy, no JVD, supple, symmetrical, trachea midline and thyroid not enlarged, symmetric, no tenderness/mass/nodules Lymph nodes: Cervical, supraclavicular, and axillary nodes normal. Resp: clear to  auscultation bilaterally Back: symmetric, no curvature. ROM normal. No CVA tenderness. Cardio: regular rate and rhythm, S1, S2 normal, no murmur, click, rub or gallop GI: soft, non-tender; bowel sounds normal; no masses,  no organomegaly Extremities: extremities normal, atraumatic, no cyanosis or edema  ECOG PERFORMANCE STATUS: 1 - Symptomatic but completely ambulatory  Blood pressure (!) 139/94, pulse (!) 102, temperature 97.9 F (36.6 C), temperature source Temporal, resp. rate 17, height 5\' 9"  (1.753 m), weight 255 lb 8 oz (115.9 kg), SpO2 100 %.  LABORATORY DATA: Lab Results  Component Value Date   WBC 8.9 12/09/2019   HGB 11.9 (L) 12/09/2019   HCT 36.6 (L) 12/09/2019   MCV 99.2 12/09/2019   PLT 290 12/09/2019      Chemistry      Component Value Date/Time   NA 137 11/17/2019 0909   K 3.7 11/17/2019 0909   CL 98 11/17/2019 0909   CO2 26 11/17/2019 0909   BUN 9 11/17/2019 0909   CREATININE 0.83 11/17/2019 0909      Component Value Date/Time   CALCIUM 9.4 11/17/2019 0909   ALKPHOS 121 11/17/2019 0909   AST 29 11/17/2019 0909   ALT 32 11/17/2019 0909   BILITOT 0.4 11/17/2019 0909       RADIOGRAPHIC STUDIES: CT Chest W Contrast  Result Date: 11/15/2019 CLINICAL DATA:  Lung cancer staging EXAM: CT CHEST, ABDOMEN, AND PELVIS WITH CONTRAST TECHNIQUE: Multidetector CT imaging of the chest, abdomen and pelvis was performed following the standard protocol during bolus administration of intravenous contrast. CONTRAST:  143mL OMNIPAQUE IOHEXOL 300 MG/ML SOLN, additional oral enteric contrast COMPARISON:  09/21/2019, 07/12/2019 FINDINGS: CT CHEST FINDINGS Cardiovascular: No significant vascular findings. Normal heart size. No pericardial effusion. Mediastinum/Nodes: Unchanged post treatment appearance of left hilar and AP window soft tissue thickening without discrete mediastinal lymphadenopathy. Left supraclavicular lymph nodes have decreased in size, now subcentimeter (series 2,  image 9). Thyroid gland, trachea, and esophagus demonstrate no significant findings. Lungs/Pleura: Unchanged trace left pleural effusion. Unchanged post treatment appearance of the left upper lobe with bronchial wall thickening, irregular opacity, nodularity, and architectural distortion. A ground-glass nodule of the posterior right upper lobe may have slightly increased in size measuring 1.3 cm, previously 0.9 cm (series 6, image 52). This lesion was new on examination dated 05/11/2019 but does not appear to have truly changed in size on multiple prior examinations in the interval when accounting for slice selection. Musculoskeletal: Unchanged sclerotic lesion of the T10 vertebral body (series 5, image 99). CT ABDOMEN PELVIS FINDINGS Hepatobiliary: No solid liver abnormality is seen. Hepatic steatosis. No gallstones, gallbladder wall thickening, or biliary dilatation. Pancreas: Previously seen pancreatic tail lesion remains resolved and inapparent. No evidence of recurrent lesion. No pancreatic ductal dilatation or surrounding inflammatory changes. Spleen: Normal in size without significant abnormality. Adrenals/Urinary Tract: Adrenal glands are unremarkable. Interval improvement in previously seen mild bilateral perinephric fat stranding. Bladder is unremarkable. Stomach/Bowel: Stomach is within normal limits. Appendix appears normal. No evidence of bowel wall thickening, distention, or  inflammatory changes. Vascular/Lymphatic: Aortic atherosclerosis. No enlarged abdominal or pelvic lymph nodes. Reproductive: No mass or other abnormality. Other: No abdominal wall hernia or abnormality. No abdominopelvic ascites. Musculoskeletal: Unchanged sclerotic lesion and superior endplate deformity of L5 (series 5, image 93). Unchanged, densely calcified or sclerotic lesion of the right femoral neck (series 4, image 101). IMPRESSION: 1. Unchanged post treatment appearance of the left upper lobe with bronchial wall thickening,  irregular opacity, nodularity, and architectural distortion. No new pulmonary mass or nodule. 2. Unchanged post treatment appearance of left hilar and AP window soft tissue thickening without discrete mediastinal lymphadenopathy. Left supraclavicular lymph nodes have decreased in size, now subcentimeter (series 2, image 9). 3. A ground-glass nodule of the posterior right upper lobe may have slightly increased in size measuring 1.3 cm, previously 0.9 cm (series 6, image 52). This lesion was new on examination dated 05/11/2019 but does not appear to have truly changed in size on multiple prior examinations in the interval when accounting for slice selection. Generally continue to favor incidental infection or inflammation although indolent synchronous malignancy is not excluded. Attention on follow-up. 4. Previously seen pancreatic tail lesion remains resolved and inapparent. No evidence of recurrent lesion or other metastatic disease in the abdomen or pelvis. 5. 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 No evidence of new osseous metastatic disease. Unchanged, densely calcified or sclerotic lesion of the right femoral neck (series 4, image 101), which remains most consistent with a benign enchondroma or other incidental benign lesion. 6. Interval improvement in previously seen mild bilateral perinephric fat stranding, nonspecific and infectious or inflammatory, differential considerations again including drug toxicity. 7.  Aortic Atherosclerosis (ICD10-I70.0). Electronically Signed   By: Eddie Candle M.D.   On: 11/15/2019 08:33   CT Abdomen Pelvis W Contrast  Result Date: 11/15/2019 CLINICAL DATA:  Lung cancer staging EXAM: CT CHEST, ABDOMEN, AND PELVIS WITH CONTRAST TECHNIQUE: Multidetector CT imaging of the chest, abdomen and pelvis was performed following the standard protocol during bolus administration of intravenous contrast. CONTRAST:  137mL OMNIPAQUE IOHEXOL 300 MG/ML SOLN, additional oral  enteric contrast COMPARISON:  09/21/2019, 07/12/2019 FINDINGS: CT CHEST FINDINGS Cardiovascular: No significant vascular findings. Normal heart size. No pericardial effusion. Mediastinum/Nodes: Unchanged post treatment appearance of left hilar and AP window soft tissue thickening without discrete mediastinal lymphadenopathy. Left supraclavicular lymph nodes have decreased in size, now subcentimeter (series 2, image 9). Thyroid gland, trachea, and esophagus demonstrate no significant findings. Lungs/Pleura: Unchanged trace left pleural effusion. Unchanged post treatment appearance of the left upper lobe with bronchial wall thickening, irregular opacity, nodularity, and architectural distortion. A ground-glass nodule of the posterior right upper lobe may have slightly increased in size measuring 1.3 cm, previously 0.9 cm (series 6, image 52). This lesion was new on examination dated 05/11/2019 but does not appear to have truly changed in size on multiple prior examinations in the interval when accounting for slice selection. Musculoskeletal: Unchanged sclerotic lesion of the T10 vertebral body (series 5, image 99). CT ABDOMEN PELVIS FINDINGS Hepatobiliary: No solid liver abnormality is seen. Hepatic steatosis. No gallstones, gallbladder wall thickening, or biliary dilatation. Pancreas: Previously seen pancreatic tail lesion remains resolved and inapparent. No evidence of recurrent lesion. No pancreatic ductal dilatation or surrounding inflammatory changes. Spleen: Normal in size without significant abnormality. Adrenals/Urinary Tract: Adrenal glands are unremarkable. Interval improvement in previously seen mild bilateral perinephric fat stranding. Bladder is unremarkable. Stomach/Bowel: Stomach is within normal limits. Appendix appears normal. No evidence of bowel wall thickening, distention, or  inflammatory changes. Vascular/Lymphatic: Aortic atherosclerosis. No enlarged abdominal or pelvic lymph nodes. Reproductive:  No mass or other abnormality. Other: No abdominal wall hernia or abnormality. No abdominopelvic ascites. Musculoskeletal: Unchanged sclerotic lesion and superior endplate deformity of L5 (series 5, image 93). Unchanged, densely calcified or sclerotic lesion of the right femoral neck (series 4, image 101). IMPRESSION: 1. Unchanged post treatment appearance of the left upper lobe with bronchial wall thickening, irregular opacity, nodularity, and architectural distortion. No new pulmonary mass or nodule. 2. Unchanged post treatment appearance of left hilar and AP window soft tissue thickening without discrete mediastinal lymphadenopathy. Left supraclavicular lymph nodes have decreased in size, now subcentimeter (series 2, image 9). 3. A ground-glass nodule of the posterior right upper lobe may have slightly increased in size measuring 1.3 cm, previously 0.9 cm (series 6, image 52). This lesion was new on examination dated 05/11/2019 but does not appear to have truly changed in size on multiple prior examinations in the interval when accounting for slice selection. Generally continue to favor incidental infection or inflammation although indolent synchronous malignancy is not excluded. Attention on follow-up. 4. Previously seen pancreatic tail lesion remains resolved and inapparent. No evidence of recurrent lesion or other metastatic disease in the abdomen or pelvis. 5. 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 No evidence of new osseous metastatic disease. Unchanged, densely calcified or sclerotic lesion of the right femoral neck (series 4, image 101), which remains most consistent with a benign enchondroma or other incidental benign lesion. 6. Interval improvement in previously seen mild bilateral perinephric fat stranding, nonspecific and infectious or inflammatory, differential considerations again including drug toxicity. 7.  Aortic Atherosclerosis (ICD10-I70.0). Electronically Signed   By: Eddie Candle  M.D.   On: 11/15/2019 08:33    ASSESSMENT AND PLAN: This is a very pleasant 58 years old African-American male with likely stage IV non-small cell lung cancer, adenocarcinoma presented with large left upper lobe lung mass in addition to mediastinal and left supraclavicular lymphadenopathy as well as suspicious bone metastasis and bilateral pulmonary nodules diagnosed in March 2020. The patient is currently undergoing systemic chemotherapy with carboplatin, Alimta and Keytruda status post 13 cycles.  Starting from cycle #5 he is receiving maintenance treatment with Alimta and Keytruda. He has been tolerating his treatment well with no concerning adverse effects. I recommended for him to proceed with cycle #14 today as planned. For the right shoulder pain, he will continue physical therapy and the patient will also continue his current treatment with oxycodone on as-needed basis. I will see the patient back for follow-up visit in 3 weeks for evaluation before the next cycle of his treatment. He was advised to call immediately if he has any concerning symptoms in the interval. The patient voices understanding of current disease status and treatment options and is in agreement with the current care plan.  All questions were answered. The patient knows to call the clinic with any problems, questions or concerns. We can certainly see the patient much sooner if necessary.   Disclaimer: This note was dictated with voice recognition software. Similar sounding words can inadvertently be transcribed and may not be corrected upon review.

## 2019-12-09 NOTE — Patient Instructions (Signed)
[facility] Discharge Instructions for Patients Receiving Chemotherapy  Today you received the following chemotherapy agents keytruda, alimta  To help prevent nausea and vomiting after your treatment, we encourage you to take your nausea medication.   If you develop nausea and vomiting that is not controlled by your nausea medication, call the clinic.   BELOW ARE SYMPTOMS THAT SHOULD BE REPORTED IMMEDIATELY:  *FEVER GREATER THAN 100.5 F  *CHILLS WITH OR WITHOUT FEVER  NAUSEA AND VOMITING THAT IS NOT CONTROLLED WITH YOUR NAUSEA MEDICATION  *UNUSUAL SHORTNESS OF BREATH  *UNUSUAL BRUISING OR BLEEDING  TENDERNESS IN MOUTH AND THROAT WITH OR WITHOUT PRESENCE OF ULCERS  *URINARY PROBLEMS  *BOWEL PROBLEMS  UNUSUAL RASH Items with * indicate a potential emergency and should be followed up as soon as possible.  Feel free to call the clinic should you have any questions or concerns. The clinic phone number is [phone].  Please show the [facility] at check-in to the Emergency Department and triage nurse.

## 2019-12-10 ENCOUNTER — Telehealth: Payer: Self-pay | Admitting: Internal Medicine

## 2019-12-10 NOTE — Telephone Encounter (Signed)
Scheduled per los. Called and left msg. Mailed printout  °

## 2019-12-13 ENCOUNTER — Other Ambulatory Visit: Payer: Self-pay | Admitting: Internal Medicine

## 2019-12-13 ENCOUNTER — Telehealth: Payer: Self-pay | Admitting: Medical Oncology

## 2019-12-13 DIAGNOSIS — C3492 Malignant neoplasm of unspecified part of left bronchus or lung: Secondary | ICD-10-CM

## 2019-12-13 DIAGNOSIS — M542 Cervicalgia: Secondary | ICD-10-CM

## 2019-12-13 MED ORDER — OXYCODONE-ACETAMINOPHEN 5-325 MG PO TABS: 1.0000 | ORAL_TABLET | Freq: Three times a day (TID) | ORAL | 0 refills | Status: DC | PRN

## 2019-12-13 NOTE — Telephone Encounter (Signed)
Refill request

## 2019-12-20 ENCOUNTER — Other Ambulatory Visit: Payer: Medicaid Other | Admitting: Internal Medicine

## 2019-12-20 DIAGNOSIS — Z515 Encounter for palliative care: Secondary | ICD-10-CM

## 2019-12-21 ENCOUNTER — Other Ambulatory Visit: Payer: Self-pay

## 2019-12-27 ENCOUNTER — Other Ambulatory Visit: Payer: Self-pay | Admitting: Internal Medicine

## 2019-12-28 NOTE — Progress Notes (Signed)
[facility] OFFICE PROGRESS NOTE  Willie Beard, MD [address] [place] [address]  DIAGNOSIS: Stage IV (2B, N3, M1 C) non-small cell lung cancer, adenocarcinoma presented with left upper lobe lung mass in addition to mediastinal and left supraclavicular lymphadenopathy as well as bilateral pulmonary nodules, metastatic disease to T10 vertebral body, and a suspicious  mass near the pancreatic tail diagnosed in March 2020.  Biomarker Findings Microsatellite status - MS-Stable Tumor Mutational Burden - 5 Muts/Mb Genomic Findings For a complete list of the genes assayed, please refer to the Appendix. KRAS G12V CDKN2A/B p16INK4a R80* and T02IOX B35H CHEK1 splice site 299+2E>Q AS34 R273L 7 Disease relevant genes with no reportable alterations: ALK, BRAF, EGFR, ERBB2, MET, RET, ROS1   PRIOR THERAPY:  None   CURRENT THERAPY: Systemic chemotherapy with carboplatin for AUC of 5, Alimta 500 mg/M2 and Keytruda 200 mg IV every 3 weeks. First dose March20, 2020.Starting from cycle #5 the patient has been on maintenance Keytruda and Alimta. Status post14cycles.  INTERVAL HISTORY: Willie Schmidt 59 y.o. male returns to the clinic for a follow up visit. The patient is feeling well today without any concerning complaints except continued right shoulder pain for which he is undergoing physical therapy. He also is scheduled to see his orthopedic physician next week. The patient continues to tolerate treatment with Keytruda and Alimta well without any concerning adverse side effects. Denies any fever, chills, night sweats, or weight loss. Denies any chest pain, shortness of breath, cough, or hemoptysis. He occasionally experiences wheezing. Denies any nausea, vomiting, diarrhea, or constipation. Denies any headache or visual changes. Denies any rashes or skin changes. The patient is here today for evaluation prior to starting cycle # 15   MEDICAL HISTORY: Past Medical History:   Diagnosis Date  . Bronchitis, chronic ([place])   . Hypertension   . metastatic lung ca dx'd 02/2019   lyphadenopathy, bil pulmonary noduels; suspected bone lesions    ALLERGIES:  has No Known Allergies.  MEDICATIONS:  Current Outpatient Medications  Medication Sig Dispense Refill  . albuterol (PROVENTIL HFA;VENTOLIN HFA) 108 (90 BASE) MCG/ACT inhaler Inhale 2 puffs into the lungs every 6 (six) hours as needed for wheezing. 1 Inhaler 2  . amLODipine (NORVASC) 5 MG tablet TK 1 T PO Q NIGHT    . aspirin 81 MG tablet Take 81 mg by mouth daily.    . cyclobenzaprine (FLEXERIL) 10 MG tablet TAKE 1 TABLET BY MOUTH THREE TIMES DAILY AS NEEDED FOR MUSCLE SPASMS 30 tablet 0  . DULoxetine (CYMBALTA) 30 MG capsule 1 ONCE DAILY FOR 1 WEEK THEN INCREASE TO 1 TWICE DAILY 60 capsule 3  . folic acid (FOLVITE) 1 MG tablet TAKE 1 TABLET(1 MG) BY MOUTH DAILY 30 tablet 4  . furosemide (LASIX) 20 MG tablet 20 mg PO AM x 3 days then stop. Repeat if edema recurs 30 tablet 1  . gabapentin (NEURONTIN) 300 MG capsule Take 1 capsule (300 mg total) by mouth 3 (three) times daily. 90 capsule 3  . indomethacin (INDOCIN) 50 MG capsule Take 1 capsule (50 mg total) by mouth 3 (three) times daily with meals. 12 capsule 0  . lisinopril-hydrochlorothiazide (ZESTORETIC) 20-25 MG tablet Take 1 tablet by mouth daily.    . meloxicam (MOBIC) 15 MG tablet TK 1 T PO QD WF    . oxyCODONE-acetaminophen (PERCOCET/ROXICET) 5-325 MG tablet Take 1 tablet by mouth every 8 (eight) hours as needed for severe pain. 30 tablet 0  .  potassium chloride SA (KLOR-CON) 20 MEQ tablet 1 Tablet once daily while taking Lasix 30 tablet 1  . prochlorperazine (COMPAZINE) 10 MG tablet TAKE 1 TABLET(10 MG) BY MOUTH EVERY 6 HOURS AS NEEDED FOR NAUSEA OR VOMITING 30 tablet 0  . sulfamethoxazole-trimethoprim (BACTRIM DS) 800-160 MG tablet Take 1 tablet by mouth 2 (two) times daily. 14 tablet 0   No current facility-administered medications for this visit.     SURGICAL HISTORY: No past surgical history on file.  REVIEW OF SYSTEMS:   Review of Systems  Constitutional: Negative for appetite change, chills, fatigue, fever and unexpected weight change.  HENT:   Negative for mouth sores, nosebleeds, sore throat and trouble swallowing.   Eyes: Negative for eye problems and icterus.  Respiratory: Negative for cough, hemoptysis, shortness of breath and wheezing.  Cardiovascular: Negative for chest pain and leg swelling.  Gastrointestinal: Negative for abdominal pain, constipation, diarrhea, nausea and vomiting.  Genitourinary: Negative for bladder incontinence, difficulty urinating, dysuria, frequency and hematuria.   Musculoskeletal: Positive for right shoulder/neck pain. Negative for back pain.  Skin: Negative for itching and rash.  Neurological: Negative for dizziness, extremity weakness, gait problem, headaches, light-headedness and seizures.  Hematological: Negative for adenopathy. Does not bruise/bleed easily.  Psychiatric/Behavioral: Negative for confusion, depression and sleep disturbance. The patient is not nervous/anxious.     PHYSICAL EXAMINATION:  There were no vitals taken for this visit.  ECOG PERFORMANCE STATUS: 1 - Symptomatic but completely ambulatory  Physical Exam  Constitutional: Oriented to person, place, and time and well-developed, well-nourished, and in no distress. No distress.  HENT:  Head: Normocephalic and atraumatic.  Mouth/Throat: Oropharynx is clear and moist. No oropharyngeal exudate.  Eyes: Conjunctivae are normal. Right eye exhibits no discharge. Left eye exhibits no discharge. No scleral icterus.  Neck: Normal range of motion. Neck supple.  Cardiovascular: Normal rate, regular rhythm, normal heart sounds and intact distal pulses.   Pulmonary/Chest: Effort normal and breath sounds normal. No respiratory distress. No wheezes. No rales.  Abdominal: Soft. Bowel sounds are normal. Exhibits no distension and no  mass. There is no tenderness.  Musculoskeletal: Normal range of motion. Exhibits no edema.  Lymphadenopathy:    No cervical adenopathy.  Neurological: Alert and oriented to person, place, and time. Exhibits normal muscle tone. Gait normal. Coordination normal.  Skin: Skin is warm and dry. No rash noted. Not diaphoretic. No erythema. No pallor.  Psychiatric: Mood, memory and judgment normal.  Vitals reviewed.  LABORATORY DATA: Lab Results  Component Value Date   WBC 8.9 12/09/2019   HGB 11.9 (L) 12/09/2019   HCT 36.6 (L) 12/09/2019   MCV 99.2 12/09/2019   PLT 290 12/09/2019      Chemistry      Component Value Date/Time   NA 134 (L) 12/09/2019 0921   K 3.5 12/09/2019 0921   CL 95 (L) 12/09/2019 0921   CO2 26 12/09/2019 0921   BUN 10 12/09/2019 0921   CREATININE 0.81 12/09/2019 0921      Component Value Date/Time   CALCIUM 9.0 12/09/2019 0921   ALKPHOS 115 12/09/2019 0921   AST 28 12/09/2019 0921   ALT 27 12/09/2019 0921   BILITOT 0.5 12/09/2019 0921       RADIOGRAPHIC STUDIES:  No results found.   ASSESSMENT/PLAN:  This is a very pleasant 59 year old African-American male with stage IV non-small cell lung cancer, adenocarcinoma. He presented with a large left upper lobe lung mass with mediastinal andleft supraclavicular lymphadenopathy as well as bilateral pulmonary  nodules. He was also found to have metastatic disease to the T10 vertebral body.His brain MRI was negative for any metastatic disease. He was diagnosed in March 2020.   The patient is currently undergoing treatment with carboplatin for an AUC of 5, Alimta 500 mg/m, and Keytruda 200 mg IV every 3 weeks. He is status post14cycles. Starting from cycle #5, the patient has been receiving maintenance treatment with Keytruda 200 mg IV and Alimta 500 mg/m IV every 3 weeks.   Labs were reviewed. Recommend that he proceed with cycle #15 today as scheduled.   I will arrange for a restaging CT scan to be  performed prior to his next visit in 3 weeks.   We will see him back for a follow up visit in 3 weeks for evaluation and to review his scan results before starting cycle #16.   The patient was advised to call immediately if he has any concerning symptoms in the interval. The patient voices understanding of current disease status and treatment options and is in agreement with the current care plan. All questions were answered. The patient knows to call the clinic with any problems, questions or concerns. We can certainly see the patient much sooner if necessary    No orders of the defined types were placed in this encounter.    Zhavia Cunanan L Helene Bernstein, PA-C 12/28/19

## 2019-12-29 ENCOUNTER — Other Ambulatory Visit: Payer: Self-pay

## 2019-12-29 ENCOUNTER — Inpatient Hospital Stay: Payer: Medicaid Other

## 2019-12-29 ENCOUNTER — Inpatient Hospital Stay: Payer: Medicaid Other | Attending: Internal Medicine | Admitting: Physician Assistant

## 2019-12-29 VITALS — BP 141/93 | HR 103 | Temp 98.0°F | Resp 18 | Ht 69.0 in | Wt 253.9 lb

## 2019-12-29 DIAGNOSIS — Z79899 Other long term (current) drug therapy: Secondary | ICD-10-CM | POA: Insufficient documentation

## 2019-12-29 DIAGNOSIS — Z5112 Encounter for antineoplastic immunotherapy: Secondary | ICD-10-CM

## 2019-12-29 DIAGNOSIS — Z5111 Encounter for antineoplastic chemotherapy: Secondary | ICD-10-CM

## 2019-12-29 DIAGNOSIS — C3492 Malignant neoplasm of unspecified part of left bronchus or lung: Secondary | ICD-10-CM | POA: Diagnosis not present

## 2019-12-29 DIAGNOSIS — C3412 Malignant neoplasm of upper lobe, left bronchus or lung: Secondary | ICD-10-CM | POA: Diagnosis present

## 2019-12-29 DIAGNOSIS — C7951 Secondary malignant neoplasm of bone: Secondary | ICD-10-CM | POA: Insufficient documentation

## 2019-12-29 LAB — CMP (CANCER CENTER ONLY)
ALT: 35 U/L (ref 0–44)
AST: 34 U/L (ref 15–41)
Albumin: 4.2 g/dL (ref 3.5–5.0)
Alkaline Phosphatase: 119 U/L (ref 38–126)
Anion gap: 13 (ref 5–15)
BUN: 10 mg/dL (ref 6–20)
CO2: 25 mmol/L (ref 22–32)
Calcium: 9.4 mg/dL (ref 8.9–10.3)
Chloride: 99 mmol/L (ref 98–111)
Creatinine: 0.84 mg/dL (ref 0.61–1.24)
GFR, Est AFR Am: >60 mL/min (ref >60)
GFR, Estimated: >60 mL/min (ref >60)
Glucose, Bld: 103 mg/dL — ABNORMAL HIGH (ref 70–99)
Potassium: 3.7 mmol/L (ref 3.5–5.1)
Sodium: 137 mmol/L (ref 135–145)
Total Bilirubin: 0.6 mg/dL (ref 0.3–1.2)
Total Protein: 8.8 g/dL — ABNORMAL HIGH (ref 6.5–8.1)

## 2019-12-29 LAB — CBC WITH DIFFERENTIAL (CANCER CENTER ONLY)
Abs Immature Granulocytes: 0.02 10*3/uL (ref 0.00–0.07)
Basophils Absolute: 0 10*3/uL (ref 0.0–0.1)
Basophils Relative: 0 %
Eosinophils Absolute: 0.3 10*3/uL (ref 0.0–0.5)
Eosinophils Relative: 4 %
HCT: 37.9 % — ABNORMAL LOW (ref 39.0–52.0)
Hemoglobin: 12.2 g/dL — ABNORMAL LOW (ref 13.0–17.0)
Immature Granulocytes: 0 %
Lymphocytes Relative: 32 %
Lymphs Abs: 2.9 10*3/uL (ref 0.7–4.0)
MCH: 32.6 pg (ref 26.0–34.0)
MCHC: 32.2 g/dL (ref 30.0–36.0)
MCV: 101.3 fL — ABNORMAL HIGH (ref 80.0–100.0)
Monocytes Absolute: 1 10*3/uL (ref 0.1–1.0)
Monocytes Relative: 11 %
Neutro Abs: 4.8 10*3/uL (ref 1.7–7.7)
Neutrophils Relative %: 53 %
Platelet Count: 323 10*3/uL (ref 150–400)
RBC: 3.74 MIL/uL — ABNORMAL LOW (ref 4.22–5.81)
RDW: 14.4 % (ref 11.5–15.5)
WBC Count: 9.1 10*3/uL (ref 4.0–10.5)
nRBC: 0 % (ref 0.0–0.2)

## 2019-12-29 MED ORDER — PROCHLORPERAZINE MALEATE 10 MG PO TABS
10.0000 mg | ORAL_TABLET | Freq: Once | ORAL | Status: AC
  Administered 2019-12-29: 10 mg via ORAL

## 2019-12-29 MED ORDER — SODIUM CHLORIDE 0.9 % IV SOLN
200.0000 mg | Freq: Once | INTRAVENOUS | Status: AC
  Administered 2019-12-29: 200 mg via INTRAVENOUS
  Filled 2019-12-29: qty 8

## 2019-12-29 MED ORDER — PROCHLORPERAZINE MALEATE 10 MG PO TABS
ORAL_TABLET | ORAL | Status: AC
  Filled 2019-12-29: qty 1

## 2019-12-29 MED ORDER — SODIUM CHLORIDE 0.9 % IV SOLN
500.0000 mg/m2 | Freq: Once | INTRAVENOUS | Status: AC
  Administered 2019-12-29: 1100 mg via INTRAVENOUS
  Filled 2019-12-29: qty 40

## 2019-12-29 MED ORDER — SODIUM CHLORIDE 0.9 % IV SOLN
Freq: Once | INTRAVENOUS | Status: AC
  Filled 2019-12-29: qty 250

## 2019-12-29 NOTE — Patient Instructions (Signed)
[facility] Discharge Instructions for Patients Receiving Chemotherapy  Today you received the following chemotherapy agents:  Pembrolizumab Beryle Flock), Pemetrexed (Alimta)  To help prevent nausea and vomiting after your treatment, we encourage you to take your nausea medication as prescribed.   If you develop nausea and vomiting that is not controlled by your nausea medication, call the clinic.   BELOW ARE SYMPTOMS THAT SHOULD BE REPORTED IMMEDIATELY:  *FEVER GREATER THAN 100.5 F  *CHILLS WITH OR WITHOUT FEVER  NAUSEA AND VOMITING THAT IS NOT CONTROLLED WITH YOUR NAUSEA MEDICATION  *UNUSUAL SHORTNESS OF BREATH  *UNUSUAL BRUISING OR BLEEDING  TENDERNESS IN MOUTH AND THROAT WITH OR WITHOUT PRESENCE OF ULCERS  *URINARY PROBLEMS  *BOWEL PROBLEMS  UNUSUAL RASH Items with * indicate a potential emergency and should be followed up as soon as possible.  Feel free to call the clinic should you have any questions or concerns. The clinic phone number is [phone].  Please show the [employer] at check-in to the Emergency Department and triage nurse.

## 2019-12-29 NOTE — Progress Notes (Signed)
Per DR Julien Nordmann, OK to proceed with treatment with HR > 100

## 2019-12-31 ENCOUNTER — Telehealth: Payer: Self-pay | Admitting: Internal Medicine

## 2019-12-31 NOTE — Telephone Encounter (Signed)
Scheduled per los. Called and left msg. Mailed printout  °

## 2020-01-05 ENCOUNTER — Telehealth: Payer: Self-pay | Admitting: Internal Medicine

## 2020-01-05 ENCOUNTER — Telehealth: Payer: Self-pay

## 2020-01-05 NOTE — Telephone Encounter (Signed)
Phoned patient for routine follow-up.  No return call from previous phone calls to patient.  Voicemail message left for patient to return call to this NP.  Plan on additional follow-up to reschedule a palliative visit.                                                 Willie Schmidt

## 2020-01-05 NOTE — Telephone Encounter (Signed)
TC from [facility] at [facility] stating that Pt is refusing Palliative Care services at this time.

## 2020-01-07 ENCOUNTER — Telehealth: Payer: Self-pay | Admitting: Medical Oncology

## 2020-01-07 ENCOUNTER — Other Ambulatory Visit: Payer: Self-pay | Admitting: Physician Assistant

## 2020-01-07 ENCOUNTER — Ambulatory Visit: Payer: Medicaid Other | Admitting: Orthopedic Surgery

## 2020-01-07 DIAGNOSIS — C3492 Malignant neoplasm of unspecified part of left bronchus or lung: Secondary | ICD-10-CM

## 2020-01-07 DIAGNOSIS — M542 Cervicalgia: Secondary | ICD-10-CM

## 2020-01-07 MED ORDER — OXYCODONE-ACETAMINOPHEN 5-325 MG PO TABS: 1.0000 | ORAL_TABLET | Freq: Three times a day (TID) | ORAL | 0 refills | Status: DC | PRN

## 2020-01-07 NOTE — Telephone Encounter (Signed)
Refill pain med requested.

## 2020-01-10 ENCOUNTER — Telehealth: Payer: Self-pay | Admitting: *Deleted

## 2020-01-10 NOTE — Progress Notes (Signed)
    [facility] Consult Note Telephone: [phone]  Fax: [phone]  PATIENT NAME: AKASH WINSKI DOB: Nov 12, 1961 MRN: [id number]  NOTE:  Phoned patient at listed telephone number for previously scheduled appointment.  No answer and received a voicemail message.  I left a message for patient to return the call to this NP to complete or reschedule today's appointment.  Palliative care will follow-up with patient in the near future.  Gonzella Lex, NP-C

## 2020-01-10 NOTE — Progress Notes (Signed)
    [facility] Consult Note Telephone: [phone]  Fax: [phone]  PATIENT NAME: Willie Schmidt DOB: 04/07/1961 MRN: [id number]  NOTE:  Previously scheduled Palliative appointment was cancelled by daughter Willie Schmidt today.  Patient has a evaluation appointment related to leg pain.  F/U appointment will be scheduled at a later date.  Support given.  Gonzella Lex, NP-C

## 2020-01-10 NOTE — Telephone Encounter (Signed)
Received very brief vm message from patient stating that he has fluid building up in his feet.  Attempted call back. No answer on his home phone. VM message left for pt to return the call tomorrow morning. Called pt's mobile phone as well and left message for him to call back in the morning.

## 2020-01-10 NOTE — Progress Notes (Signed)
[facility] Consult Note Telephone: [phone]  Fax: [phone]  PATIENT NAME: Willie Schmidt DOB: [birth year] MRN: [id number]  PRIMARY CARE PROVIDER:   [name], MD  REFERRING PROVIDER:  Dr. Lorna Schmidt  RESPONSIBLE PARTY:   Self and Willie Schmidt(daughter)/POA     RECOMMENDATIONS and PLAN:   Palliative Care Encounter Z51.5  1. Advance care planning:  Introduction of Palliative and comfort care to patient via phone.   Reviewed goals of care and advanced directives .  Patient expressed that he would like to continue therapies by Oncologist and understands that the lung cancer is not curable.  His focus is on improvement or maintain quality of life. Reviewed advanced directives which will be addressed during the next visit. Palliative care will follow patient related to his needs and symptom management.  Patient agreed to an in-person visit on 09/30/19.  Daughter Willie informed and plans on attending next visit.  Another daughter may attend next visit as well.   2.  Shortness of breath:  Related to lung cancer.  Intermittent and improved with rest. Schedule rest breaks.  Limit sodium in meals.  3.  Right shoulder pain:  Recent MRI performed.  Pending results. Potential for new site of bone metastasis. Topical and oral prescription analgesics as needed for now.  Additional discussion after return of results.  Due to the COVID-19 crisis, this visit was consented and initiated by patient and/or family.   I spent 30 minutes providing this consultation,  from 1400 to 1430.  More than 50% of the time in this consultation was spent coordinating communication.   HISTORY OF PRESENT ILLNESS:  Willie Schmidt is a 59 y.o. year old male with medical problems including metastatic lung cancer, Schizophrenia and is currently receiving palliative treatment for same with Oncology.  He lives at home and is independent with performing his ADLs.  Daughters and male friend assist with needs.  Palliative Care was asked to help address goals of care.   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 Bronchitis, chronic ([place])   . Hypertension   . metastatic lung ca dx'd 02/2019   lyphadenopathy, bil pulmonary noduels; suspected bone lesions   .Schizophrenia  .Anxiety SOCIAL HX: Daily ETOH use  ALLERGIES: No Known Allergies    PERTINENT MEDICATIONS:  Outpatient Encounter Medications as of 09/02/2019  Medication Sig  . albuterol (PROVENTIL HFA;VENTOLIN HFA) 108 (90 BASE) MCG/ACT inhaler Inhale 2 puffs into the lungs every 6 (six) hours as needed for wheezing. (Patient not taking: Reported on 03/03/2019)  . amLODipine (NORVASC) 5 MG tablet TK 1 T PO Q NIGHT  . aspirin 81 MG tablet Take 81 mg by mouth daily.  . DULoxetine (CYMBALTA) 30 MG capsule 1 once daily for 1 week then increase to 1 twice daily  . folic acid (FOLVITE) 1 MG tablet TAKE 1 TABLET(1 MG) BY MOUTH DAILY  . gabapentin (NEURONTIN) 300 MG capsule Take 1 capsule (300 mg total) by mouth 3 (three) times daily.  . meloxicam (MOBIC) 15 MG tablet TK 1 T PO QD WF  . prochlorperazine (COMPAZINE) 10 MG tablet TAKE 1 TABLET(10 MG) BY MOUTH EVERY 6 HOURS AS NEEDED FOR NAUSEA OR VOMITING  . [DISCONTINUED] cyclobenzaprine (FLEXERIL) 10 MG tablet TAKE 1 TABLET BY MOUTH THREE TIMES A DAY AS NEEDED FOR  MUSCLE SPASMS  . [DISCONTINUED] oxyCODONE-acetaminophen (PERCOCET/ROXICET) 5-325 MG tablet Take 1 tablet by mouth every 8 (eight) hours as needed for severe pain.   No facility-administered encounter medications on file as of 09/02/2019.     PHYSICAL EXAM:  GEN:  No acute distress PULM:  Speech is not forced NEURO:  Alert and oriented.  Speech is fluid. PSYCH:  Cooperative, engaged in conversation  [place],  NP-C

## 2020-01-12 ENCOUNTER — Telehealth: Payer: Self-pay | Admitting: Medical Oncology

## 2020-01-12 NOTE — Telephone Encounter (Signed)
Instructed to keep legs elevated.

## 2020-01-12 NOTE — Telephone Encounter (Signed)
Left Lower extremity swelling persists . He is using Epsom salts , dry off leg and rub alcohol on his leg.  He is taking Lasix 3 x week. Sharp pain anteriorly up toward knee. Denies redness or warmth to the leg.  CT abd next monday

## 2020-01-12 NOTE — Telephone Encounter (Signed)
No. Thank you.

## 2020-01-17 ENCOUNTER — Ambulatory Visit (HOSPITAL_COMMUNITY)
Admission: RE | Admit: 2020-01-17 | Discharge: 2020-01-17 | Disposition: A | Discharge status: Home / Self Care | Payer: Medicaid Other | Source: Ambulatory Visit | Attending: Physician Assistant | Admitting: Physician Assistant

## 2020-01-17 ENCOUNTER — Other Ambulatory Visit: Payer: Self-pay

## 2020-01-17 DIAGNOSIS — C3492 Malignant neoplasm of unspecified part of left bronchus or lung: Secondary | ICD-10-CM | POA: Diagnosis not present

## 2020-01-17 MED ORDER — IOHEXOL 300 MG/ML  SOLN
75.0000 mL | Freq: Once | INTRAMUSCULAR | Status: AC | PRN
  Administered 2020-01-17: 75 mL via INTRAVENOUS

## 2020-01-17 MED ORDER — SODIUM CHLORIDE (PF) 0.9 % IJ SOLN
INTRAMUSCULAR | Status: AC
  Filled 2020-01-17: qty 50

## 2020-01-19 ENCOUNTER — Inpatient Hospital Stay (HOSPITAL_BASED_OUTPATIENT_CLINIC_OR_DEPARTMENT_OTHER): Payer: Medicaid Other | Admitting: Internal Medicine

## 2020-01-19 ENCOUNTER — Encounter: Payer: Self-pay | Admitting: Internal Medicine

## 2020-01-19 ENCOUNTER — Inpatient Hospital Stay: Payer: Medicaid Other

## 2020-01-19 ENCOUNTER — Other Ambulatory Visit: Payer: Self-pay

## 2020-01-19 ENCOUNTER — Telehealth: Payer: Self-pay | Admitting: Medical Oncology

## 2020-01-19 ENCOUNTER — Encounter (HOSPITAL_COMMUNITY): Payer: Medicaid Other

## 2020-01-19 ENCOUNTER — Ambulatory Visit (HOSPITAL_COMMUNITY)
Admission: RE | Admit: 2020-01-19 | Discharge: 2020-01-19 | Disposition: A | Discharge status: Home / Self Care | Payer: Medicaid Other | Source: Ambulatory Visit | Attending: Internal Medicine | Admitting: Internal Medicine

## 2020-01-19 ENCOUNTER — Other Ambulatory Visit: Payer: Self-pay | Admitting: Internal Medicine

## 2020-01-19 VITALS — BP 140/102 | HR 109 | Temp 98.2°F | Resp 20 | Ht 69.0 in | Wt 259.5 lb

## 2020-01-19 VITALS — BP 148/111 | HR 108 | Resp 18

## 2020-01-19 DIAGNOSIS — C3492 Malignant neoplasm of unspecified part of left bronchus or lung: Secondary | ICD-10-CM

## 2020-01-19 DIAGNOSIS — Z5112 Encounter for antineoplastic immunotherapy: Secondary | ICD-10-CM | POA: Diagnosis not present

## 2020-01-19 DIAGNOSIS — R600 Localized edema: Secondary | ICD-10-CM

## 2020-01-19 LAB — CMP (CANCER CENTER ONLY)
ALT: 24 U/L (ref 0–44)
AST: 25 U/L (ref 15–41)
Albumin: 4.3 g/dL (ref 3.5–5.0)
Alkaline Phosphatase: 114 U/L (ref 38–126)
Anion gap: 11 (ref 5–15)
BUN: 9 mg/dL (ref 6–20)
CO2: 28 mmol/L (ref 22–32)
Calcium: 9.6 mg/dL (ref 8.9–10.3)
Chloride: 98 mmol/L (ref 98–111)
Creatinine: 0.84 mg/dL (ref 0.61–1.24)
GFR, Est AFR Am: >60 mL/min (ref >60)
GFR, Estimated: >60 mL/min (ref >60)
Glucose, Bld: 90 mg/dL (ref 70–99)
Potassium: 3.8 mmol/L (ref 3.5–5.1)
Sodium: 137 mmol/L (ref 135–145)
Total Bilirubin: 0.4 mg/dL (ref 0.3–1.2)
Total Protein: 8.8 g/dL — ABNORMAL HIGH (ref 6.5–8.1)

## 2020-01-19 LAB — CBC WITH DIFFERENTIAL (CANCER CENTER ONLY)
Abs Immature Granulocytes: 0.01 10*3/uL (ref 0.00–0.07)
Basophils Absolute: 0 10*3/uL (ref 0.0–0.1)
Basophils Relative: 0 %
Eosinophils Absolute: 0.4 10*3/uL (ref 0.0–0.5)
Eosinophils Relative: 4 %
HCT: 41 % (ref 39.0–52.0)
Hemoglobin: 13.1 g/dL (ref 13.0–17.0)
Immature Granulocytes: 0 %
Lymphocytes Relative: 29 %
Lymphs Abs: 2.9 10*3/uL (ref 0.7–4.0)
MCH: 32.3 pg (ref 26.0–34.0)
MCHC: 32 g/dL (ref 30.0–36.0)
MCV: 101.2 fL — ABNORMAL HIGH (ref 80.0–100.0)
Monocytes Absolute: 1.2 10*3/uL — ABNORMAL HIGH (ref 0.1–1.0)
Monocytes Relative: 12 %
Neutro Abs: 5.5 10*3/uL (ref 1.7–7.7)
Neutrophils Relative %: 55 %
Platelet Count: 357 10*3/uL (ref 150–400)
RBC: 4.05 MIL/uL — ABNORMAL LOW (ref 4.22–5.81)
RDW: 13.2 % (ref 11.5–15.5)
WBC Count: 10.1 10*3/uL (ref 4.0–10.5)
nRBC: 0 % (ref 0.0–0.2)

## 2020-01-19 LAB — TSH: TSH: 1.551 u[IU]/mL (ref 0.320–4.118)

## 2020-01-19 MED ORDER — PROCHLORPERAZINE MALEATE 10 MG PO TABS
ORAL_TABLET | ORAL | Status: AC
  Filled 2020-01-19: qty 1

## 2020-01-19 MED ORDER — FUROSEMIDE 20 MG PO TABS: ORAL_TABLET | ORAL | 0 refills | Status: DC

## 2020-01-19 MED ORDER — FUROSEMIDE 20 MG PO TABS: 20.0000 mg | ORAL_TABLET | Freq: Every day | ORAL | 0 refills | Status: DC

## 2020-01-19 MED ORDER — SODIUM CHLORIDE 0.9 % IV SOLN
Freq: Once | INTRAVENOUS | Status: AC
  Filled 2020-01-19: qty 250

## 2020-01-19 MED ORDER — SODIUM CHLORIDE 0.9 % IV SOLN
200.0000 mg | Freq: Once | INTRAVENOUS | Status: AC
  Administered 2020-01-19: 11:00:00 200 mg via INTRAVENOUS
  Filled 2020-01-19: qty 8

## 2020-01-19 MED ORDER — SODIUM CHLORIDE 0.9 % IV SOLN
500.0000 mg/m2 | Freq: Once | INTRAVENOUS | Status: AC
  Administered 2020-01-19: 1100 mg via INTRAVENOUS
  Filled 2020-01-19: qty 40

## 2020-01-19 MED ORDER — PROCHLORPERAZINE MALEATE 10 MG PO TABS
10.0000 mg | ORAL_TABLET | Freq: Once | ORAL | Status: AC
  Administered 2020-01-19: 11:00:00 10 mg via ORAL

## 2020-01-19 NOTE — Patient Instructions (Signed)
[facility] Discharge Instructions for Patients Receiving Chemotherapy  Today you received the following chemotherapy agents Keytruda and Alimta. To help prevent nausea and vomiting after your treatment, we encourage you to take your nausea medication as directed.   If you develop nausea and vomiting that is not controlled by your nausea medication, call the clinic.   BELOW ARE SYMPTOMS THAT SHOULD BE REPORTED IMMEDIATELY:  *FEVER GREATER THAN 100.5 F  *CHILLS WITH OR WITHOUT FEVER  NAUSEA AND VOMITING THAT IS NOT CONTROLLED WITH YOUR NAUSEA MEDICATION  *UNUSUAL SHORTNESS OF BREATH  *UNUSUAL BRUISING OR BLEEDING  TENDERNESS IN MOUTH AND THROAT WITH OR WITHOUT PRESENCE OF ULCERS  *URINARY PROBLEMS  *BOWEL PROBLEMS  UNUSUAL RASH Items with * indicate a potential emergency and should be followed up as soon as possible.  Feel free to call the clinic you have any questions or concerns. The clinic phone number is [phone].  Please show the [facility] at check-in to the Emergency Department and triage nurse.

## 2020-01-19 NOTE — Progress Notes (Signed)
Lower extremity venous has been completed.   Preliminary results in CV Proc.   Abram Sander 01/19/2020 2:32 PM

## 2020-01-19 NOTE — Progress Notes (Signed)
OK to treat with VS today per MD [facility]

## 2020-01-19 NOTE — Telephone Encounter (Signed)
-----   Message from Curt Bears, MD sent at 01/19/2020  2:51 PM EST ----- No DVT seen on the Doppler.We will start him on Lasix 20 mg p.o. daily for 7 days.  I will send the prescription to his pharmacy.  Please let them know. ----- Message ----- From: Interface, [employer] Sent: 01/19/2020   2:32 PM EST To: Curt Bears, MD

## 2020-01-19 NOTE — Progress Notes (Signed)
[place] [phone]   [phone]  OFFICE PROGRESS NOTE  [name], [address] [place] [address]  DIAGNOSIS: Stage IV (T2b, N3, M1 C) non-small cell lung cancer, adenocarcinoma presented with left upper lobe lung mass in addition to mediastinal and left supraclavicular lymphadenopathy as well as bilateral pulmonary nodules and suspicious bone lesion diagnosed in March 2020.  PRIOR THERAPY: None  CURRENT THERAPY: Systemic chemotherapy with carboplatin for AUC of 5, Alimta 500 mg/M2 and Keytruda 200 mg IV every 3 weeks.  First dose March 11, 2019.  Status post 15 cycles.  Starting from cycle #5 the patient is on maintenance treatment with Alimta and Keytruda every 3 weeks.  INTERVAL HISTORY: Willie Schmidt 59 y.o. male returns to the clinic today for follow-up visit accompanied by his daughter.  The patient is feeling fine today with no concerning complaints except for swelling and pain in the left lower extremity.  He denied having any current chest pain, shortness of breath, cough or hemoptysis.  He denied having any fever or chills.  He has no nausea, vomiting, diarrhea or constipation.  He denied having any headache or visual changes.  He has been tolerating his treatment with Alimta and Keytruda fairly well.  The patient had repeat CT scan of the chest, abdomen pelvis performed recently and he is here for evaluation and discussion of his discuss results.  MEDICAL HISTORY: Past Medical History:  Diagnosis Date  . Bronchitis, chronic ([place])   . Hypertension   . metastatic lung ca dx'd 02/2019   lyphadenopathy, bil pulmonary noduels; suspected bone lesions    ALLERGIES:  has No Known Allergies.  MEDICATIONS:  Current Outpatient Medications  Medication Sig Dispense Refill  . albuterol (PROVENTIL HFA;VENTOLIN HFA) 108 (90 BASE) MCG/ACT inhaler Inhale 2 puffs into the lungs every 6 (six) hours as needed for wheezing. 1 Inhaler 2    . amLODipine (NORVASC) 5 MG tablet TK 1 T PO Q NIGHT    . aspirin 81 MG tablet Take 81 mg by mouth daily.    . cyclobenzaprine (FLEXERIL) 10 MG tablet TAKE 1 TABLET BY MOUTH THREE TIMES DAILY AS NEEDED FOR MUSCLE SPASMS 30 tablet 0  . DULoxetine (CYMBALTA) 30 MG capsule 1 ONCE DAILY FOR 1 WEEK THEN INCREASE TO 1 TWICE DAILY 60 capsule 3  . folic acid (FOLVITE) 1 MG tablet TAKE 1 TABLET(1 MG) BY MOUTH DAILY 30 tablet 4  . furosemide (LASIX) 20 MG tablet 20 mg PO AM x 3 days then stop. Repeat if edema recurs 30 tablet 1  . gabapentin (NEURONTIN) 300 MG capsule Take 1 capsule (300 mg total) by mouth 3 (three) times daily. 90 capsule 3  . indomethacin (INDOCIN) 50 MG capsule Take 1 capsule (50 mg total) by mouth 3 (three) times daily with meals. 12 capsule 0  . lisinopril-hydrochlorothiazide (ZESTORETIC) 20-25 MG tablet Take 1 tablet by mouth daily.    . meloxicam (MOBIC) 15 MG tablet TK 1 T PO QD WF    . oxyCODONE-acetaminophen (PERCOCET/ROXICET) 5-325 MG tablet Take 1 tablet by mouth every 8 (eight) hours as needed for severe pain. 30 tablet 0  . potassium chloride SA (KLOR-CON) 20 MEQ tablet 1 Tablet once daily while taking Lasix 30 tablet 1  . prochlorperazine (COMPAZINE) 10 MG tablet TAKE 1 TABLET(10 MG) BY MOUTH EVERY 6 HOURS AS NEEDED FOR NAUSEA OR VOMITING 30 tablet 0   No current facility-administered medications for  this visit.    SURGICAL HISTORY: History reviewed. No pertinent surgical history.  REVIEW OF SYSTEMS:  Constitutional: positive for fatigue Eyes: negative Ears, nose, mouth, throat, and face: negative Respiratory: negative Cardiovascular: negative Gastrointestinal: negative Genitourinary:negative Integument/breast: negative Hematologic/lymphatic: negative Musculoskeletal:negative Neurological: negative Behavioral/Psych: negative Endocrine: negative Allergic/Immunologic: negative   PHYSICAL EXAMINATION: General appearance: alert, cooperative, fatigued and no  distress Head: Normocephalic, without obvious abnormality, atraumatic Neck: no adenopathy, no JVD, supple, symmetrical, trachea midline and thyroid not enlarged, symmetric, no tenderness/mass/nodules Lymph nodes: Cervical, supraclavicular, and axillary nodes normal. Resp: clear to auscultation bilaterally Back: symmetric, no curvature. ROM normal. No CVA tenderness. Cardio: regular rate and rhythm, S1, S2 normal, no murmur, click, rub or gallop GI: soft, non-tender; bowel sounds normal; no masses,  no organomegaly Extremities: edema 2+ edema left lower extremity Neurologic: Alert and oriented X 3, normal strength and tone. Normal symmetric reflexes. Normal coordination and gait  ECOG PERFORMANCE STATUS: 1 - Symptomatic but completely ambulatory  Blood pressure (!) 140/102, pulse (!) 109, temperature 98.2 F (36.8 C), temperature source Oral, resp. rate 20, height 5\' 9"  (1.753 m), weight 259 lb 8 oz (117.7 kg), SpO2 100 %.  LABORATORY DATA: Lab Results  Component Value Date   WBC 10.1 01/19/2020   HGB 13.1 01/19/2020   HCT 41.0 01/19/2020   MCV 101.2 (H) 01/19/2020   PLT 357 01/19/2020      Chemistry      Component Value Date/Time   NA 137 01/19/2020 0913   K 3.8 01/19/2020 0913   CL 98 01/19/2020 0913   CO2 28 01/19/2020 0913   BUN 9 01/19/2020 0913   CREATININE 0.84 01/19/2020 0913      Component Value Date/Time   CALCIUM 9.6 01/19/2020 0913   ALKPHOS 114 01/19/2020 0913   AST 25 01/19/2020 0913   ALT 24 01/19/2020 0913   BILITOT 0.4 01/19/2020 0913       RADIOGRAPHIC STUDIES: CT Chest W Contrast  Result Date: 01/17/2020 CLINICAL DATA:  Restaging lung cancer. Left leg swelling for 2 weeks. EXAM: CT CHEST, ABDOMEN, AND PELVIS WITH CONTRAST TECHNIQUE: Multidetector CT imaging of the chest, abdomen and pelvis was performed following the standard protocol during bolus administration of intravenous contrast. CONTRAST:  94mL OMNIPAQUE IOHEXOL 300 MG/ML  SOLN COMPARISON:   11/15/2019 FINDINGS: Despite efforts by the technologist and patient, motion artifact is present on today's exam and could not be eliminated. This reduces exam sensitivity and specificity. CT CHEST FINDINGS Cardiovascular: Coronary, aortic arch, and branch vessel atherosclerotic vascular disease. Mild cardiomegaly. Mediastinum/Nodes: Stable indistinctly marginated density in the AP window with possibilities including fluid along the superior pericardial recess or treatment related findings, without right obliquely measurable adenopathy. Small left supraclavicular node 0.7 cm in short axis on image 5/2, previously 0.5 cm. Lungs/Pleura: Trace left pleural effusion ground-glass density right upper lobe nodule 1.4 by 1.0 cm on image 36/6, previously the same by my measurements. A smaller ground-glass density nodule anteriorly in the right upper lobe is blurred by motion artifact but about 0.6 cm in diameter on image 36/6, similar to the prior exam as well. Mild bilateral airway thickening. Confluent bandlike and ground-glass densities in the left upper lobe are likely therapy related and with some localized exacerbated airway thickening/peribronchovascular density which is similar to prior. Small nodular component along the bandlike thickening in the left upper lobe measures 0.9 by 0.6 cm on image 43/6, previously the same. Mild scarring anteriorly in the left lower lobe. Musculoskeletal: Stable sclerotic 1.8  cm lesion in the T10 vertebral body compatible with previously treated malignancy. CT ABDOMEN PELVIS FINDINGS Hepatobiliary: Stable 4 mm hypodense lesion in the dome of the right hepatic lobe on image 36/2, no change from 03/31/2019, probably benign/incidental. Otherwise unremarkable. Pancreas: No well seen pancreatic tail mass. Pancreas appears unremarkable. Spleen: Unremarkable Adrenals/Urinary Tract: Unremarkable Stomach/Bowel: Redundant sigmoid colon. Vascular/Lymphatic: Aortoiliac atherosclerotic vascular  disease. No pathologic adenopathy identified. Reproductive: Unremarkable Other: No supplemental non-categorized findings. Musculoskeletal: Stable indistinct sclerosis posteriorly in the L5 vertebral body with a superior endplate lucent lesion or large Schmorl's node, unchanged from prior. Degenerative facet arthropathy at L4-5. Stable enchondroma in the right femoral neck. IMPRESSION: 1. Stable appearance of the chest, abdomen, and pelvis, with indistinctly marginated density in the AP window and a small nodular component along the bandlike thickening in the left upper lobe. Stable sclerotic lesion in the T10 vertebral body. Stable indistinct sclerosis posteriorly in the L5 vertebral body with a superior endplate Schmorl's node or large Schmorl's node. No new bony lesions are identified. 2. Stable ground-glass density nodules in the right upper lobe, postinflammatory versus low-grade adenocarcinoma. 3. Airway thickening is present, suggesting bronchitis or reactive airways disease. 4. Trace left pleural effusion. 5. Mild cardiomegaly. 6. Coronary, aortic arch, and branch vessel atherosclerotic vascular disease. 7. Stable 4 mm hypodense lesion in the dome of the right hepatic lobe, no change from 03/31/2019, probably benign/incidental. Electronically Signed   By: Van Clines M.D.   On: 01/17/2020 09:51   CT Abdomen Pelvis W Contrast  Result Date: 01/17/2020 CLINICAL DATA:  Restaging lung cancer. Left leg swelling for 2 weeks. EXAM: CT CHEST, ABDOMEN, AND PELVIS WITH CONTRAST TECHNIQUE: Multidetector CT imaging of the chest, abdomen and pelvis was performed following the standard protocol during bolus administration of intravenous contrast. CONTRAST:  14mL OMNIPAQUE IOHEXOL 300 MG/ML  SOLN COMPARISON:  11/15/2019 FINDINGS: Despite efforts by the technologist and patient, motion artifact is present on today's exam and could not be eliminated. This reduces exam sensitivity and specificity. CT CHEST FINDINGS  Cardiovascular: Coronary, aortic arch, and branch vessel atherosclerotic vascular disease. Mild cardiomegaly. Mediastinum/Nodes: Stable indistinctly marginated density in the AP window with possibilities including fluid along the superior pericardial recess or treatment related findings, without right obliquely measurable adenopathy. Small left supraclavicular node 0.7 cm in short axis on image 5/2, previously 0.5 cm. Lungs/Pleura: Trace left pleural effusion ground-glass density right upper lobe nodule 1.4 by 1.0 cm on image 36/6, previously the same by my measurements. A smaller ground-glass density nodule anteriorly in the right upper lobe is blurred by motion artifact but about 0.6 cm in diameter on image 36/6, similar to the prior exam as well. Mild bilateral airway thickening. Confluent bandlike and ground-glass densities in the left upper lobe are likely therapy related and with some localized exacerbated airway thickening/peribronchovascular density which is similar to prior. Small nodular component along the bandlike thickening in the left upper lobe measures 0.9 by 0.6 cm on image 43/6, previously the same. Mild scarring anteriorly in the left lower lobe. Musculoskeletal: Stable sclerotic 1.8 cm lesion in the T10 vertebral body compatible with previously treated malignancy. CT ABDOMEN PELVIS FINDINGS Hepatobiliary: Stable 4 mm hypodense lesion in the dome of the right hepatic lobe on image 36/2, no change from 03/31/2019, probably benign/incidental. Otherwise unremarkable. Pancreas: No well seen pancreatic tail mass. Pancreas appears unremarkable. Spleen: Unremarkable Adrenals/Urinary Tract: Unremarkable Stomach/Bowel: Redundant sigmoid colon. Vascular/Lymphatic: Aortoiliac atherosclerotic vascular disease. No pathologic adenopathy identified. Reproductive: Unremarkable Other: No supplemental non-categorized  findings. Musculoskeletal: Stable indistinct sclerosis posteriorly in the L5 vertebral body with a  superior endplate lucent lesion or large Schmorl's node, unchanged from prior. Degenerative facet arthropathy at L4-5. Stable enchondroma in the right femoral neck. IMPRESSION: 1. Stable appearance of the chest, abdomen, and pelvis, with indistinctly marginated density in the AP window and a small nodular component along the bandlike thickening in the left upper lobe. Stable sclerotic lesion in the T10 vertebral body. Stable indistinct sclerosis posteriorly in the L5 vertebral body with a superior endplate Schmorl's node or large Schmorl's node. No new bony lesions are identified. 2. Stable ground-glass density nodules in the right upper lobe, postinflammatory versus low-grade adenocarcinoma. 3. Airway thickening is present, suggesting bronchitis or reactive airways disease. 4. Trace left pleural effusion. 5. Mild cardiomegaly. 6. Coronary, aortic arch, and branch vessel atherosclerotic vascular disease. 7. Stable 4 mm hypodense lesion in the dome of the right hepatic lobe, no change from 03/31/2019, probably benign/incidental. Electronically Signed   By: Van Clines M.D.   On: 01/17/2020 09:51    ASSESSMENT AND PLAN: This is a very pleasant 59 years old African-American male with likely stage IV non-small cell lung cancer, adenocarcinoma presented with large left upper lobe lung mass in addition to mediastinal and left supraclavicular lymphadenopathy as well as suspicious bone metastasis and bilateral pulmonary nodules diagnosed in March 2020. The patient is currently undergoing systemic chemotherapy with carboplatin, Alimta and Keytruda status post 15 cycles.  Starting from cycle #5 he is receiving maintenance treatment with Alimta and Keytruda. The patient has been tolerating this treatment well with no concerning adverse effects. He had repeat CT scan of the chest, abdomen pelvis performed recently.  I personally and independently reviewed the scans and discussed the results with the patient and his  daughter. He has a scan showed no concerning findings for disease progression. I recommended for the patient to continue on his maintenance treatment with Alimta and Keytruda every 3 weeks and he will proceed with cycle #16 today. For the left lower extremity swelling, I will order Doppler of the left lower extremity to rule out deep venous thrombosis and if the patient has a positive deep venous thrombosis, I will start him on treatment with Xarelto. For the right shoulder pain, he is followed by orthopedic surgery and receiving steroid injection to this area. The patient will come back for follow-up visit in 3 weeks for evaluation before the next cycle of his treatment. He was advised to call immediately if he has any concerning symptoms in the interval. The patient voices understanding of current disease status and treatment options and is in agreement with the current care plan.  All questions were answered. The patient knows to call the clinic with any problems, questions or concerns. We can certainly see the patient much sooner if necessary.   Disclaimer: This note was dictated with voice recognition software. Similar sounding words can inadvertently be transcribed and may not be corrected upon review.

## 2020-01-26 ENCOUNTER — Other Ambulatory Visit: Payer: Self-pay | Admitting: Internal Medicine

## 2020-01-26 DIAGNOSIS — C3492 Malignant neoplasm of unspecified part of left bronchus or lung: Secondary | ICD-10-CM

## 2020-01-26 DIAGNOSIS — M542 Cervicalgia: Secondary | ICD-10-CM

## 2020-01-26 MED ORDER — OXYCODONE-ACETAMINOPHEN 5-325 MG PO TABS: 1.0000 | ORAL_TABLET | Freq: Three times a day (TID) | ORAL | 0 refills | Status: DC | PRN

## 2020-01-27 ENCOUNTER — Telehealth: Payer: Self-pay | Admitting: *Deleted

## 2020-01-27 NOTE — Telephone Encounter (Signed)
Called to make pt aware that his oxycodone prescription was filled by Dr. Julien Nordmann and can be picked up. Pt verbalized understanding

## 2020-02-02 ENCOUNTER — Other Ambulatory Visit (HOSPITAL_COMMUNITY): Payer: Self-pay | Admitting: Family Medicine

## 2020-02-02 DIAGNOSIS — M7989 Other specified soft tissue disorders: Secondary | ICD-10-CM

## 2020-02-02 DIAGNOSIS — R238 Other skin changes: Secondary | ICD-10-CM

## 2020-02-03 ENCOUNTER — Ambulatory Visit (HOSPITAL_COMMUNITY): Payer: Medicaid Other | Attending: Family Medicine

## 2020-02-09 ENCOUNTER — Other Ambulatory Visit: Payer: Self-pay

## 2020-02-09 ENCOUNTER — Inpatient Hospital Stay (HOSPITAL_BASED_OUTPATIENT_CLINIC_OR_DEPARTMENT_OTHER): Payer: Medicaid Other | Admitting: Internal Medicine

## 2020-02-09 ENCOUNTER — Inpatient Hospital Stay: Payer: Medicaid Other

## 2020-02-09 ENCOUNTER — Inpatient Hospital Stay: Payer: Medicaid Other | Attending: Internal Medicine

## 2020-02-09 ENCOUNTER — Encounter: Payer: Self-pay | Admitting: Internal Medicine

## 2020-02-09 ENCOUNTER — Other Ambulatory Visit: Payer: Self-pay | Admitting: Medical Oncology

## 2020-02-09 ENCOUNTER — Telehealth: Payer: Self-pay | Admitting: Medical Oncology

## 2020-02-09 VITALS — BP 159/98 | HR 115 | Temp 98.2°F | Resp 20 | Ht 69.0 in | Wt 264.1 lb

## 2020-02-09 VITALS — HR 108

## 2020-02-09 DIAGNOSIS — C3492 Malignant neoplasm of unspecified part of left bronchus or lung: Secondary | ICD-10-CM

## 2020-02-09 DIAGNOSIS — M7989 Other specified soft tissue disorders: Secondary | ICD-10-CM | POA: Diagnosis not present

## 2020-02-09 DIAGNOSIS — Z5112 Encounter for antineoplastic immunotherapy: Secondary | ICD-10-CM | POA: Diagnosis present

## 2020-02-09 DIAGNOSIS — M542 Cervicalgia: Secondary | ICD-10-CM | POA: Diagnosis not present

## 2020-02-09 DIAGNOSIS — Z5111 Encounter for antineoplastic chemotherapy: Secondary | ICD-10-CM | POA: Diagnosis present

## 2020-02-09 DIAGNOSIS — C3412 Malignant neoplasm of upper lobe, left bronchus or lung: Secondary | ICD-10-CM | POA: Insufficient documentation

## 2020-02-09 DIAGNOSIS — K5900 Constipation, unspecified: Secondary | ICD-10-CM

## 2020-02-09 DIAGNOSIS — Z79899 Other long term (current) drug therapy: Secondary | ICD-10-CM | POA: Insufficient documentation

## 2020-02-09 DIAGNOSIS — G8929 Other chronic pain: Secondary | ICD-10-CM

## 2020-02-09 DIAGNOSIS — M25511 Pain in right shoulder: Secondary | ICD-10-CM

## 2020-02-09 LAB — CBC WITH DIFFERENTIAL (CANCER CENTER ONLY)
Abs Immature Granulocytes: 0.02 10*3/uL (ref 0.00–0.07)
Basophils Absolute: 0 10*3/uL (ref 0.0–0.1)
Basophils Relative: 0 %
Eosinophils Absolute: 0.3 10*3/uL (ref 0.0–0.5)
Eosinophils Relative: 3 %
HCT: 37.2 % — ABNORMAL LOW (ref 39.0–52.0)
Hemoglobin: 12.2 g/dL — ABNORMAL LOW (ref 13.0–17.0)
Immature Granulocytes: 0 %
Lymphocytes Relative: 24 %
Lymphs Abs: 2.3 10*3/uL (ref 0.7–4.0)
MCH: 32.6 pg (ref 26.0–34.0)
MCHC: 32.8 g/dL (ref 30.0–36.0)
MCV: 99.5 fL (ref 80.0–100.0)
Monocytes Absolute: 1.1 10*3/uL — ABNORMAL HIGH (ref 0.1–1.0)
Monocytes Relative: 11 %
Neutro Abs: 5.9 10*3/uL (ref 1.7–7.7)
Neutrophils Relative %: 62 %
Platelet Count: 402 10*3/uL — ABNORMAL HIGH (ref 150–400)
RBC: 3.74 MIL/uL — ABNORMAL LOW (ref 4.22–5.81)
RDW: 12.9 % (ref 11.5–15.5)
WBC Count: 9.7 10*3/uL (ref 4.0–10.5)
nRBC: 0 % (ref 0.0–0.2)

## 2020-02-09 LAB — CMP (CANCER CENTER ONLY)
ALT: 18 U/L (ref 0–44)
AST: 23 U/L (ref 15–41)
Albumin: 3.7 g/dL (ref 3.5–5.0)
Alkaline Phosphatase: 141 U/L — ABNORMAL HIGH (ref 38–126)
Anion gap: 12 (ref 5–15)
BUN: 11 mg/dL (ref 6–20)
CO2: 25 mmol/L (ref 22–32)
Calcium: 9.2 mg/dL (ref 8.9–10.3)
Chloride: 101 mmol/L (ref 98–111)
Creatinine: 0.83 mg/dL (ref 0.61–1.24)
GFR, Est AFR Am: >60 mL/min (ref >60)
GFR, Estimated: >60 mL/min (ref >60)
Glucose, Bld: 101 mg/dL — ABNORMAL HIGH (ref 70–99)
Potassium: 3.7 mmol/L (ref 3.5–5.1)
Sodium: 138 mmol/L (ref 135–145)
Total Bilirubin: 0.4 mg/dL (ref 0.3–1.2)
Total Protein: 8.6 g/dL — ABNORMAL HIGH (ref 6.5–8.1)

## 2020-02-09 LAB — TSH: TSH: 0.932 u[IU]/mL (ref 0.320–4.118)

## 2020-02-09 MED ORDER — CYANOCOBALAMIN 1000 MCG/ML IJ SOLN
1000.0000 ug | Freq: Once | INTRAMUSCULAR | Status: AC
  Administered 2020-02-09: 1000 ug via INTRAMUSCULAR

## 2020-02-09 MED ORDER — SODIUM CHLORIDE 0.9 % IV SOLN
500.0000 mg/m2 | Freq: Once | INTRAVENOUS | Status: AC
  Administered 2020-02-09: 1100 mg via INTRAVENOUS
  Filled 2020-02-09: qty 40

## 2020-02-09 MED ORDER — CYANOCOBALAMIN 1000 MCG/ML IJ SOLN
INTRAMUSCULAR | Status: AC
  Filled 2020-02-09: qty 1

## 2020-02-09 MED ORDER — PROCHLORPERAZINE MALEATE 10 MG PO TABS
10.0000 mg | ORAL_TABLET | Freq: Once | ORAL | Status: AC
  Administered 2020-02-09: 10 mg via ORAL

## 2020-02-09 MED ORDER — OXYCODONE-ACETAMINOPHEN 5-325 MG PO TABS: 1.0000 | ORAL_TABLET | Freq: Three times a day (TID) | ORAL | 0 refills | Status: DC | PRN

## 2020-02-09 MED ORDER — SODIUM CHLORIDE 0.9 % IV SOLN
200.0000 mg | Freq: Once | INTRAVENOUS | Status: AC
  Administered 2020-02-09: 200 mg via INTRAVENOUS
  Filled 2020-02-09: qty 8

## 2020-02-09 MED ORDER — GABAPENTIN 300 MG PO CAPS: 300.0000 mg | ORAL_CAPSULE | Freq: Three times a day (TID) | ORAL | 3 refills | Status: AC

## 2020-02-09 MED ORDER — PROCHLORPERAZINE MALEATE 10 MG PO TABS
ORAL_TABLET | ORAL | Status: AC
  Filled 2020-02-09: qty 1

## 2020-02-09 MED ORDER — SODIUM CHLORIDE 0.9 % IV SOLN
Freq: Once | INTRAVENOUS | Status: AC
  Filled 2020-02-09: qty 250

## 2020-02-09 NOTE — Patient Instructions (Signed)
[facility] Discharge Instructions for Patients Receiving Chemotherapy  Today you received the following chemotherapy agents: keytruda, Alimta  To help prevent nausea and vomiting after your treatment, we encourage you to take your nausea medication as directed.   If you develop nausea and vomiting that is not controlled by your nausea medication, call the clinic.   BELOW ARE SYMPTOMS THAT SHOULD BE REPORTED IMMEDIATELY:  *FEVER GREATER THAN 100.5 F  *CHILLS WITH OR WITHOUT FEVER  NAUSEA AND VOMITING THAT IS NOT CONTROLLED WITH YOUR NAUSEA MEDICATION  *UNUSUAL SHORTNESS OF BREATH  *UNUSUAL BRUISING OR BLEEDING  TENDERNESS IN MOUTH AND THROAT WITH OR WITHOUT PRESENCE OF ULCERS  *URINARY PROBLEMS  *BOWEL PROBLEMS  UNUSUAL RASH Items with * indicate a potential emergency and should be followed up as soon as possible.  Feel free to call the clinic should you have any questions or concerns. The clinic phone number is [phone].  Please show the [facility] at check-in to the Emergency Department and triage nurse.

## 2020-02-09 NOTE — Telephone Encounter (Signed)
Appt today

## 2020-02-09 NOTE — Progress Notes (Signed)
Okay to treat with HR 108 per Dr. Julien Nordmann.

## 2020-02-09 NOTE — Progress Notes (Signed)
[place] [phone]   [phone]  OFFICE PROGRESS NOTE  [name], [address] [place] [address]  DIAGNOSIS: Stage IV (T2b, N3, M1 C) non-small cell lung cancer, adenocarcinoma presented with left upper lobe lung mass in addition to mediastinal and left supraclavicular lymphadenopathy as well as bilateral pulmonary nodules and suspicious bone lesion diagnosed in March 2020.  PRIOR THERAPY: None  CURRENT THERAPY: Systemic chemotherapy with carboplatin for AUC of 5, Alimta 500 mg/M2 and Keytruda 200 mg IV every 3 weeks.  First dose March 11, 2019.  Status post 16 cycles.  Starting from cycle #5 the patient is on maintenance treatment with Alimta and Keytruda every 3 weeks.  INTERVAL HISTORY: Willie Schmidt 59 y.o. male returns to the clinic today for follow-up visit.  The patient is feeling fine today with no concerning complaints except for the persistent swelling of the lower extremities.  He had a Doppler of the lower extremities that showed no evidence for deep venous thrombosis. He is currently on Lasix 20 mg p.o. daily as needed.  He denied having any current chest pain, shortness of breath, cough or hemoptysis.  He denied having any fever or chills.  He has no nausea, vomiting, diarrhea or constipation.  He has aching pain all over his body.  He is requesting refill of gabapentin and oxycodone.  The patient is here today for evaluation before starting cycle #17 of his treatment.  MEDICAL HISTORY: Past Medical History:  Diagnosis Date  . Bronchitis, chronic ([place])   . Hypertension   . metastatic lung ca dx'd 02/2019   lyphadenopathy, bil pulmonary noduels; suspected bone lesions    ALLERGIES:  has No Known Allergies.  MEDICATIONS:  Current Outpatient Medications  Medication Sig Dispense Refill  . albuterol (PROVENTIL HFA;VENTOLIN HFA) 108 (90 BASE) MCG/ACT inhaler Inhale 2 puffs into the lungs every 6 (six) hours as needed  for wheezing. 1 Inhaler 2  . amLODipine (NORVASC) 5 MG tablet TK 1 T PO Q NIGHT    . aspirin 81 MG tablet Take 81 mg by mouth daily.    . cyclobenzaprine (FLEXERIL) 10 MG tablet TAKE 1 TABLET BY MOUTH THREE TIMES DAILY AS NEEDED FOR MUSCLE SPASMS 30 tablet 0  . DULoxetine (CYMBALTA) 30 MG capsule 1 ONCE DAILY FOR 1 WEEK THEN INCREASE TO 1 TWICE DAILY 60 capsule 3  . folic acid (FOLVITE) 1 MG tablet TAKE 1 TABLET(1 MG) BY MOUTH DAILY 30 tablet 4  . furosemide (LASIX) 20 MG tablet Take 1 tablet (20 mg total) by mouth daily. 7 tablet 0  . gabapentin (NEURONTIN) 300 MG capsule Take 1 capsule (300 mg total) by mouth 3 (three) times daily. 90 capsule 3  . indomethacin (INDOCIN) 50 MG capsule Take 1 capsule (50 mg total) by mouth 3 (three) times daily with meals. 12 capsule 0  . lisinopril-hydrochlorothiazide (ZESTORETIC) 20-25 MG tablet Take 1 tablet by mouth daily.    . meloxicam (MOBIC) 15 MG tablet TK 1 T PO QD WF    . oxyCODONE-acetaminophen (PERCOCET/ROXICET) 5-325 MG tablet Take 1 tablet by mouth every 8 (eight) hours as needed for severe pain. 30 tablet 0  . potassium chloride SA (KLOR-CON) 20 MEQ tablet 1 Tablet once daily while taking Lasix 30 tablet 1  . prochlorperazine (COMPAZINE) 10 MG tablet TAKE 1 TABLET(10 MG) BY MOUTH EVERY 6 HOURS AS NEEDED FOR NAUSEA OR VOMITING 30 tablet 0   No current  facility-administered medications for this visit.    SURGICAL HISTORY: No past surgical history on file.  REVIEW OF SYSTEMS:  A comprehensive review of systems was negative except for: Constitutional: positive for fatigue Musculoskeletal: positive for arthralgias   PHYSICAL EXAMINATION: General appearance: alert, cooperative, fatigued and no distress Head: Normocephalic, without obvious abnormality, atraumatic Neck: no adenopathy, no JVD, supple, symmetrical, trachea midline and thyroid not enlarged, symmetric, no tenderness/mass/nodules Lymph nodes: Cervical, supraclavicular, and axillary nodes  normal. Resp: clear to auscultation bilaterally Back: symmetric, no curvature. ROM normal. No CVA tenderness. Cardio: regular rate and rhythm, S1, S2 normal, no murmur, click, rub or gallop GI: soft, non-tender; bowel sounds normal; no masses,  no organomegaly Extremities: edema 2+ edema left lower extremity  ECOG PERFORMANCE STATUS: 1 - Symptomatic but completely ambulatory  Blood pressure (!) 159/98, pulse (!) 115, temperature 98.2 F (36.8 C), temperature source Temporal, resp. rate 20, height 5\' 9"  (1.753 m), weight 264 lb 1.6 oz (119.8 kg), SpO2 100 %.  LABORATORY DATA: Lab Results  Component Value Date   WBC 9.7 02/09/2020   HGB 12.2 (L) 02/09/2020   HCT 37.2 (L) 02/09/2020   MCV 99.5 02/09/2020   PLT 402 (H) 02/09/2020      Chemistry      Component Value Date/Time   NA 137 01/19/2020 0913   K 3.8 01/19/2020 0913   CL 98 01/19/2020 0913   CO2 28 01/19/2020 0913   BUN 9 01/19/2020 0913   CREATININE 0.84 01/19/2020 0913      Component Value Date/Time   CALCIUM 9.6 01/19/2020 0913   ALKPHOS 114 01/19/2020 0913   AST 25 01/19/2020 0913   ALT 24 01/19/2020 0913   BILITOT 0.4 01/19/2020 0913       RADIOGRAPHIC STUDIES: CT Chest W Contrast  Result Date: 01/17/2020 CLINICAL DATA:  Restaging lung cancer. Left leg swelling for 2 weeks. EXAM: CT CHEST, ABDOMEN, AND PELVIS WITH CONTRAST TECHNIQUE: Multidetector CT imaging of the chest, abdomen and pelvis was performed following the standard protocol during bolus administration of intravenous contrast. CONTRAST:  26mL OMNIPAQUE IOHEXOL 300 MG/ML  SOLN COMPARISON:  11/15/2019 FINDINGS: Despite efforts by the technologist and patient, motion artifact is present on today's exam and could not be eliminated. This reduces exam sensitivity and specificity. CT CHEST FINDINGS Cardiovascular: Coronary, aortic arch, and branch vessel atherosclerotic vascular disease. Mild cardiomegaly. Mediastinum/Nodes: Stable indistinctly marginated  density in the AP window with possibilities including fluid along the superior pericardial recess or treatment related findings, without right obliquely measurable adenopathy. Small left supraclavicular node 0.7 cm in short axis on image 5/2, previously 0.5 cm. Lungs/Pleura: Trace left pleural effusion ground-glass density right upper lobe nodule 1.4 by 1.0 cm on image 36/6, previously the same by my measurements. A smaller ground-glass density nodule anteriorly in the right upper lobe is blurred by motion artifact but about 0.6 cm in diameter on image 36/6, similar to the prior exam as well. Mild bilateral airway thickening. Confluent bandlike and ground-glass densities in the left upper lobe are likely therapy related and with some localized exacerbated airway thickening/peribronchovascular density which is similar to prior. Small nodular component along the bandlike thickening in the left upper lobe measures 0.9 by 0.6 cm on image 43/6, previously the same. Mild scarring anteriorly in the left lower lobe. Musculoskeletal: Stable sclerotic 1.8 cm lesion in the T10 vertebral body compatible with previously treated malignancy. CT ABDOMEN PELVIS FINDINGS Hepatobiliary: Stable 4 mm hypodense lesion in the dome of the right  hepatic lobe on image 36/2, no change from 03/31/2019, probably benign/incidental. Otherwise unremarkable. Pancreas: No well seen pancreatic tail mass. Pancreas appears unremarkable. Spleen: Unremarkable Adrenals/Urinary Tract: Unremarkable Stomach/Bowel: Redundant sigmoid colon. Vascular/Lymphatic: Aortoiliac atherosclerotic vascular disease. No pathologic adenopathy identified. Reproductive: Unremarkable Other: No supplemental non-categorized findings. Musculoskeletal: Stable indistinct sclerosis posteriorly in the L5 vertebral body with a superior endplate lucent lesion or large Schmorl's node, unchanged from prior. Degenerative facet arthropathy at L4-5. Stable enchondroma in the right femoral  neck. IMPRESSION: 1. Stable appearance of the chest, abdomen, and pelvis, with indistinctly marginated density in the AP window and a small nodular component along the bandlike thickening in the left upper lobe. Stable sclerotic lesion in the T10 vertebral body. Stable indistinct sclerosis posteriorly in the L5 vertebral body with a superior endplate Schmorl's node or large Schmorl's node. No new bony lesions are identified. 2. Stable ground-glass density nodules in the right upper lobe, postinflammatory versus low-grade adenocarcinoma. 3. Airway thickening is present, suggesting bronchitis or reactive airways disease. 4. Trace left pleural effusion. 5. Mild cardiomegaly. 6. Coronary, aortic arch, and branch vessel atherosclerotic vascular disease. 7. Stable 4 mm hypodense lesion in the dome of the right hepatic lobe, no change from 03/31/2019, probably benign/incidental. Electronically Signed   By: Van Clines M.D.   On: 01/17/2020 09:51   CT Abdomen Pelvis W Contrast  Result Date: 01/17/2020 CLINICAL DATA:  Restaging lung cancer. Left leg swelling for 2 weeks. EXAM: CT CHEST, ABDOMEN, AND PELVIS WITH CONTRAST TECHNIQUE: Multidetector CT imaging of the chest, abdomen and pelvis was performed following the standard protocol during bolus administration of intravenous contrast. CONTRAST:  78mL OMNIPAQUE IOHEXOL 300 MG/ML  SOLN COMPARISON:  11/15/2019 FINDINGS: Despite efforts by the technologist and patient, motion artifact is present on today's exam and could not be eliminated. This reduces exam sensitivity and specificity. CT CHEST FINDINGS Cardiovascular: Coronary, aortic arch, and branch vessel atherosclerotic vascular disease. Mild cardiomegaly. Mediastinum/Nodes: Stable indistinctly marginated density in the AP window with possibilities including fluid along the superior pericardial recess or treatment related findings, without right obliquely measurable adenopathy. Small left supraclavicular node 0.7  cm in short axis on image 5/2, previously 0.5 cm. Lungs/Pleura: Trace left pleural effusion ground-glass density right upper lobe nodule 1.4 by 1.0 cm on image 36/6, previously the same by my measurements. A smaller ground-glass density nodule anteriorly in the right upper lobe is blurred by motion artifact but about 0.6 cm in diameter on image 36/6, similar to the prior exam as well. Mild bilateral airway thickening. Confluent bandlike and ground-glass densities in the left upper lobe are likely therapy related and with some localized exacerbated airway thickening/peribronchovascular density which is similar to prior. Small nodular component along the bandlike thickening in the left upper lobe measures 0.9 by 0.6 cm on image 43/6, previously the same. Mild scarring anteriorly in the left lower lobe. Musculoskeletal: Stable sclerotic 1.8 cm lesion in the T10 vertebral body compatible with previously treated malignancy. CT ABDOMEN PELVIS FINDINGS Hepatobiliary: Stable 4 mm hypodense lesion in the dome of the right hepatic lobe on image 36/2, no change from 03/31/2019, probably benign/incidental. Otherwise unremarkable. Pancreas: No well seen pancreatic tail mass. Pancreas appears unremarkable. Spleen: Unremarkable Adrenals/Urinary Tract: Unremarkable Stomach/Bowel: Redundant sigmoid colon. Vascular/Lymphatic: Aortoiliac atherosclerotic vascular disease. No pathologic adenopathy identified. Reproductive: Unremarkable Other: No supplemental non-categorized findings. Musculoskeletal: Stable indistinct sclerosis posteriorly in the L5 vertebral body with a superior endplate lucent lesion or large Schmorl's node, unchanged from prior. Degenerative facet arthropathy at  L4-5. Stable enchondroma in the right femoral neck. IMPRESSION: 1. Stable appearance of the chest, abdomen, and pelvis, with indistinctly marginated density in the AP window and a small nodular component along the bandlike thickening in the left upper lobe.  Stable sclerotic lesion in the T10 vertebral body. Stable indistinct sclerosis posteriorly in the L5 vertebral body with a superior endplate Schmorl's node or large Schmorl's node. No new bony lesions are identified. 2. Stable ground-glass density nodules in the right upper lobe, postinflammatory versus low-grade adenocarcinoma. 3. Airway thickening is present, suggesting bronchitis or reactive airways disease. 4. Trace left pleural effusion. 5. Mild cardiomegaly. 6. Coronary, aortic arch, and branch vessel atherosclerotic vascular disease. 7. Stable 4 mm hypodense lesion in the dome of the right hepatic lobe, no change from 03/31/2019, probably benign/incidental. Electronically Signed   By: Van Clines M.D.   On: 01/17/2020 09:51   VAS [place] LOWER EXTREMITY VENOUS (DVT)  Result Date: 01/19/2020  Lower Venous Study Indications: Pain, and Swelling.  Comparison Study: 10/20/19 Performing Technologist: Abram Sander RVS  Examination Guidelines: A complete evaluation includes B-mode imaging, spectral Doppler, color Doppler, and power Doppler as needed of all accessible portions of each vessel. Bilateral testing is considered an integral part of a complete examination. Limited examinations for reoccurring indications may be performed as noted.  +-----+---------------+---------+-----------+----------+--------------+ RIGHTCompressibilityPhasicitySpontaneityPropertiesThrombus Aging +-----+---------------+---------+-----------+----------+--------------+ CFV  Full           Yes      Yes                                 +-----+---------------+---------+-----------+----------+--------------+   +---------+---------------+---------+-----------+----------+--------------+ LEFT     CompressibilityPhasicitySpontaneityPropertiesThrombus Aging +---------+---------------+---------+-----------+----------+--------------+ CFV      Full           Yes      Yes                                  +---------+---------------+---------+-----------+----------+--------------+ SFJ      Full                                                        +---------+---------------+---------+-----------+----------+--------------+ FV Prox  Full                                                        +---------+---------------+---------+-----------+----------+--------------+ FV Mid   Full                                                        +---------+---------------+---------+-----------+----------+--------------+ FV DistalFull                                                        +---------+---------------+---------+-----------+----------+--------------+ PFV  Full                                                        +---------+---------------+---------+-----------+----------+--------------+ POP      Full           Yes      Yes                                 +---------+---------------+---------+-----------+----------+--------------+ PTV      Full                                                        +---------+---------------+---------+-----------+----------+--------------+ PERO                                                  Not visualized +---------+---------------+---------+-----------+----------+--------------+     Summary: Right: No evidence of common femoral vein obstruction. Left: There is no evidence of deep vein thrombosis in the lower extremity. No cystic structure found in the popliteal fossa.  *See table(s) above for measurements and observations. Electronically signed by Harold Barban MD on 01/19/2020 at 2:59:20 PM.    Final     ASSESSMENT AND PLAN: This is a very pleasant 59 years old African-American male with likely stage IV non-small cell lung cancer, adenocarcinoma presented with large left upper lobe lung mass in addition to mediastinal and left supraclavicular lymphadenopathy as well as suspicious bone metastasis and bilateral pulmonary  nodules diagnosed in March 2020. The patient is currently undergoing systemic chemotherapy with carboplatin, Alimta and Keytruda status post 16 cycles.  Starting from cycle #5 he is receiving maintenance treatment with Alimta and Keytruda. The patient has been tolerating this treatment well with no concerning adverse effects. I recommended for him to proceed with cycle #17 today as planned. For the left lower extremity swelling, the Doppler of the lower extremities showed no evidence for deep venous thrombosis.  The patient will continue his current treatment with Lasix 20 mg p.o. daily on as-needed basis.  He was also advised to to increase his potassium rich diet.   For pain management, I will give him refill for oxycodone and gabapentin. I will see him back for follow-up visit in 3 weeks for evaluation before the next cycle of his treatment. The patient was advised to call immediately if he has any concerning symptoms in the interval. The patient voices understanding of current disease status and treatment options and is in agreement with the current care plan. All questions were answered. The patient knows to call the clinic with any problems, questions or concerns. We can certainly see the patient much sooner if necessary.   Disclaimer: This note was dictated with voice recognition software. Similar sounding words can inadvertently be transcribed and may not be corrected upon review.

## 2020-02-09 NOTE — Progress Notes (Signed)
Willie Schmidt notified to pick up generic miralax for constipation.

## 2020-02-10 ENCOUNTER — Other Ambulatory Visit: Payer: Medicaid Other

## 2020-02-10 ENCOUNTER — Ambulatory Visit: Payer: Medicaid Other

## 2020-02-10 ENCOUNTER — Ambulatory Visit: Payer: Medicaid Other | Admitting: Internal Medicine

## 2020-02-24 IMAGING — CT CT ABD-PELV W/ CM
2 of 5 series · 13 of 36 positions shown, 16 images · IV contrast (omnipaque)
Comparison: 09/21/2019, 07/12/2019

CLINICAL DATA: Lung cancer staging

EXAM:
CT CHEST, ABDOMEN, AND PELVIS WITH CONTRAST
TECHNIQUE: Multidetector CT imaging of the chest, abdomen and pelvis was
performed following the standard protocol during bolus
administration of intravenous contrast.
CONTRAST:  100mL OMNIPAQUE IOHEXOL 300 MG/ML SOLN, additional oral
enteric contrast

[Series 2: cap with · axial · 0.91mm/px · z∈[+1013,+1553]mm · 10 of 132 slices shown, 13 images]
[im 12/132  mediastinal]
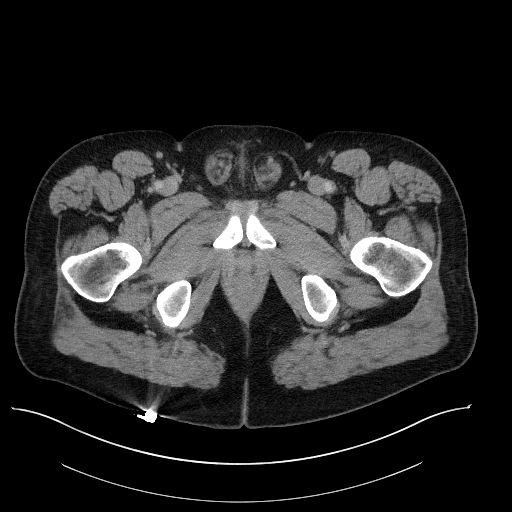
[im 12/132  lung]
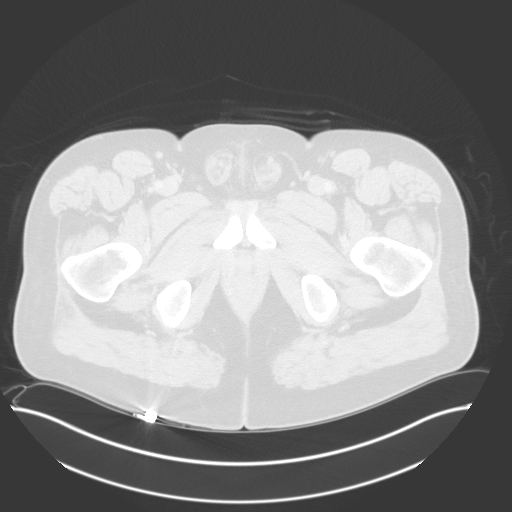
[im 24/132  lung]
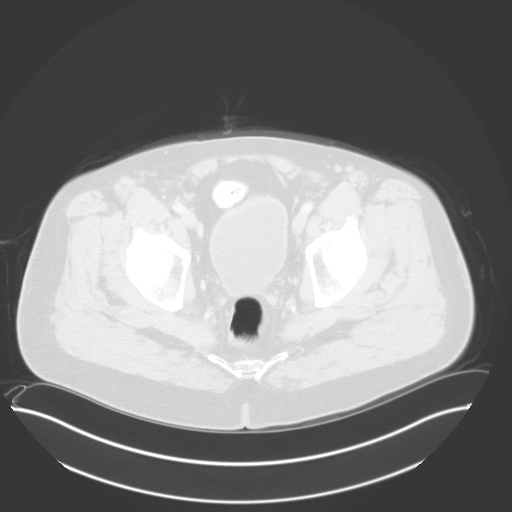
[im 36/132  lung]
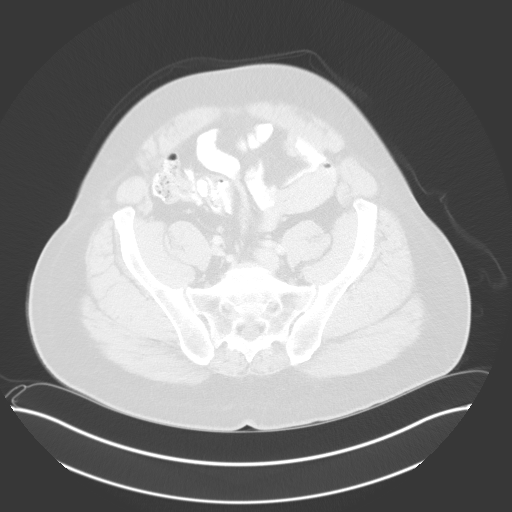
[im 48/132  lung]
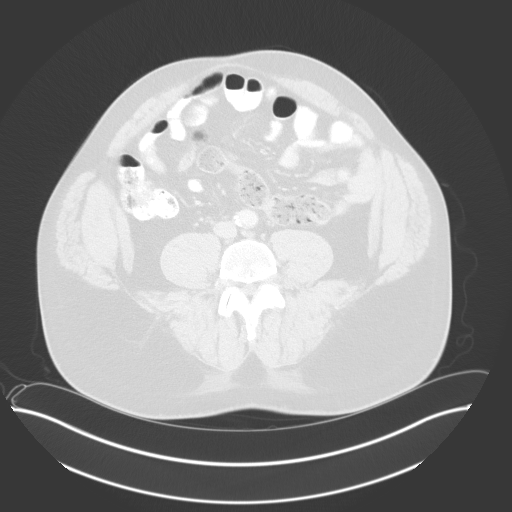
[im 60/132  mediastinal]
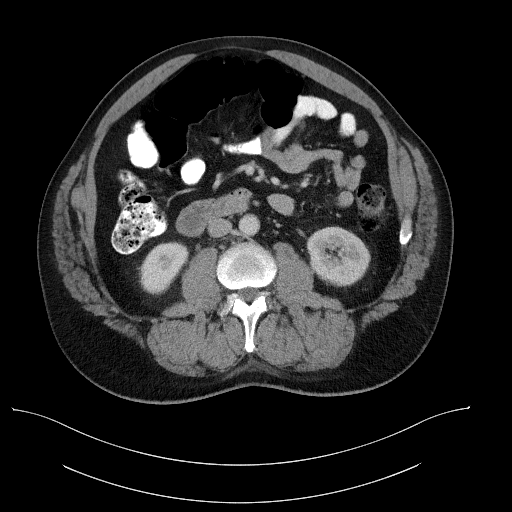
[im 60/132  lung]
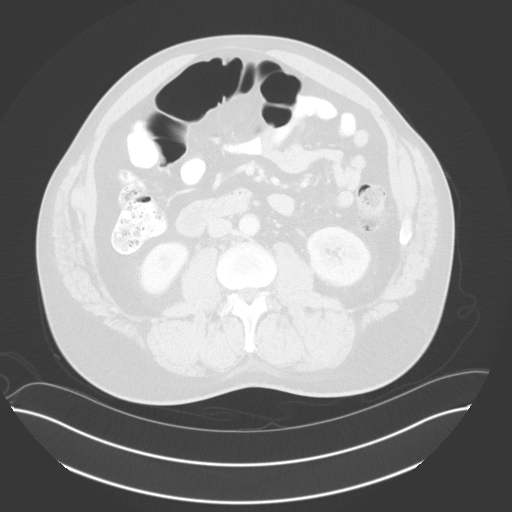
[im 72/132  lung]
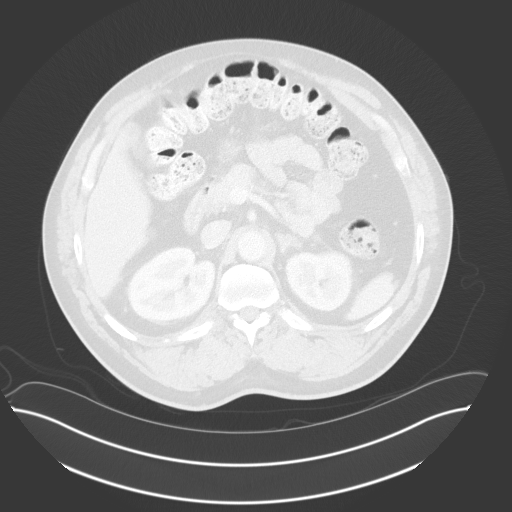
[im 84/132  lung]
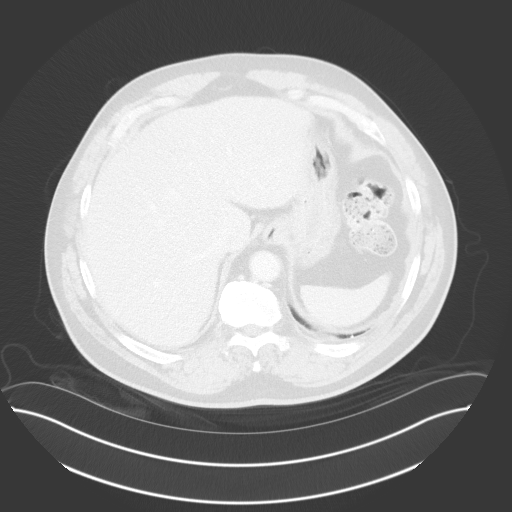
[im 96/132  lung]
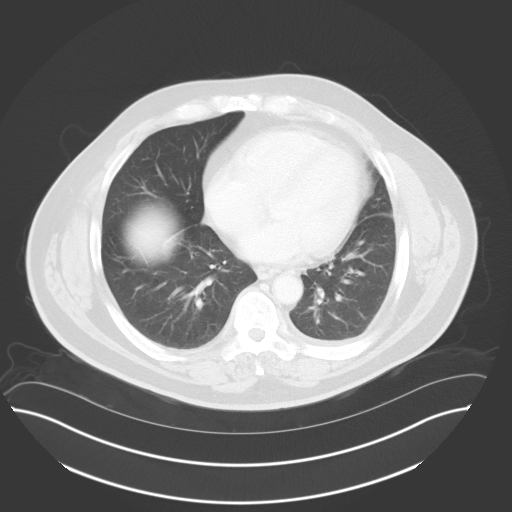
[im 108/132  mediastinal]
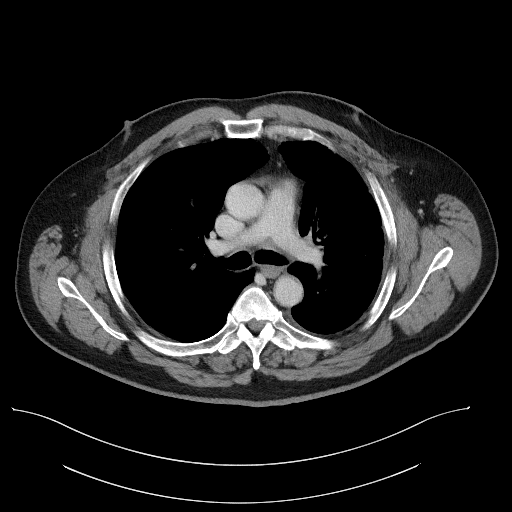
[im 108/132  lung]
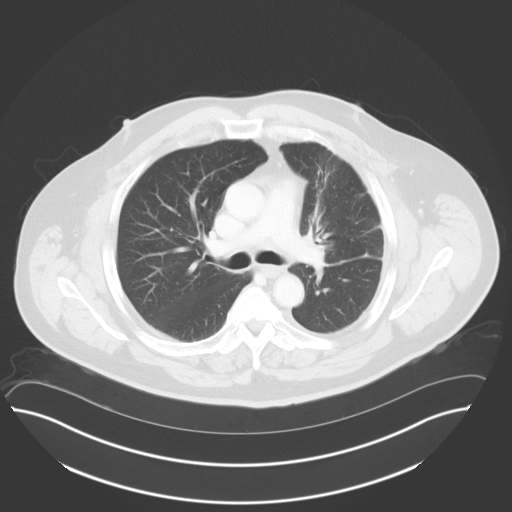
[im 120/132  lung]
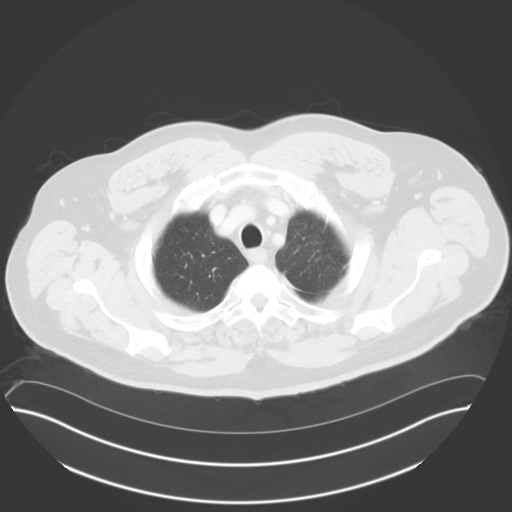

[Series 4: coronals · coronal · 0.97mm/px · 3 of 174 slices shown]
[im 35/174  lung]
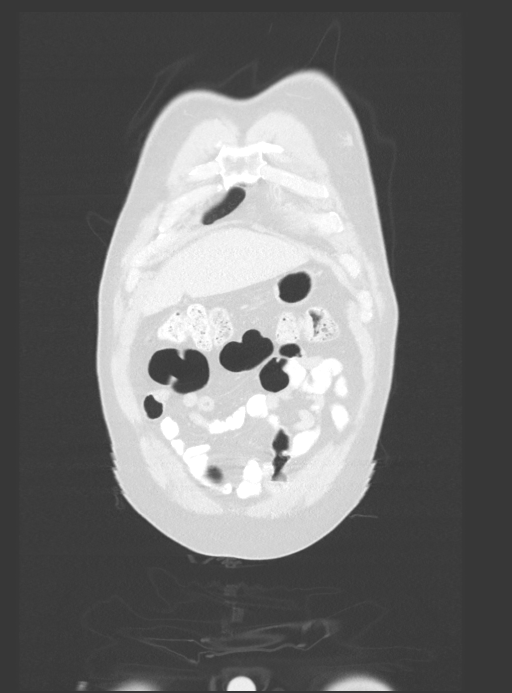
[im 70/174  lung]
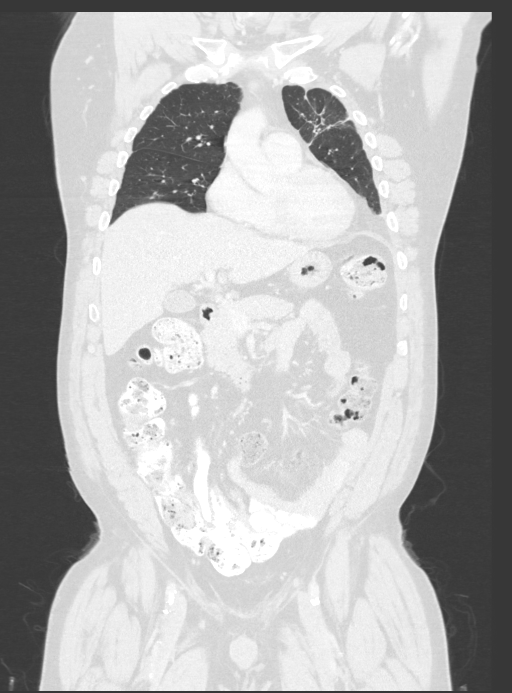
[im 104/174  lung]
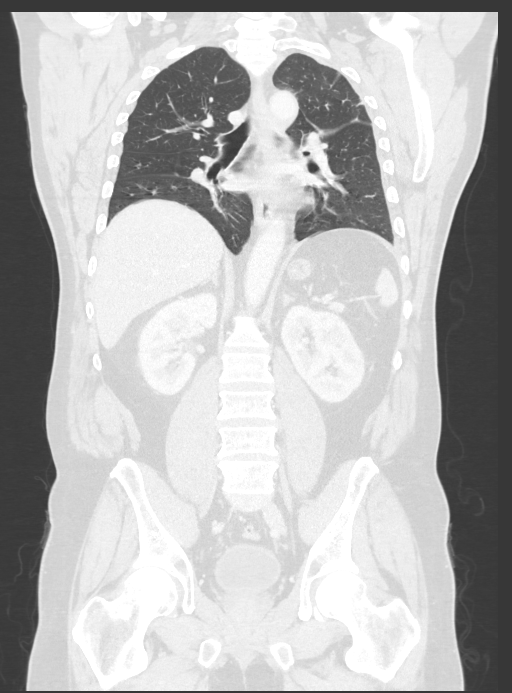

[13 of 36 positions shown; findings below may reference images not displayed]

FINDINGS: CT CHEST FINDINGS

Cardiovascular: No significant vascular findings. Normal heart size.
No pericardial effusion.

Mediastinum/Nodes: Unchanged post treatment appearance of left hilar
and AP window soft tissue thickening without discrete mediastinal
lymphadenopathy. Left supraclavicular lymph nodes have decreased in
size, now subcentimeter (series 2, image 9). Thyroid gland, trachea,
and esophagus demonstrate no significant findings.

Lungs/Pleura: Unchanged trace left pleural effusion. Unchanged post
treatment appearance of the left upper lobe with bronchial wall
thickening, irregular opacity, nodularity, and architectural
distortion. A ground-glass nodule of the posterior right upper lobe
may have slightly increased in size measuring 1.3 cm, previously
cm (series 6, image 52). This lesion was new on examination dated
05/11/2019 but does not appear to have truly changed in size on
multiple prior examinations in the interval when accounting for
slice selection.

Musculoskeletal: Unchanged sclerotic lesion of the T10 vertebral
body (series 5, image 99).

CT ABDOMEN PELVIS FINDINGS

Hepatobiliary: No solid liver abnormality is seen. Hepatic
steatosis. No gallstones, gallbladder wall thickening, or biliary
dilatation.

Pancreas: Previously seen pancreatic tail lesion remains resolved
and inapparent. No evidence of recurrent lesion. No pancreatic
ductal dilatation or surrounding inflammatory changes.

Spleen: Normal in size without significant abnormality.

Adrenals/Urinary Tract: Adrenal glands are unremarkable. Interval
improvement in previously seen mild bilateral perinephric fat
stranding. Bladder is unremarkable.

Stomach/Bowel: Stomach is within normal limits. Appendix appears
normal. No evidence of bowel wall thickening, distention, or
inflammatory changes.

Vascular/Lymphatic: Aortic atherosclerosis. No enlarged abdominal or
pelvic lymph nodes.

Reproductive: No mass or other abnormality.

Other: No abdominal wall hernia or abnormality. No abdominopelvic
ascites.

Musculoskeletal: Unchanged sclerotic lesion and superior endplate
deformity of L5 (series 5, image 93). Unchanged, densely calcified
or sclerotic lesion of the right femoral neck (series 4, image 101).
IMPRESSION: 1. Unchanged post treatment appearance of the left upper lobe with
bronchial wall thickening, irregular opacity, nodularity, and
architectural distortion. No new pulmonary mass or nodule.

2. Unchanged post treatment appearance of left hilar and AP window
soft tissue thickening without discrete mediastinal lymphadenopathy.
Left supraclavicular lymph nodes have decreased in size, now
subcentimeter (series 2, image 9).

3. A ground-glass nodule of the posterior right upper lobe may have
slightly increased in size measuring 1.3 cm, previously 0.9 cm
(series 6, image 52). This lesion was new on examination dated
05/11/2019 but does not appear to have truly changed in size on
multiple prior examinations in the interval when accounting for
slice selection. Generally continue to favor incidental infection or
inflammation although indolent synchronous malignancy is not
excluded. Attention on follow-up.

4. Previously seen pancreatic tail lesion remains resolved and
inapparent. No evidence of recurrent lesion or other metastatic
disease in the abdomen or pelvis.

5. Unchanged osseous metastatic disease of T10 and L5. No evidence
of new osseous metastatic disease. Unchanged, densely calcified or
sclerotic lesion of the right femoral neck (series 4, image 101),
which remains most consistent with a benign enchondroma or other
incidental benign lesion.

6. Interval improvement in previously seen mild bilateral
perinephric fat stranding, nonspecific and infectious or
inflammatory, differential considerations again including drug
toxicity.

7.  Aortic Atherosclerosis ([id number]).

## 2020-02-28 ENCOUNTER — Telehealth: Payer: Self-pay | Admitting: Internal Medicine

## 2020-02-28 ENCOUNTER — Telehealth: Payer: Self-pay | Admitting: Medical Oncology

## 2020-02-28 NOTE — Telephone Encounter (Signed)
I keep receiving conflicting messages from the patient and his daughters.  I have fully scheduled tomorrow and then not sure where to fit him in.  Thank you.

## 2020-02-28 NOTE — Telephone Encounter (Signed)
R/s appt per 3/8 sch message - pt daughter aware of appt

## 2020-02-28 NOTE — Telephone Encounter (Signed)
Stop treatment- dtr called -"pt wants to call it quits and not take anymore treatment."  ? Move up his appt from wed to tomorrow? ( dtr will be out of town on wed)  Pt continues with LE swelling.    Pain med refill-percocet

## 2020-02-28 NOTE — Telephone Encounter (Signed)
Dtr agrees to be on phone during visit on wed.

## 2020-02-28 NOTE — Progress Notes (Signed)
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  Biomarker Findings Microsatellite status - MS-Stable Tumor Mutational Burden - 5 Muts/Mb Genomic Findings For a complete list of the genes assayed, please refer to the Appendix. KRAS G12V CDKN2A/B p16INK4a R80* and M54YTK P54S CHEK1 splice site 568+1E>X NT70 R273L 7 Disease relevant genes with no reportable alterations: ALK, BRAF, EGFR, ERBB2, MET, RET, ROS1   PRIOR THERAPY: None  CURRENT THERAPY: Systemic chemotherapy with carboplatin for AUC of 5, Alimta 500 mg/M2 and Keytruda 200 mg IV every 3 weeks. First dose March20, 2020.Starting from cycle #5 the patient has been on maintenance Keytruda and Alimta. Status post17cycles.  INTERVAL HISTORY: Willie Schmidt 59 y.o. male returns to the clinic today for follow-up visit accompanied by his daughter.  The patient continues to complain of lower extremity swelling. Presently, his left is greater than the right. He uses compression stockings and elevates his legs in a recliner without relief. The patient had a Doppler ultrasound performed on his lower extremity which is negative for any evidence of DVT on several occassions.  He is currently on Lasix 20 mg without much improvement.The patient is now having associated skin darkening and dryness. The patient's daughter states that over the weekend they became frustrated. She called inquiring about hospice. Upon further discussion, his concerns are related to the swelling and the persistent right shoulder pain. MRIs have been performed in the past for  the shoulder pain which showed arthritis. He was referred to orthopedics who have periodically providing steroid injections. No metastatic disease is present in the right shoulder. He receives percocet for pain without significant relief of his symptoms. He also reports persistent burning in his feet. Per chart review, he has a prescription of gabapentin and Cymbalta.    Of note, palliative care team recently came to the patient's home for formal evaluation in which he declined their assistance at this time.    Regarding his treatment with Alimta and Keytruda, he is tolerating treatment well. The patient denies any recent fever, chills, night sweats, or weight loss.  He denies any chest pain, cough, or hemoptysis except for baseline dyspnea on exertion.  He denies any nausea, vomiting, diarrhea, or constipation.  He denies any headaches or visual changes.  He denies any rashes or skin changes.  Patient is here today for evaluation and a more detailed discussion about his current condition and treatment options before proceeding with cycle #18.   MEDICAL HISTORY: Past Medical History:  Diagnosis Date  . Bronchitis, chronic ([facility])   . Hypertension   . metastatic lung ca dx'd 02/2019   lyphadenopathy, bil pulmonary noduels; suspected bone lesions    ALLERGIES:  has No Known Allergies.  MEDICATIONS:  Current Outpatient Medications  Medication Sig Dispense Refill  . albuterol (PROVENTIL HFA;VENTOLIN HFA) 108 (90 BASE) MCG/ACT inhaler Inhale 2 puffs into the lungs every 6 (six) hours as needed for wheezing. 1 Inhaler 2  . amLODipine (NORVASC) 5 MG tablet TK 1 T PO Q NIGHT    . aspirin 81 MG tablet Take 81 mg by mouth daily.    . cyclobenzaprine (FLEXERIL)  10 MG tablet TAKE 1 TABLET BY MOUTH THREE TIMES DAILY AS NEEDED FOR MUSCLE SPASMS 30 tablet 0  . DULoxetine (CYMBALTA) 30 MG capsule 1 ONCE DAILY FOR 1 WEEK THEN INCREASE TO 1 TWICE DAILY 60 capsule 3  . folic acid (FOLVITE) 1 MG tablet TAKE 1  TABLET(1 MG) BY MOUTH DAILY 30 tablet 4  . furosemide (LASIX) 20 MG tablet Take 1 tablet (20 mg total) by mouth daily. 7 tablet 0  . gabapentin (NEURONTIN) 300 MG capsule Take 1 capsule (300 mg total) by mouth 3 (three) times daily. 90 capsule 3  . indomethacin (INDOCIN) 50 MG capsule Take 1 capsule (50 mg total) by mouth 3 (three) times daily with meals. 12 capsule 0  . lisinopril-hydrochlorothiazide (ZESTORETIC) 20-25 MG tablet Take 1 tablet by mouth daily.    . meloxicam (MOBIC) 15 MG tablet TK 1 T PO QD WF    . oxyCODONE-acetaminophen (PERCOCET/ROXICET) 5-325 MG tablet Take 1 tablet by mouth every 8 (eight) hours as needed for severe pain. 30 tablet 0  . potassium chloride SA (KLOR-CON) 20 MEQ tablet 1 Tablet once daily while taking Lasix 30 tablet 1  . prochlorperazine (COMPAZINE) 10 MG tablet TAKE 1 TABLET(10 MG) BY MOUTH EVERY 6 HOURS AS NEEDED FOR NAUSEA OR VOMITING 30 tablet 0   No current facility-administered medications for this visit.    SURGICAL HISTORY: No past surgical history on file.  REVIEW OF SYSTEMS:   Review of Systems  Constitutional: Negative for appetite change, chills, fatigue, fever and unexpected weight change.  HENT:   Negative for mouth sores, nosebleeds, sore throat and trouble swallowing.   Eyes: Negative for eye problems and icterus.  Respiratory: Positive for baseline dyspnea on exertion. Negative for cough, hemoptysis, and wheezing.   Cardiovascular: Positive for bilateral lower extremity swelling (L>R). Negative for chest pain. Gastrointestinal: Negative for abdominal pain, constipation, diarrhea, nausea and vomiting.  Genitourinary: Negative for bladder incontinence, difficulty urinating, dysuria, frequency and hematuria.   Musculoskeletal: Positive for right shoulder pain. Negative for back pain, gait problem, neck pain and neck stiffness.  Skin: Positive for darkening of skin lower extremity.  Neurological: Negative for dizziness, extremity weakness,  gait problem, headaches, light-headedness and seizures.  Hematological: Negative for adenopathy. Does not bruise/bleed easily.  Psychiatric/Behavioral: Negative for confusion, depression and sleep disturbance. The patient is not nervous/anxious.     PHYSICAL EXAMINATION:  There were no vitals taken for this visit.  ECOG PERFORMANCE STATUS: 1 - Symptomatic but completely ambulatory  Physical Exam  Constitutional: Oriented to person, place, and time and well-developed, well-nourished, and in no distress.  HENT:  Head: Normocephalic and atraumatic.  Mouth/Throat: Oropharynx is clear and moist. No oropharyngeal exudate.  Eyes: Conjunctivae are normal. Right eye exhibits no discharge. Left eye exhibits no discharge. No scleral icterus.  Neck: Normal range of motion. Neck supple.  Cardiovascular: Normal rate, regular rhythm, normal heart sounds and intact distal pulses.   Pulmonary/Chest: Effort normal and breath sounds normal. No respiratory distress. No wheezes. No rales.  Abdominal: Soft. Bowel sounds are normal. Exhibits no distension and no mass. There is no tenderness.  Musculoskeletal: Bilateral lower extremity swelling. Normal range of motion.  Lymphadenopathy:    No cervical adenopathy.  Neurological: Alert and oriented to person, place, and time. Exhibits normal muscle tone. Gait normal. Coordination normal.  Skin: Skin is warm and dry. No rash noted. Not diaphoretic. No erythema. No pallor.  Psychiatric: Mood, memory and judgment normal.  Vitals reviewed.  LABORATORY DATA:  Lab Results  Component Value Date   WBC 9.7 02/09/2020   HGB 12.2 (L) 02/09/2020   HCT 37.2 (L) 02/09/2020   MCV 99.5 02/09/2020   PLT 402 (H) 02/09/2020      Chemistry      Component Value Date/Time   NA 138 02/09/2020 1350   K 3.7 02/09/2020 1350   CL 101 02/09/2020 1350   CO2 25 02/09/2020 1350   BUN 11 02/09/2020 1350   CREATININE 0.83 02/09/2020 1350      Component Value Date/Time    CALCIUM 9.2 02/09/2020 1350   ALKPHOS 141 (H) 02/09/2020 1350   AST 23 02/09/2020 1350   ALT 18 02/09/2020 1350   BILITOT 0.4 02/09/2020 1350       RADIOGRAPHIC STUDIES:  No results found.   ASSESSMENT/PLAN:  This is a very pleasant 59 year old African-American male with stage IV non-small cell lung cancer, adenocarcinoma. He presented with a large left upper lobe lung mass with mediastinal andleft supraclavicular lymphadenopathy as well as bilateral pulmonary nodules. He was also found to have metastatic disease to the T10 vertebral body.His brain MRI was negative for any metastatic disease. He was diagnosed in March 2020.   The patient is currently undergoing treatment with carboplatin for an AUC of 5, Alimta 500 mg/m, and Keytruda 200 mg IV every 3 weeks. He is status post17cycles.Starting from cycle #5, the patient has been receivingmaintenance treatmentwith Keytruda 200 mg IV and Alimta 500 mg/m IV every 3 weeks.  The patient was seen with Dr. Julien Nordmann today.  Dr. Julien Nordmann had a lengthy discussion with the patient about his current condition And treatment options.  The patient is still interested in pursuing treatment with Alimta and Keytruda.   The patient will proceed with cycle #18 tomorrow scheduled.  I will arrange for restaging CT scan of the chest, abdomen, and pelvis prior to cycle #19.  Regarding the patient's pain, discussed that his pain is likely not related to his malignancy.  I have sent a refill of Percocet to his pharmacy to take every 4-6 hours as needed for pain.  I have placed a referral to Dr. Maryjean Ka, in pain management.  He has an appointment with Ortho later this month for his right shoulder pain.  I have placed a order for the patient to obtain a cane for assistance with his mobility.  Regarding the patient's lower extremity swelling, he has had multiple negative studies assessing for DVT.  No improvement of his symptoms with Lasix.  Dr. Julien Nordmann  recommends that we refer the patient to vascular surgery for evaluation and recommendations regarding his condition.  The patient was advised to call immediately if he has any concerning symptoms in the interval. The patient voices understanding of current disease status and treatment options and is in agreement with the current care plan. All questions were answered. The patient knows to call the clinic with any problems, questions or concerns. We can certainly see the patient much sooner if necessary   No orders of the defined types were placed in this encounter.    Roi Jafari L Jordain Radin, PA-C 02/28/20  ADDENDUM: Hematology/Oncology Attending: I had a face-to-face encounter with the patient today.  I recommended his care plan.  This is a very pleasant 59 years old African-American male diagnosed with a stage IV non-small cell lung cancer, adenocarcinoma status post induction systemic chemotherapy with carboplatin, Alimta and Keytruda for 4 cycles with partial response.  The patient is currently undergoing maintenance treatment with Alimta and  Keytruda every 3 weeks is status post 17 cycles and has been tolerating his treatment well. The patient had several issues including generalized aching pain more in the right shoulder and lower extremities.  He has several studies for evaluation of the right shoulder area and no clear evidence of metastatic disease to this area.  He received a steroid injection by his orthopedic surgeon which gave him relief for a while before recurrence back.  He also has a swelling of the lower extremities and he was tested in the past for the venous thrombosis and was negative. He is currently on pain medication with Percocet but the patient's daughter is not satisfied with his current pain management and requesting more and specific pain medication.  We will refer the patient to the pain clinic for further evaluation and management of his pain at this point.  We will  give him refill of Percocet to be used more frequently for now until he establish care with the pain clinic. For the lower extremity swelling, we will refer the patient to vascular surgery for evaluation. I had a lengthy discussion with the patient and his daughters today about his current condition and the goals of care.  He understands that his treatment is of palliative nature.  There was also some differences the patient and his daughter regarding the goals of care and the importance of treatment.  The patient would like to continue treatment as planned and to fight his cancer as long as he can but the daughters are looking for more palliation and less or no treatment if possible. The patient will continue his current treatment and he is expected to start cycle #18 tomorrow. If the patient decided to discontinue treatment, we will arrange for him referral to palliative and hospice care. He will come back for follow-up visit in 3 weeks before the next visit of his treatment. The patient was advised to call immediately if he has any concerning symptoms in the interval.  Disclaimer: This note was dictated with voice recognition software. Similar sounding words can inadvertently be transcribed and may be missed upon review. Eilleen Kempf, MD 02/29/20

## 2020-02-28 NOTE — Telephone Encounter (Signed)
LVM that refill request sent to [facility].

## 2020-02-29 ENCOUNTER — Encounter: Payer: Self-pay | Admitting: Physician Assistant

## 2020-02-29 ENCOUNTER — Other Ambulatory Visit: Payer: Self-pay | Admitting: *Deleted

## 2020-02-29 ENCOUNTER — Other Ambulatory Visit: Payer: Self-pay

## 2020-02-29 ENCOUNTER — Inpatient Hospital Stay: Payer: Medicaid Other | Attending: Internal Medicine

## 2020-02-29 ENCOUNTER — Inpatient Hospital Stay (HOSPITAL_BASED_OUTPATIENT_CLINIC_OR_DEPARTMENT_OTHER): Payer: Medicaid Other | Admitting: Physician Assistant

## 2020-02-29 VITALS — BP 140/80 | HR 116 | Temp 98.3°F | Resp 20 | Ht 69.0 in | Wt 257.0 lb

## 2020-02-29 DIAGNOSIS — Z79899 Other long term (current) drug therapy: Secondary | ICD-10-CM | POA: Diagnosis not present

## 2020-02-29 DIAGNOSIS — M542 Cervicalgia: Secondary | ICD-10-CM

## 2020-02-29 DIAGNOSIS — Z5112 Encounter for antineoplastic immunotherapy: Secondary | ICD-10-CM | POA: Diagnosis not present

## 2020-02-29 DIAGNOSIS — Z7189 Other specified counseling: Secondary | ICD-10-CM

## 2020-02-29 DIAGNOSIS — Z5111 Encounter for antineoplastic chemotherapy: Secondary | ICD-10-CM

## 2020-02-29 DIAGNOSIS — C3492 Malignant neoplasm of unspecified part of left bronchus or lung: Secondary | ICD-10-CM

## 2020-02-29 DIAGNOSIS — M7989 Other specified soft tissue disorders: Secondary | ICD-10-CM | POA: Insufficient documentation

## 2020-02-29 DIAGNOSIS — C3412 Malignant neoplasm of upper lobe, left bronchus or lung: Secondary | ICD-10-CM | POA: Diagnosis present

## 2020-02-29 DIAGNOSIS — C7951 Secondary malignant neoplasm of bone: Secondary | ICD-10-CM | POA: Insufficient documentation

## 2020-02-29 LAB — CMP (CANCER CENTER ONLY)
ALT: 19 U/L (ref 0–44)
AST: 59 U/L — ABNORMAL HIGH (ref 15–41)
Albumin: 3.8 g/dL (ref 3.5–5.0)
Alkaline Phosphatase: 146 U/L — ABNORMAL HIGH (ref 38–126)
Anion gap: 9 (ref 5–15)
BUN: 7 mg/dL (ref 6–20)
CO2: 27 mmol/L (ref 22–32)
Calcium: 9.4 mg/dL (ref 8.9–10.3)
Chloride: 101 mmol/L (ref 98–111)
Creatinine: 0.81 mg/dL (ref 0.61–1.24)
GFR, Est AFR Am: >60 mL/min (ref >60)
GFR, Estimated: >60 mL/min (ref >60)
Glucose, Bld: 122 mg/dL — ABNORMAL HIGH (ref 70–99)
Potassium: 3.6 mmol/L (ref 3.5–5.1)
Sodium: 137 mmol/L (ref 135–145)
Total Bilirubin: 0.5 mg/dL (ref 0.3–1.2)
Total Protein: 9 g/dL — ABNORMAL HIGH (ref 6.5–8.1)

## 2020-02-29 LAB — CBC WITH DIFFERENTIAL (CANCER CENTER ONLY)
Abs Immature Granulocytes: 0.02 10*3/uL (ref 0.00–0.07)
Basophils Absolute: 0 10*3/uL (ref 0.0–0.1)
Basophils Relative: 0 %
Eosinophils Absolute: 0.4 10*3/uL (ref 0.0–0.5)
Eosinophils Relative: 5 %
HCT: 37.6 % — ABNORMAL LOW (ref 39.0–52.0)
Hemoglobin: 11.8 g/dL — ABNORMAL LOW (ref 13.0–17.0)
Immature Granulocytes: 0 %
Lymphocytes Relative: 28 %
Lymphs Abs: 2.5 10*3/uL (ref 0.7–4.0)
MCH: 31.6 pg (ref 26.0–34.0)
MCHC: 31.4 g/dL (ref 30.0–36.0)
MCV: 100.5 fL — ABNORMAL HIGH (ref 80.0–100.0)
Monocytes Absolute: 1.1 10*3/uL — ABNORMAL HIGH (ref 0.1–1.0)
Monocytes Relative: 12 %
Neutro Abs: 4.8 10*3/uL (ref 1.7–7.7)
Neutrophils Relative %: 55 %
Platelet Count: 380 10*3/uL (ref 150–400)
RBC: 3.74 MIL/uL — ABNORMAL LOW (ref 4.22–5.81)
RDW: 14 % (ref 11.5–15.5)
WBC Count: 8.8 10*3/uL (ref 4.0–10.5)
nRBC: 0 % (ref 0.0–0.2)

## 2020-02-29 LAB — TSH: TSH: 1.158 u[IU]/mL (ref 0.320–4.118)

## 2020-02-29 MED ORDER — OXYCODONE-ACETAMINOPHEN 5-325 MG PO TABS: 1.0000 | ORAL_TABLET | Freq: Four times a day (QID) | ORAL | 0 refills | Status: DC | PRN

## 2020-03-01 ENCOUNTER — Other Ambulatory Visit: Payer: Self-pay

## 2020-03-01 ENCOUNTER — Ambulatory Visit: Payer: Medicaid Other | Admitting: Physician Assistant

## 2020-03-01 ENCOUNTER — Inpatient Hospital Stay: Payer: Medicaid Other

## 2020-03-01 ENCOUNTER — Telehealth: Payer: Self-pay | Admitting: General Practice

## 2020-03-01 ENCOUNTER — Other Ambulatory Visit: Payer: Medicaid Other

## 2020-03-01 VITALS — BP 140/89 | HR 100 | Temp 97.8°F | Resp 19

## 2020-03-01 DIAGNOSIS — C3492 Malignant neoplasm of unspecified part of left bronchus or lung: Secondary | ICD-10-CM

## 2020-03-01 DIAGNOSIS — Z5112 Encounter for antineoplastic immunotherapy: Secondary | ICD-10-CM | POA: Diagnosis not present

## 2020-03-01 MED ORDER — SODIUM CHLORIDE 0.9 % IV SOLN
Freq: Once | INTRAVENOUS | Status: AC
  Filled 2020-03-01: qty 250

## 2020-03-01 MED ORDER — PROCHLORPERAZINE MALEATE 10 MG PO TABS
10.0000 mg | ORAL_TABLET | Freq: Once | ORAL | Status: AC
  Administered 2020-03-01: 10 mg via ORAL

## 2020-03-01 MED ORDER — PROCHLORPERAZINE MALEATE 10 MG PO TABS
ORAL_TABLET | ORAL | Status: AC
  Filled 2020-03-01: qty 1

## 2020-03-01 MED ORDER — SODIUM CHLORIDE 0.9 % IV SOLN
500.0000 mg/m2 | Freq: Once | INTRAVENOUS | Status: AC
  Administered 2020-03-01: 1100 mg via INTRAVENOUS
  Filled 2020-03-01: qty 40

## 2020-03-01 MED ORDER — SODIUM CHLORIDE 0.9 % IV SOLN
200.0000 mg | Freq: Once | INTRAVENOUS | Status: AC
  Administered 2020-03-01: 200 mg via INTRAVENOUS
  Filled 2020-03-01: qty 8

## 2020-03-01 NOTE — Telephone Encounter (Signed)
[facility] CSW Progress Notes  Request from Egbert Garibaldi - patient has asked to speak w social worker.  Called patient "I would like some financial assistance."  Specifically he is asking for gas money or help w transportation - he does not drive and is paying someone to drive him to/from [facility] appts.  Explained option of [employer] transportation - patient would like to be contacted by them to schedule rides for upcoming appointments.  Messaged transportation services.  Edwyna Shell, LCSW Clinical Social Worker Phone:  [phone] Cell:  [phone]

## 2020-03-01 NOTE — Patient Instructions (Signed)
COVID-19 Vaccine Information can be found at: ShippingScam.co.uk For questions related to vaccine distribution or appointments, please email [email or web address] or call [phone].   [facility] Discharge Instructions for Patients Receiving Immunotherapy and Chemotherapy  Today you received the following immunotherapy and chemotherapy agents: Pembrolizumab (Keytruda) and Pemetrexed (Alimta)  To help prevent nausea and vomiting after your treatment, we encourage you to take your nausea medication as directed by your provider.    If you develop nausea and vomiting that is not controlled by your nausea medication, call the clinic.   BELOW ARE SYMPTOMS THAT SHOULD BE REPORTED IMMEDIATELY:  *FEVER GREATER THAN 100.5 F  *CHILLS WITH OR WITHOUT FEVER  NAUSEA AND VOMITING THAT IS NOT CONTROLLED WITH YOUR NAUSEA MEDICATION  *UNUSUAL SHORTNESS OF BREATH  *UNUSUAL BRUISING OR BLEEDING  TENDERNESS IN MOUTH AND THROAT WITH OR WITHOUT PRESENCE OF ULCERS  *URINARY PROBLEMS  *BOWEL PROBLEMS  UNUSUAL RASH Items with * indicate a potential emergency and should be followed up as soon as possible.  Feel free to call the clinic should you have any questions or concerns. The clinic phone number is [phone].  Please show the [facility] at check-in to the Emergency Department and triage nurse.  Coronavirus (COVID-19) Are you at risk?  Are you at risk for the Coronavirus (COVID-19)?  To be considered HIGH RISK for Coronavirus (COVID-19), you have to meet the following criteria:  . Traveled to [place], [place], [place], [place] or [place]; or in the [place] to [place], [place], [place], or [place]; and have fever, cough, and shortness of breath within the last 2 weeks of travel OR . Been in close contact with a person diagnosed with COVID-19 within the last 2 weeks and have fever, cough, and  shortness of breath . IF YOU DO NOT MEET THESE CRITERIA, YOU ARE CONSIDERED LOW RISK FOR COVID-19.  What to do if you are HIGH RISK for COVID-19?  Marland Kitchen If you are having a medical emergency, call 911. . Seek medical care right away. Before you go to a doctor's office, urgent care or emergency department, call ahead and tell them about your recent travel, contact with someone diagnosed with COVID-19, and your symptoms. You should receive instructions from your physician's office regarding next steps of care.  . When you arrive at healthcare provider, tell the healthcare staff immediately you have returned from visiting [place], [place], [place], [place] or [place]; or traveled in the [place] to [place], [place], [place], or [place]; in the last two weeks or you have been in close contact with a person diagnosed with COVID-19 in the last 2 weeks.   . Tell the health care staff about your symptoms: fever, cough and shortness of breath. . After you have been seen by a medical provider, you will be either: o Tested for (COVID-19) and discharged home on quarantine except to seek medical care if symptoms worsen, and asked to  - Stay home and avoid contact with others until you get your results (4-5 days)  - Avoid travel on public transportation if possible (such as bus, train, or airplane) or o Sent to the Emergency Department by EMS for evaluation, COVID-19 testing, and possible admission depending on your condition and test results.  What to do if you are LOW RISK for COVID-19?  Reduce your risk of any infection by using the same precautions used for avoiding the common cold or flu:  Marland Kitchen Wash your hands  often with soap and warm water for at least 20 seconds.  If soap and water are not readily available, use an alcohol-based hand sanitizer with at least 60% alcohol.  . If coughing or sneezing, cover your mouth and nose by coughing or sneezing into the elbow areas of your shirt or coat, into a  tissue or into your sleeve (not your hands). . Avoid shaking hands with others and consider head nods or verbal greetings only. . Avoid touching your eyes, nose, or mouth with unwashed hands.  . Avoid close contact with people who are sick. . Avoid places or events with large numbers of people in one location, like concerts or sporting events. . Carefully consider travel plans you have or are making. . If you are planning any travel outside or inside the [place], visit the CDC's Travelers' Health webpage for the latest health notices. . If you have some symptoms but not all symptoms, continue to monitor at home and seek medical attention if your symptoms worsen. . If you are having a medical emergency, call 911.   [place] / e-Visit: [email or web address]         [facility]: [facility]: [phone]                   [facility]: [phone]

## 2020-03-07 ENCOUNTER — Telehealth: Payer: Self-pay | Admitting: *Deleted

## 2020-03-07 NOTE — Telephone Encounter (Signed)
Daughter called to check referral to Pain Clinic- have not heard anything. She was also questioning why the percocet was only filled with #30 instead of # 60.   RN confirmed that patient can take 1 tablet every 4-6 hours as needed for pain. Patient to call a few days before he runs out to get a refill.  RN called Dr Donell Sievert office- left VM with referral information. Was placed in [place] on 02/28/20.

## 2020-03-08 ENCOUNTER — Telehealth: Payer: Self-pay | Admitting: Medical Oncology

## 2020-03-08 NOTE — Telephone Encounter (Signed)
Faxed referral order, last progress note and insurance to Dr. Donell Sievert office.

## 2020-03-14 ENCOUNTER — Telehealth: Payer: Self-pay | Admitting: Medical Oncology

## 2020-03-14 ENCOUNTER — Other Ambulatory Visit: Payer: Self-pay | Admitting: Internal Medicine

## 2020-03-14 DIAGNOSIS — C3492 Malignant neoplasm of unspecified part of left bronchus or lung: Secondary | ICD-10-CM

## 2020-03-14 DIAGNOSIS — M542 Cervicalgia: Secondary | ICD-10-CM

## 2020-03-14 MED ORDER — OXYCODONE-ACETAMINOPHEN 5-325 MG PO TABS: 1.0000 | ORAL_TABLET | Freq: Four times a day (QID) | ORAL | 0 refills | Status: DC | PRN

## 2020-03-14 NOTE — Telephone Encounter (Signed)
Request refill for OXycodone.-last fill 3/9 q 6 prn # 30

## 2020-03-16 NOTE — Progress Notes (Signed)
Pharmacist Chemotherapy Monitoring - Follow Up Assessment    I verify that I have reviewed each item in the below checklist:  . Regimen for the patient is scheduled for the appropriate day and plan matches scheduled date. Marland Kitchen Appropriate non-routine labs are ordered dependent on drug ordered. . If applicable, additional medications reviewed and ordered per protocol based on lifetime cumulative doses and/or treatment regimen.   Plan for follow-up and/or issues identified: No . I-vent associated with next due treatment: No  Britt Boozer 03/16/2020 8:13 AM

## 2020-03-17 ENCOUNTER — Ambulatory Visit (HOSPITAL_COMMUNITY): Payer: Medicaid Other

## 2020-03-17 ENCOUNTER — Other Ambulatory Visit: Payer: Self-pay

## 2020-03-17 ENCOUNTER — Ambulatory Visit (HOSPITAL_COMMUNITY)
Admission: RE | Admit: 2020-03-17 | Discharge: 2020-03-17 | Disposition: A | Discharge status: Home / Self Care | Payer: Medicaid Other | Source: Ambulatory Visit | Attending: Physician Assistant | Admitting: Physician Assistant

## 2020-03-17 DIAGNOSIS — C3492 Malignant neoplasm of unspecified part of left bronchus or lung: Secondary | ICD-10-CM

## 2020-03-17 MED ORDER — IOHEXOL 300 MG/ML  SOLN
75.0000 mL | Freq: Once | INTRAMUSCULAR | Status: AC | PRN
  Administered 2020-03-17: 75 mL via INTRAVENOUS

## 2020-03-20 NOTE — Progress Notes (Signed)
Cassie Heilingoetter, PA notified that pt[facility] been [facility] and he has an appointment on 05/02/20

## 2020-03-21 ENCOUNTER — Telehealth: Payer: Self-pay | Admitting: Medical Oncology

## 2020-03-21 NOTE — Telephone Encounter (Signed)
[facility] appt is scheduled for may 5th at 1315.

## 2020-03-22 ENCOUNTER — Encounter: Payer: Self-pay | Admitting: Internal Medicine

## 2020-03-22 ENCOUNTER — Inpatient Hospital Stay (HOSPITAL_BASED_OUTPATIENT_CLINIC_OR_DEPARTMENT_OTHER): Payer: Medicaid Other | Admitting: Internal Medicine

## 2020-03-22 ENCOUNTER — Inpatient Hospital Stay: Payer: Medicaid Other

## 2020-03-22 ENCOUNTER — Other Ambulatory Visit: Payer: Self-pay

## 2020-03-22 VITALS — BP 145/97 | HR 107 | Temp 98.0°F | Resp 20 | Ht 69.0 in | Wt 259.3 lb

## 2020-03-22 DIAGNOSIS — C3492 Malignant neoplasm of unspecified part of left bronchus or lung: Secondary | ICD-10-CM

## 2020-03-22 DIAGNOSIS — Z7189 Other specified counseling: Secondary | ICD-10-CM

## 2020-03-22 DIAGNOSIS — Z5111 Encounter for antineoplastic chemotherapy: Secondary | ICD-10-CM

## 2020-03-22 DIAGNOSIS — G8929 Other chronic pain: Secondary | ICD-10-CM

## 2020-03-22 DIAGNOSIS — M25511 Pain in right shoulder: Secondary | ICD-10-CM | POA: Diagnosis not present

## 2020-03-22 DIAGNOSIS — M542 Cervicalgia: Secondary | ICD-10-CM | POA: Diagnosis not present

## 2020-03-22 DIAGNOSIS — Z5112 Encounter for antineoplastic immunotherapy: Secondary | ICD-10-CM

## 2020-03-22 LAB — CBC WITH DIFFERENTIAL (CANCER CENTER ONLY)
Abs Immature Granulocytes: 0.03 10*3/uL (ref 0.00–0.07)
Basophils Absolute: 0 10*3/uL (ref 0.0–0.1)
Basophils Relative: 0 %
Eosinophils Absolute: 0.5 10*3/uL (ref 0.0–0.5)
Eosinophils Relative: 6 %
HCT: 37.1 % — ABNORMAL LOW (ref 39.0–52.0)
Hemoglobin: 11.9 g/dL — ABNORMAL LOW (ref 13.0–17.0)
Immature Granulocytes: 0 %
Lymphocytes Relative: 27 %
Lymphs Abs: 2.4 10*3/uL (ref 0.7–4.0)
MCH: 31.2 pg (ref 26.0–34.0)
MCHC: 32.1 g/dL (ref 30.0–36.0)
MCV: 97.4 fL (ref 80.0–100.0)
Monocytes Absolute: 1.3 10*3/uL — ABNORMAL HIGH (ref 0.1–1.0)
Monocytes Relative: 15 %
Neutro Abs: 4.5 10*3/uL (ref 1.7–7.7)
Neutrophils Relative %: 52 %
Platelet Count: 382 10*3/uL (ref 150–400)
RBC: 3.81 MIL/uL — ABNORMAL LOW (ref 4.22–5.81)
RDW: 13.8 % (ref 11.5–15.5)
WBC Count: 8.7 10*3/uL (ref 4.0–10.5)
nRBC: 0 % (ref 0.0–0.2)

## 2020-03-22 LAB — CMP (CANCER CENTER ONLY)
ALT: 30 U/L (ref 0–44)
AST: 68 U/L — ABNORMAL HIGH (ref 15–41)
Albumin: 3.6 g/dL (ref 3.5–5.0)
Alkaline Phosphatase: 143 U/L — ABNORMAL HIGH (ref 38–126)
Anion gap: 12 (ref 5–15)
BUN: 13 mg/dL (ref 6–20)
CO2: 24 mmol/L (ref 22–32)
Calcium: 9.4 mg/dL (ref 8.9–10.3)
Chloride: 97 mmol/L — ABNORMAL LOW (ref 98–111)
Creatinine: 0.87 mg/dL (ref 0.61–1.24)
GFR, Est AFR Am: >60 mL/min (ref >60)
GFR, Estimated: >60 mL/min (ref >60)
Glucose, Bld: 104 mg/dL — ABNORMAL HIGH (ref 70–99)
Potassium: 3.8 mmol/L (ref 3.5–5.1)
Sodium: 133 mmol/L — ABNORMAL LOW (ref 135–145)
Total Bilirubin: 0.5 mg/dL (ref 0.3–1.2)
Total Protein: 9.3 g/dL — ABNORMAL HIGH (ref 6.5–8.1)

## 2020-03-22 LAB — TSH: TSH: 1.297 u[IU]/mL (ref 0.320–4.118)

## 2020-03-22 MED ORDER — SODIUM CHLORIDE 0.9 % IV SOLN
500.0000 mg/m2 | Freq: Once | INTRAVENOUS | Status: AC
  Administered 2020-03-22: 14:00:00 1100 mg via INTRAVENOUS
  Filled 2020-03-22: qty 40

## 2020-03-22 MED ORDER — PROCHLORPERAZINE MALEATE 10 MG PO TABS
10.0000 mg | ORAL_TABLET | Freq: Once | ORAL | Status: AC
  Administered 2020-03-22: 13:00:00 10 mg via ORAL

## 2020-03-22 MED ORDER — PROCHLORPERAZINE MALEATE 10 MG PO TABS
ORAL_TABLET | ORAL | Status: AC
  Filled 2020-03-22: qty 1

## 2020-03-22 MED ORDER — SODIUM CHLORIDE 0.9 % IV SOLN
Freq: Once | INTRAVENOUS | Status: AC
  Filled 2020-03-22: qty 250

## 2020-03-22 MED ORDER — SODIUM CHLORIDE 0.9 % IV SOLN
200.0000 mg | Freq: Once | INTRAVENOUS | Status: AC
  Administered 2020-03-22: 200 mg via INTRAVENOUS
  Filled 2020-03-22: qty 8

## 2020-03-22 NOTE — Progress Notes (Signed)
Per Dr Julien Nordmann it is okay to treat pt today with Alimta and keytruda and heart rate of 107.

## 2020-03-22 NOTE — Patient Instructions (Signed)
COVID-19 Vaccine Information can be found at: [email or web address] For questions related to vaccine distribution or appointments, please email [email or web address] or call [phone].   [facility] Discharge Instructions for Patients Receiving Immunotherapy and Chemotherapy  Today you received the following immunotherapy and chemotherapy agents: Pembrolizumab (Keytruda) and Pemetrexed (Alimta)  To help prevent nausea and vomiting after your treatment, we encourage you to take your nausea medication as directed by your provider.    If you develop nausea and vomiting that is not controlled by your nausea medication, call the clinic.   BELOW ARE SYMPTOMS THAT SHOULD BE REPORTED IMMEDIATELY:  *FEVER GREATER THAN 100.5 F  *CHILLS WITH OR WITHOUT FEVER  NAUSEA AND VOMITING THAT IS NOT CONTROLLED WITH YOUR NAUSEA MEDICATION  *UNUSUAL SHORTNESS OF BREATH  *UNUSUAL BRUISING OR BLEEDING  TENDERNESS IN MOUTH AND THROAT WITH OR WITHOUT PRESENCE OF ULCERS  *URINARY PROBLEMS  *BOWEL PROBLEMS  UNUSUAL RASH Items with * indicate a potential emergency and should be followed up as soon as possible.  Feel free to call the clinic should you have any questions or concerns. The clinic phone number is [phone].  Please show the [facility] at check-in to the Emergency Department and triage nurse.  Coronavirus (COVID-19) Are you at risk?  Are you at risk for the Coronavirus (COVID-19)?  To be considered HIGH RISK for Coronavirus (COVID-19), you have to meet the following criteria:  . Traveled to [place], [place], [place], [place] or [place]; or in the [place] to [place], [place], [place], or [place]; and have fever, cough, and shortness of breath within the last 2 weeks of travel OR . Been in close contact with a person diagnosed with COVID-19 within the last 2 weeks and have fever, cough, and  shortness of breath . IF YOU DO NOT MEET THESE CRITERIA, YOU ARE CONSIDERED LOW RISK FOR COVID-19.  What to do if you are HIGH RISK for COVID-19?  Marland Kitchen If you are having a medical emergency, call 911. . Seek medical care right away. Before you go to a doctor's office, urgent care or emergency department, call ahead and tell them about your recent travel, contact with someone diagnosed with COVID-19, and your symptoms. You should receive instructions from your physician's office regarding next steps of care.  . When you arrive at healthcare provider, tell the healthcare staff immediately you have returned from visiting [place], [place], [place], [place] or [place]; or traveled in the [place] to [place], [place], [place], or [place]; in the last two weeks or you have been in close contact with a person diagnosed with COVID-19 in the last 2 weeks.   . Tell the health care staff about your symptoms: fever, cough and shortness of breath. . After you have been seen by a medical provider, you will be either: o Tested for (COVID-19) and discharged home on quarantine except to seek medical care if symptoms worsen, and asked to  - Stay home and avoid contact with others until you get your results (4-5 days)  - Avoid travel on public transportation if possible (such as bus, train, or airplane) or o Sent to the Emergency Department by EMS for evaluation, COVID-19 testing, and possible admission depending on your condition and test results.  What to do if you are LOW RISK for COVID-19?  Reduce your risk of any infection by using the same precautions used for avoiding the common cold or flu:  Marland Kitchen Wash your hands  often with soap and warm water for at least 20 seconds.  If soap and water are not readily available, use an alcohol-based hand sanitizer with at least 60% alcohol.  . If coughing or sneezing, cover your mouth and nose by coughing or sneezing into the elbow areas of your shirt or coat, into a  tissue or into your sleeve (not your hands). . Avoid shaking hands with others and consider head nods or verbal greetings only. . Avoid touching your eyes, nose, or mouth with unwashed hands.  . Avoid close contact with people who are sick. . Avoid places or events with large numbers of people in one location, like concerts or sporting events. . Carefully consider travel plans you have or are making. . If you are planning any travel outside or inside the [place], visit the CDC's Travelers' Health webpage for the latest health notices. . If you have some symptoms but not all symptoms, continue to monitor at home and seek medical attention if your symptoms worsen. . If you are having a medical emergency, call 911.   [place] / e-Visit: [email or web address]         [facility]: [facility]: [phone]                   [facility]: [phone]

## 2020-03-22 NOTE — Progress Notes (Signed)
[place] [phone]   [phone]  OFFICE PROGRESS NOTE  [name], [address] [place] [address]  DIAGNOSIS: Stage IV (T2b, N3, M1 C) non-small cell lung cancer, adenocarcinoma presented with left upper lobe lung mass in addition to mediastinal and left supraclavicular lymphadenopathy as well as bilateral pulmonary nodules and suspicious bone lesion diagnosed in March 2020.  PRIOR THERAPY: None  CURRENT THERAPY: Systemic chemotherapy with carboplatin for AUC of 5, Alimta 500 mg/M2 and Keytruda 200 mg IV every 3 weeks.  First dose March 11, 2019.  Status post 18 cycles.  Starting from cycle #5 the patient is on maintenance treatment with Alimta and Keytruda every 3 weeks.  INTERVAL HISTORY: Willie Schmidt 59 y.o. male returns to the clinic today for follow-up visit accompanied by his daughter.  The other daughter was available by phone during the visit.  The patient continues to complain of the right shoulder pain and he is expected to have steroid injection soon by his orthopedic surgeon.  He also have generalized fatigue and weakness.  He has been tolerating his systemic chemotherapy fairly well with no significant adverse effect except for mild fatigue.  He denied having any current chest pain, shortness of breath, cough or hemoptysis.  He denied having any fever or chills.  He has no nausea, vomiting, diarrhea or constipation.  He denied having any headache or visual changes.  He had repeat CT scan of the chest, abdomen and pelvis performed recently and he is here for evaluation and discussion of his discuss results.  MEDICAL HISTORY: Past Medical History:  Diagnosis Date  . Bronchitis, chronic ([place])   . Hypertension   . metastatic lung ca dx'd 02/2019   lyphadenopathy, bil pulmonary noduels; suspected bone lesions    ALLERGIES:  has No Known Allergies.  MEDICATIONS:  Current Outpatient Medications  Medication Sig Dispense  Refill  . albuterol (PROVENTIL HFA;VENTOLIN HFA) 108 (90 BASE) MCG/ACT inhaler Inhale 2 puffs into the lungs every 6 (six) hours as needed for wheezing. 1 Inhaler 2  . amLODipine (NORVASC) 5 MG tablet TK 1 T PO Q NIGHT    . aspirin 81 MG tablet Take 81 mg by mouth daily.    . cyclobenzaprine (FLEXERIL) 10 MG tablet TAKE 1 TABLET BY MOUTH THREE TIMES DAILY AS NEEDED FOR MUSCLE SPASMS 30 tablet 0  . DULoxetine (CYMBALTA) 30 MG capsule 1 ONCE DAILY FOR 1 WEEK THEN INCREASE TO 1 TWICE DAILY 60 capsule 3  . folic acid (FOLVITE) 1 MG tablet TAKE 1 TABLET(1 MG) BY MOUTH DAILY 30 tablet 4  . furosemide (LASIX) 20 MG tablet Take 1 tablet (20 mg total) by mouth daily. 7 tablet 0  . gabapentin (NEURONTIN) 300 MG capsule Take 1 capsule (300 mg total) by mouth 3 (three) times daily. 90 capsule 3  . indomethacin (INDOCIN) 50 MG capsule Take 1 capsule (50 mg total) by mouth 3 (three) times daily with meals. 12 capsule 0  . lisinopril-hydrochlorothiazide (ZESTORETIC) 20-25 MG tablet Take 1 tablet by mouth daily.    . meloxicam (MOBIC) 15 MG tablet TK 1 T PO QD WF    . oxyCODONE-acetaminophen (PERCOCET/ROXICET) 5-325 MG tablet Take 1 tablet by mouth every 6 (six) hours as needed for severe pain. 30 tablet 0  . potassium chloride SA (KLOR-CON) 20 MEQ tablet 1 Tablet once daily while taking Lasix 30 tablet 1  . prochlorperazine (COMPAZINE) 10 MG tablet TAKE 1  TABLET(10 MG) BY MOUTH EVERY 6 HOURS AS NEEDED FOR NAUSEA OR VOMITING 30 tablet 0   No current facility-administered medications for this visit.    SURGICAL HISTORY: History reviewed. No pertinent surgical history.  REVIEW OF SYSTEMS:  Constitutional: positive for fatigue Eyes: negative Ears, nose, mouth, throat, and face: negative Respiratory: negative Cardiovascular: negative Gastrointestinal: negative Genitourinary:negative Integument/breast: negative Hematologic/lymphatic: negative Musculoskeletal:positive for arthralgias Neurological:  negative Behavioral/Psych: negative Endocrine: negative Allergic/Immunologic: negative   PHYSICAL EXAMINATION: General appearance: alert, cooperative, fatigued and no distress Head: Normocephalic, without obvious abnormality, atraumatic Neck: no adenopathy, no JVD, supple, symmetrical, trachea midline and thyroid not enlarged, symmetric, no tenderness/mass/nodules Lymph nodes: Cervical, supraclavicular, and axillary nodes normal. Resp: clear to auscultation bilaterally Back: symmetric, no curvature. ROM normal. No CVA tenderness. Cardio: regular rate and rhythm, S1, S2 normal, no murmur, click, rub or gallop GI: soft, non-tender; bowel sounds normal; no masses,  no organomegaly Extremities: edema 1+ edema Neurologic: Alert and oriented X 3, normal strength and tone. Normal symmetric reflexes. Normal coordination and gait  ECOG PERFORMANCE STATUS: 1 - Symptomatic but completely ambulatory  Blood pressure (!) 145/97, pulse (!) 107, temperature 98 F (36.7 C), temperature source Temporal, resp. rate 20, height 5\' 9"  (1.753 m), weight 259 lb 4.8 oz (117.6 kg), SpO2 100 %.  LABORATORY DATA: Lab Results  Component Value Date   WBC 8.7 03/22/2020   HGB 11.9 (L) 03/22/2020   HCT 37.1 (L) 03/22/2020   MCV 97.4 03/22/2020   PLT 382 03/22/2020      Chemistry      Component Value Date/Time   NA 133 (L) 03/22/2020 1006   K 3.8 03/22/2020 1006   CL 97 (L) 03/22/2020 1006   CO2 24 03/22/2020 1006   BUN 13 03/22/2020 1006   CREATININE 0.87 03/22/2020 1006      Component Value Date/Time   CALCIUM 9.4 03/22/2020 1006   ALKPHOS 143 (H) 03/22/2020 1006   AST 68 (H) 03/22/2020 1006   ALT 30 03/22/2020 1006   BILITOT 0.5 03/22/2020 1006       RADIOGRAPHIC STUDIES: CT Chest W Contrast  Result Date: 03/17/2020 CLINICAL DATA:  Lung cancer restaging, history of non-small cell lung cancer presenting with left upper lobe mass initially a noted have mediastinal and left supraclavicular  adenopathy as well as bilateral pulmonary nodules. Currently undergoing systemic therapy. EXAM: CT CHEST, ABDOMEN, AND PELVIS WITH CONTRAST TECHNIQUE: Multidetector CT imaging of the chest, abdomen and pelvis was performed following the standard protocol during bolus administration of intravenous contrast. CONTRAST:  72mL OMNIPAQUE IOHEXOL 300 MG/ML  SOLN COMPARISON:  Is 01/17/2020 and multiple prior studies. FINDINGS: CT CHEST FINDINGS Cardiovascular: Calcified and noncalcified plaque throughout the thoracic aorta. Soft tissue abutting the aorta is similar to the prior exam, see below. Central pulmonary vasculature is normal accounting for venous phase evaluation. Heart size stable with small pericardial effusion. Mediastinum/Nodes: Soft Gus is normal. AP window soft tissue that abuts the aorta is unchanged, difficult to measure given appearance and distribution Left supraclavicular lymph nodes and thoracic inlet lymph nodes have increased in number and in size since the prior study (image 4, series 3) 13 mm left supraclavicular lymph node immediately behind the clavicle was not present on the prior exam other small lymph nodes in this location or more numerous. The lymph node that was 7 mm on the prior study in this area (image 5, series 3) now 10 mm short axis. At least 5 additional lymph nodes with rounded morphology  are present. This area is incompletely imaged. Small right hilar lymph node just less than a cm is unchanged. Right paratracheal lymph nodes are stable no right axillary adenopathy or left axillary adenopathy. Lungs/Pleura: Ground-glass attenuation in the right upper lobe along the major fissure on the prior study is now a solid nodule measuring 11 x 8 mm (image 46, series 5) New right middle lobe ground-glass nodule (image 67, series 5) 8 mm. Airways are patent. Enlarging left pleural effusion and septal thickening. Pleural effusion is small. Irregular septal thickening is present in the left upper  lobe and there is now a discrete nodule in the left upper lobe that was not present on the prior study measuring 7-8 mm (image 23, series 5): Musculoskeletal: No chest wall lesion. See below for full musculoskeletal details. CT ABDOMEN PELVIS FINDINGS Hepatobiliary: Lobular hepatic contours are similar to the previous exam showing no suspicious focal hepatic lesion. Tiny area of low attenuation in the dome of the right hemi liver is likely benign given stability. Lobulated hepatic contours raise the question of medical liver disease. No pericholecystic stranding. No ductal dilation. Pancreas: Pancreas is normal without signs of inflammation ductal dilation or lesion. Spleen: Spleen is normal size without focal lesion. Adrenals/Urinary Tract: Adrenal glands are normal. Bilateral perinephric stranding is unchanged. No associated hydronephrosis or suspicious renal lesion. Urinary bladder is under distended limiting assessment. Stomach/Bowel: No acute gastrointestinal process. Stool fills much of the colon. Vascular/Lymphatic: Calcified and noncalcified atheromatous plaque in the abdominal aorta. No evidence of aneurysm. No adenopathy though there is increase in number and size of retroperitoneal lymph nodes all remaining less than a cm, for example along the left periaortic chain and common iliac chain there is a 6 mm lymph node. (Image 80, series 3): 8 mm retroaortic lymph node not present on the previous study. Other small lymph nodes with rounded morphology remain nonspecific. Pelvic lymph nodes show interval enlargement, left pelvic sidewall lymph node (image 107, series 3): 11 mm. Lymph node in the left inguinal canal (image 112, series 3) 13 mm. Scattered lymph nodes about the right pelvis with similar distribution though smaller. Reproductive: Nonspecific prostate heterogeneity. Other: No abdominal wall hernia or abnormality. No abdominopelvic ascites. Musculoskeletal: Unchanged sclerosis of T10. No new or acute  destructive bone finding. IMPRESSION: 1. Enlarging left supraclavicular and thoracic inlet lymph nodes concerning for worsening of disease. 2. New ground-glass attenuation nodule in the right upper lobe along the major fissure measuring 11 x 8 mm. New 7-8 mm ground-glass nodule in the right middle lobe. Findings are concerning for pulmonary metastatic disease. 3. Enlarging left pleural effusion and septal thickening. Correlate with any evidence of pneumonitis clinically. Findings given the nodules in various areas of the lung remain concerning for worsening of disease. 4. Increased number and size of retroperitoneal and pelvic lymph nodes while an atypical distribution for nodal metastases is concerning none the less given other findings. Enlarged lymph nodes are also noted in the left inguinal canal. 5. Unchanged sclerosis of T10. No new destructive bone finding. 6. Probable steatosis with lobular hepatic contours. Correlate with any clinical evidence of hepatic dysfunction. Findings are similar to previous exams. Electronically Signed   By: Zetta Bills M.D.   On: 03/17/2020 17:20   CT Abdomen Pelvis W Contrast  Result Date: 03/17/2020 CLINICAL DATA:  Lung cancer restaging, history of non-small cell lung cancer presenting with left upper lobe mass initially a noted have mediastinal and left supraclavicular adenopathy as well as bilateral pulmonary  nodules. Currently undergoing systemic therapy. EXAM: CT CHEST, ABDOMEN, AND PELVIS WITH CONTRAST TECHNIQUE: Multidetector CT imaging of the chest, abdomen and pelvis was performed following the standard protocol during bolus administration of intravenous contrast. CONTRAST:  75mL OMNIPAQUE IOHEXOL 300 MG/ML  SOLN COMPARISON:  Is 01/17/2020 and multiple prior studies. FINDINGS: CT CHEST FINDINGS Cardiovascular: Calcified and noncalcified plaque throughout the thoracic aorta. Soft tissue abutting the aorta is similar to the prior exam, see below. Central pulmonary  vasculature is normal accounting for venous phase evaluation. Heart size stable with small pericardial effusion. Mediastinum/Nodes: Soft Gus is normal. AP window soft tissue that abuts the aorta is unchanged, difficult to measure given appearance and distribution Left supraclavicular lymph nodes and thoracic inlet lymph nodes have increased in number and in size since the prior study (image 4, series 3) 13 mm left supraclavicular lymph node immediately behind the clavicle was not present on the prior exam other small lymph nodes in this location or more numerous. The lymph node that was 7 mm on the prior study in this area (image 5, series 3) now 10 mm short axis. At least 5 additional lymph nodes with rounded morphology are present. This area is incompletely imaged. Small right hilar lymph node just less than a cm is unchanged. Right paratracheal lymph nodes are stable no right axillary adenopathy or left axillary adenopathy. Lungs/Pleura: Ground-glass attenuation in the right upper lobe along the major fissure on the prior study is now a solid nodule measuring 11 x 8 mm (image 46, series 5) New right middle lobe ground-glass nodule (image 67, series 5) 8 mm. Airways are patent. Enlarging left pleural effusion and septal thickening. Pleural effusion is small. Irregular septal thickening is present in the left upper lobe and there is now a discrete nodule in the left upper lobe that was not present on the prior study measuring 7-8 mm (image 23, series 5): Musculoskeletal: No chest wall lesion. See below for full musculoskeletal details. CT ABDOMEN PELVIS FINDINGS Hepatobiliary: Lobular hepatic contours are similar to the previous exam showing no suspicious focal hepatic lesion. Tiny area of low attenuation in the dome of the right hemi liver is likely benign given stability. Lobulated hepatic contours raise the question of medical liver disease. No pericholecystic stranding. No ductal dilation. Pancreas: Pancreas is  normal without signs of inflammation ductal dilation or lesion. Spleen: Spleen is normal size without focal lesion. Adrenals/Urinary Tract: Adrenal glands are normal. Bilateral perinephric stranding is unchanged. No associated hydronephrosis or suspicious renal lesion. Urinary bladder is under distended limiting assessment. Stomach/Bowel: No acute gastrointestinal process. Stool fills much of the colon. Vascular/Lymphatic: Calcified and noncalcified atheromatous plaque in the abdominal aorta. No evidence of aneurysm. No adenopathy though there is increase in number and size of retroperitoneal lymph nodes all remaining less than a cm, for example along the left periaortic chain and common iliac chain there is a 6 mm lymph node. (Image 80, series 3): 8 mm retroaortic lymph node not present on the previous study. Other small lymph nodes with rounded morphology remain nonspecific. Pelvic lymph nodes show interval enlargement, left pelvic sidewall lymph node (image 107, series 3): 11 mm. Lymph node in the left inguinal canal (image 112, series 3) 13 mm. Scattered lymph nodes about the right pelvis with similar distribution though smaller. Reproductive: Nonspecific prostate heterogeneity. Other: No abdominal wall hernia or abnormality. No abdominopelvic ascites. Musculoskeletal: Unchanged sclerosis of T10. No new or acute destructive bone finding. IMPRESSION: 1. Enlarging left supraclavicular and thoracic  inlet lymph nodes concerning for worsening of disease. 2. New ground-glass attenuation nodule in the right upper lobe along the major fissure measuring 11 x 8 mm. New 7-8 mm ground-glass nodule in the right middle lobe. Findings are concerning for pulmonary metastatic disease. 3. Enlarging left pleural effusion and septal thickening. Correlate with any evidence of pneumonitis clinically. Findings given the nodules in various areas of the lung remain concerning for worsening of disease. 4. Increased number and size of  retroperitoneal and pelvic lymph nodes while an atypical distribution for nodal metastases is concerning none the less given other findings. Enlarged lymph nodes are also noted in the left inguinal canal. 5. Unchanged sclerosis of T10. No new destructive bone finding. 6. Probable steatosis with lobular hepatic contours. Correlate with any clinical evidence of hepatic dysfunction. Findings are similar to previous exams. Electronically Signed   By: Zetta Bills M.D.   On: 03/17/2020 17:20    ASSESSMENT AND PLAN: This is a very pleasant 59 years old African-American male with likely stage IV non-small cell lung cancer, adenocarcinoma presented with large left upper lobe lung mass in addition to mediastinal and left supraclavicular lymphadenopathy as well as suspicious bone metastasis and bilateral pulmonary nodules diagnosed in March 2020. The patient is currently undergoing systemic chemotherapy with carboplatin, Alimta and Keytruda status post 18 cycles.  Starting from cycle #5 he is receiving maintenance treatment with Alimta and Keytruda. The patient has been tolerating his treatment well with no concerning adverse effects except for mild fatigue. He had repeat CT scan of the chest, abdomen and pelvis performed recently.  I personally and independently reviewed the scan images and discussed the results with the patient and his daughters.  His scan showed mild increase in some of the lymph nodes in the abdomen as well as the neck area. I had a lengthy discussion with the patient and his family about his current condition and treatment options.  The patient was given the option of continuing his current treatment with maintenance Alimta and Keytruda with close monitoring of the progressive lymphadenopathy on the upcoming imaging studies and if there is any further disease progression we would have to discontinue his treatment and consider him for other treatment options or palliative care.  He was also given  the option of palliative care and hospice referral versus second line treatment with docetaxel and Cyramza.  I discussed with him the adverse effect of the docetaxel and Cyramza including but not limited to alopecia, myelosuppression, nausea and vomiting, peripheral neuropathy, liver or renal dysfunction as well as the hemorrhagic adverse effect of Cyramza.  The patient and his daughter had a private discussion and at the end of the discussion they decided to stay on his current treatment with maintenance Alimta and Keytruda for few more cycles. He will proceed with cycle #19 today as planned. For the pain management he will continue on Percocet on as-needed basis in addition to gabapentin. For the right shoulder pain he is followed by orthopedic surgery and expected to have steroid injection in this area soon. He will come back for follow-up visit in 3 weeks for evaluation before the next cycle of his treatment. The patient was advised to call immediately if he has any concerning symptoms in the interval.  The patient voices understanding of current disease status and treatment options and is in agreement with the current care plan. All questions were answered. The patient knows to call the clinic with any problems, questions or concerns. We can  certainly see the patient much sooner if necessary. The total time spent in the appointment was 45 minutes.  Disclaimer: This note was dictated with voice recognition software. Similar sounding words can inadvertently be transcribed and may not be corrected upon review.

## 2020-03-23 ENCOUNTER — Telehealth: Payer: Self-pay | Admitting: Internal Medicine

## 2020-03-23 NOTE — Telephone Encounter (Signed)
Scheduled per los. Called and left msg. Mailed printout  °

## 2020-03-27 ENCOUNTER — Telehealth: Payer: Self-pay | Admitting: Medical Oncology

## 2020-03-27 ENCOUNTER — Other Ambulatory Visit: Payer: Self-pay | Admitting: Internal Medicine

## 2020-03-27 DIAGNOSIS — M542 Cervicalgia: Secondary | ICD-10-CM

## 2020-03-27 DIAGNOSIS — C3492 Malignant neoplasm of unspecified part of left bronchus or lung: Secondary | ICD-10-CM

## 2020-03-27 MED ORDER — OXYCODONE-ACETAMINOPHEN 5-325 MG PO TABS: 1.0000 | ORAL_TABLET | Freq: Four times a day (QID) | ORAL | 0 refills | Status: DC | PRN

## 2020-03-27 NOTE — Telephone Encounter (Addendum)
requested refill for oxycodone.

## 2020-03-29 ENCOUNTER — Ambulatory Visit (INDEPENDENT_AMBULATORY_CARE_PROVIDER_SITE_OTHER): Payer: Medicaid Other | Admitting: Orthopedic Surgery

## 2020-03-29 ENCOUNTER — Encounter: Payer: Self-pay | Admitting: Orthopedic Surgery

## 2020-03-29 ENCOUNTER — Other Ambulatory Visit: Payer: Self-pay

## 2020-03-29 DIAGNOSIS — M19011 Primary osteoarthritis, right shoulder: Secondary | ICD-10-CM

## 2020-03-29 NOTE — Progress Notes (Signed)
Office Visit Note   Patient: Willie Schmidt           Date of Birth: [birth year]           MRN: [id number] Visit Date: 03/29/2020 Requested by: [name], MD [address] [place],  [place] [address] PCP: [name], MD  Subjective: Chief Complaint  Patient presents with  . Right Shoulder - Pain    HPI: Willie Schmidt is a 59 year old patient with shoulder and neck and arm pain.  Also has advanced stage lung cancer.  He is under some chemotherapy treatment for that.  Saw him in November.  Moderate AC joint arthritis noted at that time.  Good relief with anesthetic portion of the injection but no sustained relief from the cortisone part of the injection into the [facility] joint.  He is having some numbness and tingling in both hands.  He is not really on a continue on with the chemo protocol which is ongoing.  Denies much in the way of mechanical symptoms in the shoulder.              ROS: All systems reviewed are negative as they relate to the chief complaint within the history of present illness.  Patient denies  fevers or chills.   Assessment & Plan: Visit Diagnoses:  1. Arthritis of right acromioclavicular joint     Plan: Impression is patient has more trigger point type symptoms and is neck region as opposed to true shoulder pathology.  I do not think any intervention is warranted for shoulder pathology at this time.  Range of motion is full.  Plan is activity as tolerated.  Come back for episodic injections in the shoulder if needed.  Follow-Up Instructions: Return if symptoms worsen or fail to improve.   Orders:  No orders of the defined types were placed in this encounter.  No orders of the defined types were placed in this encounter.     Procedures: No procedures performed   Clinical Data: No additional findings.  Objective: Vital Signs: There were no vitals taken for this visit.  Physical Exam:   Constitutional: Patient appears well-developed HEENT:  Head:  Normocephalic Eyes:EOM are normal Neck: Normal range of motion Cardiovascular: Normal rate Pulmonary/chest: Effort normal Neurologic: Patient is alert Skin: Skin is warm Psychiatric: Patient has normal mood and affect    Ortho Exam: Ortho exam demonstrates pretty full cervical spine range of motion.  Mild subjective paresthesias in both hands dorsal and palmar surface.  Bilateral shoulder range of motion is full.  No coarse grinding or crepitus with active or passive range of motion of the shoulder.  No AC joint tenderness to direct palpation or with crossarm adduction.  Specialty Comments:  No specialty comments available.  Imaging: No results found.   PMFS History: Patient Active Problem List   Diagnosis Date Noted  . Right shoulder pain 04/22/2019  . Adenocarcinoma of left lung, stage 4 ([place]) 03/03/2019  . Encounter for antineoplastic chemotherapy 03/03/2019  . Encounter for antineoplastic immunotherapy 03/03/2019  . Goals of care, counseling/discussion 03/03/2019  . Neck pain on right side 03/03/2019  . Neck pain 02/17/2019   Past Medical History:  Diagnosis Date  . Bronchitis, chronic ([place])   . Hypertension   . metastatic lung ca dx'd 02/2019   lyphadenopathy, bil pulmonary noduels; suspected bone lesions    Family History  Problem Relation Age of Onset  . Chronic Renal Failure Mother     No past surgical  history on file. Social History   Occupational History  . Not on file  Tobacco Use  . Smoking status: Former Smoker    Types: Cigarettes  . Smokeless tobacco: Never Used  Substance and Sexual Activity  . Alcohol use: Yes  . Drug use: No  . Sexual activity: Not on file

## 2020-03-30 ENCOUNTER — Other Ambulatory Visit: Payer: Self-pay | Admitting: *Deleted

## 2020-03-30 DIAGNOSIS — R600 Localized edema: Secondary | ICD-10-CM

## 2020-04-05 ENCOUNTER — Ambulatory Visit (INDEPENDENT_AMBULATORY_CARE_PROVIDER_SITE_OTHER): Payer: Medicaid Other | Admitting: Physician Assistant

## 2020-04-05 ENCOUNTER — Other Ambulatory Visit: Payer: Self-pay | Admitting: Medical Oncology

## 2020-04-05 ENCOUNTER — Ambulatory Visit (HOSPITAL_COMMUNITY)
Admission: RE | Admit: 2020-04-05 | Discharge: 2020-04-05 | Disposition: A | Discharge status: Home / Self Care | Payer: Medicaid Other | Source: Ambulatory Visit | Attending: Surgery | Admitting: Surgery

## 2020-04-05 ENCOUNTER — Other Ambulatory Visit: Payer: Self-pay

## 2020-04-05 VITALS — BP 142/94 | HR 100 | Temp 97.8°F | Resp 20 | Wt 250.8 lb

## 2020-04-05 DIAGNOSIS — R600 Localized edema: Secondary | ICD-10-CM | POA: Diagnosis present

## 2020-04-05 DIAGNOSIS — I872 Venous insufficiency (chronic) (peripheral): Secondary | ICD-10-CM | POA: Diagnosis not present

## 2020-04-05 NOTE — Telephone Encounter (Signed)
[facility] notified that Dr Julien Nordmann will be Hospice attending.

## 2020-04-05 NOTE — Progress Notes (Signed)
Requested by:  [name], MD [address] [place],  [place] [address]  Reason for consultation: Bilateral lower extremity discomfort.   History of Present Illness   Willie Schmidt is a 59 y.o. (September 26, 1961) male who presents for evaluation of lower extremity swelling and pain.  Duration of symptoms approximately 6 to 9 months.  Describes pain as electrical shocks running down both knees from the knee level to feet.  He does not describe claudication type symptoms.  His mid medial legs often itch.  He denies skin breakdown of either leg.  No prior history of deep venous thrombosis.  Family history negative for venous disease.  He has not worn compression stockings in the past.  His current medical history if significant for Stage 4 adenocarcinoma of the left lung. Ongoing systemic chemotherapy (18 cycles)  Venous symptoms include: burning, itching, swelling  Onset/duration:  6-9 months  Aggravating factors :none Alleviating factors: none Compression:  none  Pain medications:  Neurontin, Indocin Previous vein procedures:  non History of DVT:  none  Past Medical History:  Diagnosis Date  . Bronchitis, chronic ([place])   . Hypertension   . metastatic lung ca dx'd 02/2019   lyphadenopathy, bil pulmonary noduels; suspected bone lesions    History reviewed. No pertinent surgical history.  Social History   Socioeconomic History  . Marital status: Single    Spouse name: Not on file  . Number of children: Not on file  . Years of education: Not on file  . Highest education level: Not on file  Occupational History  . Not on file  Tobacco Use  . Smoking status: Former Smoker    Types: Cigarettes  . Smokeless tobacco: Never Used  Substance and Sexual Activity  . Alcohol use: Yes  . Drug use: No  . Sexual activity: Not on file  Other Topics Concern  . Not on file  Social History Narrative  . Not on file   Social Determinants of Health   Financial Resource Strain:   .  Difficulty of Paying Living Expenses:   Food Insecurity:   . Worried About Charity fundraiser in the Last Year:   . Arboriculturist in the Last Year:   Transportation Needs:   . Film/video editor (Medical):   Marland Kitchen Lack of Transportation (Non-Medical):   Physical Activity:   . Days of Exercise per Week:   . Minutes of Exercise per Session:   Stress:   . Feeling of Stress :   Social Connections:   . Frequency of Communication with Friends and Family:   . Frequency of Social Gatherings with Friends and Family:   . Attends Religious Services:   . Active Member of Clubs or Organizations:   . Attends Archivist Meetings:   Marland Kitchen Marital Status:   Intimate Partner Violence:   . Fear of Current or Ex-Partner:   . Emotionally Abused:   Marland Kitchen Physically Abused:   . Sexually Abused:     Family History  Problem Relation Age of Onset  . Chronic Renal Failure Mother     Current Outpatient Medications  Medication Sig Dispense Refill  . albuterol (PROVENTIL HFA;VENTOLIN HFA) 108 (90 BASE) MCG/ACT inhaler Inhale 2 puffs into the lungs every 6 (six) hours as needed for wheezing. 1 Inhaler 2  . amLODipine (NORVASC) 5 MG tablet TK 1 T PO Q NIGHT    . aspirin 81 MG tablet Take 81 mg by mouth daily.    Marland Kitchen  cyclobenzaprine (FLEXERIL) 10 MG tablet TAKE 1 TABLET BY MOUTH THREE TIMES DAILY AS NEEDED FOR MUSCLE SPASMS 30 tablet 0  . DULoxetine (CYMBALTA) 30 MG capsule 1 ONCE DAILY FOR 1 WEEK THEN INCREASE TO 1 TWICE DAILY 60 capsule 3  . folic acid (FOLVITE) 1 MG tablet TAKE 1 TABLET(1 MG) BY MOUTH DAILY 30 tablet 4  . furosemide (LASIX) 20 MG tablet Take 1 tablet (20 mg total) by mouth daily. 7 tablet 0  . gabapentin (NEURONTIN) 300 MG capsule Take 1 capsule (300 mg total) by mouth 3 (three) times daily. 90 capsule 3  . indomethacin (INDOCIN) 50 MG capsule Take 1 capsule (50 mg total) by mouth 3 (three) times daily with meals. 12 capsule 0  . lisinopril-hydrochlorothiazide (ZESTORETIC) 20-25 MG  tablet Take 1 tablet by mouth daily.    . meloxicam (MOBIC) 15 MG tablet TK 1 T PO QD WF    . oxyCODONE-acetaminophen (PERCOCET/ROXICET) 5-325 MG tablet Take 1 tablet by mouth every 6 (six) hours as needed for severe pain. 30 tablet 0  . potassium chloride SA (KLOR-CON) 20 MEQ tablet 1 Tablet once daily while taking Lasix 30 tablet 1  . prochlorperazine (COMPAZINE) 10 MG tablet TAKE 1 TABLET(10 MG) BY MOUTH EVERY 6 HOURS AS NEEDED FOR NAUSEA OR VOMITING 30 tablet 0   No current facility-administered medications for this visit.    No Known Allergies  REVIEW OF SYSTEMS (negative unless checked):   Cardiac:  []  Chest pain or chest pressure? [x]  Shortness of breath upon activity? []  Shortness of breath when lying flat? []  Irregular heart rhythm?  Vascular:  []  Pain in calf, thigh, or hip brought on by walking? [x]  Pain in feet at night that wakes you up from your sleep? []  Blood clot in your veins? [x]  Leg swelling?  Pulmonary:  []  Oxygen at home? []  Productive cough? []  Wheezing?  Neurologic:  []  Sudden weakness in arms or legs? []  Sudden numbness in arms or legs? []  Sudden onset of difficult speaking or slurred speech? []  Temporary loss of vision in one eye? []  Problems with dizziness?  Gastrointestinal:  []  Blood in stool? []  Vomited blood?  Genitourinary:  []  Burning when urinating? []  Blood in urine?  Psychiatric:  []  Major depression  Hematologic:  []  Bleeding problems? []  Problems with blood clotting?  Dermatologic:  []  Rashes or ulcers?  Constitutional:  []  Fever or chills?  Ear/Nose/Throat:  []  Change in hearing? []  Nose bleeds? []  Sore throat?  Musculoskeletal:  []  Back pain? [x]  Joint pain? []  Muscle pain?   Physical Examination     Vitals:   04/05/20 1404  Weight: 250 lb 12.8 oz (113.8 kg)   General:  WDWN in NAD; vital signs documented above Gait: Steady, unaided HENT: WNL, normocephalic Pulmonary: normal non-labored breathing ,  without Rales, rhonchi,  wheezing Cardiac: regular HR, without  Murmurs without carotid bruits Abdomen: soft, NT, no masses Skin: without rashes Vascular Exam/Pulses:  Right Left  Radial 2+ (normal) absent  Ulnar absent absent  Femoral 2+ (normal) 2+ (normal)  Popliteal trace trace  DP 2+ (normal) 2+ (normal)  PT 1+ (weak) 1+ (weak)   Extremities: without varicose veins, without reticular veins, with edema, with stasis pigmentation, without lipodermatosclerosis, without ulcers    Musculoskeletal: no muscle wasting or atrophy  Neurologic: A&O X 3;  No focal weakness or paresthesias are detected Psychiatric:  The pt has Normal affect.  Non-invasive Vascular Imaging   BLE Venous Insufficiency Duplex (04/05/2020):  Left:  -  No evidence of deep vein thrombosis seen in the left lower extremity.  - No evidence of superficial venous thrombosis in the left lower  extremity.  - There is no evidence of deep venous reflux seen in the left lower  extremity.  - No evidence of superficial venous reflux seen in the left greater  saphenous vein.  - No evidence of superficial venous reflux seen in the left short  saphenous vein.     Medical Decision Making   Willie Schmidt is a 59 y.o. male who presents with: physical exam evidence of BLE chronic venous insufficiency.  The order for venous sonography was for the left lower extremity only although he has bilateral symptoms. He does not have signs and symptoms of significant peripheral arterial disease. I did not appreciate groin lymphadenopathy.   Based on the patient's history and examination, I recommend: elevation of the LEs above heart level as much as possible.  I discussed with the patient the use of 10-20 mm Hg knee high compression stockings.  I advised him to return for any skin breakdown of his ankles and to inspect his skin daily  We will see him in follow-up as necessary.  Thank you for allowing [place] to participate in this  patient's care.   Barbie Banner, PA-C [facility] Office: [phone]  04/05/2020, 1:37 PM  Clinic MD: Scot Dock

## 2020-04-05 NOTE — Telephone Encounter (Signed)
I LVM for Willie Schmidt to call me back and confirm that he wants to stop chemotherapy.   He called and said he is stopping  treatment and he appreciates everything Dr Julien Nordmann and his staff did for him.  Oxycodone refill requested .

## 2020-04-05 NOTE — Telephone Encounter (Signed)
Ok

## 2020-04-05 NOTE — Telephone Encounter (Signed)
Dtr reports "we are ending chemotherapy treatment. I have called in Hospice".

## 2020-04-11 ENCOUNTER — Telehealth: Payer: Self-pay | Admitting: Medical Oncology

## 2020-04-11 NOTE — Telephone Encounter (Signed)
Late entry -04/05/20-Drt and pt requested Hospice. I called Hospice and told them Dr Julien Nordmann will be attending.

## 2020-04-12 ENCOUNTER — Inpatient Hospital Stay: Payer: Medicaid Other

## 2020-04-12 ENCOUNTER — Inpatient Hospital Stay: Payer: Medicaid Other | Admitting: Internal Medicine

## 2020-04-17 ENCOUNTER — Telehealth: Payer: Self-pay | Admitting: Medical Oncology

## 2020-04-17 NOTE — Telephone Encounter (Signed)
[name] notified.

## 2020-04-17 NOTE — Telephone Encounter (Signed)
Dtr asking about life expectancy without chemo.

## 2020-04-17 NOTE — Telephone Encounter (Signed)
3-6 months

## 2020-04-27 IMAGING — CT CT ABD-PELV W/ CM
2 of 5 series · 13 of 36 positions shown, 16 images · IV contrast (omnipaque)
Comparison: 11/15/2019

CLINICAL DATA: Restaging lung cancer. Left leg swelling for 2
weeks.

EXAM:
CT CHEST, ABDOMEN, AND PELVIS WITH CONTRAST
TECHNIQUE: Multidetector CT imaging of the chest, abdomen and pelvis was
performed following the standard protocol during bolus
administration of intravenous contrast.
CONTRAST:  75mL OMNIPAQUE IOHEXOL 300 MG/ML  SOLN

[Series 2: cap with · axial · 0.84mm/px · z∈[+1123,+1643]mm · 10 of 128 slices shown, 13 images]
[im 12/128  mediastinal]
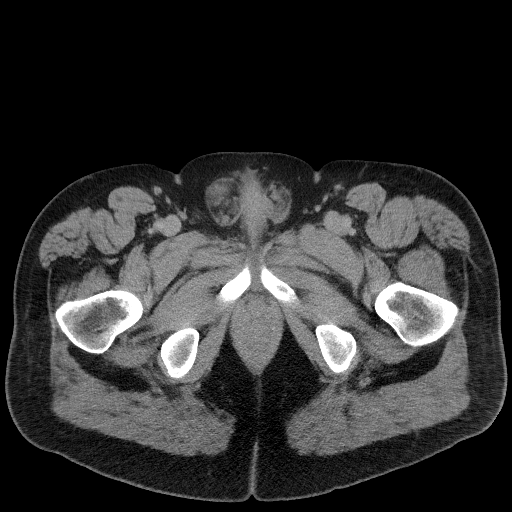
[im 12/128  lung]
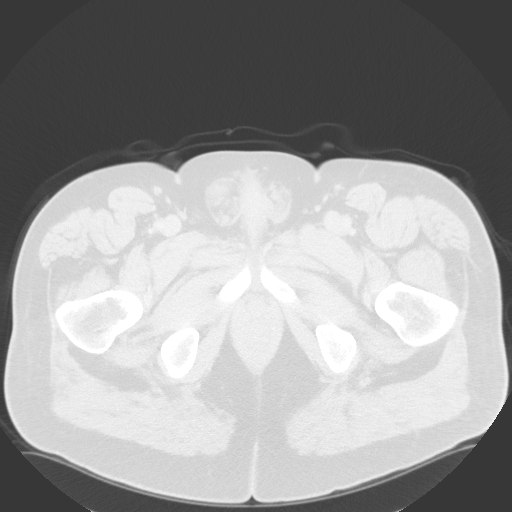
[im 24/128  lung]
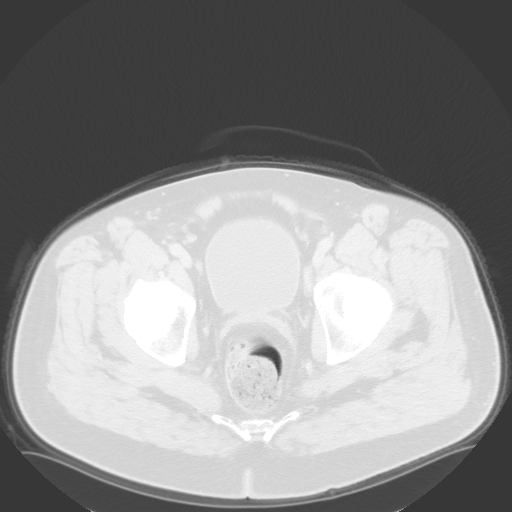
[im 35/128  lung]
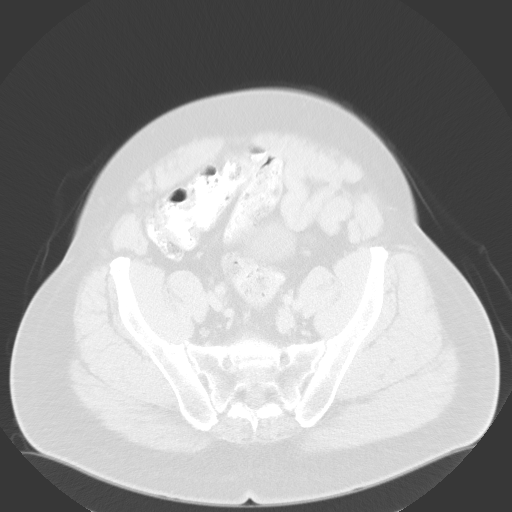
[im 47/128  lung]
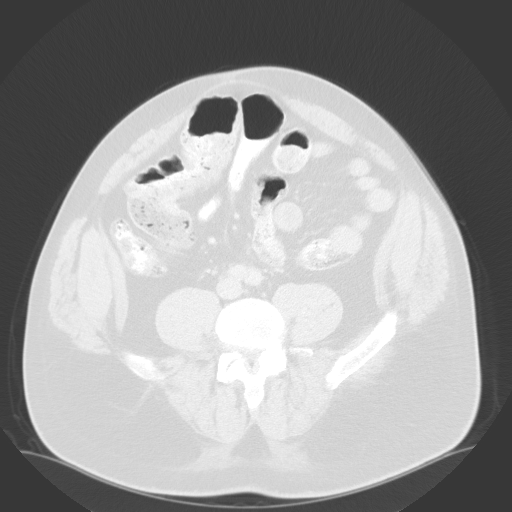
[im 58/128  mediastinal]
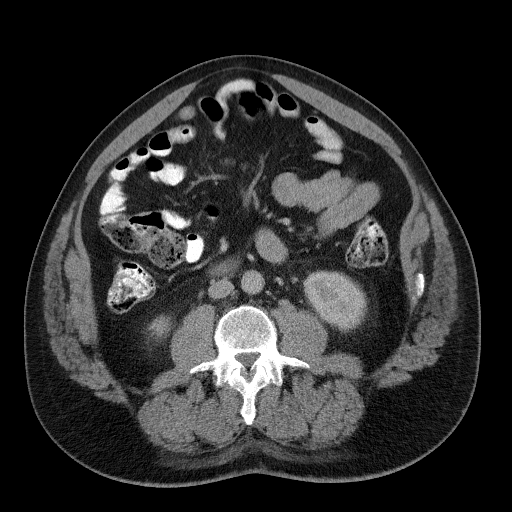
[im 58/128  lung]
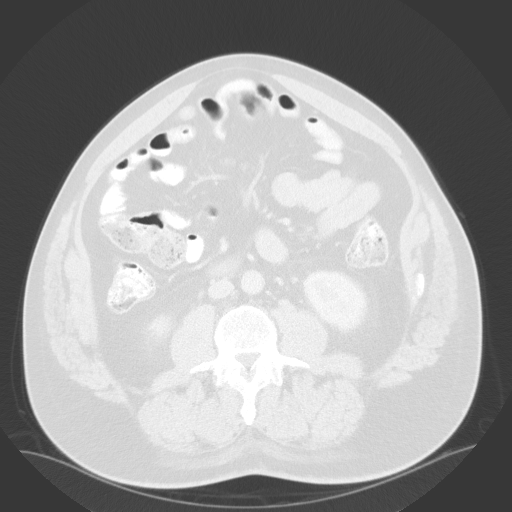
[im 70/128  lung]
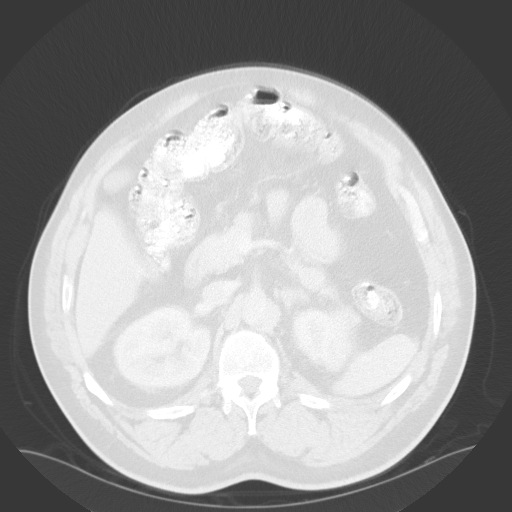
[im 81/128  lung]
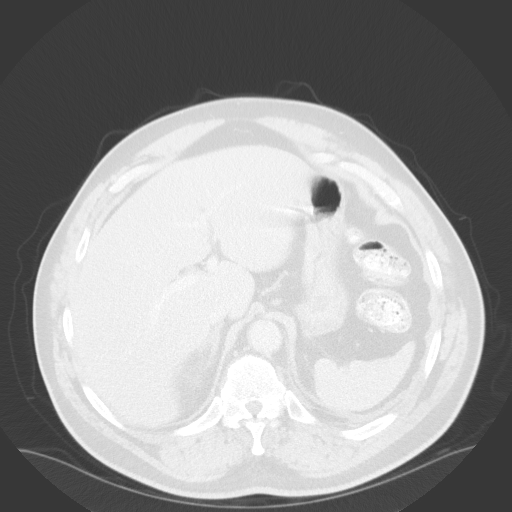
[im 93/128  lung]
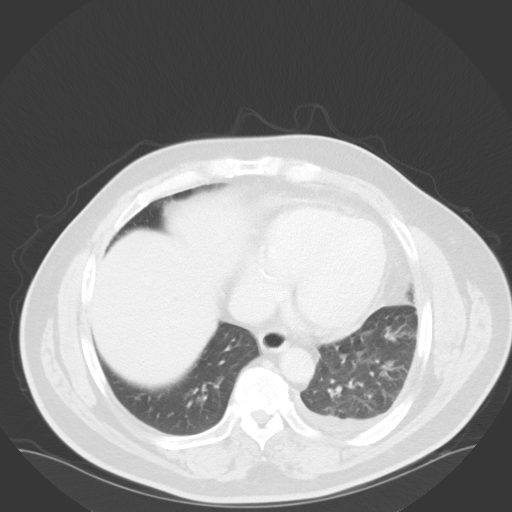
[im 104/128  mediastinal]
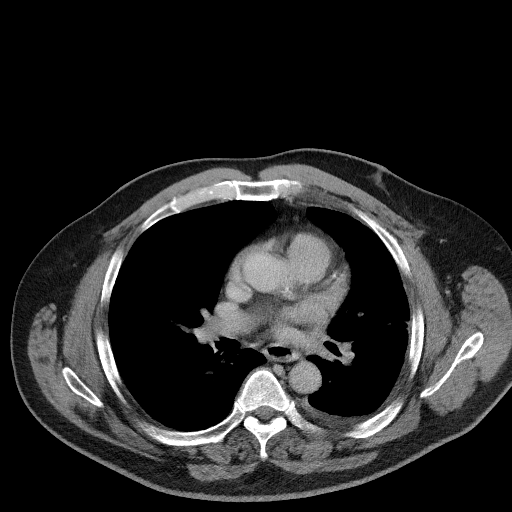
[im 104/128  lung]
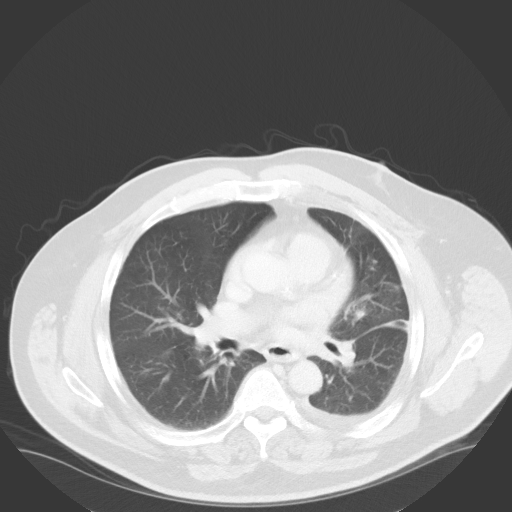
[im 116/128  lung]
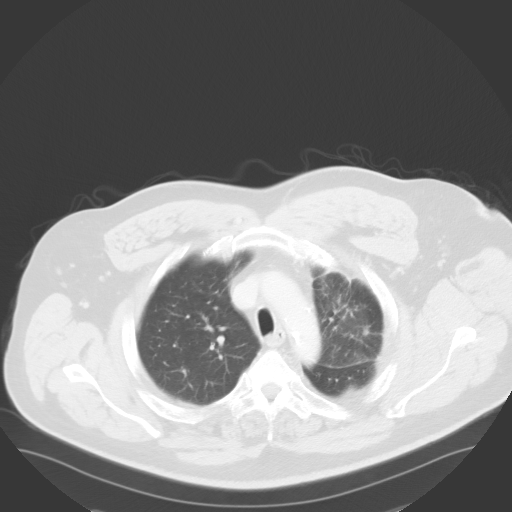

[Series 4: coronals · coronal · 0.94mm/px · 3 of 165 slices shown]
[im 33/165  lung]
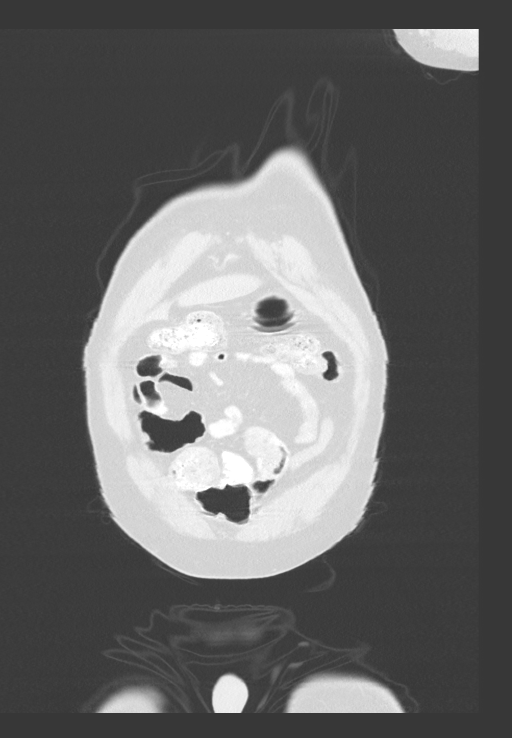
[im 66/165  lung]
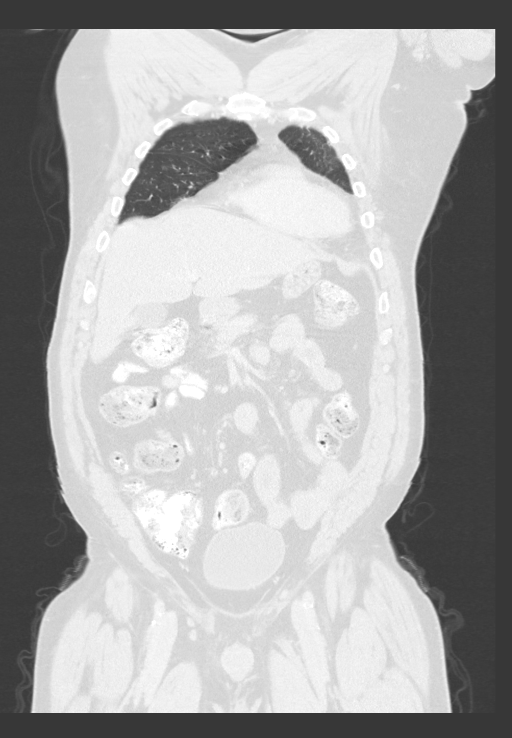
[im 99/165  lung]
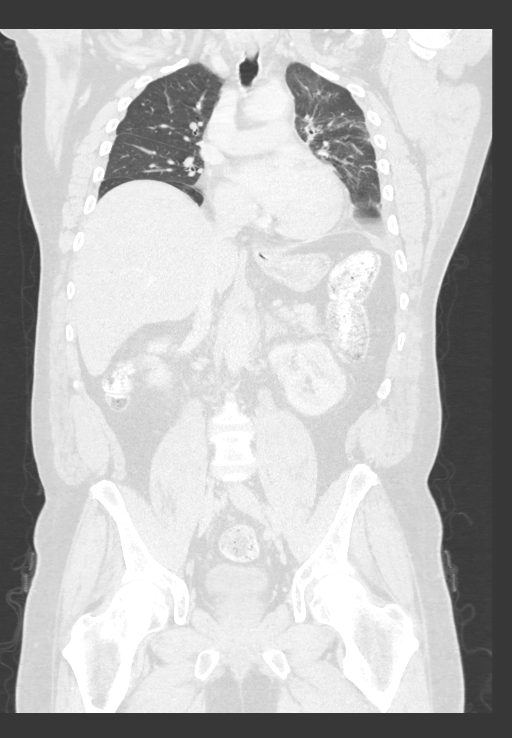

[13 of 36 positions shown; findings below may reference images not displayed]

FINDINGS: Despite efforts by the technologist and patient, motion artifact is
present on today's exam and could not be eliminated. This reduces
exam sensitivity and specificity.

CT CHEST FINDINGS

Cardiovascular: Coronary, aortic arch, and branch vessel
atherosclerotic vascular disease. Mild cardiomegaly.

Mediastinum/Nodes: Stable indistinctly marginated density in the AP
window with possibilities including fluid along the superior
pericardial recess or treatment related findings, without right
obliquely measurable adenopathy.

Small left supraclavicular node 0.7 cm in short axis on image [DATE],
previously 0.5 cm.

Lungs/Pleura: Trace left pleural effusion ground-glass density right
upper lobe nodule 1.4 by 1.0 cm on image 36/6, previously the same
by my measurements. A smaller ground-glass density nodule anteriorly
in the right upper lobe is blurred by motion artifact but about
cm in diameter on image 36/6, similar to the prior exam as well.

Mild bilateral airway thickening. Confluent bandlike and
ground-glass densities in the left upper lobe are likely therapy
related and with some localized exacerbated airway
thickening/peribronchovascular density which is similar to prior.
Small nodular component along the bandlike thickening in the left
upper lobe measures 0.9 by 0.6 cm on image 43/6, previously the
same.

Mild scarring anteriorly in the left lower lobe.

Musculoskeletal: Stable sclerotic 1.8 cm lesion in the T10 vertebral
body compatible with previously treated malignancy.

CT ABDOMEN PELVIS FINDINGS

Hepatobiliary: Stable 4 mm hypodense lesion in the dome of the right
hepatic lobe on image 36/2, no change from 03/31/2019, probably
benign/incidental. Otherwise unremarkable.

Pancreas: No well seen pancreatic tail mass. Pancreas appears
unremarkable.

Spleen: Unremarkable

Adrenals/Urinary Tract: Unremarkable

Stomach/Bowel: Redundant sigmoid colon.

Vascular/Lymphatic: Aortoiliac atherosclerotic vascular disease. No
pathologic adenopathy identified.

Reproductive: Unremarkable

Other: No supplemental non-categorized findings.

Musculoskeletal: Stable indistinct sclerosis posteriorly in the L5
vertebral body with a superior endplate lucent lesion or large
Schmorl's node, unchanged from prior. Degenerative facet arthropathy
at L4-5. Stable enchondroma in the right femoral neck.
IMPRESSION: 1. Stable appearance of the chest, abdomen, and pelvis, with
indistinctly marginated density in the AP window and a small nodular
component along the bandlike thickening in the left upper lobe.
Stable sclerotic lesion in the T10 vertebral body. Stable indistinct
sclerosis posteriorly in the L5 vertebral body with a superior
endplate Schmorl's node or large Schmorl's node. No new bony lesions
are identified.
2. Stable ground-glass density nodules in the right upper lobe,
postinflammatory versus low-grade adenocarcinoma.
3. Airway thickening is present, suggesting bronchitis or reactive
airways disease.
4. Trace left pleural effusion.
5. Mild cardiomegaly.
6. Coronary, aortic arch, and branch vessel atherosclerotic vascular
disease.
7. Stable 4 mm hypodense lesion in the dome of the right hepatic
lobe, no change from 03/31/2019, probably benign/incidental.

## 2020-04-27 IMAGING — CT CT CHEST W/ CM
2 of 5 series · 13 of 36 positions shown, 16 images · IV contrast (OMNIPAQUE)
Comparison: 11/15/2019

CLINICAL DATA: Restaging lung cancer. Left leg swelling for 2
weeks.

EXAM:
CT CHEST, ABDOMEN, AND PELVIS WITH CONTRAST
TECHNIQUE: Multidetector CT imaging of the chest, abdomen and pelvis was
performed following the standard protocol during bolus
administration of intravenous contrast.
CONTRAST:  75mL OMNIPAQUE IOHEXOL 300 MG/ML  SOLN

[Series 2: cap with · axial · 0.84mm/px · z∈[+1123,+1643]mm · 10 of 128 slices shown, 13 images]
[im 12/128  mediastinal]
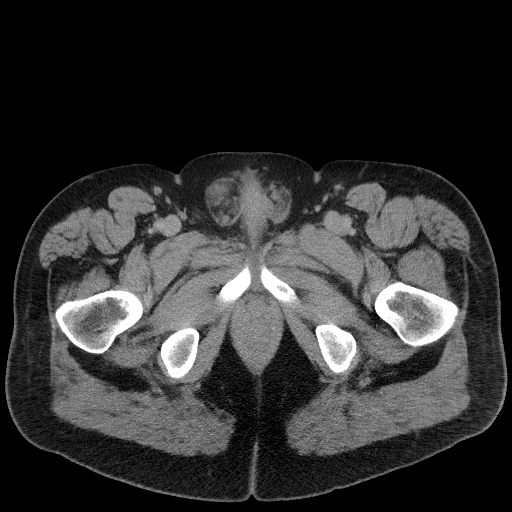
[im 12/128  lung]
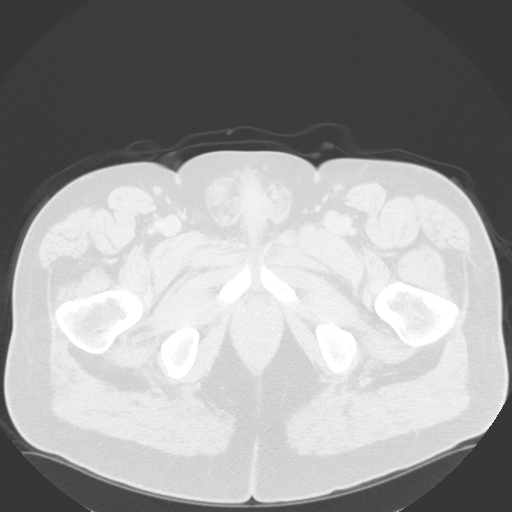
[im 24/128  lung]
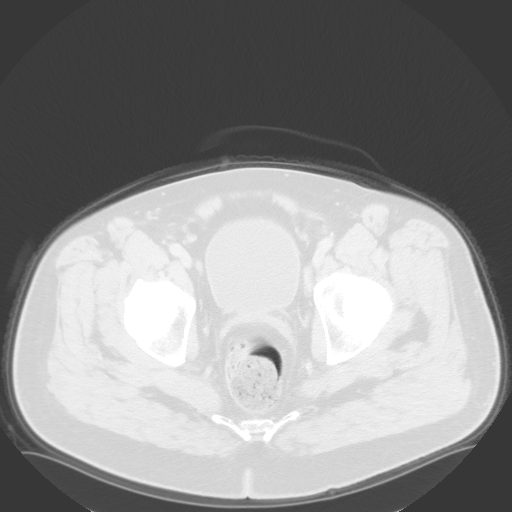
[im 35/128  lung]
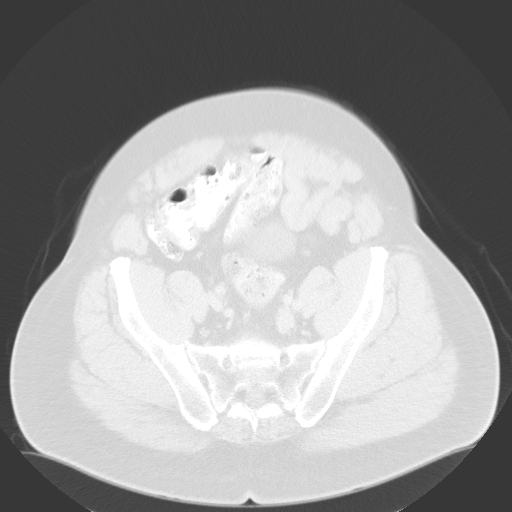
[im 47/128  lung]
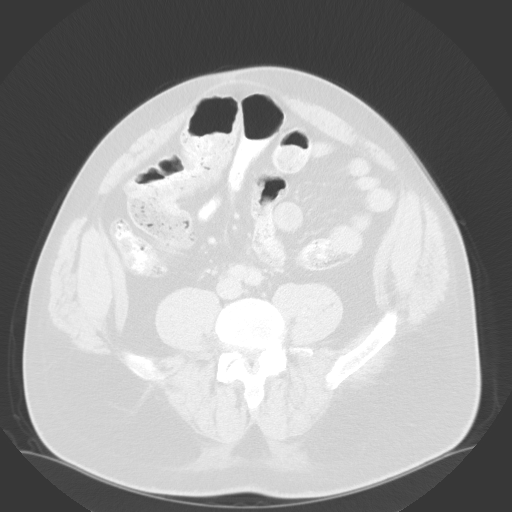
[im 58/128  mediastinal]
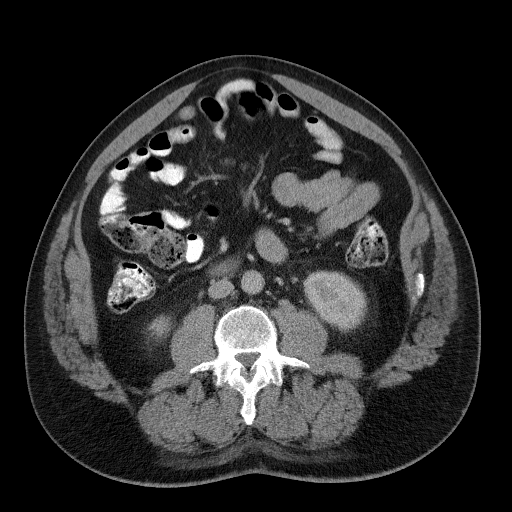
[im 58/128  lung]
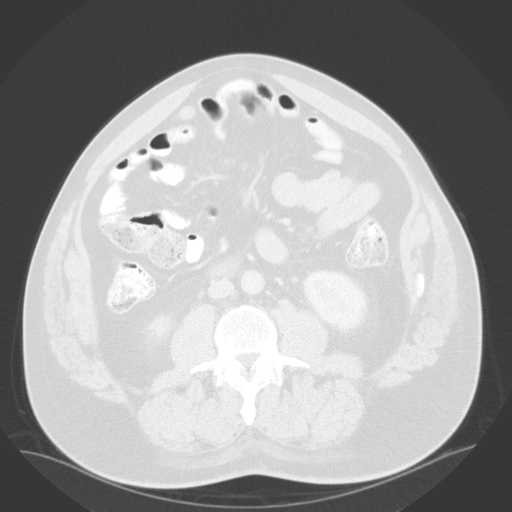
[im 70/128  lung]
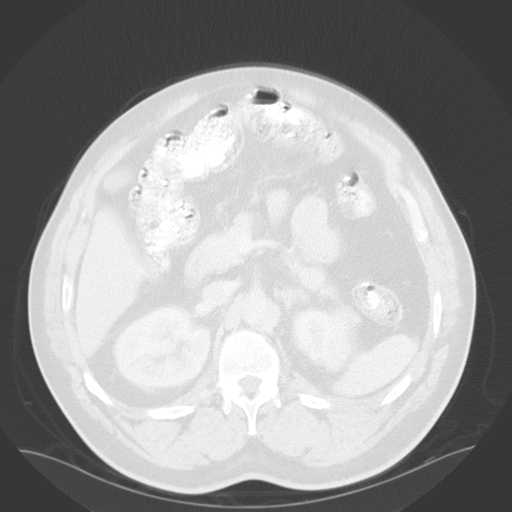
[im 81/128  lung]
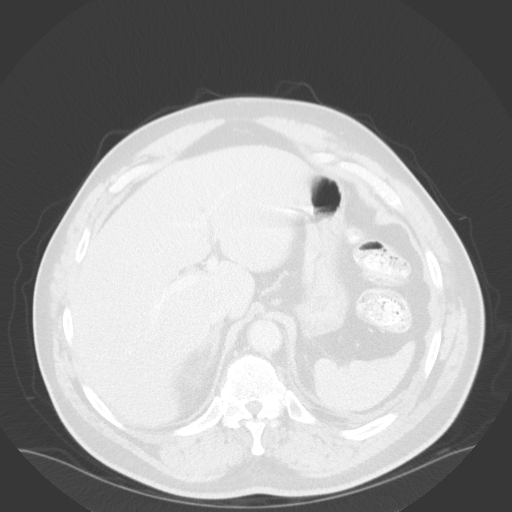
[im 93/128  lung]
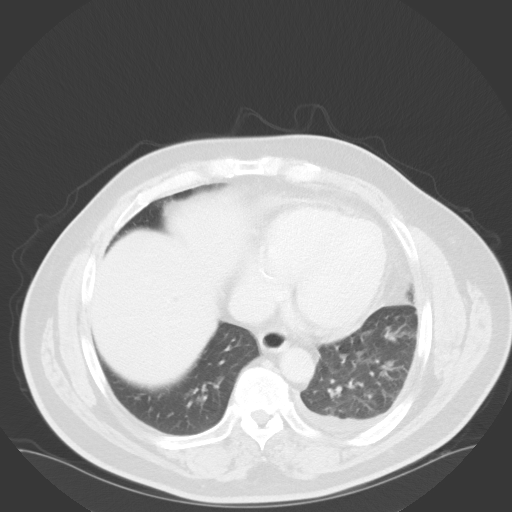
[im 104/128  mediastinal]
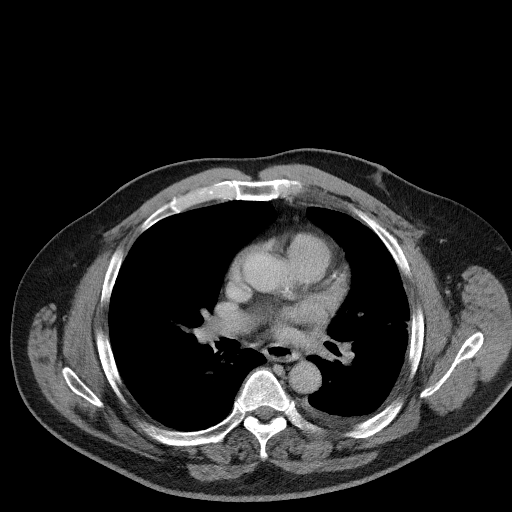
[im 104/128  lung]
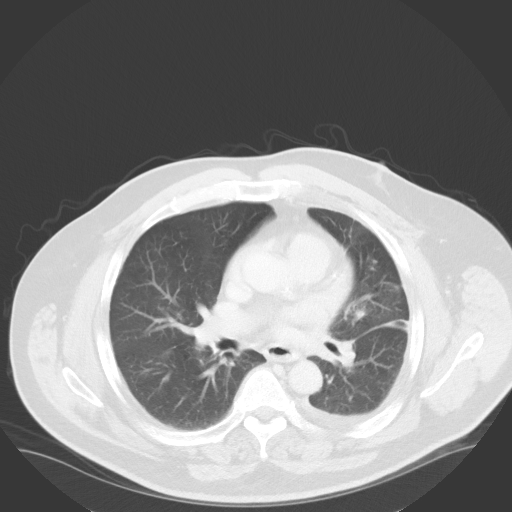
[im 116/128  lung]
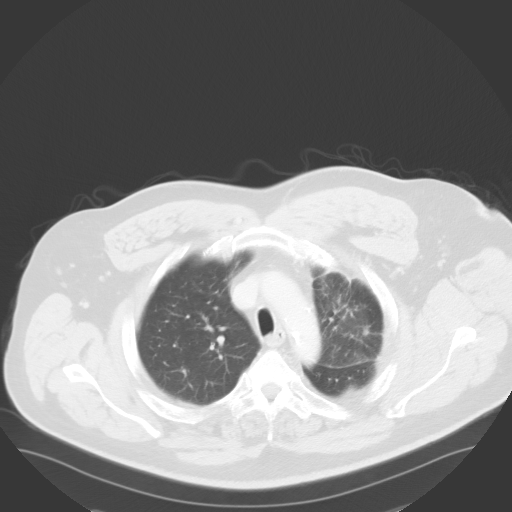

[Series 4: coronals · coronal · 0.94mm/px · 3 of 165 slices shown]
[im 33/165  lung]
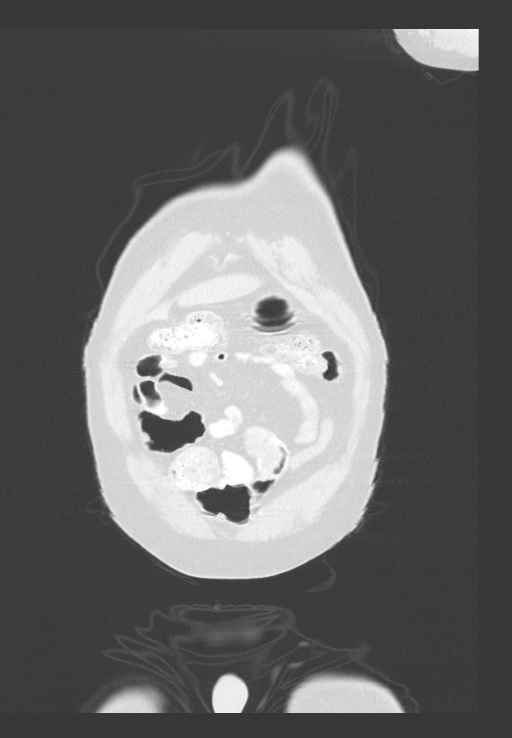
[im 66/165  lung]
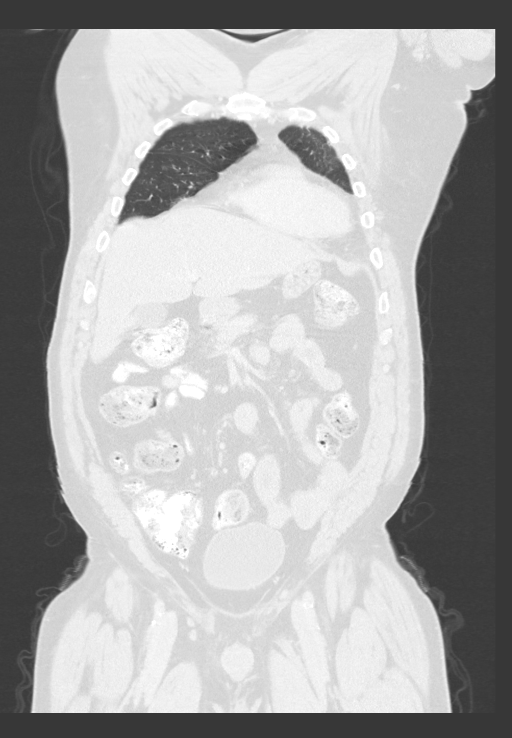
[im 99/165  lung]
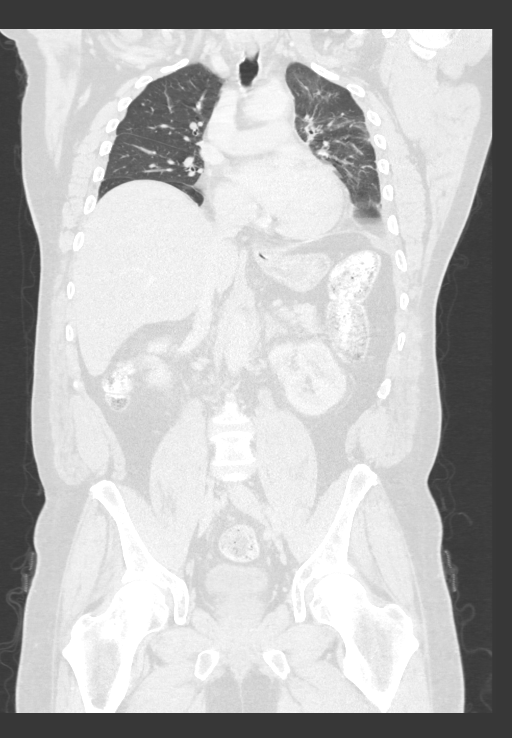

[13 of 36 positions shown; findings below may reference images not displayed]

FINDINGS: Despite efforts by the technologist and patient, motion artifact is
present on today's exam and could not be eliminated. This reduces
exam sensitivity and specificity.

CT CHEST FINDINGS

Cardiovascular: Coronary, aortic arch, and branch vessel
atherosclerotic vascular disease. Mild cardiomegaly.

Mediastinum/Nodes: Stable indistinctly marginated density in the AP
window with possibilities including fluid along the superior
pericardial recess or treatment related findings, without right
obliquely measurable adenopathy.

Small left supraclavicular node 0.7 cm in short axis on image [DATE],
previously 0.5 cm.

Lungs/Pleura: Trace left pleural effusion ground-glass density right
upper lobe nodule 1.4 by 1.0 cm on image 36/6, previously the same
by my measurements. A smaller ground-glass density nodule anteriorly
in the right upper lobe is blurred by motion artifact but about
cm in diameter on image 36/6, similar to the prior exam as well.

Mild bilateral airway thickening. Confluent bandlike and
ground-glass densities in the left upper lobe are likely therapy
related and with some localized exacerbated airway
thickening/peribronchovascular density which is similar to prior.
Small nodular component along the bandlike thickening in the left
upper lobe measures 0.9 by 0.6 cm on image 43/6, previously the
same.

Mild scarring anteriorly in the left lower lobe.

Musculoskeletal: Stable sclerotic 1.8 cm lesion in the T10 vertebral
body compatible with previously treated malignancy.

CT ABDOMEN PELVIS FINDINGS

Hepatobiliary: Stable 4 mm hypodense lesion in the dome of the right
hepatic lobe on image 36/2, no change from 03/31/2019, probably
benign/incidental. Otherwise unremarkable.

Pancreas: No well seen pancreatic tail mass. Pancreas appears
unremarkable.

Spleen: Unremarkable

Adrenals/Urinary Tract: Unremarkable

Stomach/Bowel: Redundant sigmoid colon.

Vascular/Lymphatic: Aortoiliac atherosclerotic vascular disease. No
pathologic adenopathy identified.

Reproductive: Unremarkable

Other: No supplemental non-categorized findings.

Musculoskeletal: Stable indistinct sclerosis posteriorly in the L5
vertebral body with a superior endplate lucent lesion or large
Schmorl's node, unchanged from prior. Degenerative facet arthropathy
at L4-5. Stable enchondroma in the right femoral neck.
IMPRESSION: 1. Stable appearance of the chest, abdomen, and pelvis, with
indistinctly marginated density in the AP window and a small nodular
component along the bandlike thickening in the left upper lobe.
Stable sclerotic lesion in the T10 vertebral body. Stable indistinct
sclerosis posteriorly in the L5 vertebral body with a superior
endplate Schmorl's node or large Schmorl's node. No new bony lesions
are identified.
2. Stable ground-glass density nodules in the right upper lobe,
postinflammatory versus low-grade adenocarcinoma.
3. Airway thickening is present, suggesting bronchitis or reactive
airways disease.
4. Trace left pleural effusion.
5. Mild cardiomegaly.
6. Coronary, aortic arch, and branch vessel atherosclerotic vascular
disease.
7. Stable 4 mm hypodense lesion in the dome of the right hepatic
lobe, no change from 03/31/2019, probably benign/incidental.

## 2020-05-03 ENCOUNTER — Ambulatory Visit: Payer: Medicaid Other | Admitting: Internal Medicine

## 2020-05-03 ENCOUNTER — Ambulatory Visit: Payer: Medicaid Other

## 2020-05-03 ENCOUNTER — Other Ambulatory Visit: Payer: Medicaid Other

## 2020-05-23 ENCOUNTER — Telehealth: Payer: Self-pay | Admitting: Medical Oncology

## 2020-05-23 NOTE — Telephone Encounter (Signed)
Willie Schmidt asking if insurance will pay for CT scan while pt on Hospice.  I told her they will not pay for scans or treatment , but pt can pay out of pocket. She thanked me for the information.

## 2020-05-24 ENCOUNTER — Ambulatory Visit: Payer: Medicaid Other | Admitting: Internal Medicine

## 2020-05-24 ENCOUNTER — Other Ambulatory Visit: Payer: Medicaid Other

## 2020-05-24 ENCOUNTER — Ambulatory Visit: Payer: Medicaid Other

## 2020-07-12 ENCOUNTER — Telehealth: Payer: Self-pay | Admitting: Medical Oncology

## 2020-07-12 NOTE — Telephone Encounter (Signed)
Called pt for update .

## 2020-11-06 ENCOUNTER — Telehealth: Payer: Self-pay | Admitting: Internal Medicine

## 2020-11-06 NOTE — Telephone Encounter (Signed)
Scheduled appt per 11/15 sch msg - pt daughter is aware of appt date and time

## 2020-11-09 ENCOUNTER — Inpatient Hospital Stay: Payer: Medicaid Other | Attending: Internal Medicine | Admitting: Internal Medicine

## 2020-11-09 ENCOUNTER — Other Ambulatory Visit: Payer: Self-pay

## 2020-11-09 ENCOUNTER — Encounter: Payer: Self-pay | Admitting: Internal Medicine

## 2020-11-09 VITALS — BP 133/91 | HR 110 | Temp 97.5°F | Resp 18 | Ht 69.0 in | Wt 242.8 lb

## 2020-11-09 DIAGNOSIS — C3490 Malignant neoplasm of unspecified part of unspecified bronchus or lung: Secondary | ICD-10-CM | POA: Diagnosis not present

## 2020-11-09 DIAGNOSIS — I10 Essential (primary) hypertension: Secondary | ICD-10-CM | POA: Insufficient documentation

## 2020-11-09 DIAGNOSIS — C3492 Malignant neoplasm of unspecified part of left bronchus or lung: Secondary | ICD-10-CM

## 2020-11-09 DIAGNOSIS — C3412 Malignant neoplasm of upper lobe, left bronchus or lung: Secondary | ICD-10-CM | POA: Diagnosis present

## 2020-11-09 NOTE — Progress Notes (Signed)
[place] [phone]   Fax:([phone]  OFFICE PROGRESS NOTE  [name], [address] [place] [address]  DIAGNOSIS: Stage IV (T2b, N3, M1 C) non-small cell lung cancer, adenocarcinoma presented with left upper lobe lung mass in addition to mediastinal and left supraclavicular lymphadenopathy as well as bilateral pulmonary nodules and suspicious bone lesion diagnosed in March 2020.  PRIOR THERAPY: Systemic chemotherapy with carboplatin for AUC of 5, Alimta 500 mg/M2 and Keytruda 200 mg IV every 3 weeks.  First dose March 11, 2019.  Status post 19 cycles.  Starting from cycle #5 the patient is on maintenance treatment with Alimta and Keytruda every 3 weeks.  CURRENT THERAPY: Hospice and palliative care.  INTERVAL HISTORY: Willie Schmidt 59 y.o. male returns to the clinic today for follow-up visit accompanied by his daughter.  The patient is feeling fine today with no concerning complaints.  He was on the palliative care on hospice for more than 6 months.  The patient and his daughter called requesting to be seen because he still alive and they want to evaluate his current condition.  He denied having any current chest pain but continues to have the right shoulder pain.  He denied having any shortness of breath, cough or hemoptysis.  He denied having any nausea, vomiting, diarrhea or constipation.  He denied having any headache or visual changes.   MEDICAL HISTORY: Past Medical History:  Diagnosis Date  . Bronchitis, chronic ([place])   . Hypertension   . metastatic lung ca dx'd 02/2019   lyphadenopathy, bil pulmonary noduels; suspected bone lesions    ALLERGIES:  has No Known Allergies.  MEDICATIONS:  Current Outpatient Medications  Medication Sig Dispense Refill  . albuterol (PROVENTIL HFA;VENTOLIN HFA) 108 (90 BASE) MCG/ACT inhaler Inhale 2 puffs into the lungs every 6 (six) hours as needed for wheezing. 1 Inhaler 2  . amLODipine  (NORVASC) 5 MG tablet TK 1 T PO Q NIGHT    . aspirin 81 MG tablet Take 81 mg by mouth daily.    . cyclobenzaprine (FLEXERIL) 10 MG tablet TAKE 1 TABLET BY MOUTH THREE TIMES DAILY AS NEEDED FOR MUSCLE SPASMS 30 tablet 0  . DULoxetine (CYMBALTA) 30 MG capsule 1 ONCE DAILY FOR 1 WEEK THEN INCREASE TO 1 TWICE DAILY 60 capsule 3  . folic acid (FOLVITE) 1 MG tablet TAKE 1 TABLET(1 MG) BY MOUTH DAILY 30 tablet 4  . furosemide (LASIX) 20 MG tablet Take 1 tablet (20 mg total) by mouth daily. 7 tablet 0  . gabapentin (NEURONTIN) 300 MG capsule Take 1 capsule (300 mg total) by mouth 3 (three) times daily. 90 capsule 3  . indomethacin (INDOCIN) 50 MG capsule Take 1 capsule (50 mg total) by mouth 3 (three) times daily with meals. 12 capsule 0  . lisinopril-hydrochlorothiazide (ZESTORETIC) 20-25 MG tablet Take 1 tablet by mouth daily.    . meloxicam (MOBIC) 15 MG tablet TK 1 T PO QD WF    . oxyCODONE-acetaminophen (PERCOCET/ROXICET) 5-325 MG tablet Take 1 tablet by mouth every 6 (six) hours as needed for severe pain. 30 tablet 0  . potassium chloride SA (KLOR-CON) 20 MEQ tablet 1 Tablet once daily while taking Lasix 30 tablet 1  . prochlorperazine (COMPAZINE) 10 MG tablet TAKE 1 TABLET(10 MG) BY MOUTH EVERY 6 HOURS AS NEEDED FOR NAUSEA OR VOMITING 30 tablet 0   No current facility-administered medications for this visit.    SURGICAL HISTORY:  No past surgical history on file.  REVIEW OF SYSTEMS:  Constitutional: positive for fatigue Eyes: negative Ears, nose, mouth, throat, and face: negative Respiratory: negative Cardiovascular: negative Gastrointestinal: negative Genitourinary:negative Integument/breast: negative Hematologic/lymphatic: negative Musculoskeletal:positive for arthralgias Neurological: negative Behavioral/Psych: negative Endocrine: negative Allergic/Immunologic: negative   PHYSICAL EXAMINATION: General appearance: alert, cooperative, fatigued and no distress Head: Normocephalic,  without obvious abnormality, atraumatic Neck: no adenopathy, no JVD, supple, symmetrical, trachea midline and thyroid not enlarged, symmetric, no tenderness/mass/nodules Lymph nodes: Cervical, supraclavicular, and axillary nodes normal. Resp: clear to auscultation bilaterally Back: symmetric, no curvature. ROM normal. No CVA tenderness. Cardio: regular rate and rhythm, S1, S2 normal, no murmur, click, rub or gallop GI: soft, non-tender; bowel sounds normal; no masses,  no organomegaly Extremities: edema 1+ edema Neurologic: Alert and oriented X 3, normal strength and tone. Normal symmetric reflexes. Normal coordination and gait  ECOG PERFORMANCE STATUS: 1 - Symptomatic but completely ambulatory  Blood pressure (!) 133/91, pulse (!) 110, temperature (!) 97.5 F (36.4 C), temperature source Tympanic, resp. rate 18, height 5\' 9"  (1.753 m), weight 242 lb 12.8 oz (110.1 kg), SpO2 98 %.  LABORATORY DATA: Lab Results  Component Value Date   WBC 8.7 03/22/2020   HGB 11.9 (L) 03/22/2020   HCT 37.1 (L) 03/22/2020   MCV 97.4 03/22/2020   PLT 382 03/22/2020      Chemistry      Component Value Date/Time   NA 133 (L) 03/22/2020 1006   K 3.8 03/22/2020 1006   CL 97 (L) 03/22/2020 1006   CO2 24 03/22/2020 1006   BUN 13 03/22/2020 1006   CREATININE 0.87 03/22/2020 1006      Component Value Date/Time   CALCIUM 9.4 03/22/2020 1006   ALKPHOS 143 (H) 03/22/2020 1006   AST 68 (H) 03/22/2020 1006   ALT 30 03/22/2020 1006   BILITOT 0.5 03/22/2020 1006       RADIOGRAPHIC STUDIES: No results found.  ASSESSMENT AND PLAN: This is a very pleasant 59 years old African-American male with likely stage IV non-small cell lung cancer, adenocarcinoma presented with large left upper lobe lung mass in addition to mediastinal and left supraclavicular lymphadenopathy as well as suspicious bone metastasis and bilateral pulmonary nodules diagnosed in March 2020. The patient is currently undergoing systemic  chemotherapy with carboplatin, Alimta and Keytruda status post 18 cycles.  Starting from cycle #5 he is receiving maintenance treatment with Alimta and Keytruda. The patient has been tolerating his treatment well with no concerning adverse effects except for mild fatigue. He had repeat CT scan of the chest, abdomen and pelvis performed recently.  I personally and independently reviewed the scan images and discussed the results with the patient and his daughters.  His scan showed mild increase in some of the lymph nodes in the abdomen as well as the neck area. The patient has been on hospice for more than 6 months and he is still alive in his family are wondering about reevaluation of his condition and repeating imaging studies to see where the stand with his disease. They understand that he will be off hospice to order any imaging studies and they are accepting this.  The daughters are still not interested in considering future treatment but insisting on having imaging studies done. I will order CT scan of the chest, abdomen pelvis to be performed in the next 1-2 weeks and the patient will come back for follow-up visit at that time. He was advised to call immediately if he has  any concerning symptoms in the interval.  The patient voices understanding of current disease status and treatment options and is in agreement with the current care plan. All questions were answered. The patient knows to call the clinic with any problems, questions or concerns. We can certainly see the patient much sooner if necessary. The total time spent in the appointment was 45 minutes.  Disclaimer: This note was dictated with voice recognition software. Similar sounding words can inadvertently be transcribed and may not be corrected upon review.

## 2020-11-13 ENCOUNTER — Ambulatory Visit: Payer: Medicaid Other | Admitting: Internal Medicine

## 2020-11-17 ENCOUNTER — Telehealth: Payer: Self-pay | Admitting: Internal Medicine

## 2020-11-17 NOTE — Telephone Encounter (Signed)
Scheduled per los. Called and left msg. Mailed printout  °

## 2020-11-24 ENCOUNTER — Inpatient Hospital Stay: Payer: Medicaid Other | Attending: Internal Medicine

## 2020-11-28 ENCOUNTER — Inpatient Hospital Stay: Payer: Medicaid Other | Admitting: Internal Medicine

## 2020-11-28 ENCOUNTER — Telehealth: Payer: Self-pay | Admitting: Medical Oncology

## 2020-11-28 NOTE — Telephone Encounter (Signed)
Pt still on hospice so he did not get scan. He wants to stay on Hospice and will call if he decides to go off Hospice.

## 2021-04-11 ENCOUNTER — Telehealth: Payer: Self-pay | Admitting: Internal Medicine

## 2021-04-11 ENCOUNTER — Telehealth: Payer: Self-pay | Admitting: Medical Oncology

## 2021-04-11 NOTE — Telephone Encounter (Signed)
  Pt changed his mind and wants scan and labs.. Orders in and schedule message sent.

## 2021-04-11 NOTE — Telephone Encounter (Signed)
CT scan-[facility] notified to contact Hospice to about pt wanting  scan .

## 2021-04-11 NOTE — Telephone Encounter (Signed)
Scheduled appts per 4/20 sch msg. Called pt's daughter per msg. No answer. Left msg with appts dates and times.

## 2021-04-17 ENCOUNTER — Ambulatory Visit (HOSPITAL_COMMUNITY): Admission: RE | Admit: 2021-04-17 | Payer: Medicaid Other | Source: Ambulatory Visit

## 2021-04-17 ENCOUNTER — Telehealth: Payer: Self-pay | Admitting: Internal Medicine

## 2021-04-17 ENCOUNTER — Inpatient Hospital Stay: Payer: Medicaid Other | Attending: Family Medicine

## 2021-04-17 NOTE — Progress Notes (Signed)
Notification received from Radiology that pt "no showed" his CT scan appt today.

## 2021-04-17 NOTE — Telephone Encounter (Signed)
R/s 5/2 appt per provider request. Called and spoke with patient. Confirmed new date and time

## 2021-04-23 ENCOUNTER — Inpatient Hospital Stay: Payer: Medicaid Other | Admitting: Internal Medicine

## 2021-04-24 ENCOUNTER — Inpatient Hospital Stay: Payer: Medicaid Other | Attending: Internal Medicine | Admitting: Internal Medicine

## 2022-03-19 ENCOUNTER — Telehealth: Payer: Self-pay | Admitting: *Deleted

## 2022-03-19 NOTE — Telephone Encounter (Signed)
Faxed signed orders. Fax confirmation received ?

## 2022-07-15 ENCOUNTER — Encounter: Payer: Self-pay | Admitting: Medical Oncology

## 2022-07-15 ENCOUNTER — Telehealth: Payer: Self-pay | Admitting: Medical Oncology

## 2022-07-15 NOTE — Telephone Encounter (Signed)
Dtr requests letter with pts date of diagnosis and date of referral to Hospice. Emailed to [facility].

## 2023-04-06 ENCOUNTER — Other Ambulatory Visit: Payer: Self-pay

## 2023-04-06 ENCOUNTER — Emergency Department (HOSPITAL_COMMUNITY): Payer: Medicaid Other

## 2023-04-06 ENCOUNTER — Emergency Department (HOSPITAL_COMMUNITY)
Admission: EM | Admit: 2023-04-06 | Discharge: 2023-04-06 | Disposition: A | Discharge status: Home / Self Care | Payer: Medicaid Other | Attending: Emergency Medicine | Admitting: Emergency Medicine

## 2023-04-06 DIAGNOSIS — Z7982 Long term (current) use of aspirin: Secondary | ICD-10-CM | POA: Diagnosis not present

## 2023-04-06 DIAGNOSIS — R55 Syncope and collapse: Secondary | ICD-10-CM | POA: Insufficient documentation

## 2023-04-06 DIAGNOSIS — Z85118 Personal history of other malignant neoplasm of bronchus and lung: Secondary | ICD-10-CM | POA: Diagnosis not present

## 2023-04-06 DIAGNOSIS — Z20822 Contact with and (suspected) exposure to covid-19: Secondary | ICD-10-CM | POA: Insufficient documentation

## 2023-04-06 DIAGNOSIS — E871 Hypo-osmolality and hyponatremia: Secondary | ICD-10-CM | POA: Diagnosis not present

## 2023-04-06 DIAGNOSIS — R0602 Shortness of breath: Secondary | ICD-10-CM | POA: Diagnosis not present

## 2023-04-06 DIAGNOSIS — E876 Hypokalemia: Secondary | ICD-10-CM | POA: Diagnosis not present

## 2023-04-06 DIAGNOSIS — R410 Disorientation, unspecified: Secondary | ICD-10-CM | POA: Diagnosis present

## 2023-04-06 LAB — CBC WITH DIFFERENTIAL/PLATELET
Abs Immature Granulocytes: 0.05 10*3/uL (ref 0.00–0.07)
Basophils Absolute: 0 10*3/uL (ref 0.0–0.1)
Basophils Relative: 0 %
Eosinophils Absolute: 0.2 10*3/uL (ref 0.0–0.5)
Eosinophils Relative: 2 %
HCT: 34.9 % — ABNORMAL LOW (ref 39.0–52.0)
Hemoglobin: 11.4 g/dL — ABNORMAL LOW (ref 13.0–17.0)
Immature Granulocytes: 0 %
Lymphocytes Relative: 23 %
Lymphs Abs: 2.7 10*3/uL (ref 0.7–4.0)
MCH: 29.3 pg (ref 26.0–34.0)
MCHC: 32.7 g/dL (ref 30.0–36.0)
MCV: 89.7 fL (ref 80.0–100.0)
Monocytes Absolute: 1.3 10*3/uL — ABNORMAL HIGH (ref 0.1–1.0)
Monocytes Relative: 12 %
Neutro Abs: 7.2 10*3/uL (ref 1.7–7.7)
Neutrophils Relative %: 63 %
Platelets: 349 10*3/uL (ref 150–400)
RBC: 3.89 MIL/uL — ABNORMAL LOW (ref 4.22–5.81)
RDW: 12.7 % (ref 11.5–15.5)
WBC: 11.4 10*3/uL — ABNORMAL HIGH (ref 4.0–10.5)
nRBC: 0 % (ref 0.0–0.2)

## 2023-04-06 LAB — RESP PANEL BY RT-PCR (RSV, FLU A&B, COVID)  RVPGX2
Influenza A by PCR: NEGATIVE
Influenza B by PCR: NEGATIVE
Resp Syncytial Virus by PCR: NEGATIVE
SARS Coronavirus 2 by RT PCR: NEGATIVE

## 2023-04-06 LAB — COMPREHENSIVE METABOLIC PANEL
ALT: 25 U/L (ref 0–44)
AST: 24 U/L (ref 15–41)
Albumin: 4.2 g/dL (ref 3.5–5.0)
Alkaline Phosphatase: 98 U/L (ref 38–126)
Anion gap: 15 (ref 5–15)
BUN: 10 mg/dL (ref 8–23)
CO2: 28 mmol/L (ref 22–32)
Calcium: 9.3 mg/dL (ref 8.9–10.3)
Chloride: 80 mmol/L — ABNORMAL LOW (ref 98–111)
Creatinine, Ser: 0.91 mg/dL (ref 0.61–1.24)
GFR, Estimated: >60 mL/min (ref >60)
Glucose, Bld: 108 mg/dL — ABNORMAL HIGH (ref 70–99)
Potassium: 2.8 mmol/L — ABNORMAL LOW (ref 3.5–5.1)
Sodium: 123 mmol/L — ABNORMAL LOW (ref 135–145)
Total Bilirubin: 0.5 mg/dL (ref 0.3–1.2)
Total Protein: 8.2 g/dL — ABNORMAL HIGH (ref 6.5–8.1)

## 2023-04-06 LAB — URINALYSIS, ROUTINE W REFLEX MICROSCOPIC
Bilirubin Urine: NEGATIVE
Glucose, UA: NEGATIVE mg/dL
Hgb urine dipstick: NEGATIVE
Ketones, ur: NEGATIVE mg/dL
Leukocytes,Ua: NEGATIVE
Nitrite: NEGATIVE
Protein, ur: NEGATIVE mg/dL
Specific Gravity, Urine: 1.006 (ref 1.005–1.030)
pH: 7 (ref 5.0–8.0)

## 2023-04-06 LAB — PROTIME-INR
INR: 1 (ref 0.8–1.2)
Prothrombin Time: 13.2 seconds (ref 11.4–15.2)

## 2023-04-06 LAB — BRAIN NATRIURETIC PEPTIDE: B Natriuretic Peptide: 4.2 pg/mL (ref 0.0–100.0)

## 2023-04-06 LAB — AMMONIA: Ammonia: 48 umol/L — ABNORMAL HIGH (ref 9–35)

## 2023-04-06 LAB — LACTIC ACID, PLASMA: Lactic Acid, Venous: 1.4 mmol/L (ref 0.5–1.9)

## 2023-04-06 MED ORDER — SODIUM CHLORIDE 0.9 % IV BOLUS
1000.0000 mL | Freq: Once | INTRAVENOUS | Status: AC
  Administered 2023-04-06: 1000 mL via INTRAVENOUS

## 2023-04-06 MED ORDER — POTASSIUM CHLORIDE ER 10 MEQ PO TBCR: 10.0000 meq | EXTENDED_RELEASE_TABLET | Freq: Every day | ORAL | 0 refills | Status: DC

## 2023-04-06 MED ORDER — POTASSIUM CHLORIDE CRYS ER 20 MEQ PO TBCR
40.0000 meq | EXTENDED_RELEASE_TABLET | Freq: Once | ORAL | Status: AC
  Administered 2023-04-06: 40 meq via ORAL
  Filled 2023-04-06: qty 2

## 2023-04-06 MED ORDER — FENTANYL CITRATE PF 50 MCG/ML IJ SOSY: 50.0000 ug | PREFILLED_SYRINGE | Freq: Once | INTRAMUSCULAR | Status: DC

## 2023-04-06 MED ORDER — IOHEXOL 350 MG/ML SOLN
75.0000 mL | Freq: Once | INTRAVENOUS | Status: AC | PRN
  Administered 2023-04-06: 75 mL via INTRAVENOUS

## 2023-04-06 NOTE — ED Triage Notes (Addendum)
Pt BIBGEMS from home got up and went to church, when he got dizzy with sob and took two puffs of his inhaler. At church was confused per bystanders. Unable to answer what year it is but all other orientation questions correct. Nausea upon awakening. 2.5 mg Albuterol given with ems  Hx lung cancer  139/87 96% RA 96 hr CBG 127

## 2023-04-06 NOTE — Discharge Instructions (Addendum)
Please follow-up with your primary care doctor's office if you are wishing to continue with medical management.  The potassium levels and salt levels were low in the blood today, and I prescribed some potassium supplements that he can take, as well as adding some salt to the diet.

## 2023-04-06 NOTE — ED Notes (Signed)
Patient transported to CT 

## 2023-04-06 NOTE — ED Provider Notes (Signed)
[place] EMERGENCY DEPARTMENT AT [facility] Provider Note   CSN: [id number] Arrival date & time: 04/06/23  1121     History  Chief Complaint  Patient presents with   Altered Mental Status    Willie Schmidt is a 62 y.o. male with a history of stage IV metastatic lung cancer, no longer on chemo treatment, presenting to the ED by EMS concern for altered mental status and confusion.  The patient was at church today reportedly became dizzy and short of breath, had an episode where he appeared to be unresponsive and possibly some shaking activity or clonic activity.  He was nauseous when he awoke.  EMS gave him a small dose of albuterol.  The patient's daughter is now present at the bedside reports that she is his power of attorney, and that he lives with her, she helps take care of him.  She states that they made the decision to go on to hospice care in 2020 due to diagnosis of stage IV metastatic cancer, that was being treated with palliative chemotherapy at the time.  However, the patient has been doing well for the past 3 to 4 years, has a robust regular appetite, and overall clinically was doing well despite his cancer diagnosis.  He is no longer on any medications aside from comfort medications and his blood pressure medicine.  He is back at his baseline mental status now.  The patient was offers no acute complaints to me.  HPI     Home Medications Prior to Admission medications   Medication Sig Start Date End Date Taking? Authorizing Provider  potassium chloride (KLOR-CON) 10 MEQ tablet Take 1 tablet (10 mEq total) by mouth daily for 15 doses. 04/06/23 04/21/23 Yes Velisa Regnier, Kermit Balo, MD  acetaminophen (TYLENOL) 325 MG tablet Take 325 mg by mouth every 6 (six) hours as needed. 06/21/22   [provider]  albuterol (PROVENTIL HFA;VENTOLIN HFA) 108 (90 BASE) MCG/ACT inhaler Inhale 2 puffs into the lungs every 6 (six) hours as needed for wheezing. 02/10/13   Santiago Glad,  PA-C  amLODipine (NORVASC) 5 MG tablet TK 1 T PO Q NIGHT 06/25/19   [provider]  aspirin 81 MG tablet Take 81 mg by mouth daily.    [provider]  cyclobenzaprine (FLEXERIL) 10 MG tablet TAKE 1 TABLET BY MOUTH THREE TIMES DAILY AS NEEDED FOR MUSCLE SPASMS 09/30/19   Si Gaul, MD  DULoxetine (CYMBALTA) 30 MG capsule 1 ONCE DAILY FOR 1 WEEK THEN INCREASE TO 1 TWICE DAILY 10/25/19   Si Gaul, MD  folic acid (FOLVITE) 1 MG tablet TAKE 1 TABLET(1 MG) BY MOUTH DAILY 12/27/19   Si Gaul, MD  furosemide (LASIX) 20 MG tablet Take 1 tablet (20 mg total) by mouth daily. 01/19/20   Si Gaul, MD  gabapentin (NEURONTIN) 300 MG capsule Take 1 capsule (300 mg total) by mouth 3 (three) times daily. 02/09/20   Si Gaul, MD  indomethacin (INDOCIN) 50 MG capsule Take 1 capsule (50 mg total) by mouth 3 (three) times daily with meals. 10/07/19   Heilingoetter, Cassandra L, PA-C  lisinopril-hydrochlorothiazide (ZESTORETIC) 20-25 MG tablet Take 1 tablet by mouth daily. 07/23/19   [provider]  meloxicam (MOBIC) 15 MG tablet TK 1 T PO QD WF 06/01/19   [provider]  methadone (DOLOPHINE) 10 MG tablet Take 10 mg by mouth 3 (three) times daily. 06/24/22   [provider]  Oxycodone HCl 10 MG TABS Take 10 mg by  mouth every 6 (six) hours as needed. 06/21/22   [provider]  oxyCODONE-acetaminophen (PERCOCET/ROXICET) 5-325 MG tablet Take 1 tablet by mouth every 6 (six) hours as needed for severe pain. 03/27/20   Si Gaul, MD  Potassium Chloride ER 20 MEQ TBCR Take 1 tablet by mouth daily. 06/19/22   [provider]  potassium chloride SA (KLOR-CON) 20 MEQ tablet 1 Tablet once daily while taking Lasix 10/20/19   Tanner, Zenaida Niece E., PA-C  prochlorperazine (COMPAZINE) 10 MG tablet TAKE 1 TABLET(10 MG) BY MOUTH EVERY 6 HOURS AS NEEDED FOR NAUSEA OR VOMITING 09/30/19   Si Gaul, MD      Allergies    Patient has no known  allergies.    Review of Systems   Review of Systems  Physical Exam Updated Vital Signs BP 133/76   Pulse (!) 104   Temp 97.6 F (36.4 C) (Oral)   Resp 20   Ht 5\' 9"  (1.753 m)   Wt 108 kg   SpO2 98%   BMI 35.15 kg/m  Physical Exam Constitutional:      General: He is not in acute distress. HENT:     Head: Normocephalic and atraumatic.  Eyes:     Conjunctiva/sclera: Conjunctivae normal.     Pupils: Pupils are equal, round, and reactive to light.  Cardiovascular:     Rate and Rhythm: Normal rate and regular rhythm.  Pulmonary:     Effort: Pulmonary effort is normal. No respiratory distress.  Abdominal:     General: There is no distension.     Tenderness: There is no abdominal tenderness.  Skin:    General: Skin is warm and dry.  Neurological:     General: No focal deficit present.     Mental Status: He is alert and oriented to person, place, and time. Mental status is at baseline.  Psychiatric:        Mood and Affect: Mood normal.        Behavior: Behavior normal.     ED Results / Procedures / Treatments   Labs (all labs ordered are listed, but only abnormal results are displayed) Labs Reviewed  COMPREHENSIVE METABOLIC PANEL - Abnormal; Notable for the following components:      Result Value   Sodium 123 (*)    Potassium 2.8 (*)    Chloride 80 (*)    Glucose, Bld 108 (*)    Total Protein 8.2 (*)    All other components within normal limits  CBC WITH DIFFERENTIAL/PLATELET - Abnormal; Notable for the following components:   WBC 11.4 (*)    RBC 3.89 (*)    Hemoglobin 11.4 (*)    HCT 34.9 (*)    Monocytes Absolute 1.3 (*)    All other components within normal limits  URINALYSIS, ROUTINE W REFLEX MICROSCOPIC - Abnormal; Notable for the following components:   Color, Urine STRAW (*)    All other components within normal limits  AMMONIA - Abnormal; Notable for the following components:   Ammonia 48 (*)    All other components within normal limits  RESP PANEL  BY RT-PCR (RSV, FLU A&B, COVID)  RVPGX2  LACTIC ACID, PLASMA  PROTIME-INR  BRAIN NATRIURETIC PEPTIDE    EKG EKG Interpretation  Date/Time:  Sunday April 06 2023 11:30:15 EDT Ventricular Rate:  93 PR Interval:  204 QRS Duration: 111 QT Interval:  400 QTC Calculation: 498 R Axis:   56 Text Interpretation: Sinus tachycardia Atrial premature complexes Borderline low voltage, extremity leads Abnormal  R-wave progression, early transition Borderline prolonged QT interval Confirmed by Alvester Chou [phone]) on 04/06/2023 12:35:33 PM  Radiology CT Head W or Wo Contrast  Result Date: 04/06/2023 CLINICAL DATA:  Head/neck cancer, monitor concern for potential intracranial malignancy, hx of lung cancer 2020 EXAM: CT HEAD WITHOUT AND WITH CONTRAST TECHNIQUE: Contiguous axial images were obtained from the base of the skull through the vertex without and with intravenous contrast. RADIATION DOSE REDUCTION: This exam was performed according to the departmental dose-optimization program which includes automated exposure control, adjustment of the mA and/or kV according to patient size and/or use of iterative reconstruction technique. CONTRAST:  75mL OMNIPAQUE IOHEXOL 350 MG/ML SOLN COMPARISON:  None Available. FINDINGS: Brain: No evidence of acute infarction, hemorrhage, hydrocephalus, extra-axial collection or mass lesion/mass effect. No obvious pathologic enhancement or focal edema/mass effect. Mild patchy white matter hypodensities are nonspecific but compatible with chronic microvascular ischemic disease. Vascular: No hyperdense vessel or unexpected calcification. Visible vessels are patent. Skull: No acute fracture. Sinuses/Orbits: Right maxillary sinus air-fluid level. Other: No mastoid effusions IMPRESSION: No evidence of acute intracranial abnormality or obvious evidence of metastatic disease. Please note that an MRI with contrast could provide more sensitive evaluation for metastatic disease if the  patient is able. Electronically Signed   By: Feliberto Harts M.D.   On: 04/06/2023 14:57   DG Chest Port 1 View  Result Date: 04/06/2023 CLINICAL DATA:  Shortness of breath. EXAM: PORTABLE CHEST 1 VIEW COMPARISON:  Chest x-ray 09/21/2019. FINDINGS: Suspected small left pleural effusion. Streaky overlying left basilar opacities. Cardiomediastinal silhouette is unchanged. Right lung is clear. No visible pneumothorax. No acute osseous abnormality. IMPRESSION: Suspected small left pleural effusion. Streaky overlying left basilar opacities could represent atelectasis, aspiration, and/or pneumonia. Electronically Signed   By: Feliberto Harts M.D.   On: 04/06/2023 12:48    Procedures Procedures    Medications Ordered in ED Medications  sodium chloride 0.9 % bolus 1,000 mL (0 mLs Intravenous Stopped 04/06/23 1342)  potassium chloride SA (KLOR-CON M) CR tablet 40 mEq (40 mEq Oral Given 04/06/23 1245)  iohexol (OMNIPAQUE) 350 MG/ML injection 75 mL (75 mLs Intravenous Contrast Given 04/06/23 1415)    ED Course/ Medical Decision Making/ A&P                             Medical Decision Making Amount and/or Complexity of Data Reviewed Labs: ordered. Radiology: ordered.  Risk Prescription drug management.   This patient presents to the Emergency Department with complaint of altered mental status.  This involves an extensive number of treatment options, and is a complaint that carries with it a high risk of complications and morbidity.  The differential diagnosis includes hypoglycemia vs metabolic encephalopathy vs infection (including cystitis) vs ICH vs stroke vs polypharmacy vs other  Intracranial metastases or mass is also possibility.  In speaking to the patient's daughter at the bedside, she would prefer a diagnostic reassessment including neuroimaging at this time, to help evaluate patient's current cancer burden, and to help prognosticate about the future.  Otherwise they want to remain  minimally invasive and nonaggressive treatment with hospital treatment.  I ordered, reviewed, and interpreted labs, including hyponatremia, hypokalemia I ordered medication IV saline fluids for hyponatremia I ordered imaging studies which included CT head, dg chest  I independently visualized and interpreted imaging which showed no emergent findings and the monitor tracing which showed NSR Additional history was obtained from patient's daughter and PoA Previous  records obtained and reviewed showing oncology notes I personally reviewed the patients ECG which showed sinus rhythm with no acute ischemic findings  After the interventions stated above, I reevaluated the patient and found patient remained well appearing an asymptomatic, eating and drinking and watching television in the room.  I spoke to his daughter regarding his workup.  While we have not definitively ruled out all possibility of serious disease, including brain malignancy (a CT not being as sensitive as an MRI scan), there is no large new intracranial mass to suggest rapidly worsening prognosis.  At this point if they are wanting continued medical care -- and she is not certain they do -- they can reestablish connection with their oncologist or PCP and have repeat testing, cancer screening, and labs checked.  However, Mr Ellender has also been doing extraordinarily well on his own with minimal medications for nearly 3 years now since he was given a terminal prognosis, and they may wish to continue this course for now.  I suspect his electrolyte derangements are chronic, not acute, and not the cause of his episode today.  He can add more salt into his diet and we can replete K for now.  He will be going home with his daughter.          Final Clinical Impression(s) / ED Diagnoses Final diagnoses:  Hyponatremia  Near syncope  Hypokalemia    Rx / DC Orders ED Discharge Orders          Ordered    potassium chloride (KLOR-CON) 10  MEQ tablet  Daily        04/06/23 1549              Terald Sleeper, MD 04/06/23 [phone]

## 2023-04-21 ENCOUNTER — Telehealth: Payer: Self-pay | Admitting: Medical Oncology

## 2023-04-21 NOTE — Telephone Encounter (Signed)
Dtr requesting a visit with [facility] to " see where things are" . I told her a CT chest is  advised ,but Hospice will not pay for it . She said Deryck wants a visit with [facility] Ped first.  I told her [facility] said for pt to stay with  hospice . There is  no reason to see him as he is reportedly doing well and not having any issues.

## 2023-09-16 ENCOUNTER — Emergency Department (HOSPITAL_COMMUNITY): Payer: MEDICAID

## 2023-09-16 ENCOUNTER — Encounter: Payer: Self-pay | Admitting: Internal Medicine

## 2023-09-16 ENCOUNTER — Encounter (HOSPITAL_COMMUNITY): Payer: Self-pay

## 2023-09-16 ENCOUNTER — Inpatient Hospital Stay (HOSPITAL_COMMUNITY)
Admission: EM | Admit: 2023-09-16 | Discharge: 2023-09-18 | DRG: 194 | Disposition: A | Discharge status: Hospice | Payer: MEDICAID | Attending: Family Medicine | Admitting: Family Medicine

## 2023-09-16 ENCOUNTER — Other Ambulatory Visit: Payer: Self-pay

## 2023-09-16 DIAGNOSIS — E871 Hypo-osmolality and hyponatremia: Secondary | ICD-10-CM | POA: Diagnosis present

## 2023-09-16 DIAGNOSIS — T403X5A Adverse effect of methadone, initial encounter: Secondary | ICD-10-CM | POA: Diagnosis present

## 2023-09-16 DIAGNOSIS — R509 Fever, unspecified: Secondary | ICD-10-CM | POA: Diagnosis present

## 2023-09-16 DIAGNOSIS — J189 Pneumonia, unspecified organism: Secondary | ICD-10-CM | POA: Diagnosis not present

## 2023-09-16 DIAGNOSIS — Z7982 Long term (current) use of aspirin: Secondary | ICD-10-CM

## 2023-09-16 DIAGNOSIS — D849 Immunodeficiency, unspecified: Secondary | ICD-10-CM | POA: Diagnosis present

## 2023-09-16 DIAGNOSIS — Z9221 Personal history of antineoplastic chemotherapy: Secondary | ICD-10-CM

## 2023-09-16 DIAGNOSIS — E860 Dehydration: Secondary | ICD-10-CM | POA: Diagnosis present

## 2023-09-16 DIAGNOSIS — J42 Unspecified chronic bronchitis: Secondary | ICD-10-CM | POA: Diagnosis present

## 2023-09-16 DIAGNOSIS — R21 Rash and other nonspecific skin eruption: Secondary | ICD-10-CM | POA: Diagnosis present

## 2023-09-16 DIAGNOSIS — R0600 Dyspnea, unspecified: Principal | ICD-10-CM

## 2023-09-16 DIAGNOSIS — I10 Essential (primary) hypertension: Secondary | ICD-10-CM | POA: Diagnosis present

## 2023-09-16 DIAGNOSIS — C3490 Malignant neoplasm of unspecified part of unspecified bronchus or lung: Secondary | ICD-10-CM | POA: Diagnosis present

## 2023-09-16 DIAGNOSIS — Z6835 Body mass index (BMI) 35.0-35.9, adult: Secondary | ICD-10-CM

## 2023-09-16 DIAGNOSIS — M79661 Pain in right lower leg: Secondary | ICD-10-CM | POA: Diagnosis present

## 2023-09-16 DIAGNOSIS — R0902 Hypoxemia: Secondary | ICD-10-CM | POA: Diagnosis present

## 2023-09-16 DIAGNOSIS — E876 Hypokalemia: Secondary | ICD-10-CM | POA: Diagnosis present

## 2023-09-16 DIAGNOSIS — M79662 Pain in left lower leg: Secondary | ICD-10-CM | POA: Diagnosis present

## 2023-09-16 DIAGNOSIS — E861 Hypovolemia: Secondary | ICD-10-CM | POA: Diagnosis present

## 2023-09-16 DIAGNOSIS — M25511 Pain in right shoulder: Secondary | ICD-10-CM | POA: Diagnosis present

## 2023-09-16 DIAGNOSIS — Z79899 Other long term (current) drug therapy: Secondary | ICD-10-CM

## 2023-09-16 DIAGNOSIS — F410 Panic disorder [episodic paroxysmal anxiety] without agoraphobia: Secondary | ICD-10-CM | POA: Diagnosis present

## 2023-09-16 DIAGNOSIS — E669 Obesity, unspecified: Secondary | ICD-10-CM | POA: Diagnosis present

## 2023-09-16 DIAGNOSIS — C799 Secondary malignant neoplasm of unspecified site: Secondary | ICD-10-CM | POA: Diagnosis present

## 2023-09-16 DIAGNOSIS — Z87891 Personal history of nicotine dependence: Secondary | ICD-10-CM

## 2023-09-16 LAB — COMPREHENSIVE METABOLIC PANEL
ALT: 23 U/L (ref 0–44)
AST: 25 U/L (ref 15–41)
Albumin: 4.3 g/dL (ref 3.5–5.0)
Alkaline Phosphatase: 88 U/L (ref 38–126)
Anion gap: 18 — ABNORMAL HIGH (ref 5–15)
BUN: 16 mg/dL (ref 8–23)
CO2: 22 mmol/L (ref 22–32)
Calcium: 9.5 mg/dL (ref 8.9–10.3)
Chloride: 84 mmol/L — ABNORMAL LOW (ref 98–111)
Creatinine, Ser: 0.98 mg/dL (ref 0.61–1.24)
GFR, Estimated: >60 mL/min (ref >60)
Glucose, Bld: 117 mg/dL — ABNORMAL HIGH (ref 70–99)
Potassium: 2.7 mmol/L — CL (ref 3.5–5.1)
Sodium: 124 mmol/L — ABNORMAL LOW (ref 135–145)
Total Bilirubin: 1.2 mg/dL (ref 0.3–1.2)
Total Protein: 8.5 g/dL — ABNORMAL HIGH (ref 6.5–8.1)

## 2023-09-16 LAB — BRAIN NATRIURETIC PEPTIDE: B Natriuretic Peptide: 17.7 pg/mL (ref 0.0–100.0)

## 2023-09-16 LAB — CBC WITH DIFFERENTIAL/PLATELET
Abs Immature Granulocytes: 0.07 10*3/uL (ref 0.00–0.07)
Basophils Absolute: 0.1 10*3/uL (ref 0.0–0.1)
Basophils Relative: 0 %
Eosinophils Absolute: 0.2 10*3/uL (ref 0.0–0.5)
Eosinophils Relative: 1 %
HCT: 32.2 % — ABNORMAL LOW (ref 39.0–52.0)
Hemoglobin: 10.8 g/dL — ABNORMAL LOW (ref 13.0–17.0)
Immature Granulocytes: 1 %
Lymphocytes Relative: 29 %
Lymphs Abs: 4.4 10*3/uL — ABNORMAL HIGH (ref 0.7–4.0)
MCH: 28.7 pg (ref 26.0–34.0)
MCHC: 33.5 g/dL (ref 30.0–36.0)
MCV: 85.6 fL (ref 80.0–100.0)
Monocytes Absolute: 1.7 10*3/uL — ABNORMAL HIGH (ref 0.1–1.0)
Monocytes Relative: 11 %
Neutro Abs: 8.9 10*3/uL — ABNORMAL HIGH (ref 1.7–7.7)
Neutrophils Relative %: 58 %
Platelets: 361 10*3/uL (ref 150–400)
RBC: 3.76 MIL/uL — ABNORMAL LOW (ref 4.22–5.81)
RDW: 12.6 % (ref 11.5–15.5)
WBC: 15.3 10*3/uL — ABNORMAL HIGH (ref 4.0–10.5)
nRBC: 0 % (ref 0.0–0.2)

## 2023-09-16 LAB — TROPONIN I (HIGH SENSITIVITY)
Troponin I (High Sensitivity): 7 ng/L (ref <18)
Troponin I (High Sensitivity): 11 ng/L (ref <18)

## 2023-09-16 LAB — MAGNESIUM: Magnesium: 1.5 mg/dL — ABNORMAL LOW (ref 1.7–2.4)

## 2023-09-16 MED ORDER — IOHEXOL 350 MG/ML SOLN
75.0000 mL | Freq: Once | INTRAVENOUS | Status: AC | PRN
  Administered 2023-09-16: 75 mL via INTRAVENOUS

## 2023-09-16 MED ORDER — MAGNESIUM SULFATE 2 GM/50ML IV SOLN
2.0000 g | Freq: Once | INTRAVENOUS | Status: AC
  Administered 2023-09-16: 2 g via INTRAVENOUS
  Filled 2023-09-16: qty 50

## 2023-09-16 MED ORDER — ONDANSETRON HCL 4 MG PO TABS: 4.0000 mg | ORAL_TABLET | Freq: Four times a day (QID) | ORAL | Status: DC | PRN

## 2023-09-16 MED ORDER — MORPHINE SULFATE (PF) 4 MG/ML IV SOLN
4.0000 mg | Freq: Once | INTRAVENOUS | Status: AC
  Administered 2023-09-16: 4 mg via INTRAVENOUS
  Filled 2023-09-16: qty 1

## 2023-09-16 MED ORDER — ACETAMINOPHEN 325 MG PO TABS: 650.0000 mg | ORAL_TABLET | Freq: Four times a day (QID) | ORAL | Status: DC | PRN

## 2023-09-16 MED ORDER — LORAZEPAM 2 MG/ML IJ SOLN: 1.0000 mg | Freq: Once | INTRAMUSCULAR | Status: DC

## 2023-09-16 MED ORDER — LORAZEPAM 0.5 MG PO TABS
0.5000 mg | ORAL_TABLET | ORAL | Status: DC | PRN
  Administered 2023-09-16 – 2023-09-17 (×2): 0.5 mg via ORAL
  Filled 2023-09-16 (×2): qty 1

## 2023-09-16 MED ORDER — IPRATROPIUM-ALBUTEROL 0.5-2.5 (3) MG/3ML IN SOLN
3.0000 mL | Freq: Once | RESPIRATORY_TRACT | Status: AC
  Administered 2023-09-16: 3 mL via RESPIRATORY_TRACT
  Filled 2023-09-16: qty 3

## 2023-09-16 MED ORDER — DIPHENHYDRAMINE HCL 25 MG PO CAPS
25.0000 mg | ORAL_CAPSULE | Freq: Once | ORAL | Status: AC | PRN
  Administered 2023-09-16: 25 mg via ORAL
  Filled 2023-09-16: qty 1

## 2023-09-16 MED ORDER — SODIUM CHLORIDE 0.9 % IV SOLN: 500.0000 mg | INTRAVENOUS | Status: DC

## 2023-09-16 MED ORDER — ALBUTEROL SULFATE (2.5 MG/3ML) 0.083% IN NEBU: 2.5000 mg | INHALATION_SOLUTION | RESPIRATORY_TRACT | Status: DC | PRN

## 2023-09-16 MED ORDER — SODIUM CHLORIDE 0.9 % IV SOLN
1.0000 g | Freq: Once | INTRAVENOUS | Status: AC
  Administered 2023-09-16: 1 g via INTRAVENOUS
  Filled 2023-09-16: qty 10

## 2023-09-16 MED ORDER — ONDANSETRON HCL 4 MG/2ML IJ SOLN
4.0000 mg | Freq: Once | INTRAMUSCULAR | Status: AC
  Administered 2023-09-16: 4 mg via INTRAVENOUS
  Filled 2023-09-16: qty 2

## 2023-09-16 MED ORDER — POTASSIUM CHLORIDE 10 MEQ/100ML IV SOLN
10.0000 meq | INTRAVENOUS | Status: DC
  Administered 2023-09-16: 10 meq via INTRAVENOUS
  Filled 2023-09-16: qty 100

## 2023-09-16 MED ORDER — ENOXAPARIN SODIUM 40 MG/0.4ML IJ SOSY
40.0000 mg | PREFILLED_SYRINGE | INTRAMUSCULAR | Status: DC
  Administered 2023-09-16: 40 mg via SUBCUTANEOUS
  Filled 2023-09-16: qty 0.4

## 2023-09-16 MED ORDER — POTASSIUM CHLORIDE 10 MEQ/100ML IV SOLN
10.0000 meq | Freq: Once | INTRAVENOUS | Status: AC
  Administered 2023-09-16: 10 meq via INTRAVENOUS
  Filled 2023-09-16: qty 100

## 2023-09-16 MED ORDER — POTASSIUM CHLORIDE CRYS ER 20 MEQ PO TBCR
40.0000 meq | EXTENDED_RELEASE_TABLET | Freq: Once | ORAL | Status: AC
  Administered 2023-09-16: 40 meq via ORAL
  Filled 2023-09-16: qty 2

## 2023-09-16 MED ORDER — ACETAMINOPHEN 650 MG RE SUPP: 650.0000 mg | Freq: Four times a day (QID) | RECTAL | Status: DC | PRN

## 2023-09-16 MED ORDER — ONDANSETRON HCL 4 MG/2ML IJ SOLN: 4.0000 mg | Freq: Four times a day (QID) | INTRAMUSCULAR | Status: DC | PRN

## 2023-09-16 MED ORDER — SODIUM CHLORIDE 0.9 % IV SOLN
2.0000 g | INTRAVENOUS | Status: DC
  Administered 2023-09-17: 2 g via INTRAVENOUS
  Filled 2023-09-16: qty 20

## 2023-09-16 MED ORDER — TRAZODONE HCL 50 MG PO TABS
25.0000 mg | ORAL_TABLET | Freq: Every evening | ORAL | Status: DC | PRN
  Administered 2023-09-16: 25 mg via ORAL
  Filled 2023-09-16: qty 1

## 2023-09-16 MED ORDER — LORAZEPAM 2 MG/ML IJ SOLN
0.5000 mg | Freq: Once | INTRAMUSCULAR | Status: AC
  Administered 2023-09-16: 0.5 mg via INTRAVENOUS
  Filled 2023-09-16: qty 1

## 2023-09-16 MED ORDER — SODIUM CHLORIDE 0.9 % IV SOLN
500.0000 mg | INTRAVENOUS | Status: DC
  Administered 2023-09-16 – 2023-09-17 (×2): 500 mg via INTRAVENOUS
  Filled 2023-09-16 (×3): qty 5

## 2023-09-16 MED ORDER — SODIUM CHLORIDE 0.9 % IV SOLN
INTRAVENOUS | Status: DC
  Administered 2023-09-16: 75 mL/h via INTRAVENOUS

## 2023-09-16 MED ORDER — LORAZEPAM 2 MG/ML IJ SOLN
1.0000 mg | Freq: Once | INTRAMUSCULAR | Status: AC
  Administered 2023-09-16: 1 mg via INTRAVENOUS
  Filled 2023-09-16: qty 1

## 2023-09-16 NOTE — H&P (Addendum)
History and Physical  Willie Schmidt:[id number] DOB: [birth year] DOA: 09/16/2023  PCP: Patient, No Pcp Per   Chief Complaint: Cough  HPI: Willie Schmidt is a 62 y.o. male with medical history significant for hypertension, metastatic stage IV lung adenocarcinoma cancer not on chemotherapy since 2021 currently on hospice who is being admitted to the hospital with concern for possible community-acquired pneumonia.  Patient states that he has been doing well overall, he previously discontinued oncology follow-up and chemotherapy due to inability to tolerate treatment.  He has actually been doing well overall, able to perform his ADLs with the assistance of his family.  Denies any chronic pain, nausea or vomiting, feels that he eats well.  For the last couple of weeks, he has been having bilateral lower extremity pain, and also says that he has had intermittent pain in his left calf.  Also sometimes gets pain in the right calf.  Denies any chest pain.  He started feeling short of breath today, so came to the ER for evaluation.  In the emergency department, ER staff noted that the patient seems to have cycles of severe anxiety which lead to shortness of breath.  He has responded very well to 2 doses of Ativan while in the emergency department and is currently looking very comfortable.  In the emergency department, blood pressure has been stable, he has been tachycardic and saturating well on room air.  Lab work significant for WBC 15, hemoglobin 11, sodium 124, magnesium 1.5, negative troponin, normal BNP.  Liver function tests are normal.  Review of Systems: Please see HPI for pertinent positives and negatives. A complete 10 system review of systems are otherwise negative.  Past Medical History:  Diagnosis Date   Bronchitis, chronic (HCC)    Hypertension    metastatic lung ca dx'd 02/2019   lyphadenopathy, bil pulmonary noduels; suspected bone lesions   History reviewed. No pertinent surgical  history.  Social History:  reports that he has quit smoking. His smoking use included cigarettes. He has never used smokeless tobacco. He reports current alcohol use. He reports that he does not use drugs.   No Known Allergies  Family History  Problem Relation Age of Onset   Chronic Renal Failure Mother      Prior to Admission medications   Medication Sig Start Date End Date Taking? Authorizing Provider  acetaminophen (TYLENOL) 325 MG tablet Take 325 mg by mouth every 6 (six) hours as needed. 06/21/22   [provider]  albuterol (PROVENTIL HFA;VENTOLIN HFA) 108 (90 BASE) MCG/ACT inhaler Inhale 2 puffs into the lungs every 6 (six) hours as needed for wheezing. 02/10/13   Santiago Glad, PA-C  amLODipine (NORVASC) 5 MG tablet TK 1 T PO Q NIGHT 06/25/19   [provider]  aspirin 81 MG tablet Take 81 mg by mouth daily.    [provider]  cyclobenzaprine (FLEXERIL) 10 MG tablet TAKE 1 TABLET BY MOUTH THREE TIMES DAILY AS NEEDED FOR MUSCLE SPASMS 09/30/19   Si Gaul, MD  DULoxetine (CYMBALTA) 30 MG capsule 1 ONCE DAILY FOR 1 WEEK THEN INCREASE TO 1 TWICE DAILY 10/25/19   Si Gaul, MD  folic acid (FOLVITE) 1 MG tablet TAKE 1 TABLET(1 MG) BY MOUTH DAILY 12/27/19   Si Gaul, MD  furosemide (LASIX) 20 MG tablet Take 1 tablet (20 mg total) by mouth daily. 01/19/20   Si Gaul, MD  gabapentin (NEURONTIN) 300 MG capsule Take 1 capsule (300 mg total) by mouth 3 (three) times  daily. 02/09/20   Si Gaul, MD  indomethacin (INDOCIN) 50 MG capsule Take 1 capsule (50 mg total) by mouth 3 (three) times daily with meals. 10/07/19   Heilingoetter, Cassandra L, PA-C  lisinopril-hydrochlorothiazide (ZESTORETIC) 20-25 MG tablet Take 1 tablet by mouth daily. 07/23/19   [provider]  meloxicam (MOBIC) 15 MG tablet TK 1 T PO QD WF 06/01/19   [provider]  methadone (DOLOPHINE) 10 MG tablet Take 10 mg by mouth 3 (three) times daily. 06/24/22    [provider]  Oxycodone HCl 10 MG TABS Take 10 mg by mouth every 6 (six) hours as needed. 06/21/22   [provider]  oxyCODONE-acetaminophen (PERCOCET/ROXICET) 5-325 MG tablet Take 1 tablet by mouth every 6 (six) hours as needed for severe pain. 03/27/20   Si Gaul, MD  potassium chloride (KLOR-CON) 10 MEQ tablet Take 1 tablet (10 mEq total) by mouth daily for 15 doses. 04/06/23 04/21/23  Terald Sleeper, MD  Potassium Chloride ER 20 MEQ TBCR Take 1 tablet by mouth daily. 06/19/22   [provider]  potassium chloride SA (KLOR-CON) 20 MEQ tablet 1 Tablet once daily while taking Lasix 10/20/19   Tanner, Kathrin Greathouse., PA-C  prochlorperazine (COMPAZINE) 10 MG tablet TAKE 1 TABLET(10 MG) BY MOUTH EVERY 6 HOURS AS NEEDED FOR NAUSEA OR VOMITING 09/30/19   Si Gaul, MD    Physical Exam: BP 118/73 (BP Location: Right Wrist)   Pulse (!) 106   Temp 98.4 F (36.9 C) (Oral)   Resp (!) 26   Ht 5\' 9"  (1.753 m)   Wt 108 kg   SpO2 100%   BMI 35.15 kg/m   General:  Alert, oriented, calm, in no acute distress, well-nourished well-developed, overall looks a little bit dry, but very comfortable.  Daughter and wife at bedside. Cardiovascular: RRR, no murmurs or rubs, he has some mild lower extremity peripheral edema Respiratory: clear to auscultation bilaterally, no wheezes, no crackles  Abdomen: soft, nontender, nondistended, normal bowel tones heard  Skin: dry, no rashes  Musculoskeletal: no joint effusions, normal range of motion, left calf slightly tender to palpation, left thigh and calf appear to be slightly larger than the right Psychiatric: appropriate affect, normal speech  Neurologic: extraocular muscles intact, clear speech, moving all extremities with intact sensorium         Labs on Admission:  Basic Metabolic Panel: Recent Labs  Lab 09/16/23 1234 09/16/23 1558  NA 124*  --   K 2.7*  --   CL 84*  --   CO2 22  --   GLUCOSE 117*  --   BUN 16  --    CREATININE 0.98  --   CALCIUM 9.5  --   MG  --  1.5*   Liver Function Tests: Recent Labs  Lab 09/16/23 1234  AST 25  ALT 23  ALKPHOS 88  BILITOT 1.2  PROT 8.5*  ALBUMIN 4.3   No results for input(s): "LIPASE", "AMYLASE" in the last 168 hours. No results for input(s): "AMMONIA" in the last 168 hours. CBC: Recent Labs  Lab 09/16/23 1234  WBC 15.3*  NEUTROABS 8.9*  HGB 10.8*  HCT 32.2*  MCV 85.6  PLT 361   Cardiac Enzymes: No results for input(s): "CKTOTAL", "CKMB", "CKMBINDEX", "TROPONINI" in the last 168 hours.  BNP (last 3 results) Recent Labs    04/06/23 1140 09/16/23 1234  BNP 4.2 17.7    ProBNP (last 3 results) No results for input(s): "PROBNP" in the last  8760 hours.  CBG: No results for input(s): "GLUCAP" in the last 168 hours.  Radiological Exams on Admission: CT Angio Chest PE W and/or Wo Contrast  Result Date: 09/16/2023 CLINICAL DATA:  Stage IV lung cancer shortness of breath EXAM: CT ANGIOGRAPHY CHEST WITH CONTRAST TECHNIQUE: Multidetector CT imaging of the chest was performed using the standard protocol during bolus administration of intravenous contrast. Multiplanar CT image reconstructions and MIPs were obtained to evaluate the vascular anatomy. RADIATION DOSE REDUCTION: This exam was performed according to the departmental dose-optimization program which includes automated exposure control, adjustment of the mA and/or kV according to patient size and/or use of iterative reconstruction technique. CONTRAST:  75mL OMNIPAQUE IOHEXOL 350 MG/ML SOLN COMPARISON:  Chest x-ray 09/16/2023, CT 09/21/2019, CT 03/17/2020, CT 02/14/2019 FINDINGS: Cardiovascular: Satisfactory opacification of the pulmonary arteries to the segmental level. No evidence of pulmonary embolism. Normal heart size. No pericardial effusion. Mild aortic atherosclerosis. Mild coronary vascular calcification Mediastinum/Nodes: Midline trachea. No thyroid mass. Previously noted left  supraclavicular and thoracic inlet nodes appear diminished. AP window soft tissue density measures 2.2 by 2.2 cm, previously 2.7 x 2.5 cm when measured in similar fashion. Similar mild soft tissue thickening at the left hilus. No new suspicious mediastinal lymph nodes. Esophagus within normal limits. Lungs/Pleura: Small left pleural effusion with mild rim enhancement. Ovoid consolidation with calcification and curvilinear density at the posterior left lung base, possibly representing round atelectasis. Resolution of previously noted right middle lobe ground-glass nodule. Diminished posterior right upper lobe pulmonary nodule without measurable nodule on this exam. Left upper lobe bronchial thickening and streaky densities most likely pulmonary scarring. Previously measured left apical ground-glass nodule is also diminished without discretely measurable lung nodule on today's exam Upper Abdomen: No acute finding Musculoskeletal: Stable sclerotic lesion within the posterior T10 vertebral body. No new lesions. Review of the MIP images confirms the above findings. IMPRESSION: 1. Negative for acute pulmonary embolus. 2. Small left pleural effusion with mild rim enhancement, appears chronic. Ovoid consolidation with calcification and curvilinear density at the posterior left lung base, possibly representing round atelectasis. 3. Diminished size of AP window soft tissue density and diminished size of left supraclavicular and thoracic inlet nodes compared to most recent prior CT. 4. Resolution of previously noted right middle lobe ground-glass nodule. Diminished size of previously measured right upper lobe and left apical ground-glass nodules without discretely measurable nodule on today's exam. 5. Stable sclerotic lesion within the posterior T10 vertebral body unchanged. No new suspicious bone lesions 6. Aortic atherosclerosis. Aortic Atherosclerosis (ICD10-I70.0). Electronically Signed   By: Jasmine Pang M.D.   On:  09/16/2023 16:59   DG Chest Portable 1 View  Result Date: 09/16/2023 CLINICAL DATA:  Dyspnea short of breath EXAM: PORTABLE CHEST 1 VIEW COMPARISON:  04/06/2023, 09/21/2019 FINDINGS: Low lung volumes. Mild cardiomegaly. Probable small left-sided pleural effusion. Chronic streaky left perihilar opacities. IMPRESSION: Low lung volumes with mild cardiomegaly and probable small left effusion. Electronically Signed   By: Jasmine Pang M.D.   On: 09/16/2023 15:32    Assessment/Plan ANDARIUS DERSHEM is a 61 y.o. male with medical history significant for hypertension, metastatic stage IV lung adenocarcinoma cancer not on chemotherapy since 2021 currently on hospice who is being admitted to the hospital with concern for possible community-acquired pneumonia.   Community-acquired pneumonia-suspected due to shortness of breath, leukocytosis.  Certainly he is not septic, and he is saturating well on room air. -Observation admission -Supplemental oxygen as needed -Empiric IV azithromycin and IV Rocephin  Anxiety-no prior history of this, but patient with tachypnea and tachycardia which improved significantly with anxiolytic -Low-dose Ativan as needed 3 times daily  Leg pain and edema-lower extremity Doppler ultrasound to rule out DVT, discussed extensively with family and they would be agreeable to anticoagulation in the event of DVT.  Hyponatremia-seems to be asymptomatic, patient does appear dry on exam, and I suspect his electrolyte abnormalities also indicate dehydration/poor nutritional state  Stage IV lung adenocarcinoma-currently on hospice, not pursuing any further treatment.  However given his lower extremity edema and extremity pain, family is anxious to determine whether he has evidence of metastatic cancer in his legs.  We did discuss that further imaging is unlikely to change management, however family is insistent that they would like to know if cancer has spread to his bones. -Will obtain XR  metastatic bone survey  Hypokalemia and hypomagnesemia-repleted, follow with daily labs  Hypertension-potentially due to his anxiety, will monitor  DVT prophylaxis: Lovenox   Full code-discussed with the patient, his daughter, and wife at the bedside at the time of admission.  Consults called: None  Admission status: Observation  Time spent: 59 minutes  Angeline Trick Sharlette Dense MD [facility] Pager [phone]  If 7PM-7AM, please contact night-coverage [email or web address] Password [facility]  09/16/2023, 5:58 PM

## 2023-09-16 NOTE — ED Provider Notes (Signed)
62 year old male with history of metastatic lung cancer on hospice since 2020 presenting for shortness of breath.  Here he is slightly increased work of breathing but is improved.  Appears euvolemic.  No pericardial effusion on ultrasound.  Plan for following up CTA, workup, likely admission.  On assessment he has slightly increased work of breathing.  Mild bilateral lower extremity edema.  Workup.  Notable for possible early pneumonia, no PE.  Lab work notable for leukocytosis, hyponatremia, severe hypokalemia.  Repletion is ordered.  Discussed with hospitalist and admitted.  I did have a conversation with the patient, he states he would want CPR, elevation, mechanical ventilation if his condition were to decline.    Laurence Spates, MD 09/17/23 [phone]

## 2023-09-16 NOTE — ED Provider Notes (Signed)
[place] EMERGENCY DEPARTMENT AT [facility] Provider Note   CSN: [id number] Arrival date & time: 09/16/23  1218     History  Chief Complaint  Patient presents with   Shortness of Breath    Willie Schmidt is a 62 y.o. male.  This is a 62 year old male with stage IV lung cancer who presents emergency department today due to shortness of breath.  Patient is a hospice patient, is not currently undergoing any chemotherapy.  He states that he does not want aggressive and mention.  His daughter who is his power of attorney is at bedside.  Symptoms began earlier today.  He has not had symptoms like this previously.  He does not have any smoking history   Shortness of Breath      Home Medications Prior to Admission medications   Medication Sig Start Date End Date Taking? Authorizing Provider  acetaminophen (TYLENOL) 325 MG tablet Take 325 mg by mouth every 6 (six) hours as needed. 06/21/22   [provider]  albuterol (PROVENTIL HFA;VENTOLIN HFA) 108 (90 BASE) MCG/ACT inhaler Inhale 2 puffs into the lungs every 6 (six) hours as needed for wheezing. 02/10/13   Santiago Glad, PA-C  amLODipine (NORVASC) 5 MG tablet TK 1 T PO Q NIGHT 06/25/19   [provider]  aspirin 81 MG tablet Take 81 mg by mouth daily.    [provider]  cyclobenzaprine (FLEXERIL) 10 MG tablet TAKE 1 TABLET BY MOUTH THREE TIMES DAILY AS NEEDED FOR MUSCLE SPASMS 09/30/19   Si Gaul, MD  DULoxetine (CYMBALTA) 30 MG capsule 1 ONCE DAILY FOR 1 WEEK THEN INCREASE TO 1 TWICE DAILY 10/25/19   Si Gaul, MD  folic acid (FOLVITE) 1 MG tablet TAKE 1 TABLET(1 MG) BY MOUTH DAILY 12/27/19   Si Gaul, MD  furosemide (LASIX) 20 MG tablet Take 1 tablet (20 mg total) by mouth daily. 01/19/20   Si Gaul, MD  gabapentin (NEURONTIN) 300 MG capsule Take 1 capsule (300 mg total) by mouth 3 (three) times daily. 02/09/20   Si Gaul, MD  indomethacin (INDOCIN) 50 MG  capsule Take 1 capsule (50 mg total) by mouth 3 (three) times daily with meals. 10/07/19   Heilingoetter, Cassandra L, PA-C  lisinopril-hydrochlorothiazide (ZESTORETIC) 20-25 MG tablet Take 1 tablet by mouth daily. 07/23/19   [provider]  meloxicam (MOBIC) 15 MG tablet TK 1 T PO QD WF 06/01/19   [provider]  methadone (DOLOPHINE) 10 MG tablet Take 10 mg by mouth 3 (three) times daily. 06/24/22   [provider]  Oxycodone HCl 10 MG TABS Take 10 mg by mouth every 6 (six) hours as needed. 06/21/22   [provider]  oxyCODONE-acetaminophen (PERCOCET/ROXICET) 5-325 MG tablet Take 1 tablet by mouth every 6 (six) hours as needed for severe pain. 03/27/20   Si Gaul, MD  potassium chloride (KLOR-CON) 10 MEQ tablet Take 1 tablet (10 mEq total) by mouth daily for 15 doses. 04/06/23 04/21/23  Terald Sleeper, MD  Potassium Chloride ER 20 MEQ TBCR Take 1 tablet by mouth daily. 06/19/22   [provider]  potassium chloride SA (KLOR-CON) 20 MEQ tablet 1 Tablet once daily while taking Lasix 10/20/19   Tanner, Zenaida Niece E., PA-C  prochlorperazine (COMPAZINE) 10 MG tablet TAKE 1 TABLET(10 MG) BY MOUTH EVERY 6 HOURS AS NEEDED FOR NAUSEA OR VOMITING 09/30/19   Si Gaul, MD      Allergies    Patient has no known allergies.  Review of Systems   Review of Systems  Respiratory:  Positive for shortness of breath.     Physical Exam Updated Vital Signs BP (!) 82/65   Pulse (!) 110   Temp 98.9 F (37.2 C) (Axillary)   Resp (!) 25   Ht 5\' 9"  (1.753 m)   Wt 108 kg   SpO2 100%   BMI 35.15 kg/m  Physical Exam Vitals reviewed.  Constitutional:      Appearance: He is ill-appearing.  HENT:     Head: Normocephalic and atraumatic.  Cardiovascular:     Rate and Rhythm: Tachycardia present.  Pulmonary:     Effort: Tachypnea and respiratory distress present.     Breath sounds: Examination of the right-upper field reveals wheezing. Examination of the  left-upper field reveals wheezing. Examination of the right-middle field reveals wheezing. Examination of the left-middle field reveals wheezing. Examination of the right-lower field reveals wheezing. Examination of the left-lower field reveals wheezing. Wheezing present.  Chest:     Chest wall: No mass or deformity.  Abdominal:     Palpations: Abdomen is soft.  Musculoskeletal:        General: Normal range of motion.     Cervical back: Normal range of motion and neck supple.  Neurological:     General: No focal deficit present.     ED Results / Procedures / Treatments   Labs (all labs ordered are listed, but only abnormal results are displayed) Labs Reviewed  COMPREHENSIVE METABOLIC PANEL  CBC WITH DIFFERENTIAL/PLATELET  BRAIN NATRIURETIC PEPTIDE    EKG None  Radiology No results found.  Procedures Procedures    Medications Ordered in ED Medications  ipratropium-albuterol (DUONEB) 0.5-2.5 (3) MG/3ML nebulizer solution 3 mL (3 mLs Nebulization Given 09/16/23 1255)  morphine (PF) 4 MG/ML injection 4 mg (4 mg Intravenous Given 09/16/23 1255)  ondansetron (ZOFRAN) injection 4 mg (4 mg Intravenous Given 09/16/23 1255)    ED Course/ Medical Decision Making/ A&P                                 Medical Decision Making 62 year old male here today with respiratory distress.  Differential diagnoses include pulmonary edema, pneumonia, PE, pulmonary fibrosis.  Plan -I confirmed the patient's wishes with him and the daughter.  They do not want aggressive interventions but would like treatment of reasonable conditions.  Toward that end, we will then obtain blood cultures on the patient as this would be additional sticks and hospice patient was not requesting that.  Will not order lactic acid as it will not change my treatment.  Will obtain a chest x-ray on the patient, will provide with some morphine and albuterol.  Will obtain a CTA of the patient's chest.  [place]man-patient responded  well to some IV pain medications, anxiolytics.  In fact, patient looks like only person.  He is now speaking in complete sentences, looks a lot more comfortable.  Patient still pending CTA.  Patient did have an elevated white count, I did order Rocephin and azithromycin for him.  Patiently signed out to evening team pending CTA and disposition.  Amount and/or Complexity of Data Reviewed Labs: ordered. Radiology: ordered.  Risk Prescription drug management.           Final Clinical Impression(s) / ED Diagnoses Final diagnoses:  None    Rx / DC Orders ED Discharge Orders     None  Anders Simmonds T, DO 09/16/23 1455

## 2023-09-16 NOTE — ED Triage Notes (Signed)
Wife was driving and pulled over at fire station for SOB. stage 4 lung ca. SOB for 30 min before fire station used inhaler and didn't feel any better. Fire station said RA 90%. Neb 5 mg albuterol at fire station. Rhonchi and wheezing RU. Had 10 total Albuterol and  0.5 Atrovent. PAC and PVC seen during transport. Possible runs of SVT x3. 168-170 for 4 seconds. Last night sharp chest pain. No chest pain currently.  O2 100% RA BP: 120/66 HR:112 RR:38

## 2023-09-16 NOTE — ED Notes (Signed)
ED TO INPATIENT HANDOFF REPORT  Name/Age/Gender Willie Schmidt 62 y.o. male  Code Status    Code Status Orders  (From admission, onward)           Start     Ordered   09/16/23 1814  Full code  (Code Status)  Continuous       Question:  By:  Answer:  Consent: discussion documented in EHR   09/16/23 1813           Code Status History     Date Active Date Inactive Code Status Order ID Comments User Context   09/16/2023 1758 09/16/2023 1813 Limited: Do not attempt resuscitation (DNR) -DNR-LIMITED -Do Not Intubate/DNI  [id number]  Maryln Gottron, MD ED       Home/SNF/Other Home  Chief Complaint CAP (community acquired pneumonia) [J18.9]  Level of Care/Admitting Diagnosis ED Disposition     ED Disposition  Admit   Condition  --   Comment  Hospital Area: [facility] [[id number]]  Level of Care: Med-Surg [16]  May place patient in observation at [facility] or [facility] if equivalent level of care is available:: Yes  Covid Evaluation: Asymptomatic - no recent exposure (last 10 days) testing not required  Diagnosis: CAP (community acquired pneumonia) [782956]  Admitting Physician: Maryln Gottron [[id number]]  Attending Physician: [facility], MIR [email or web address].Roy [[id number]]          Medical History Past Medical History:  Diagnosis Date   Bronchitis, chronic (HCC)    Hypertension    metastatic lung ca dx'd 02/2019   lyphadenopathy, bil pulmonary noduels; suspected bone lesions    Allergies No Known Allergies  IV Location/Drains/Wounds Patient Lines/Drains/Airways Status     Active Line/Drains/Airways     Name Placement date Placement time Site Days   Peripheral IV 09/16/23 20 G Right Antecubital 09/16/23  1240  Antecubital  less than 1   Peripheral IV 09/16/23 20 G Left Antecubital 09/16/23  1436  Antecubital  less than 1            Labs/Imaging Results for orders placed or performed during the hospital encounter of 09/16/23 (from the past  48 hour(s))  Comprehensive metabolic panel     Status: Abnormal   Collection Time: 09/16/23 12:34 PM  Result Value Ref Range   Sodium 124 (L) 135 - 145 mmol/L   Potassium 2.7 (LL) 3.5 - 5.1 mmol/L    Comment: CRITICAL RESULT CALLED TO, READ BACK BY AND VERIFIED WITH  CALLED DOWD, P. AT 1400 ON 09/16/23.    Chloride 84 (L) 98 - 111 mmol/L   CO2 22 22 - 32 mmol/L   Glucose, Bld 117 (H) 70 - 99 mg/dL    Comment: Glucose reference range applies only to samples taken after fasting for at least 8 hours.   BUN 16 8 - 23 mg/dL   Creatinine, Ser 9.52 0.61 - 1.24 mg/dL   Calcium 9.5 8.9 - 84.1 mg/dL   Total Protein 8.5 (H) 6.5 - 8.1 g/dL   Albumin 4.3 3.5 - 5.0 g/dL   AST 25 15 - 41 U/L   ALT 23 0 - 44 U/L   Alkaline Phosphatase 88 38 - 126 U/L   Total Bilirubin 1.2 0.3 - 1.2 mg/dL   GFR, Estimated >32 >44 mL/min    Comment: (NOTE) Calculated using the CKD-EPI Creatinine Equation (2021)    Anion gap 18 (H) 5 - 15    Comment: Performed at [facility], [address] [place], [place] [address]  CBC with Differential     Status: Abnormal   Collection Time: 09/16/23 12:34 PM  Result Value Ref Range   WBC 15.3 (H) 4.0 - 10.5 K/uL   RBC 3.76 (L) 4.22 - 5.81 MIL/uL   Hemoglobin 10.8 (L) 13.0 - 17.0 g/dL   HCT 60.4 (L) 54.0 - 98.1 %   MCV 85.6 80.0 - 100.0 fL   MCH 28.7 26.0 - 34.0 pg   MCHC 33.5 30.0 - 36.0 g/dL   RDW 19.1 47.8 - 29.5 %   Platelets 361 150 - 400 K/uL   nRBC 0.0 0.0 - 0.2 %   Neutrophils Relative % 58 %   Neutro Abs 8.9 (H) 1.7 - 7.7 K/uL   Lymphocytes Relative 29 %   Lymphs Abs 4.4 (H) 0.7 - 4.0 K/uL   Monocytes Relative 11 %   Monocytes Absolute 1.7 (H) 0.1 - 1.0 K/uL   Eosinophils Relative 1 %   Eosinophils Absolute 0.2 0.0 - 0.5 K/uL   Basophils Relative 0 %   Basophils Absolute 0.1 0.0 - 0.1 K/uL   Immature Granulocytes 1 %   Abs Immature Granulocytes 0.07 0.00 - 0.07 K/uL    Comment: Performed at [facility], [address] [place], [place] [address]  Brain natriuretic peptide     Status: None   Collection Time: 09/16/23 12:34 PM  Result Value Ref Range   B Natriuretic Peptide 17.7 0.0 - 100.0 pg/mL    Comment: Performed at [facility], [address], [place], [place] [address]  Troponin I (High Sensitivity)     Status: None   Collection Time: 09/16/23 12:34 PM  Result Value Ref Range   Troponin I (High Sensitivity) 7 <18 ng/L    Comment: (NOTE) Elevated high sensitivity troponin I (hsTnI) values and significant  changes across serial measurements may suggest ACS but many other  chronic and acute conditions are known to elevate hsTnI results.  Refer to the "Links" section for chest pain algorithms and additional  guidance. Performed at [facility], [address] [place], [place] [address]   Troponin I (High Sensitivity)     Status: None   Collection Time: 09/16/23  3:58 PM  Result Value Ref Range   Troponin I (High Sensitivity) 11 <18 ng/L    Comment: (NOTE) Elevated high sensitivity troponin I (hsTnI) values and significant  changes across serial measurements may suggest ACS but many other  chronic and acute conditions are known to elevate hsTnI results.  Refer to the "Links" section for chest pain algorithms and additional  guidance. Performed at [facility], [address], [place], [place] [address]   Magnesium     Status: Abnormal   Collection Time: 09/16/23  3:58 PM  Result Value Ref Range   Magnesium 1.5 (L) 1.7 - 2.4 mg/dL    Comment: Performed at [facility], [address], [place], [place] [address]   CT Angio Chest PE W and/or Wo Contrast  Result Date: 09/16/2023 CLINICAL DATA:  Stage IV lung cancer shortness of breath EXAM: CT ANGIOGRAPHY CHEST WITH CONTRAST TECHNIQUE: Multidetector CT imaging of the chest was performed using the standard protocol during bolus administration of intravenous  contrast. Multiplanar CT image reconstructions and MIPs were obtained to evaluate the vascular anatomy. RADIATION DOSE REDUCTION: This exam was performed according to the departmental dose-optimization program which includes automated exposure control, adjustment of the mA and/or  kV according to patient size and/or use of iterative reconstruction technique. CONTRAST:  75mL OMNIPAQUE IOHEXOL 350 MG/ML SOLN COMPARISON:  Chest x-ray 09/16/2023, CT 09/21/2019, CT 03/17/2020, CT 02/14/2019 FINDINGS: Cardiovascular: Satisfactory opacification of the pulmonary arteries to the segmental level. No evidence of pulmonary embolism. Normal heart size. No pericardial effusion. Mild aortic atherosclerosis. Mild coronary vascular calcification Mediastinum/Nodes: Midline trachea. No thyroid mass. Previously noted left supraclavicular and thoracic inlet nodes appear diminished. AP window soft tissue density measures 2.2 by 2.2 cm, previously 2.7 x 2.5 cm when measured in similar fashion. Similar mild soft tissue thickening at the left hilus. No new suspicious mediastinal lymph nodes. Esophagus within normal limits. Lungs/Pleura: Small left pleural effusion with mild rim enhancement. Ovoid consolidation with calcification and curvilinear density at the posterior left lung base, possibly representing round atelectasis. Resolution of previously noted right middle lobe ground-glass nodule. Diminished posterior right upper lobe pulmonary nodule without measurable nodule on this exam. Left upper lobe bronchial thickening and streaky densities most likely pulmonary scarring. Previously measured left apical ground-glass nodule is also diminished without discretely measurable lung nodule on today's exam Upper Abdomen: No acute finding Musculoskeletal: Stable sclerotic lesion within the posterior T10 vertebral body. No new lesions. Review of the MIP images confirms the above findings. IMPRESSION: 1. Negative for acute pulmonary embolus. 2.  Small left pleural effusion with mild rim enhancement, appears chronic. Ovoid consolidation with calcification and curvilinear density at the posterior left lung base, possibly representing round atelectasis. 3. Diminished size of AP window soft tissue density and diminished size of left supraclavicular and thoracic inlet nodes compared to most recent prior CT. 4. Resolution of previously noted right middle lobe ground-glass nodule. Diminished size of previously measured right upper lobe and left apical ground-glass nodules without discretely measurable nodule on today's exam. 5. Stable sclerotic lesion within the posterior T10 vertebral body unchanged. No new suspicious bone lesions 6. Aortic atherosclerosis. Aortic Atherosclerosis (ICD10-I70.0). Electronically Signed   By: Jasmine Pang M.D.   On: 09/16/2023 16:59   DG Chest Portable 1 View  Result Date: 09/16/2023 CLINICAL DATA:  Dyspnea short of breath EXAM: PORTABLE CHEST 1 VIEW COMPARISON:  04/06/2023, 09/21/2019 FINDINGS: Low lung volumes. Mild cardiomegaly. Probable small left-sided pleural effusion. Chronic streaky left perihilar opacities. IMPRESSION: Low lung volumes with mild cardiomegaly and probable small left effusion. Electronically Signed   By: Jasmine Pang M.D.   On: 09/16/2023 15:32    Pending Labs Unresulted Labs (From admission, onward)     Start     Ordered   09/17/23 0500  Basic metabolic panel  Tomorrow morning,   R        09/16/23 1758   09/17/23 0500  CBC  Tomorrow morning,   R        09/16/23 1758   09/16/23 1758  HIV Antibody (routine testing w rflx)  (HIV Antibody (Routine testing w reflex) panel)  Once,   R        09/16/23 1758            Vitals/Pain Today's Vitals   09/16/23 1415 09/16/23 1435 09/16/23 1648 09/16/23 1700  BP: (!) 127/96   118/73  Pulse: (!) 114   (!) 106  Resp: 20   (!) 26  Temp:   98.4 F (36.9 C)   TempSrc:   Oral   SpO2: 100%   100%  Weight:      Height:      PainSc:  0-No pain  Isolation Precautions No active isolations  Medications Medications  azithromycin (ZITHROMAX) 500 mg in sodium chloride 0.9 % 250 mL IVPB (0 mg Intravenous Stopped 09/16/23 1736)  potassium chloride 10 mEq in 100 mL IVPB (10 mEq Intravenous New Bag/Given 09/16/23 1653)  magnesium sulfate IVPB 2 g 50 mL (2 g Intravenous New Bag/Given 09/16/23 1734)  enoxaparin (LOVENOX) injection 40 mg (has no administration in time range)  acetaminophen (TYLENOL) tablet 650 mg (has no administration in time range)    Or  acetaminophen (TYLENOL) suppository 650 mg (has no administration in time range)  traZODone (DESYREL) tablet 25 mg (has no administration in time range)  ondansetron (ZOFRAN) tablet 4 mg (has no administration in time range)    Or  ondansetron (ZOFRAN) injection 4 mg (has no administration in time range)  albuterol (PROVENTIL) (2.5 MG/3ML) 0.083% nebulizer solution 2.5 mg (has no administration in time range)  cefTRIAXone (ROCEPHIN) 2 g in sodium chloride 0.9 % 100 mL IVPB (has no administration in time range)  ipratropium-albuterol (DUONEB) 0.5-2.5 (3) MG/3ML nebulizer solution 3 mL (3 mLs Nebulization Given 09/16/23 1255)  morphine (PF) 4 MG/ML injection 4 mg (4 mg Intravenous Given 09/16/23 1255)  ondansetron (ZOFRAN) injection 4 mg (4 mg Intravenous Given 09/16/23 1255)  LORazepam (ATIVAN) injection 1 mg (1 mg Intravenous Given 09/16/23 1312)  morphine (PF) 4 MG/ML injection 4 mg (4 mg Intravenous Given 09/16/23 1407)  cefTRIAXone (ROCEPHIN) 1 g in sodium chloride 0.9 % 100 mL IVPB (0 g Intravenous Stopped 09/16/23 1440)  potassium chloride 10 mEq in 100 mL IVPB (0 mEq Intravenous Stopped 09/16/23 1623)  iohexol (OMNIPAQUE) 350 MG/ML injection 75 mL (75 mLs Intravenous Contrast Given 09/16/23 1502)  LORazepam (ATIVAN) injection 0.5 mg (0.5 mg Intravenous Given 09/16/23 1649)    Mobility walks

## 2023-09-17 ENCOUNTER — Observation Stay (HOSPITAL_COMMUNITY): Payer: MEDICAID

## 2023-09-17 ENCOUNTER — Encounter (HOSPITAL_COMMUNITY): Payer: Self-pay | Admitting: Internal Medicine

## 2023-09-17 ENCOUNTER — Telehealth: Payer: Self-pay | Admitting: Medical Oncology

## 2023-09-17 DIAGNOSIS — C799 Secondary malignant neoplasm of unspecified site: Secondary | ICD-10-CM | POA: Diagnosis present

## 2023-09-17 DIAGNOSIS — M79661 Pain in right lower leg: Secondary | ICD-10-CM | POA: Diagnosis present

## 2023-09-17 DIAGNOSIS — F410 Panic disorder [episodic paroxysmal anxiety] without agoraphobia: Secondary | ICD-10-CM | POA: Diagnosis present

## 2023-09-17 DIAGNOSIS — Z9221 Personal history of antineoplastic chemotherapy: Secondary | ICD-10-CM | POA: Diagnosis not present

## 2023-09-17 DIAGNOSIS — E669 Obesity, unspecified: Secondary | ICD-10-CM | POA: Diagnosis present

## 2023-09-17 DIAGNOSIS — E860 Dehydration: Secondary | ICD-10-CM | POA: Diagnosis present

## 2023-09-17 DIAGNOSIS — E861 Hypovolemia: Secondary | ICD-10-CM | POA: Diagnosis present

## 2023-09-17 DIAGNOSIS — M25511 Pain in right shoulder: Secondary | ICD-10-CM | POA: Diagnosis present

## 2023-09-17 DIAGNOSIS — T403X5A Adverse effect of methadone, initial encounter: Secondary | ICD-10-CM | POA: Diagnosis present

## 2023-09-17 DIAGNOSIS — Z87891 Personal history of nicotine dependence: Secondary | ICD-10-CM | POA: Diagnosis not present

## 2023-09-17 DIAGNOSIS — I10 Essential (primary) hypertension: Secondary | ICD-10-CM | POA: Diagnosis present

## 2023-09-17 DIAGNOSIS — Z6835 Body mass index (BMI) 35.0-35.9, adult: Secondary | ICD-10-CM | POA: Diagnosis not present

## 2023-09-17 DIAGNOSIS — J42 Unspecified chronic bronchitis: Secondary | ICD-10-CM | POA: Diagnosis present

## 2023-09-17 DIAGNOSIS — R0902 Hypoxemia: Secondary | ICD-10-CM | POA: Diagnosis present

## 2023-09-17 DIAGNOSIS — M7989 Other specified soft tissue disorders: Secondary | ICD-10-CM | POA: Diagnosis not present

## 2023-09-17 DIAGNOSIS — R21 Rash and other nonspecific skin eruption: Secondary | ICD-10-CM | POA: Diagnosis present

## 2023-09-17 DIAGNOSIS — J189 Pneumonia, unspecified organism: Secondary | ICD-10-CM | POA: Diagnosis present

## 2023-09-17 DIAGNOSIS — M79662 Pain in left lower leg: Secondary | ICD-10-CM | POA: Diagnosis present

## 2023-09-17 DIAGNOSIS — E876 Hypokalemia: Secondary | ICD-10-CM | POA: Diagnosis present

## 2023-09-17 DIAGNOSIS — R509 Fever, unspecified: Secondary | ICD-10-CM | POA: Diagnosis present

## 2023-09-17 DIAGNOSIS — C3490 Malignant neoplasm of unspecified part of unspecified bronchus or lung: Secondary | ICD-10-CM | POA: Diagnosis present

## 2023-09-17 DIAGNOSIS — Z7982 Long term (current) use of aspirin: Secondary | ICD-10-CM | POA: Diagnosis not present

## 2023-09-17 DIAGNOSIS — E871 Hypo-osmolality and hyponatremia: Secondary | ICD-10-CM | POA: Diagnosis present

## 2023-09-17 DIAGNOSIS — D849 Immunodeficiency, unspecified: Secondary | ICD-10-CM | POA: Diagnosis present

## 2023-09-17 DIAGNOSIS — Z79899 Other long term (current) drug therapy: Secondary | ICD-10-CM | POA: Diagnosis not present

## 2023-09-17 LAB — CBC
HCT: 29 % — ABNORMAL LOW (ref 39.0–52.0)
Hemoglobin: 9.6 g/dL — ABNORMAL LOW (ref 13.0–17.0)
MCH: 29.4 pg (ref 26.0–34.0)
MCHC: 33.1 g/dL (ref 30.0–36.0)
MCV: 89 fL (ref 80.0–100.0)
Platelets: 309 10*3/uL (ref 150–400)
RBC: 3.26 MIL/uL — ABNORMAL LOW (ref 4.22–5.81)
RDW: 13.1 % (ref 11.5–15.5)
WBC: 12 10*3/uL — ABNORMAL HIGH (ref 4.0–10.5)
nRBC: 0 % (ref 0.0–0.2)

## 2023-09-17 LAB — BASIC METABOLIC PANEL
Anion gap: 10 (ref 5–15)
BUN: 12 mg/dL (ref 8–23)
CO2: 26 mmol/L (ref 22–32)
Calcium: 8.8 mg/dL — ABNORMAL LOW (ref 8.9–10.3)
Chloride: 93 mmol/L — ABNORMAL LOW (ref 98–111)
Creatinine, Ser: 0.8 mg/dL (ref 0.61–1.24)
GFR, Estimated: >60 mL/min (ref >60)
Glucose, Bld: 123 mg/dL — ABNORMAL HIGH (ref 70–99)
Potassium: 3.1 mmol/L — ABNORMAL LOW (ref 3.5–5.1)
Sodium: 129 mmol/L — ABNORMAL LOW (ref 135–145)

## 2023-09-17 MED ORDER — OXYCODONE-ACETAMINOPHEN 5-325 MG PO TABS: 1.0000 | ORAL_TABLET | Freq: Four times a day (QID) | ORAL | Status: DC | PRN

## 2023-09-17 MED ORDER — LORAZEPAM 0.5 MG PO TABS: 0.5000 mg | ORAL_TABLET | ORAL | Status: DC | PRN

## 2023-09-17 MED ORDER — CYCLOBENZAPRINE HCL 10 MG PO TABS: 10.0000 mg | ORAL_TABLET | Freq: Three times a day (TID) | ORAL | Status: DC | PRN

## 2023-09-17 MED ORDER — ALBUTEROL SULFATE (2.5 MG/3ML) 0.083% IN NEBU: 2.5000 mg | INHALATION_SOLUTION | Freq: Four times a day (QID) | RESPIRATORY_TRACT | Status: DC | PRN

## 2023-09-17 MED ORDER — OXYCODONE HCL 5 MG PO TABS: 5.0000 mg | ORAL_TABLET | Freq: Four times a day (QID) | ORAL | Status: DC | PRN

## 2023-09-17 MED ORDER — ASPIRIN 81 MG PO TBEC
81.0000 mg | DELAYED_RELEASE_TABLET | Freq: Every day | ORAL | Status: DC
  Administered 2023-09-17 – 2023-09-18 (×2): 81 mg via ORAL
  Filled 2023-09-17 (×2): qty 1

## 2023-09-17 MED ORDER — AMLODIPINE BESYLATE 5 MG PO TABS
5.0000 mg | ORAL_TABLET | Freq: Every day | ORAL | Status: DC
  Administered 2023-09-17 – 2023-09-18 (×2): 5 mg via ORAL
  Filled 2023-09-17 (×2): qty 1

## 2023-09-17 MED ORDER — POTASSIUM CHLORIDE IN NACL 40-0.9 MEQ/L-% IV SOLN
INTRAVENOUS | Status: AC
  Filled 2023-09-17 (×2): qty 1000

## 2023-09-17 MED ORDER — BUSPIRONE HCL 5 MG PO TABS
5.0000 mg | ORAL_TABLET | Freq: Two times a day (BID) | ORAL | Status: DC
  Administered 2023-09-17 – 2023-09-18 (×3): 5 mg via ORAL
  Filled 2023-09-17 (×3): qty 1

## 2023-09-17 MED ORDER — PREDNISONE 5 MG PO TABS
5.0000 mg | ORAL_TABLET | Freq: Every day | ORAL | Status: DC
  Administered 2023-09-18: 5 mg via ORAL
  Filled 2023-09-17: qty 1

## 2023-09-17 MED ORDER — PNEUMOCOCCAL 20-VAL CONJ VACC 0.5 ML IM SUSY
0.5000 mL | PREFILLED_SYRINGE | INTRAMUSCULAR | Status: DC
  Filled 2023-09-17: qty 0.5

## 2023-09-17 MED ORDER — DIPHENHYDRAMINE HCL 25 MG PO CAPS
25.0000 mg | ORAL_CAPSULE | Freq: Four times a day (QID) | ORAL | Status: DC | PRN
  Administered 2023-09-17 – 2023-09-18 (×2): 25 mg via ORAL
  Filled 2023-09-17 (×2): qty 1

## 2023-09-17 MED ORDER — INFLUENZA VIRUS VACC SPLIT PF (FLUZONE) 0.5 ML IM SUSY
0.5000 mL | PREFILLED_SYRINGE | INTRAMUSCULAR | Status: DC
  Filled 2023-09-17: qty 0.5

## 2023-09-17 MED ORDER — GABAPENTIN 300 MG PO CAPS
300.0000 mg | ORAL_CAPSULE | Freq: Three times a day (TID) | ORAL | Status: DC
  Administered 2023-09-17 – 2023-09-18 (×3): 300 mg via ORAL
  Filled 2023-09-17 (×3): qty 1

## 2023-09-17 MED ORDER — DIPHENHYDRAMINE-ZINC ACETATE 2-0.1 % EX CREA
TOPICAL_CREAM | Freq: Two times a day (BID) | CUTANEOUS | Status: DC | PRN
  Filled 2023-09-17: qty 28

## 2023-09-17 MED ORDER — ENOXAPARIN SODIUM 60 MG/0.6ML IJ SOSY
50.0000 mg | PREFILLED_SYRINGE | INTRAMUSCULAR | Status: DC
  Administered 2023-09-17: 50 mg via SUBCUTANEOUS
  Filled 2023-09-17: qty 0.6

## 2023-09-17 MED ORDER — METHADONE HCL 10 MG PO TABS: 10.0000 mg | ORAL_TABLET | Freq: Three times a day (TID) | ORAL | Status: DC

## 2023-09-17 MED ORDER — OXYCODONE HCL 10 MG PO TABS: 10.0000 mg | ORAL_TABLET | Freq: Four times a day (QID) | ORAL | Status: DC | PRN

## 2023-09-17 MED ORDER — FUROSEMIDE 20 MG PO TABS: 20.0000 mg | ORAL_TABLET | Freq: Every day | ORAL | Status: DC

## 2023-09-17 MED ORDER — DULOXETINE HCL 30 MG PO CPEP
30.0000 mg | ORAL_CAPSULE | Freq: Two times a day (BID) | ORAL | Status: DC
  Administered 2023-09-17 – 2023-09-18 (×2): 30 mg via ORAL
  Filled 2023-09-17 (×2): qty 1

## 2023-09-17 MED ORDER — HYDROXYZINE HCL 25 MG PO TABS: 25.0000 mg | ORAL_TABLET | ORAL | Status: DC | PRN

## 2023-09-17 MED ORDER — MAGNESIUM OXIDE -MG SUPPLEMENT 400 (240 MG) MG PO TABS
800.0000 mg | ORAL_TABLET | Freq: Two times a day (BID) | ORAL | Status: DC
  Administered 2023-09-17 – 2023-09-18 (×3): 800 mg via ORAL
  Filled 2023-09-17 (×3): qty 2

## 2023-09-17 MED ORDER — OXYCODONE-ACETAMINOPHEN 5-325 MG PO TABS
1.0000 | ORAL_TABLET | Freq: Four times a day (QID) | ORAL | Status: DC | PRN
  Administered 2023-09-18: 1 via ORAL
  Filled 2023-09-17: qty 1

## 2023-09-17 NOTE — Telephone Encounter (Signed)
Pt admitted to [facility] Sudden onset of SOB . ? Pneumonia, atalectasis.

## 2023-09-17 NOTE — Progress Notes (Signed)
Willie Schmidt [id number] [facility] Hospitalized Hospice Patient   Mr Willie Schmidt is a current hospice patient followed for terminal diagnosis of malignant neoplasm of upper lobe left bronchus or lung.  Patient had sudden onset of shortness of breath while riding with spouse on 09/16/23. He was brought to the ED for evaluation and is daughter notified [facility] triage. Patient is admitted to [facility] with diagnosis of pneumonia. Per Dr. Patric Dykes, hospice physician, this is a hospice related hospital admission.  Visited patient and daughter in hospital. Patient denies complaint currently but reports that his shortness of breath episode was very "scary". Daughter relays concerns that her father is not being treated the same as he would be if he were not a hospice patient. Reviewed diagnostic tests, medications, and plan of care in attempt to reassure her. Attending is aware of her concerns and will address as well.    Patient remains GIP appropriate due to need for IV fluids, electrolyte replacement, and IV antibiotics  Vital Signs: 97.5/97/16   151/83     O2 100% on RA  I&O:   1425/400  Abnormal labs: KCL 2.7, 3.1, Ca 8.8, Mag 1.5, WBC 15.3, 12.0, HGB 9.6  Diagnostics:  [place] of lower extremities without significant abnormality PORTABLE CHEST 1 VIEW IMPRESSION: Low lung volumes with mild cardiomegaly and probable small left effusion.  CT ANGIOGRAPHY CHEST WITH CONTRAST IMPRESSION: 1. Negative for acute pulmonary embolus. 2. Small left pleural effusion with mild rim enhancement, appears chronic. Ovoid consolidation with calcification and curvilinear density at the posterior left lung base, possibly representing round atelectasis. 3. Diminished size of AP window soft tissue density and diminished size of left supraclavicular and thoracic inlet nodes compared to most recent prior CT. 4. Resolution of previously noted right middle lobe ground-glass nodule. Diminished size of  previously measured right upper lobe and left apical ground-glass nodules without discretely measurable nodule on today's exam. 5. Stable sclerotic lesion within the posterior T10 vertebral body unchanged. No new suspicious bone lesions 6. Aortic atherosclerosis.   IV/PRN Meds: Duoneb x 1, Ativan 0.5 IV x 1, 1mg  IV x 1, prn 0.5 po x 1, Morphine 4mg  IV x 3, Benadryl 25mg  po x 1, potassium 10 meq IV x 2, Magnesium Sulphate 2G IV x 1, Rocephin 1 G IV x 1, 2 G IV 2 1, Zithromax 500mg  IV x 1, NS with 40 meq KCL IV at 136ml/hr  Problem List: ?  Pneumonia although CT negative Continues on empiric azithromycin and ceftriaxone-never had fever but is immunocompromised White count has responded so may be just has another source?  We will get blood culture for Tmax >100.5 Not sure if this was just a panic attack?  Blood cultures were never done so I am not sure if there is any actual infection Tailor antibiotics to orals and discontinue after 5 days depending on clinical course   Panic attack? Reportedly took 2 doses of Ativan in the emergency room and calm down completely Continue Ativan 0.5 every 4 as needed anxiety-will add BuSpar 5 bid and wean ativan in ~ 2 weeks in the OP setting Supposed to  be on Cymbalta at home--- daughter confirms that hospice has been administering meds   Stage IV NSCLC status post chemo Dr. Shirline Frees previously seen 2021 Ultrasound done at the request of family shows no DVT-DG bone survey, CT abdomen pelvis is pending If all negative can follow-up as an outpatient   Likely hypovolemic hyponatremia Associated/hypokalemia/hypomagnesemia Stop home Lasix, HCTZ for  now as this can cause salt wasting Improved-continue fluids with potassium-replace potassium with magnesium 800 twice daily Recheck labs in the morning  Discharge Planning: Ongoing  Family Contact: Spoke with daughter at bedside.  IDT: Updated  Goals of Care: Full Code  Should patient need ambulance  transfer at discharge- please use [facility]) as they contract this service for our active hospice patients.  Glenna Fellows BSN, RN, [facility] hospital liaison [phone]

## 2023-09-17 NOTE — TOC Initial Note (Signed)
Transition of Care [facility]) - Initial/Assessment Note   Patient Details  Name: Willie Schmidt MRN: [id number] Date of Birth: 1961-11-13  Transition of Care [facility]) CM/SW Contact:    Ewing Schlein, LCSW Phone Number: 09/17/2023, 1:02 PM  Clinical Narrative: Patient is an active [facility] hospice patient from home. TOC following for possible discharge needs.                 Expected Discharge Plan: Home w Hospice Care Barriers to Discharge: Continued Medical Work up  Expected Discharge Plan and Services In-house Referral: Clinical Social Work Discharge Planning Services: CM Consult Post Acute Care Choice: Hospice Living arrangements for the past 2 months: Apartment             DME Arranged: N/A DME Agency: NA  Prior Living Arrangements/Services Living arrangements for the past 2 months: Apartment Lives with:: Significant Other, Adult Children Patient language and need for interpreter reviewed:: Yes Need for Family Participation in Patient Care: Yes (Comment) Care giver support system in place?: Yes (comment) Criminal Activity/Legal Involvement Pertinent to Current Situation/Hospitalization: No - Comment as needed  Activities of Daily Living ADL Screening (condition at time of admission) Does the patient have a NEW difficulty with bathing/dressing/toileting/self-feeding that is expected to last >3 days?: No Does the patient have a NEW difficulty with getting in/out of bed, walking, or climbing stairs that is expected to last >3 days?: No Does the patient have a NEW difficulty with communication that is expected to last >3 days?: No Is the patient deaf or have difficulty hearing?: No Does the patient have difficulty seeing, even when wearing glasses/contacts?: No Does the patient have difficulty concentrating, remembering, or making decisions?: No  Emotional Assessment Orientation: : Oriented to Self, Oriented to Place, Oriented to  Time, Oriented to Situation Alcohol / Substance  Use: Not Applicable Psych Involvement: No (comment)  Admission diagnosis:  CAP (community acquired pneumonia) [J18.9] Dyspnea, unspecified type [R06.00] Fever [R50.9] Patient Active Problem List   Diagnosis Date Noted   Fever 09/17/2023   CAP (community acquired pneumonia) 09/16/2023   Right shoulder pain 04/22/2019   Adenocarcinoma of left lung, stage 4 (HCC) 03/03/2019   Encounter for antineoplastic chemotherapy 03/03/2019   Encounter for antineoplastic immunotherapy 03/03/2019   Goals of care, counseling/discussion 03/03/2019   Neck pain on right side 03/03/2019   Neck pain 02/17/2019   PCP:  Patient, No Pcp Per Pharmacy:   [facility] [phone] - [place], [place] - [address] [place] [address] [place] [address] Phone: [phone] Fax: [phone]  Social Determinants of Health (SDOH) Social History: SDOH Screenings   Food Insecurity: No Food Insecurity (09/17/2023)  Housing: Low Risk  (09/17/2023)  Transportation Needs: No Transportation Needs (09/17/2023)  Utilities: Not At Risk (09/17/2023)  Tobacco Use: Medium Risk (09/17/2023)   SDOH Interventions:    Readmission Risk Interventions     No data to display

## 2023-09-17 NOTE — Progress Notes (Signed)
Left lower extremity venous duplex has been completed. Preliminary results can be found in CV Proc through chart review.   09/17/23 8:53 AM Olen Cordial RVT

## 2023-09-17 NOTE — Progress Notes (Signed)
HOSPITALIST ROUNDING NOTE ADALID HORRALL NWG:[id number]  DOB: 1961/07/15  DOA: 09/16/2023  PCP: Patient, No Pcp Per  09/17/2023,7:05 AM   LOS: 0 days      Code Status: Full   From: Home  current Dispo: Unclear     62 year old black male home dwelling Stage IV [id number] NSCLC adenocarcinoma status post carboplatin Alimta Keytruda since 20 2019 cycles-last oncology visit 11/09/2020 9/24 developed SOB while wife was driving took inhaler did not feel better sats 90% given albuterol Atrovent and runs of SVT were noted CXR = low lung volumes mild cardiomegaly small left pleural effusion  CT angiogram chest negative for pulmonary embolism small left pleural effusion-diminished size AP window soft tissue density and diminished size left supraclavicular and thoracic nodes resolution of previously noted RML nodule Potassium 2.7 sodium 124 chloride 84 BUN/creatinine 16/0.9 magnesium 1.5 BNP 17 troponin 7 WBC 15 hemoglobin 10 BCx 2 were never ordered on admission 9/25 vascular duplex negative for DVT   Plan  ?  Pneumonia although CT negative Continues on empiric azithromycin and ceftriaxone-never had fever but is immunocompromised White count has responded so may be just has another source?  We will get blood culture for Tmax >100.5 Not sure if this was just a panic attack?  Blood cultures were never done so I am not sure if there is any actual infection Tailor antibiotics to orals and discontinue after 5 days depending on clinical course  Panic attack? Reportedly took 2 doses of Ativan in the emergency room and calm down completely Continue Ativan 0.5 every 4 as needed anxiety-will add BuSpar 5 bid and wean ativan in ~ 2 weeks in the OP setting Supposed to  be on Cymbalta at home--- daughter confirms that hospice has been administering meds  Stage IV NSCLC status post chemo Dr. Shirline Frees previously seen 2021 Ultrasound done at the request of family shows no DVT-DG bone survey, CT abdomen pelvis is  pending If all negative can follow-up as an outpatient  Likely hypovolemic hyponatremia Associated/hypokalemia/hypomagnesemia Stop home Lasix, HCTZ for now as this can cause salt wasting Improved-continue fluids with potassium-replace potassium with magnesium 800 twice daily Recheck labs in the morning   DVT prophylaxis: SCD  Status is: Observation The patient will require care spanning > 2 midnights and should be moved to inpatient because:   Requires inpatient level care    Subjective: Awake coherent just back from bone scan No chest pain no fever looks well not on oxygen   Objective + exam Vitals:   09/16/23 1700 09/16/23 1946 09/17/23 0030 09/17/23 0352  BP: 118/73 125/77 113/70 (!) 144/89  Pulse: (!) 106 (!) 103 97 (!) 102  Resp: (!) 26 20 20 20   Temp:  97.9 F (36.6 C) 98.3 F (36.8 C) 97.7 F (36.5 C)  TempSrc:  Oral Oral Oral  SpO2: 100% 100% 100% 100%  Weight:      Height:       Filed Weights   09/16/23 1235  Weight: 108 kg    Examination:  Alert coherent pleasant black male no distress EOMI NCAT thick neck Chest clear Abdomen soft no rebound No lower extremity edema ROM intact Neuro intact  Data Reviewed: reviewed   CBC    Component Value Date/Time   WBC 12.0 (H) 09/17/2023 0622   RBC 3.26 (L) 09/17/2023 0622   HGB 9.6 (L) 09/17/2023 0622   HGB 11.9 (L) 03/22/2020 1006   HCT 29.0 (L) 09/17/2023 0622   PLT 309 09/17/2023 0622  PLT 382 03/22/2020 1006   MCV 89.0 09/17/2023 0622   MCH 29.4 09/17/2023 0622   MCHC 33.1 09/17/2023 0622   RDW 13.1 09/17/2023 0622   LYMPHSABS 4.4 (H) 09/16/2023 1234   MONOABS 1.7 (H) 09/16/2023 1234   EOSABS 0.2 09/16/2023 1234   BASOSABS 0.1 09/16/2023 1234      Latest Ref Rng & Units 09/16/2023   12:34 PM 04/06/2023   11:40 AM 03/22/2020   10:06 AM  CMP  Glucose 70 - 99 mg/dL 161  096  045   BUN 8 - 23 mg/dL 16  10  13    Creatinine 0.61 - 1.24 mg/dL 4.09  8.11  9.14   Sodium 135 - 145 mmol/L 124   123  133   Potassium 3.5 - 5.1 mmol/L 2.7  2.8  3.8   Chloride 98 - 111 mmol/L 84  80  97   CO2 22 - 32 mmol/L 22  28  24    Calcium 8.9 - 10.3 mg/dL 9.5  9.3  9.4   Total Protein 6.5 - 8.1 g/dL 8.5  8.2  9.3   Total Bilirubin 0.3 - 1.2 mg/dL 1.2  0.5  0.5   Alkaline Phos 38 - 126 U/L 88  98  143   AST 15 - 41 U/L 25  24  68   ALT 0 - 44 U/L 23  25  30       Scheduled Meds:  enoxaparin (LOVENOX) injection  40 mg Subcutaneous Q24H   [START ON 09/18/2023] influenza vac split trivalent PF  0.5 mL Intramuscular Tomorrow-1000   [START ON 09/18/2023] pneumococcal 20-valent conjugate vaccine  0.5 mL Intramuscular Tomorrow-1000   Continuous Infusions:  sodium chloride 75 mL/hr at 09/17/23 0400   azithromycin Stopped (09/16/23 1736)   cefTRIAXone (ROCEPHIN)  IV      Time 50  Rhetta Mura, MD  [facility]

## 2023-09-17 NOTE — Plan of Care (Signed)

## 2023-09-17 NOTE — Progress Notes (Signed)
Mobility Specialist - Progress Note   09/17/23 1344  Mobility  Activity Ambulated with assistance in hallway  Level of Assistance Modified independent, requires aide device or extra time  Assistive Device Other (Comment) (IV Pole)  Distance Ambulated (ft) 275 ft  Activity Response Tolerated well  Mobility Referral Yes  $Mobility charge 1 Mobility  Mobility Specialist Start Time (ACUTE ONLY) 0131  Mobility Specialist Stop Time (ACUTE ONLY) 0143  Mobility Specialist Time Calculation (min) (ACUTE ONLY) 12 min   Pt received in bed and agreeable to mobility. No complaints during session. Pt to EOB after session with all needs met.    [facility]

## 2023-09-17 NOTE — Progress Notes (Addendum)
Willie Schmidt [address]   Hospice liaison note     This patient is a current hospice patient with [facility].    Liaison will continue to follow for any discharge planning needs and to coordinate continuation of hospice care.    Please don't hesitate to call with any Hospice related questions or concerns.    Thank you for the opportunity to participate in this patient's care.  Glenna Fellows, BSN, RN, OCN [employer] [phone]

## 2023-09-18 DIAGNOSIS — J189 Pneumonia, unspecified organism: Secondary | ICD-10-CM | POA: Diagnosis not present

## 2023-09-18 LAB — CBC
HCT: 30.9 % — ABNORMAL LOW (ref 39.0–52.0)
Hemoglobin: 9.7 g/dL — ABNORMAL LOW (ref 13.0–17.0)
MCH: 28.4 pg (ref 26.0–34.0)
MCHC: 31.4 g/dL (ref 30.0–36.0)
MCV: 90.6 fL (ref 80.0–100.0)
Platelets: 316 10*3/uL (ref 150–400)
RBC: 3.41 MIL/uL — ABNORMAL LOW (ref 4.22–5.81)
RDW: 13.1 % (ref 11.5–15.5)
WBC: 11.7 10*3/uL — ABNORMAL HIGH (ref 4.0–10.5)
nRBC: 0 % (ref 0.0–0.2)

## 2023-09-18 LAB — COMPREHENSIVE METABOLIC PANEL
ALT: 21 U/L (ref 0–44)
AST: 17 U/L (ref 15–41)
Albumin: 3.8 g/dL (ref 3.5–5.0)
Alkaline Phosphatase: 83 U/L (ref 38–126)
Anion gap: 9 (ref 5–15)
BUN: 8 mg/dL (ref 8–23)
CO2: 26 mmol/L (ref 22–32)
Calcium: 9 mg/dL (ref 8.9–10.3)
Chloride: 97 mmol/L — ABNORMAL LOW (ref 98–111)
Creatinine, Ser: 0.91 mg/dL (ref 0.61–1.24)
GFR, Estimated: >60 mL/min (ref >60)
Glucose, Bld: 106 mg/dL — ABNORMAL HIGH (ref 70–99)
Potassium: 3.3 mmol/L — ABNORMAL LOW (ref 3.5–5.1)
Sodium: 132 mmol/L — ABNORMAL LOW (ref 135–145)
Total Bilirubin: 0.6 mg/dL (ref 0.3–1.2)
Total Protein: 7.8 g/dL (ref 6.5–8.1)

## 2023-09-18 LAB — MAGNESIUM: Magnesium: 1.7 mg/dL (ref 1.7–2.4)

## 2023-09-18 MED ORDER — HYDROCORTISONE 1 % EX CREA
TOPICAL_CREAM | Freq: Two times a day (BID) | CUTANEOUS | Status: DC
  Filled 2023-09-18: qty 28

## 2023-09-18 MED ORDER — FUROSEMIDE 40 MG PO TABS: 20.0000 mg | ORAL_TABLET | Freq: Two times a day (BID) | ORAL | 0 refills | Status: DC

## 2023-09-18 MED ORDER — ASPIRIN 81 MG PO TBEC: 81.0000 mg | DELAYED_RELEASE_TABLET | Freq: Every day | ORAL | Status: DC

## 2023-09-18 MED ORDER — OXYCODONE-ACETAMINOPHEN 10-325 MG PO TABS: ORAL_TABLET | ORAL | Status: DC

## 2023-09-18 MED ORDER — POTASSIUM CHLORIDE IN NACL 40-0.9 MEQ/L-% IV SOLN
INTRAVENOUS | Status: DC
  Filled 2023-09-18: qty 1000

## 2023-09-18 MED ORDER — DIPHENHYDRAMINE-ZINC ACETATE 2-0.1 % EX CREA: TOPICAL_CREAM | Freq: Two times a day (BID) | CUTANEOUS | 0 refills | Status: DC | PRN

## 2023-09-18 MED ORDER — DOXYCYCLINE HYCLATE 100 MG PO TABS: 100.0000 mg | ORAL_TABLET | Freq: Two times a day (BID) | ORAL | Status: DC

## 2023-09-18 MED ORDER — DOXYCYCLINE HYCLATE 100 MG PO TABS: 100.0000 mg | ORAL_TABLET | Freq: Two times a day (BID) | ORAL | 0 refills | Status: DC

## 2023-09-18 NOTE — Progress Notes (Signed)
Mobility Specialist - Progress Note   09/18/23 1017  Mobility  Activity Ambulated independently in hallway  Level of Assistance Independent  Assistive Device None  Distance Ambulated (ft) 275 ft  Activity Response Tolerated well  Mobility Referral Yes  $Mobility charge 1 Mobility  Mobility Specialist Start Time (ACUTE ONLY) 0950  Mobility Specialist Stop Time (ACUTE ONLY) 1017  Mobility Specialist Time Calculation (min) (ACUTE ONLY) 27 min   Pt received EOB and agreeable to mobility. No complaints during session. Pt to EOB after session with all needs met.    [facility]

## 2023-09-18 NOTE — Evaluation (Signed)
Occupational Therapy Evaluation Patient Details Name: Willie Schmidt MRN: [id number] DOB: [birth year] Today's Date: 09/18/2023   History of Present Illness Willie Schmidt is a 62 y.o. male with medical history significant for hypertension, metastatic stage IV lung adenocarcinoma cancer not on chemotherapy since 2021 currently on hospice who is being admitted to the hospital with concern for possible community-acquired pneumonia. Pt also reporting difficulty with use of RT UE and OT asked to see.   Clinical Impression   Patient evaluated by Occupational Therapy with no further acute OT needs identified. All education has been completed and the patient has no further questions.  See below for any follow-up Occupational Therapy or equipment needs. OT is signing off. Defer to Outpatient PT or OT (recommend PT as there seems to be possible neck involvement.) Thank you for this referral.        If plan is discharge home, recommend the following: Assist for transportation;Assistance with cooking/housework;Supervision due to cognitive status    Functional Status Assessment  Patient has had a recent decline in their functional status and demonstrates the ability to make significant improvements in function in a reasonable and predictable amount of time.  Equipment Recommendations  None recommended by OT    Recommendations for Other Services       Precautions / Restrictions Restrictions Weight Bearing Restrictions: No      Mobility Bed Mobility Overal bed mobility: Modified Independent                  Transfers Overall transfer level: Modified independent                        Balance Overall balance assessment: Mild deficits observed, not formally tested                                         ADL either performed or assessed with clinical judgement   ADL Overall ADL's : At baseline                                       General  ADL Comments: Pt shown how to don/doff an overhead shirt for RT shoulder ease and pt demonstrated back well. Pt favors his doinant LUE for other ADLs and pt demonstrating bathroom mobility without assistance.     Vision   Vision Assessment?: No apparent visual deficits     Perception         Praxis         Pertinent Vitals/Pain Pain Assessment Pain Assessment: 0-10 Pain Score: 6  Pain Location: RT neck and shoulder. Tender to touch with supraspinatus, infraspinatus palpation. Very tight upper traps and lev sacpulae. Pain with RUE Internal rotation. Pain Descriptors / Indicators: Burning, Tender Pain Intervention(s): Limited activity within patient's tolerance     Extremity/Trunk Assessment Upper Extremity Assessment Upper Extremity Assessment: Left hand dominant;RUE deficits/detail RUE Deficits / Details: Pain with seated flexion, less so with supine shoulder flexion. Very limited (-10 degrees shy of neutral) and painful ER in low position. + Lift off IR test with pain. +Hawkins Kennedy for impingement.  Reports RT neck and shoulder girdle pain x 4 years. RUE: Shoulder pain with ROM RUE Sensation: WNL RUE Coordination: WNL   Lower Extremity Assessment Lower Extremity Assessment: Overall [facility]  for tasks assessed   Cervical / Trunk Assessment Cervical / Trunk Assessment: Normal   Communication Communication Communication: No apparent difficulties Cueing Techniques: Verbal cues;Visual cues;Gestural cues   Cognition Arousal: Alert Behavior During Therapy: WFL for tasks assessed/performed Overall Cognitive Status: History of cognitive impairments - at baseline                                 General Comments: Pt is aware of cognitive impairments. Slower processing, and difficulty with multi-step instructions. But does well with cues and increased time.     General Comments       Exercises Other Exercises Other Exercises: Handout provded with Stage 1 and  Stage 2 shouolder impingement protocols. Pt may not have an impingement and will defer to outpatient for further testing.  Pt performed upper trap elevation, scapular retractions, supine chin tucks, right shoulder impingement stretch for internal rotation, modified runner stretch in supine for pain management, supine military press with shoulder flexion and 90 degrees, followed by serratus anterior strengthening which patient tolerated well, denying increased pain. Other Exercises: Patient was advised to avoid any increased pain during exercises or stretches.  Patient given repetitions and frequency of each exercise per handout.  Patient and significant other educated on use of comfortable heating pads prior to exercise and use of cool packs after exercises.   Shoulder Instructions      Home Living Family/patient expects to be discharged to:: Private residence Living Arrangements: Spouse/significant other Available Help at Discharge: Available 24 hours/day Type of Home: House Home Access: Stairs to enter [employer] of Steps: 5 Entrance Stairs-Rails: Left;Right Home Layout: One level     Bathroom Shower/Tub: Chief Strategy Officer: Standard                Prior Functioning/Environment Prior Level of Function : Independent/Modified Independent             Mobility Comments: Independent ADLs Comments: Significant other drives and provides IADLs.        OT Problem List: Decreased range of motion;Pain;Impaired UE functional use      OT Treatment/Interventions:      OT Goals(Current goals can be found in the care plan section) Acute Rehab OT Goals Patient Stated Goal: Decreased pain in RT neck/shoulder OT Goal Formulation: All assessment and education complete, DC therapy Potential to Achieve Goals: Fair (4 year h/o RT shoulder issues) ADL Goals Pt Will Perform Upper Body Dressing: with supervision;sitting (sequencing cues for pain  mangement) Pt/caregiver will Perform Home Exercise Program: With written HEP provided;With Supervision;Right Upper extremity  OT Frequency:      Co-evaluation              AM-PAC OT "6 Clicks" Daily Activity     Outcome Measure Help from another person eating meals?: None Help from another person taking care of personal grooming?: None Help from another person toileting, which includes using toliet, bedpan, or urinal?: None Help from another person bathing (including washing, rinsing, drying)?: None Help from another person to put on and taking off regular upper body clothing?: None Help from another person to put on and taking off regular lower body clothing?: None 6 Click Score: 24   End of Session Nurse Communication: Other (comment) (OT completed. Defer to outpatient therapy)  Activity Tolerance: Patient tolerated treatment well Patient left: in bed;with family/visitor present  OT Visit Diagnosis: Pain;Muscle weakness (generalized) (M62.81) Pain -  Right/Left: Right Pain - part of body: Shoulder                Time: 1610-9604 OT Time Calculation (min): 38 min Charges:  OT General Charges $OT Visit: 1 Visit OT Evaluation $OT Eval Low Complexity: 1 Low OT Treatments $Therapeutic Exercise: 23-37 mins  Victorino Dike, OT Acute Rehab Services Office: [phone] 09/18/2023  Theodoro Clock 09/18/2023, 1:51 PM

## 2023-09-18 NOTE — TOC Progression Note (Addendum)
Transition of Care [facility]) - Progression Note    Patient Details  Name: KINGJAMES LOADHOLT MRN: [id number] Date of Birth: 11/23/1961  [facility] of [facility]) CM/SW Contact  Beckie Busing, RN Phone [phone]  09/18/2023, 1:53 PM  Clinical Narrative:    Ambulatory referral for Outpatient PT has been entered per MD request. Info has been added to AVS.    Expected Discharge Plan: Home w Hospice Care Barriers to Discharge: Continued Medical Work up  Expected Discharge Plan and Services In-house Referral: Clinical Social Work Discharge Planning Services: CM Consult Post Acute Care Choice: Hospice Living arrangements for the past 2 months: Apartment Expected Discharge Date: 09/18/23               DME Arranged: N/A DME Agency: NA                   Social Determinants of Health (SDOH) Interventions SDOH Screenings   Food Insecurity: No Food Insecurity (09/17/2023)  Housing: Low Risk  (09/17/2023)  Transportation Needs: No Transportation Needs (09/17/2023)  Utilities: Not At Risk (09/17/2023)  Tobacco Use: Medium Risk (09/17/2023)    Readmission Risk Interventions     No data to display

## 2023-09-18 NOTE — Plan of Care (Signed)

## 2023-09-18 NOTE — Discharge Summary (Signed)
Physician Discharge Summary  Willie Schmidt WUJ:[id number] DOB: Jan 09, 1961 DOA: 09/16/2023  PCP: Dr. Billy Coast   Admit date: 09/16/2023 Discharge date: 09/18/2023  Time spent: 36 minutes  Recommendations for Outpatient Follow-up:  Requires adjustment of Lasix etc. in the outpatient setting as has a tendency to develop lower extremity edema Needs labs in 1 week at Dr. Adaline Sill office and follow-up Complete doxycycline as an outpatient  Discharge Diagnoses:  MAIN problem for hospitalization   Hypoxia query cause at time of discharge Underlying stage IV NSCLC on hospice for this indication-to be revisited as an outpatient Hyponatremia hypokalemia Skin rash likely allergic reaction to opiates versus eczema added/  Please see below for itemized issues addressed in HOpsital- refer to other progress notes for clarity if needed  Discharge Condition: Improved  Diet recommendation: Regular  Filed Weights   09/16/23 1235  Weight: 108 kg    History of present illness:  62 year old black male home dwelling Stage IV [id number] NSCLC adenocarcinoma status post carboplatin Alimta Keytruda since 2019 cycles-last oncology visit 11/09/2020   9/24 developed SOB while wife was driving took inhaler did not feel better sats 90% given albuterol Atrovent and runs of SVT were noted CXR = low lung volumes mild cardiomegaly small left pleural effusion  CT angiogram chest negative for pulmonary embolism small left pleural effusion-diminished size AP window soft tissue density and diminished size left supraclavicular and thoracic nodes resolution of previously noted RML nodule   Potassium 2.7 sodium 124 chloride 84 BUN/creatinine 16/0.9 magnesium 1.5 BNP 17 troponin 7 WBC 15 hemoglobin 10 BCx 2 were never ordered on admission 9/25 vascular duplex negative for DVT 9/25 bone scan was negative for malignancy     Plan   Recently treated outpatient lower extremity cellulitis This could be the cause for  mild leukocytosis His white count dropped reliably to 11 with broad-spectrum antibiotics and I do not think he has an overt infection he may have had some skin denudation but I do not think he has anything concerning or emergent and we we will continue his usual Atarax and topical creams until he can follow-up with Dr. 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 Arbutus Ped in the outpatient setting with regards to further workup although may benefit from outpatient PET scan to see activity of disease-he can use Sarna at this time for this Dr. Shirline Frees is aware and will set this up in the outpatient setting   Panic attack? Reportedly took 2 doses of Ativan in the emergency room and calm down completely resumed home Cymbalta 30 twice daily, hydroxyzine 25 every 4 as needed itching, trazodone 25 at bedtime for sleep  Stage IV NSCLC status post chemo Dr. Shirline Frees previously seen 2021 Ultrasound done at the request of family shows no DVT-DG bone  survey Outpatient follow-up with Dr. Shirline Frees already set up as above   Likely hypovolemic hyponatremia Mild hypokalemia Resolved with IV fluid as well as potassium replacement-prior to admission patient was on Zestoretic in addition to Lasix-I  have discontinued the Zestoretic and cut back the dose of Lasix as this can cause kaliuresis He will need labs in a week  HTN Continue amlodipine alone    Discharge Exam: Vitals:   09/18/23 0649 09/18/23 1008  BP: 123/83 136/87  Pulse: 94   Resp:    Temp: 98 F (36.7 C)   SpO2: 98%     Subj on day of d/c   Awake coherent no distress ambulatory  General Exam on discharge  EOMI NCAT no focal deficit no icterus no pallor Restricted ROM to right upper extremity with bicipital groove tenderness, empty can test Neer test somewhat positive Internal rotation slightly tender Macular rash all over legs mild scabs on abdomen which seem well-healed with no open wounds  Discharge Instructions   Discharge Instructions     Diet - low sodium heart healthy   Complete by: As directed    Discharge instructions   Complete by: As directed    Please review meds carefully-use only half a tablet of the Percocet until you are seen by hospice and  Continue with the exercises from therapy on the right upper extremity as an outpatient   Dr. Shirline Frees will be in touch with you to set up an outpatient visit to discuss your concerns about cancer and what next steps are needed to be taken  please follow-up with Dr. Loleta Chance for routine medical issues as he may require dermatologist to assist with management if the rashes on his skin do not improve He will need labs in a week at Dr. Jorge Ny office   Increase activity slowly   Complete by: As directed       Allergies as of 09/18/2023   No Known Allergies      Medication List     STOP taking these medications    aspirin 81 MG tablet Replaced by: aspirin EC 81 MG tablet   cephALEXin 500 MG capsule Commonly known as: KEFLEX   guaiFENesin 100 MG/5ML liquid Commonly known as: ROBITUSSIN   lisinopril-hydrochlorothiazide 20-25 MG tablet Commonly known as: ZESTORETIC   LORazepam 0.5 MG tablet Commonly known as: ATIVAN   methadone 10 MG  tablet Commonly known as: DOLOPHINE   metolazone 5 MG tablet Commonly known as: ZAROXOLYN   mupirocin ointment 2 % Commonly known as: BACTROBAN   prochlorperazine 10 MG tablet Commonly known as: COMPAZINE   sorbitol 70 % solution       TAKE these medications    albuterol (2.5 MG/3ML) 0.083% nebulizer solution Commonly known as: PROVENTIL Take 2.5 mg by nebulization every 6 (six) hours as needed for wheezing or shortness of breath.   albuterol 108 (90 Base) MCG/ACT inhaler Commonly known as: VENTOLIN HFA Inhale 2 puffs into the lungs every 6 (six) hours as needed for wheezing.   amLODipine 5 MG tablet Commonly known as: NORVASC Take 5 mg by mouth daily.   aspirin EC 81 MG tablet Take 1 tablet (81 mg total) by mouth daily. Swallow whole. Start taking on: September 19, 2023 Replaces: aspirin 81 MG tablet   camphor-menthol lotion Commonly known as: SARNA Apply 1 Application topically daily as needed for itching (dry skin).   cyclobenzaprine 10 MG tablet Commonly known as: FLEXERIL TAKE 1 TABLET BY MOUTH THREE TIMES DAILY AS NEEDED  FOR MUSCLE SPASMS What changed:  reasons to take this additional instructions   diphenhydrAMINE 25 mg capsule Commonly known as: BENADRYL Take 25 mg by mouth every 6 (six) hours as needed for itching.   diphenhydrAMINE-zinc acetate cream Commonly known as: BENADRYL Apply topically 2 (two) times daily as needed for itching.   doxycycline 100 MG tablet Commonly known as: VIBRA-TABS Take 1 tablet (100 mg total) by mouth every 12 (twelve) hours.   DULoxetine 30 MG capsule Commonly known as: CYMBALTA 1 ONCE DAILY FOR 1 WEEK THEN INCREASE TO 1 TWICE DAILY What changed: See the new instructions.   furosemide 40 MG tablet Commonly known as: LASIX Take 0.5 tablets (20 mg total) by mouth 2 (two) times daily. What changed:  how much to take when to take this   gabapentin 300 MG capsule Commonly known as: NEURONTIN Take 1 capsule  (300 mg total) by mouth 3 (three) times daily.   Gaviscon Extra Strength 254-237.5 MG/5ML Susp Generic drug: Alum Hydroxide-Mag Carbonate Take 15 mLs by mouth every 4 (four) hours as needed (gas, indigestion).   hydrOXYzine 25 MG tablet Commonly known as: ATARAX Take 25 mg by mouth every 6 (six) hours as needed for itching.   oxyCODONE-acetaminophen 10-325 MG tablet Commonly known as: PERCOCET Use the tablets that you have at home but instead of a full tablet take 1/2 tablet every 6 as needed for moderate pain Preferentially use Robaxin for muscle spasm in the right neck area What changed:  how much to take how to take this when to take this reasons to take this additional instructions   polyethylene glycol 17 g packet Commonly known as: MIRALAX / GLYCOLAX Take 17 g by mouth daily as needed for mild constipation.   potassium chloride SA 20 MEQ tablet Commonly known as: KLOR-CON M 1 Tablet once daily while taking Lasix What changed:  how much to take how to take this when to take this additional instructions   predniSONE 5 MG tablet Commonly known as: DELTASONE Take 5 mg by mouth daily.   sennosides-docusate sodium 8.6-50 MG tablet Commonly known as: SENOKOT-S Take 2 tablets by mouth 2 (two) times daily.       No Known Allergies    The results of significant diagnostics from this hospitalization (including imaging, microbiology, ancillary and laboratory) are listed below for reference.    Significant Diagnostic Studies: VAS [place] LOWER EXTREMITY VENOUS (DVT)  Result Date: 09/17/2023  Lower Venous DVT Study Patient Name:  RENNER SEABROOK  Date of Exam:   09/17/2023 Medical Rec #: [id number]      Accession #:    [id number] Date of Birth: 30-Dec-1960     Patient Gender: M Patient Age:   62 years Exam Location:  [facility] Procedure:      VAS [place] LOWER EXTREMITY VENOUS (DVT) Referring Phys: [facility]  --------------------------------------------------------------------------------  Indications: Swelling.  Risk Factors: Cancer. Limitations: Poor ultrasound/tissue interface and body habitus. Comparison Study: No prior studies. Performing Technologist: Chanda Busing RVT  Examination Guidelines: A complete evaluation includes B-mode imaging, spectral Doppler, color Doppler, and power Doppler as needed of all accessible portions of each vessel. Bilateral testing is considered an integral part of a complete examination. Limited examinations for reoccurring indications may be performed as noted. The reflux portion of the exam is performed with the patient in reverse Trendelenburg.  +-----+---------------+---------+-----------+----------+--------------+ RIGHTCompressibilityPhasicitySpontaneityPropertiesThrombus Aging +-----+---------------+---------+-----------+----------+--------------+ CFV  Full           Yes  Yes                                 +-----+---------------+---------+-----------+----------+--------------+   +---------+---------------+---------+-----------+----------+--------------+ LEFT     CompressibilityPhasicitySpontaneityPropertiesThrombus Aging +---------+---------------+---------+-----------+----------+--------------+ CFV      Full           Yes      Yes                                 +---------+---------------+---------+-----------+----------+--------------+ SFJ      Full                                                        +---------+---------------+---------+-----------+----------+--------------+ FV Prox  Full                                                        +---------+---------------+---------+-----------+----------+--------------+ FV Mid   Full                                                        +---------+---------------+---------+-----------+----------+--------------+ FV Distal               Yes      Yes                                  +---------+---------------+---------+-----------+----------+--------------+ PFV      Full                                                        +---------+---------------+---------+-----------+----------+--------------+ POP      Full           Yes      Yes                                 +---------+---------------+---------+-----------+----------+--------------+ PTV      Full                                                        +---------+---------------+---------+-----------+----------+--------------+ PERO     Full                                                        +---------+---------------+---------+-----------+----------+--------------+    Summary: RIGHT: - No evidence of common femoral vein  obstruction.   LEFT: - There is no evidence of deep vein thrombosis in the lower extremity. However, portions of this examination were limited- see technologist comments above.  - No cystic structure found in the popliteal fossa.  *See table(s) above for measurements and observations. Electronically signed by Coral Else MD on 09/17/2023 at 10:17:45 PM.    Final    DG Bone Survey Met  Result Date: 09/17/2023 CLINICAL DATA:  Metastatic stage IV lung adenocarcinoma. EXAM: METASTATIC BONE SURVEY COMPARISON:  CT chest 08/27/2023 FINDINGS: No lytic or sclerotic lesion identified on skeletal survey. IMPRESSION: No plain film evidence of skeletal metastasis. Electronically Signed   By: Genevive Bi M.D.   On: 09/17/2023 14:52   CT Angio Chest PE W and/or Wo Contrast  Result Date: 09/16/2023 CLINICAL DATA:  Stage IV lung cancer shortness of breath EXAM: CT ANGIOGRAPHY CHEST WITH CONTRAST TECHNIQUE: Multidetector CT imaging of the chest was performed using the standard protocol during bolus administration of intravenous contrast. Multiplanar CT image reconstructions and MIPs were obtained to evaluate the vascular anatomy. RADIATION DOSE REDUCTION: This exam was performed  according to the departmental dose-optimization program which includes automated exposure control, adjustment of the mA and/or kV according to patient size and/or use of iterative reconstruction technique. CONTRAST:  75mL OMNIPAQUE IOHEXOL 350 MG/ML SOLN COMPARISON:  Chest x-ray 09/16/2023, CT 09/21/2019, CT 03/17/2020, CT 02/14/2019 FINDINGS: Cardiovascular: Satisfactory opacification of the pulmonary arteries to the segmental level. No evidence of pulmonary embolism. Normal heart size. No pericardial effusion. Mild aortic atherosclerosis. Mild coronary vascular calcification Mediastinum/Nodes: Midline trachea. No thyroid mass. Previously noted left supraclavicular and thoracic inlet nodes appear diminished. AP window soft tissue density measures 2.2 by 2.2 cm, previously 2.7 x 2.5 cm when measured in similar fashion. Similar mild soft tissue thickening at the left hilus. No new suspicious mediastinal lymph nodes. Esophagus within normal limits. Lungs/Pleura: Small left pleural effusion with mild rim enhancement. Ovoid consolidation with calcification and curvilinear density at the posterior left lung base, possibly representing round atelectasis. Resolution of previously noted right middle lobe ground-glass nodule. Diminished posterior right upper lobe pulmonary nodule without measurable nodule on this exam. Left upper lobe bronchial thickening and streaky densities most likely pulmonary scarring. Previously measured left apical ground-glass nodule is also diminished without discretely measurable lung nodule on today's exam Upper Abdomen: No acute finding Musculoskeletal: Stable sclerotic lesion within the posterior T10 vertebral body. No new lesions. Review of the MIP images confirms the above findings. IMPRESSION: 1. Negative for acute pulmonary embolus. 2. Small left pleural effusion with mild rim enhancement, appears chronic. Ovoid consolidation with calcification and curvilinear density at the posterior left  lung base, possibly representing round atelectasis. 3. Diminished size of AP window soft tissue density and diminished size of left supraclavicular and thoracic inlet nodes compared to most recent prior CT. 4. Resolution of previously noted right middle lobe ground-glass nodule. Diminished size of previously measured right upper lobe and left apical ground-glass nodules without discretely measurable nodule on today's exam. 5. Stable sclerotic lesion within the posterior T10 vertebral body unchanged. No new suspicious bone lesions 6. Aortic atherosclerosis. Aortic Atherosclerosis (ICD10-I70.0). Electronically Signed   By: Jasmine Pang M.D.   On: 09/16/2023 16:59   DG Chest Portable 1 View  Result Date: 09/16/2023 CLINICAL DATA:  Dyspnea short of breath EXAM: PORTABLE CHEST 1 VIEW COMPARISON:  04/06/2023, 09/21/2019 FINDINGS: Low lung volumes. Mild cardiomegaly. Probable small left-sided pleural effusion. Chronic streaky left perihilar opacities. IMPRESSION: Low lung volumes  with mild cardiomegaly and probable small left effusion. Electronically Signed   By: Jasmine Pang M.D.   On: 09/16/2023 15:32    Microbiology: No results found for this or any previous visit (from the past 240 hour(s)).   Labs: Basic Metabolic Panel: Recent Labs  Lab 09/16/23 1234 09/16/23 1558 09/17/23 0622 09/18/23 1101  NA 124*  --  129* 132*  K 2.7*  --  3.1* 3.3*  CL 84*  --  93* 97*  CO2 22  --  26 26  GLUCOSE 117*  --  123* 106*  BUN 16  --  12 8  CREATININE 0.98  --  0.80 0.91  CALCIUM 9.5  --  8.8* 9.0  MG  --  1.5*  --  1.7   Liver Function Tests: Recent Labs  Lab 09/16/23 1234 09/18/23 1101  AST 25 17  ALT 23 21  ALKPHOS 88 83  BILITOT 1.2 0.6  PROT 8.5* 7.8  ALBUMIN 4.3 3.8   No results for input(s): "LIPASE", "AMYLASE" in the last 168 hours. No results for input(s): "AMMONIA" in the last 168 hours. CBC: Recent Labs  Lab 09/16/23 1234 09/17/23 0622 09/18/23 1101  WBC 15.3* 12.0* 11.7*   NEUTROABS 8.9*  --   --   HGB 10.8* 9.6* 9.7*  HCT 32.2* 29.0* 30.9*  MCV 85.6 89.0 90.6  PLT 361 309 316   Cardiac Enzymes: No results for input(s): "CKTOTAL", "CKMB", "CKMBINDEX", "TROPONINI" in the last 168 hours. BNP: BNP (last 3 results) Recent Labs    04/06/23 1140 09/16/23 1234  BNP 4.2 17.7    ProBNP (last 3 results) No results for input(s): "PROBNP" in the last 8760 hours.  CBG: No results for input(s): "GLUCAP" in the last 168 hours.     Signed:  Rhetta Mura MD   [facility] 09/18/2023, 12:31 PM

## 2023-09-22 ENCOUNTER — Other Ambulatory Visit: Payer: Self-pay

## 2023-09-23 ENCOUNTER — Other Ambulatory Visit: Payer: Self-pay

## 2023-09-30 ENCOUNTER — Other Ambulatory Visit: Payer: Self-pay | Admitting: Internal Medicine

## 2023-10-01 ENCOUNTER — Telehealth: Payer: Self-pay | Admitting: Internal Medicine

## 2023-10-01 NOTE — Telephone Encounter (Signed)
Called patient regarding October appointments, patient is notified.

## 2023-10-04 ENCOUNTER — Encounter: Payer: Self-pay | Admitting: Internal Medicine

## 2023-10-07 ENCOUNTER — Inpatient Hospital Stay: Payer: MEDICAID | Attending: Internal Medicine | Admitting: Internal Medicine

## 2023-10-07 ENCOUNTER — Other Ambulatory Visit: Payer: Self-pay | Admitting: Medical Oncology

## 2023-10-07 VITALS — BP 110/77 | HR 92 | Temp 97.5°F | Resp 18 | Ht 69.0 in | Wt 273.3 lb

## 2023-10-07 DIAGNOSIS — L03115 Cellulitis of right lower limb: Secondary | ICD-10-CM | POA: Diagnosis not present

## 2023-10-07 DIAGNOSIS — C3492 Malignant neoplasm of unspecified part of left bronchus or lung: Secondary | ICD-10-CM | POA: Diagnosis not present

## 2023-10-07 DIAGNOSIS — L03116 Cellulitis of left lower limb: Secondary | ICD-10-CM | POA: Insufficient documentation

## 2023-10-07 DIAGNOSIS — C3412 Malignant neoplasm of upper lobe, left bronchus or lung: Secondary | ICD-10-CM | POA: Insufficient documentation

## 2023-10-07 NOTE — Progress Notes (Signed)
Willie Schmidt   [phone]  OFFICE PROGRESS NOTE  Patient, No Pcp Per No address on file  DIAGNOSIS: Stage IV (T2b, N3, M1 C) non-small cell lung cancer, adenocarcinoma presented with left upper lobe lung mass in addition to mediastinal and left supraclavicular lymphadenopathy as well as bilateral pulmonary nodules and suspicious bone lesion diagnosed in March 2020.  PRIOR THERAPY: Systemic chemotherapy with carboplatin for AUC of 5, Alimta 500 mg/M2 and Keytruda 200 mg IV every 3 weeks.  First dose March 11, 2019.  Status post 19 cycles.  Starting from cycle #5 the patient is on maintenance treatment with Alimta and Keytruda every 3 weeks.  CURRENT THERAPY: Hospice and palliative care.  INTERVAL HISTORY: Willie Schmidt 62 y.o. male returns to the clinic today for follow-up visit accompanied by his daughter.  His other daughter was available by FaceTime during the visit.  The patient was not seen for close to 3 years now.Discussed the use of AI scribe software for clinical note transcription with the patient, who gave verbal consent to proceed.  History of Present Illness   Willie Schmidt, a 62 year old patient with a history of Stage IV (T2b, N3, M1 C) non-small cell lung cancer, adenocarcinoma presented with left upper lobe lung mass in addition to mediastinal and left supraclavicular lymphadenopathy as well as bilateral pulmonary nodules and suspicious bone lesion diagnosed in March 2020. He has been on hospice care for over three years. The patient's daughter reports that he has been doing well overall, but has recently been dealing with multiple infections. Specifically, he has had cellulitis on his legs and now presents with new skin lesions on his arms and abdomen. Despite these issues, the patient's cancer appears to be under control, with a recent CT scan showing no growth or spread of the disease. The patient has been on a regimen of  Carboplatin, Alimta, and Keytruda, with a total of 19 cycles completed more than 3 years ago discontinued based on the patient and family's request despite good response to the treatment. The patient is also on methadone for pain management. The patient's overall condition has remained stable over the past three years, with some decrease in the size of some of the lesions since the last scan.       MEDICAL HISTORY: Past Medical History:  Diagnosis Date   Bronchitis, chronic (HCC)    Hypertension    metastatic lung ca dx'd 02/2019   lyphadenopathy, bil pulmonary noduels; suspected bone lesions    ALLERGIES:  has No Known Allergies.  MEDICATIONS:  Current Outpatient Medications  Medication Sig Dispense Refill   albuterol (PROVENTIL HFA;VENTOLIN HFA) 108 (90 BASE) MCG/ACT inhaler Inhale 2 puffs into the lungs every 6 (six) hours as needed for wheezing. 1 Inhaler 2   albuterol (PROVENTIL) (2.5 MG/3ML) 0.083% nebulizer solution Take 2.5 mg by nebulization every 6 (six) hours as needed for wheezing or shortness of breath.     Alum Hydroxide-Mag Carbonate (GAVISCON EXTRA STRENGTH) 508-475 MG/10ML SUSP Take 15 mLs by mouth every 4 (four) hours as needed (gas, indigestion).     amLODipine (NORVASC) 5 MG tablet Take 5 mg by mouth daily.     aspirin EC 81 MG tablet Take 1 tablet (81 mg total) by mouth daily. Swallow whole.     camphor-menthol (SARNA) lotion Apply 1 Application topically daily as needed for itching (dry skin).     cyclobenzaprine (FLEXERIL) 10 MG tablet TAKE 1 TABLET BY  MOUTH THREE TIMES DAILY AS NEEDED FOR MUSCLE SPASMS (Patient taking differently: Take 10 mg by mouth 3 (three) times daily as needed for muscle spasms.) 30 tablet 0   diphenhydrAMINE (BENADRYL) 25 mg capsule Take 25 mg by mouth every 6 (six) hours as needed for itching.     diphenhydrAMINE-zinc acetate (BENADRYL) cream Apply topically 2 (two) times daily as needed for itching. 28.4 g 0   doxycycline (VIBRA-TABS) 100 MG  tablet Take 1 tablet (100 mg total) by mouth every 12 (twelve) hours. 4 tablet 0   DULoxetine (CYMBALTA) 30 MG capsule 1 ONCE DAILY FOR 1 WEEK THEN INCREASE TO 1 TWICE DAILY (Patient taking differently: 30 mg 2 (two) times daily. 1 once daily for 1 week then increase to 1 twice daily) 60 capsule 3   furosemide (LASIX) 40 MG tablet Take 0.5 tablets (20 mg total) by mouth 2 (two) times daily. 30 tablet 0   gabapentin (NEURONTIN) 300 MG capsule Take 1 capsule (300 mg total) by mouth 3 (three) times daily. 90 capsule 3   hydrOXYzine (ATARAX) 25 MG tablet Take 25 mg by mouth every 6 (six) hours as needed for itching.     oxyCODONE-acetaminophen (PERCOCET) 10-325 MG tablet Use the tablets that you have at home but instead of a full tablet take 1/2 tablet every 6 as needed for moderate pain Preferentially use Robaxin for muscle spasm in the right neck area     polyethylene glycol (MIRALAX / GLYCOLAX) 17 g packet Take 17 g by mouth daily as needed for mild constipation.     potassium chloride SA (KLOR-CON) 20 MEQ tablet 1 Tablet once daily while taking Lasix (Patient taking differently: Take 20 mEq by mouth daily. While taking Lasix.) 30 tablet 1   predniSONE (DELTASONE) 5 MG tablet Take 5 mg by mouth daily.     sennosides-docusate sodium (SENOKOT-S) 8.6-50 MG tablet Take 2 tablets by mouth 2 (two) times daily.     No current facility-administered medications for this visit.    SURGICAL HISTORY: No past surgical history on file.  REVIEW OF SYSTEMS:  Constitutional: negative Eyes: negative Ears, nose, mouth, throat, and face: negative Respiratory: negative Cardiovascular: negative Gastrointestinal: negative Genitourinary:negative Integument/breast: positive for dryness, rash, and skin color change Hematologic/lymphatic: negative Musculoskeletal:negative Neurological: negative Behavioral/Psych: negative Endocrine: negative Allergic/Immunologic: negative   PHYSICAL EXAMINATION: General  appearance: alert, cooperative, fatigued, and no distress Head: Normocephalic, without obvious abnormality, atraumatic Neck: no adenopathy, no JVD, supple, symmetrical, trachea midline, and thyroid not enlarged, symmetric, no tenderness/mass/nodules Lymph nodes: Cervical, supraclavicular, and axillary nodes normal. Resp: clear to auscultation bilaterally Back: symmetric, no curvature. ROM normal. No CVA tenderness. Cardio: regular rate and rhythm, S1, S2 normal, no murmur, click, rub or gallop GI: soft, non-tender; bowel sounds normal; no masses,  no organomegaly Extremities: edema 1+ edema Neurologic: Alert and oriented X 3, normal strength and tone. Normal symmetric reflexes. Normal coordination and gait  ECOG PERFORMANCE STATUS: 1 - Symptomatic but completely ambulatory  Blood pressure 110/77, pulse 92, temperature (!) 97.5 F (36.4 C), temperature source Temporal, resp. rate 18, height 5\' 9"  (1.753 m), weight 273 lb 4.8 oz (124 kg), SpO2 100%.  LABORATORY DATA: Lab Results  Component Value Date   WBC 11.7 (H) 09/18/2023   HGB 9.7 (L) 09/18/2023   HCT 30.9 (L) 09/18/2023   MCV 90.6 09/18/2023   PLT 316 09/18/2023      Chemistry      Component Value Date/Time   NA 132 (L) 09/18/2023 1101  K 3.3 (L) 09/18/2023 1101   CL 97 (L) 09/18/2023 1101   CO2 26 09/18/2023 1101   BUN 8 09/18/2023 1101   CREATININE 0.91 09/18/2023 1101   CREATININE 0.87 03/22/2020 1006      Component Value Date/Time   CALCIUM 9.0 09/18/2023 1101   ALKPHOS 83 09/18/2023 1101   AST 17 09/18/2023 1101   AST 68 (H) 03/22/2020 1006   ALT 21 09/18/2023 1101   ALT 30 03/22/2020 1006   BILITOT 0.6 09/18/2023 1101   BILITOT 0.5 03/22/2020 1006       RADIOGRAPHIC STUDIES: VAS [place] LOWER EXTREMITY VENOUS (DVT)  Result Date: 09/17/2023  Lower Venous DVT Study Patient Name:  Willie Schmidt  Date of Exam:   09/17/2023 Medical Rec #: [id number]      Accession #:    [id number] Date of Birth: [birth year]      Patient Gender: M Patient Age:   13 years Exam Location:  [facility] Procedure:      VAS [place] LOWER EXTREMITY VENOUS (DVT) Referring Phys: M[facility] --------------------------------------------------------------------------------  Indications: Swelling.  Risk Factors: Cancer. Limitations: Poor ultrasound/tissue interface and body habitus. Comparison Study: No prior studies. Performing Technologist: Chanda Busing RVT  Examination Guidelines: A complete evaluation includes B-mode imaging, spectral Doppler, color Doppler, and power Doppler as needed of all accessible portions of each vessel. Bilateral testing is considered an integral part of a complete examination. Limited examinations for reoccurring indications may be performed as noted. The reflux portion of the exam is performed with the patient in reverse Trendelenburg.  +-----+---------------+---------+-----------+----------+--------------+ RIGHTCompressibilityPhasicitySpontaneityPropertiesThrombus Aging +-----+---------------+---------+-----------+----------+--------------+ CFV  Full           Yes      Yes                                 +-----+---------------+---------+-----------+----------+--------------+   +---------+---------------+---------+-----------+----------+--------------+ LEFT     CompressibilityPhasicitySpontaneityPropertiesThrombus Aging +---------+---------------+---------+-----------+----------+--------------+ CFV      Full           Yes      Yes                                 +---------+---------------+---------+-----------+----------+--------------+ SFJ      Full                                                        +---------+---------------+---------+-----------+----------+--------------+ FV Prox  Full                                                        +---------+---------------+---------+-----------+----------+--------------+ FV Mid   Full                                                         +---------+---------------+---------+-----------+----------+--------------+ FV Distal               Yes  Yes                                 +---------+---------------+---------+-----------+----------+--------------+ PFV      Full                                                        +---------+---------------+---------+-----------+----------+--------------+ POP      Full           Yes      Yes                                 +---------+---------------+---------+-----------+----------+--------------+ PTV      Full                                                        +---------+---------------+---------+-----------+----------+--------------+ PERO     Full                                                        +---------+---------------+---------+-----------+----------+--------------+    Summary: RIGHT: - No evidence of common femoral vein obstruction.   LEFT: - There is no evidence of deep vein thrombosis in the lower extremity. However, portions of this examination were limited- see technologist comments above.  - No cystic structure found in the popliteal fossa.  *See table(s) above for measurements and observations. Electronically signed by Coral Else MD on 09/17/2023 at 10:17:45 PM.    Final    DG Bone Survey Met  Result Date: 09/17/2023 CLINICAL DATA:  Metastatic stage IV lung adenocarcinoma. EXAM: METASTATIC BONE SURVEY COMPARISON:  CT chest 08/27/2023 FINDINGS: No lytic or sclerotic lesion identified on skeletal survey. IMPRESSION: No plain film evidence of skeletal metastasis. Electronically Signed   By: Genevive Bi M.D.   On: 09/17/2023 14:52   CT Angio Chest PE W and/or Wo Contrast  Result Date: 09/16/2023 CLINICAL DATA:  Stage IV lung cancer shortness of breath EXAM: CT ANGIOGRAPHY CHEST WITH CONTRAST TECHNIQUE: Multidetector CT imaging of the chest was performed using the standard protocol during bolus administration of  intravenous contrast. Multiplanar CT image reconstructions and MIPs were obtained to evaluate the vascular anatomy. RADIATION DOSE REDUCTION: This exam was performed according to the departmental dose-optimization program which includes automated exposure control, adjustment of the mA and/or kV according to patient size and/or use of iterative reconstruction technique. CONTRAST:  75mL OMNIPAQUE IOHEXOL 350 MG/ML SOLN COMPARISON:  Chest x-ray 09/16/2023, CT 09/21/2019, CT 03/17/2020, CT 02/14/2019 FINDINGS: Cardiovascular: Satisfactory opacification of the pulmonary arteries to the segmental level. No evidence of pulmonary embolism. Normal heart size. No pericardial effusion. Mild aortic atherosclerosis. Mild coronary vascular calcification Mediastinum/Nodes: Midline trachea. No thyroid mass. Previously noted left supraclavicular and thoracic inlet nodes appear diminished. AP window soft tissue density measures 2.2 by 2.2 cm, previously 2.7 x 2.5 cm when measured in similar fashion. Similar mild soft tissue thickening at  the left hilus. No new suspicious mediastinal lymph nodes. Esophagus within normal limits. Lungs/Pleura: Small left pleural effusion with mild rim enhancement. Ovoid consolidation with calcification and curvilinear density at the posterior left lung base, possibly representing round atelectasis. Resolution of previously noted right middle lobe ground-glass nodule. Diminished posterior right upper lobe pulmonary nodule without measurable nodule on this exam. Left upper lobe bronchial thickening and streaky densities most likely pulmonary scarring. Previously measured left apical ground-glass nodule is also diminished without discretely measurable lung nodule on today's exam Upper Abdomen: No acute finding Musculoskeletal: Stable sclerotic lesion within the posterior T10 vertebral body. No new lesions. Review of the MIP images confirms the above findings. IMPRESSION: 1. Negative for acute pulmonary  embolus. 2. Small left pleural effusion with mild rim enhancement, appears chronic. Ovoid consolidation with calcification and curvilinear density at the posterior left lung base, possibly representing round atelectasis. 3. Diminished size of AP window soft tissue density and diminished size of left supraclavicular and thoracic inlet nodes compared to most recent prior CT. 4. Resolution of previously noted right middle lobe ground-glass nodule. Diminished size of previously measured right upper lobe and left apical ground-glass nodules without discretely measurable nodule on today's exam. 5. Stable sclerotic lesion within the posterior T10 vertebral body unchanged. No new suspicious bone lesions 6. Aortic atherosclerosis. Aortic Atherosclerosis (ICD10-I70.0). Electronically Signed   By: Jasmine Pang M.D.   On: 09/16/2023 16:59   DG Chest Portable 1 View  Result Date: 09/16/2023 CLINICAL DATA:  Dyspnea short of breath EXAM: PORTABLE CHEST 1 VIEW COMPARISON:  04/06/2023, 09/21/2019 FINDINGS: Low lung volumes. Mild cardiomegaly. Probable small left-sided pleural effusion. Chronic streaky left perihilar opacities. IMPRESSION: Low lung volumes with mild cardiomegaly and probable small left effusion. Electronically Signed   By: Jasmine Pang M.D.   On: 09/16/2023 15:32    ASSESSMENT AND PLAN: This is a very pleasant 62 years old African-American male with likely stage IV non-small cell lung cancer, adenocarcinoma presented with large left upper lobe lung mass in addition to mediastinal and left supraclavicular lymphadenopathy as well as suspicious bone metastasis and bilateral pulmonary nodules diagnosed in March 2020. The patient is currently undergoing systemic chemotherapy with carboplatin, Alimta and Keytruda status post 18 cycles.  Starting from cycle #5 he is receiving maintenance treatment with Alimta and Keytruda.  He has been on hospice for more than 3 years.    Stage IV (T2b, N3, M1 C) non-small cell  lung cancer, adenocarcinoma presented with left upper lobe lung mass in addition to mediastinal and left supraclavicular lymphadenopathy as well as bilateral pulmonary nodules and suspicious bone lesion diagnosed in March 2020. Stable disease on hospice for over three years. Recent CT scan showed no growth or spread, with some decrease in size of some lesions. -Continue current hospice care. -Consider transitioning off hospice for regular oncology follow-up with scans every six months if patient's needs change.  Skin Infections Multiple recurrent skin infections, including cellulitis on the legs and possible fungal infection on the abdomen and back. -Recommend consultation with a dermatologist for further evaluation and management. -Consider biopsy of skin lesions to determine etiology.  Pain Management On methadone for pain control. -Continue current pain management regimen under hospice care. -If transitioning off hospice, will need to find alternative pain management, possibly involving rehab due to long-term methadone use.  Follow-up as needed.   The patient was advised to call if he has any other concerning issues in the interval. The patient voices understanding of current  disease status and treatment options and is in agreement with the current care plan. All questions were answered. The patient knows to call the clinic with any problems, questions or concerns. We can certainly see the patient much sooner if necessary. The total time spent in the appointment was 30 minutes.  Disclaimer: This note was dictated with voice recognition software. Similar sounding words can inadvertently be transcribed and may not be corrected upon review.

## 2023-11-26 ENCOUNTER — Encounter (HOSPITAL_COMMUNITY): Payer: Self-pay

## 2023-11-26 ENCOUNTER — Emergency Department (HOSPITAL_COMMUNITY)
Admission: EM | Admit: 2023-11-26 | Discharge: 2023-11-27 | Disposition: A | Discharge status: Home / Self Care | Payer: MEDICAID | Attending: Emergency Medicine | Admitting: Emergency Medicine

## 2023-11-26 ENCOUNTER — Other Ambulatory Visit: Payer: Self-pay

## 2023-11-26 DIAGNOSIS — R21 Rash and other nonspecific skin eruption: Secondary | ICD-10-CM

## 2023-11-26 DIAGNOSIS — L03115 Cellulitis of right lower limb: Secondary | ICD-10-CM | POA: Diagnosis not present

## 2023-11-26 DIAGNOSIS — E876 Hypokalemia: Secondary | ICD-10-CM | POA: Diagnosis not present

## 2023-11-26 DIAGNOSIS — C3492 Malignant neoplasm of unspecified part of left bronchus or lung: Secondary | ICD-10-CM

## 2023-11-26 LAB — CBC WITH DIFFERENTIAL/PLATELET
Abs Immature Granulocytes: 0.11 10*3/uL — ABNORMAL HIGH (ref 0.00–0.07)
Basophils Absolute: 0 10*3/uL (ref 0.0–0.1)
Basophils Relative: 0 %
Eosinophils Absolute: 0.4 10*3/uL (ref 0.0–0.5)
Eosinophils Relative: 2 %
HCT: 31.9 % — ABNORMAL LOW (ref 39.0–52.0)
Hemoglobin: 10.2 g/dL — ABNORMAL LOW (ref 13.0–17.0)
Immature Granulocytes: 1 %
Lymphocytes Relative: 9 %
Lymphs Abs: 1.6 10*3/uL (ref 0.7–4.0)
MCH: 28.6 pg (ref 26.0–34.0)
MCHC: 32 g/dL (ref 30.0–36.0)
MCV: 89.4 fL (ref 80.0–100.0)
Monocytes Absolute: 1.6 10*3/uL — ABNORMAL HIGH (ref 0.1–1.0)
Monocytes Relative: 9 %
Neutro Abs: 13.1 10*3/uL — ABNORMAL HIGH (ref 1.7–7.7)
Neutrophils Relative %: 79 %
Platelets: 403 10*3/uL — ABNORMAL HIGH (ref 150–400)
RBC: 3.57 MIL/uL — ABNORMAL LOW (ref 4.22–5.81)
RDW: 12.8 % (ref 11.5–15.5)
WBC: 16.7 10*3/uL — ABNORMAL HIGH (ref 4.0–10.5)
nRBC: 0 % (ref 0.0–0.2)

## 2023-11-26 LAB — BASIC METABOLIC PANEL
Anion gap: 13 (ref 5–15)
BUN: 23 mg/dL (ref 8–23)
CO2: 29 mmol/L (ref 22–32)
Calcium: 8.6 mg/dL — ABNORMAL LOW (ref 8.9–10.3)
Chloride: 89 mmol/L — ABNORMAL LOW (ref 98–111)
Creatinine, Ser: 0.95 mg/dL (ref 0.61–1.24)
GFR, Estimated: >60 mL/min (ref >60)
Glucose, Bld: 152 mg/dL — ABNORMAL HIGH (ref 70–99)
Potassium: 3.3 mmol/L — ABNORMAL LOW (ref 3.5–5.1)
Sodium: 131 mmol/L — ABNORMAL LOW (ref 135–145)

## 2023-11-26 LAB — COMPREHENSIVE METABOLIC PANEL
ALT: 25 U/L (ref 0–44)
AST: 21 U/L (ref 15–41)
Albumin: 3.9 g/dL (ref 3.5–5.0)
Alkaline Phosphatase: 76 U/L (ref 38–126)
Anion gap: 14 (ref 5–15)
BUN: 27 mg/dL — ABNORMAL HIGH (ref 8–23)
CO2: 30 mmol/L (ref 22–32)
Calcium: 9 mg/dL (ref 8.9–10.3)
Chloride: 86 mmol/L — ABNORMAL LOW (ref 98–111)
Creatinine, Ser: 0.95 mg/dL (ref 0.61–1.24)
GFR, Estimated: >60 mL/min (ref >60)
Glucose, Bld: 102 mg/dL — ABNORMAL HIGH (ref 70–99)
Potassium: 2.6 mmol/L — CL (ref 3.5–5.1)
Sodium: 130 mmol/L — ABNORMAL LOW (ref 135–145)
Total Bilirubin: 0.5 mg/dL (ref <1.2)
Total Protein: 7.6 g/dL (ref 6.5–8.1)

## 2023-11-26 LAB — SEDIMENTATION RATE: Sed Rate: 45 mm/h — ABNORMAL HIGH (ref 0–16)

## 2023-11-26 LAB — MAGNESIUM: Magnesium: 1.4 mg/dL — ABNORMAL LOW (ref 1.7–2.4)

## 2023-11-26 LAB — C-REACTIVE PROTEIN: CRP: 4.5 mg/dL — ABNORMAL HIGH (ref <1.0)

## 2023-11-26 MED ORDER — AMLODIPINE BESYLATE 5 MG PO TABS
5.0000 mg | ORAL_TABLET | Freq: Every morning | ORAL | Status: DC
  Administered 2023-11-27: 5 mg via ORAL
  Filled 2023-11-26: qty 1

## 2023-11-26 MED ORDER — POTASSIUM CHLORIDE 20 MEQ PO PACK
80.0000 meq | PACK | ORAL | Status: AC
  Administered 2023-11-26: 80 meq via ORAL
  Filled 2023-11-26: qty 4

## 2023-11-26 MED ORDER — DIPHENHYDRAMINE HCL 50 MG/ML IJ SOLN
25.0000 mg | Freq: Once | INTRAMUSCULAR | Status: AC
  Administered 2023-11-26: 25 mg via INTRAVENOUS
  Filled 2023-11-26: qty 1

## 2023-11-26 MED ORDER — SENNOSIDES-DOCUSATE SODIUM 8.6-50 MG PO TABS
2.0000 | ORAL_TABLET | Freq: Every day | ORAL | Status: DC
Start: 2023-11-26 — End: 2023-11-27
  Administered 2023-11-26 – 2023-11-27 (×2): 2 via ORAL
  Filled 2023-11-26 (×2): qty 2

## 2023-11-26 MED ORDER — LISINOPRIL-HYDROCHLOROTHIAZIDE 20-25 MG PO TABS: 1.0000 | ORAL_TABLET | Freq: Every morning | ORAL | Status: DC

## 2023-11-26 MED ORDER — DULOXETINE HCL 30 MG PO CPEP
30.0000 mg | ORAL_CAPSULE | Freq: Two times a day (BID) | ORAL | Status: DC
  Administered 2023-11-26 – 2023-11-27 (×2): 30 mg via ORAL
  Filled 2023-11-26 (×2): qty 1

## 2023-11-26 MED ORDER — VANCOMYCIN HCL 500 MG/100ML IV SOLN
500.0000 mg | Freq: Once | INTRAVENOUS | Status: AC
  Administered 2023-11-26: 500 mg via INTRAVENOUS
  Filled 2023-11-26: qty 100

## 2023-11-26 MED ORDER — OXYCODONE HCL 5 MG PO TABS: 5.0000 mg | ORAL_TABLET | Freq: Four times a day (QID) | ORAL | Status: DC | PRN

## 2023-11-26 MED ORDER — OXYCODONE-ACETAMINOPHEN 5-325 MG PO TABS
1.0000 | ORAL_TABLET | Freq: Four times a day (QID) | ORAL | Status: DC | PRN
  Administered 2023-11-26: 1 via ORAL
  Filled 2023-11-26: qty 1

## 2023-11-26 MED ORDER — MAGNESIUM SULFATE 2 GM/50ML IV SOLN
2.0000 g | Freq: Once | INTRAVENOUS | Status: AC
  Administered 2023-11-26: 2 g via INTRAVENOUS
  Filled 2023-11-26: qty 50

## 2023-11-26 MED ORDER — SODIUM CHLORIDE 0.9 % IV SOLN
2.0000 g | Freq: Once | INTRAVENOUS | Status: AC
  Administered 2023-11-26: 2 g via INTRAVENOUS
  Filled 2023-11-26: qty 20

## 2023-11-26 MED ORDER — METHADONE HCL 10 MG PO TABS
5.0000 mg | ORAL_TABLET | Freq: Three times a day (TID) | ORAL | Status: DC
  Administered 2023-11-26 – 2023-11-27 (×2): 5 mg via ORAL
  Filled 2023-11-26 (×2): qty 1

## 2023-11-26 MED ORDER — FLUCONAZOLE 150 MG PO TABS
150.0000 mg | ORAL_TABLET | Freq: Once | ORAL | Status: AC
  Administered 2023-11-26: 150 mg via ORAL
  Filled 2023-11-26: qty 1

## 2023-11-26 MED ORDER — VANCOMYCIN HCL 2000 MG/400ML IV SOLN
2000.0000 mg | Freq: Once | INTRAVENOUS | Status: AC
  Administered 2023-11-26: 2000 mg via INTRAVENOUS
  Filled 2023-11-26: qty 400

## 2023-11-26 MED ORDER — HYDROMORPHONE HCL 1 MG/ML IJ SOLN
1.0000 mg | Freq: Once | INTRAMUSCULAR | Status: AC
  Administered 2023-11-26: 1 mg via INTRAVENOUS
  Filled 2023-11-26: qty 1

## 2023-11-26 MED ORDER — FUROSEMIDE 40 MG PO TABS
20.0000 mg | ORAL_TABLET | Freq: Two times a day (BID) | ORAL | Status: DC
  Administered 2023-11-26 – 2023-11-27 (×2): 20 mg via ORAL
  Filled 2023-11-26 (×2): qty 1

## 2023-11-26 MED ORDER — POLYETHYLENE GLYCOL 3350 17 G PO PACK: 17.0000 g | PACK | Freq: Every day | ORAL | Status: DC | PRN

## 2023-11-26 MED ORDER — DIPHENHYDRAMINE HCL 25 MG PO CAPS
25.0000 mg | ORAL_CAPSULE | Freq: Four times a day (QID) | ORAL | Status: DC | PRN
  Administered 2023-11-26 – 2023-11-27 (×2): 25 mg via ORAL
  Filled 2023-11-26 (×2): qty 1

## 2023-11-26 MED ORDER — ASPIRIN 81 MG PO TBEC
81.0000 mg | DELAYED_RELEASE_TABLET | Freq: Every day | ORAL | Status: DC
  Administered 2023-11-27: 81 mg via ORAL
  Filled 2023-11-26: qty 1

## 2023-11-26 MED ORDER — GABAPENTIN 300 MG PO CAPS
300.0000 mg | ORAL_CAPSULE | Freq: Three times a day (TID) | ORAL | Status: DC
  Administered 2023-11-26 – 2023-11-27 (×2): 300 mg via ORAL
  Filled 2023-11-26 (×2): qty 1

## 2023-11-26 MED ORDER — HYDROCHLOROTHIAZIDE 25 MG PO TABS: 25.0000 mg | ORAL_TABLET | Freq: Every day | ORAL | Status: DC

## 2023-11-26 MED ORDER — LISINOPRIL 10 MG PO TABS: 20.0000 mg | ORAL_TABLET | Freq: Every day | ORAL | Status: DC

## 2023-11-26 MED ORDER — POTASSIUM CHLORIDE 10 MEQ/100ML IV SOLN
10.0000 meq | INTRAVENOUS | Status: AC
  Administered 2023-11-26 (×3): 10 meq via INTRAVENOUS
  Filled 2023-11-26 (×3): qty 100

## 2023-11-26 MED ORDER — METHYLPREDNISOLONE SODIUM SUCC 125 MG IJ SOLR
125.0000 mg | INTRAMUSCULAR | Status: AC
  Administered 2023-11-26: 125 mg via INTRAVENOUS
  Filled 2023-11-26: qty 2

## 2023-11-26 MED ORDER — CYCLOBENZAPRINE HCL 10 MG PO TABS: 10.0000 mg | ORAL_TABLET | Freq: Three times a day (TID) | ORAL | Status: DC | PRN

## 2023-11-26 MED ORDER — POTASSIUM CHLORIDE CRYS ER 20 MEQ PO TBCR
20.0000 meq | EXTENDED_RELEASE_TABLET | Freq: Every day | ORAL | Status: DC
Start: 2023-11-26 — End: 2023-11-27
  Administered 2023-11-26 – 2023-11-27 (×2): 20 meq via ORAL
  Filled 2023-11-26 (×2): qty 1

## 2023-11-26 MED ORDER — FAMOTIDINE IN NACL 20-0.9 MG/50ML-% IV SOLN
20.0000 mg | Freq: Once | INTRAVENOUS | Status: AC
  Administered 2023-11-26: 20 mg via INTRAVENOUS
  Filled 2023-11-26: qty 50

## 2023-11-26 MED ORDER — PREDNISONE 5 MG PO TABS
10.0000 mg | ORAL_TABLET | Freq: Two times a day (BID) | ORAL | Status: DC
  Administered 2023-11-27: 10 mg via ORAL
  Filled 2023-11-26: qty 2

## 2023-11-26 MED ORDER — METOLAZONE 5 MG PO TABS
5.0000 mg | ORAL_TABLET | Freq: Every day | ORAL | Status: DC
  Administered 2023-11-27: 5 mg via ORAL
  Filled 2023-11-26: qty 1

## 2023-11-26 MED ORDER — OXYCODONE-ACETAMINOPHEN 10-325 MG PO TABS: 1.0000 | ORAL_TABLET | Freq: Four times a day (QID) | ORAL | Status: DC | PRN

## 2023-11-26 NOTE — Progress Notes (Signed)
ED Pharmacy Antibiotic Sign Off An antibiotic consult was received from an ED provider for vancomycin per pharmacy dosing for cellulitis. A chart review was completed to assess appropriateness.   The following one time order(s) were placed:  Vancomycin 2000 mg plus vancomycin 500 mg to make total dose = 2500 mg.   Further antibiotic and/or antibiotic pharmacy consults should be ordered by the admitting provider if indicated.   Thank you for allowing pharmacy to be a part of this patient's care.   Herby Abraham, Pharm.D Use secure chat for questions 11/26/2023 2:38 PM Clinical Pharmacist 11/26/23 2:37 PM

## 2023-11-26 NOTE — ED Provider Notes (Signed)
EMERGENCY DEPARTMENT AT [facility] Provider Note   CSN: [id number] Arrival date & time: 11/26/23  1013     History {Add pertinent medical, surgical, social history, OB history to HPI:1} Chief Complaint  Patient presents with   Rash    Willie Schmidt is a 62 y.o. male.  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  Since September 28 when he was discharged from the hospital the patient had a rash.  Initially started on his right lower extremity and then has spread cephalad and is now covering his arms and legs and torso.  Has some small lesions on his face.  No palm or sole involvement.  Does not involve his mucosa at all.  No fevers recently.  Has been through over 8 rounds of treatment for it which have included prednisone tapers, antibiotics (currently on Augmentin), and fungal cream such as ketoconazole.  There was some concern about possible cutaneous T-cell lymphoma but has not had a biopsy or not yet seen dermatology.  The lesion on his right lower extremity is now weeping and is very painful.  Has been trying his home methadone, oxycodone, and Benadryl/Atarax without improvement of his symptoms.  1 point in time was thought to possibly be due to his methadone as well.       Home Medications Prior to Admission medications   Medication Sig Start Date End Date Taking? Authorizing Provider  albuterol (PROVENTIL HFA;VENTOLIN HFA) 108 (90 BASE) MCG/ACT inhaler Inhale 2 puffs into the lungs every 6 (six) hours as needed for wheezing. 02/10/13   Santiago Glad, PA-C  albuterol (PROVENTIL) (2.5 MG/3ML) 0.083% nebulizer solution Take 2.5 mg by nebulization every 6 (six) hours as needed for wheezing or shortness of breath.    [provider]  Alum Hydroxide-Mag Carbonate (GAVISCON EXTRA STRENGTH) [phone] MG/10ML SUSP Take 15 mLs by  mouth every 4 (four) hours as needed (gas, indigestion).    [provider]  amLODipine (NORVASC) 5 MG tablet Take 5 mg by mouth daily. 06/25/19   [provider]  aspirin EC 81 MG tablet Take 1 tablet (81 mg total) by mouth daily. Swallow whole. 09/19/23   Rhetta Mura, MD  camphor-menthol [facility]) lotion Apply 1 Application topically daily as needed for itching (dry skin).    [provider]  cyclobenzaprine (FLEXERIL) 10 MG tablet TAKE 1 TABLET BY MOUTH THREE TIMES DAILY AS NEEDED FOR MUSCLE SPASMS Patient taking differently: Take 10 mg by mouth 3 (three) times daily as needed for muscle spasms. 09/30/19   Si Gaul, MD  diphenhydrAMINE (BENADRYL) 25 mg capsule Take 25 mg by mouth every 6 (six) hours as needed for itching.    [provider]  diphenhydrAMINE-zinc acetate (BENADRYL) cream Apply topically 2 (two) times daily as needed for itching. 09/18/23   Rhetta Mura, MD  DULoxetine (CYMBALTA) 30 MG capsule 1 ONCE DAILY FOR 1 WEEK THEN INCREASE TO 1 TWICE DAILY Patient taking differently: 30 mg 2 (two) times daily. 1 once daily for 1 week then increase to 1 twice daily 10/25/19   Si Gaul, MD  furosemide (LASIX) 40 MG tablet Take 0.5 tablets (20 mg total) by mouth 2 (two) times daily. 09/18/23   Rhetta Mura, MD  gabapentin (NEURONTIN) 300 MG capsule Take 1 capsule (300 mg total) by mouth 3 (three) times daily. 02/09/20   Si Gaul, MD  hydrOXYzine (ATARAX) 25 MG tablet Take 25 mg by mouth every 6 (six) hours as needed for itching.    [provider]  methadone (DOLOPHINE) 5 MG tablet Take 5 mg by mouth every 8 (eight) hours.    [provider]  oxyCODONE-acetaminophen (PERCOCET) 10-325 MG tablet Use the tablets that you have at home but instead of a full tablet take 1/2 tablet every 6 as needed for moderate pain Preferentially use Robaxin for muscle spasm in the right neck area 09/18/23   Rhetta Mura, MD  polyethylene glycol (MIRALAX / GLYCOLAX) 17 g packet Take 17 g by mouth daily as needed for mild constipation.    [provider]  potassium chloride SA (KLOR-CON) 20 MEQ tablet 1 Tablet once daily while taking Lasix Patient taking differently: Take 20 mEq by mouth daily. While taking Lasix. 10/20/19   Tanner, Kathrin Greathouse., PA-C  predniSONE (DELTASONE) 5 MG tablet Take 5 mg by mouth daily.    [provider]  predniSONE (STERAPRED UNI-PAK 21 TAB) 10 MG (21) TBPK tablet Take 10 mg by mouth See admin instructions. 09/29/23   [provider]  sennosides-docusate sodium (SENOKOT-S) 8.6-50 MG tablet Take 2 tablets by mouth 2 (two) times daily.    [provider]      Allergies    Patient has no known allergies.    Review of Systems   Review of Systems  Physical Exam Updated Vital Signs BP (!) 155/90 (BP Location: Right Arm)   Pulse 99   Temp 97.6 F (36.4 C) (Oral)   Resp 20   Ht 5\' 9"  (1.753 m)   Wt 124 kg   SpO2 95%   BMI 40.37 kg/m  Physical Exam Vitals and nursing note reviewed.  Constitutional:      General: He is not in acute distress.    Appearance: He is well-developed.     Comments: Very uncomfortable appearing  HENT:     Head: Normocephalic and atraumatic.     Right Ear: External ear normal.     Left Ear: External ear normal.     Nose: Nose normal.  Eyes:     Extraocular Movements: Extraocular movements intact.     Conjunctiva/sclera: Conjunctivae normal.     Pupils: Pupils are equal, round, and reactive to light.  Cardiovascular:     Rate and Rhythm: Normal rate and regular rhythm.     Heart sounds: Normal heart sounds.  Pulmonary:     Effort: Pulmonary effort is normal. No respiratory distress.  Musculoskeletal:     Cervical back: Normal range of motion and neck supple.     Right lower leg: No edema.     Left lower leg: No edema.  Skin:    General: Skin is warm and dry.     Findings: Rash (See below) present.   Neurological:     Mental Status: He is alert. Mental status is at baseline.  Psychiatric:        Mood and Affect: Mood normal.        Behavior: Behavior normal.            ED Results / Procedures / Treatments   Labs (all labs ordered are listed, but only abnormal results are displayed) Labs Reviewed - No data to display  EKG None  Radiology No results found.  Procedures Procedures  {Document cardiac monitor, telemetry assessment procedure when appropriate:1}  Medications Ordered in ED Medications - No data to display  ED Course/ Medical  Decision Making/ A&P   {   Click here for ABCD2, HEART and other calculatorsREFRESH Note before signing :1}                              Medical Decision Making Amount and/or Complexity of Data Reviewed Labs: ordered.  Risk Prescription drug management.   ***  {Document critical care time when appropriate:1} {Document review of labs and clinical decision tools ie heart score, Chads2Vasc2 etc:1}  {Document your independent review of radiology images, and any outside records:1} {Document your discussion with family members, caretakers, and with consultants:1} {Document social determinants of health affecting pt's care:1} {Document your decision making why or why not admission, treatments were needed:1} Final Clinical Impression(s) / ED Diagnoses Final diagnoses:  None    Rx / DC Orders ED Discharge Orders     None

## 2023-11-26 NOTE — ED Provider Notes (Addendum)
Patient signed out to me by previous provider. Please refer to their note for full HPI.  Briefly this is a 62 year old male here with worsening rash all over his body.  Workup by previous provider shows hypokalemia and is also concerning for cellulitis.  Plan was to admit the patient, however inpatient hospitalist recommended to consult other facilities that had dermatology.  [facility] and [facility] had no available beds.  [facility] was most recently consulted, dermatology was excepting however they currently do not have any inpatient beds either.  Dermatology put them on the list to see him as an outpatient next week if he does not get an inpatient bed at their facility.  At this time potassium is being replaced, medications have been ordered.  He will continue to board in our facility until hopefully [facility] has a bed become available. If we do not hear from [facility] by tomorrow morning, 11/27/23 the plan will be for reevaluation, re contacting [facility] transfer line and repeat chemistry.  Home medications ordered. If patient wants to leave for outpatient dermatology care and repeat chemistry is stable this could be an option.  Repeat chemistry shows improvement of potassium.  I had a discussion with the patient, offered outpatient f/u with [facility] derm, he is still not able to get home from a debility aspect with the ongoing rash and wants to remain for admission.  On my review there appears to be a central clearing and fungal aspect especially of the torso rash.  Added on a dose of oral fluconazole.  We are still pending a bed at [facility]   Rozelle Logan, DO 11/26/23 1855    Rozelle Logan, DO 11/26/23 2230

## 2023-11-26 NOTE — Consult Note (Signed)
Initial Consultation Note   Patient: Willie Schmidt:[id number] DOB: [birth year] PCP: Patient, No Pcp Per DOA: 11/26/2023 DOS: the patient was seen and examined on 11/26/2023 Primary service: Rondel Baton, MD  Referring physician: Vonita Moss Reason for consult: Skin rash and hypokalemia  Assessment/Plan: Skin rash: Progressive skin rash: suspect fungal infection such as tinea corporis.  Others on differential include lupus, cutaneous manifestation of underlying malignancy or chemo.  I think patient needs skin biopsy for definitive diagnosis.  Primary team waiting on bed at tertiary center with dermatology service.  Can also discussed with general surgery if they can do punch biopsy with outpatient follow-up.  I do not think he can get outpatient dermatology follow-up soon.  Consider holding steroid  Stage IV lung cancer-Per last oncology note, s/p on maintenance chemotherapy and hospice after 18 cycles of systemic chemotherapy.  EDP has notified hospice. -Outpatient follow-up with oncology  Cancer pain -Resume home meds after med rec  Hypokalemia: K2.6. -Received p.o. KCl 80 mill equivalent followed by IV KCl 10 mEq -Follow magnesium -Recheck and replenish as appropriate  Hyponatremia: Mild. -Check urine sodium and osmolality  Bilateral lower extremity venous insufficiency: Doubt cellulitis.  Is not warm to touch.  Mild purulent drainage could be related to either skin rash.  I doubt indication for antibiotics.  Go cytosis likely from steroid use. -Continue home diuretics -Consult wound care -Leg elevation  Morbid obesity Body mass index is 40.37 kg/m.     TRH will sign off at present, please call us again when needed.  HPI: Willie Schmidt is a 62 y.o. male with PMH of stage IV lung cancer on maintenance chemo and hospice, cancer pain, morbid obesity and skin rash presenting with progressive skin rash.  History provided by patient and patient's daughter at bedside.   Poorly noted patient in his right leg when he was in the hospital back in September.  At that time, it was felt to be some reaction to opiate versus eczema.  Rash spread proximally to his legs, torso and shoulder.  Rash is pruritic and painful.  Vance Gather appears as small circular lesion that spreads outward with clear margin.  Has been treated with multiple cycles of antibiotics.  Also tried "topical fungal creams" and p.o. prednisone without improvement.  He has not seen dermatologist yet.  Lesion does not involve oral mucosa, palms or soles.  Denies fever, chills, runny nose, sore throat, chest pain, shortness of breath, nausea, vomiting abdominal pain.  No prior history of such skin rash.  No other family member with similar rash.  Denies new medications other than what has been prescribed for the rash.  Patient also reports right lower leg swelling and drainage.  Denies smoking cigarette, drinking alcohol recreational drug use.  In ED, stable vitals. Na 130.  K2.6.  WBC 16.7 with left shift.  Hgb 10.2.  Platelet 403.  Otherwise, CMP and CBC without significant finding.  CRP 4.5.  ESR 45.  Blood cultures obtained.  Received IV Solu-Medrol, ceftriaxone, vancomycin, p.o. and IV KCl.  Hospitalist service consulted for assistance with medical management while patient is waiting on bed at tertiary centers with dermatology service.  Review of Systems: As mentioned in the history of present illness. All other systems reviewed and are negative. Past Medical History:  Diagnosis Date   Bronchitis, chronic (HCC)    Hypertension    metastatic lung ca dx'd 02/2019   lyphadenopathy, bil pulmonary noduels; suspected bone lesions   History reviewed. No  pertinent surgical history. Social History:  reports that he has quit smoking. His smoking use included cigarettes. He has never used smokeless tobacco. He reports current alcohol use. He reports that he does not use drugs.  No Known Allergies  Family History   Problem Relation Age of Onset   Chronic Renal Failure Mother     Prior to Admission medications   Medication Sig Start Date End Date Taking? Authorizing Provider  albuterol (PROVENTIL HFA;VENTOLIN HFA) 108 (90 BASE) MCG/ACT inhaler Inhale 2 puffs into the lungs every 6 (six) hours as needed for wheezing. 02/10/13   Santiago Glad, PA-C  albuterol (PROVENTIL) (2.5 MG/3ML) 0.083% nebulizer solution Take 2.5 mg by nebulization every 6 (six) hours as needed for wheezing or shortness of breath.    [provider]  Alum Hydroxide-Mag Carbonate (GAVISCON EXTRA STRENGTH) [phone] MG/10ML SUSP Take 15 mLs by mouth every 4 (four) hours as needed (gas, indigestion).    [provider]  amLODipine (NORVASC) 5 MG tablet Take 5 mg by mouth daily. 06/25/19   [provider]  aspirin EC 81 MG tablet Take 1 tablet (81 mg total) by mouth daily. Swallow whole. 09/19/23   Rhetta Mura, MD  camphor-menthol [facility]) lotion Apply 1 Application topically daily as needed for itching (dry skin).    [provider]  cyclobenzaprine (FLEXERIL) 10 MG tablet TAKE 1 TABLET BY MOUTH THREE TIMES DAILY AS NEEDED FOR MUSCLE SPASMS Patient taking differently: Take 10 mg by mouth 3 (three) times daily as needed for muscle spasms. 09/30/19   Si Gaul, MD  diphenhydrAMINE (BENADRYL) 25 mg capsule Take 25 mg by mouth every 6 (six) hours as needed for itching.    [provider]  diphenhydrAMINE-zinc acetate (BENADRYL) cream Apply topically 2 (two) times daily as needed for itching. 09/18/23   Rhetta Mura, MD  DULoxetine (CYMBALTA) 30 MG capsule 1 ONCE DAILY FOR 1 WEEK THEN INCREASE TO 1 TWICE DAILY Patient taking differently: 30 mg 2 (two) times daily. 1 once daily for 1 week then increase to 1 twice daily 10/25/19   Si Gaul, MD  furosemide (LASIX) 40 MG tablet Take 0.5 tablets (20 mg total) by mouth 2 (two) times daily. 09/18/23   Rhetta Mura, MD   gabapentin (NEURONTIN) 300 MG capsule Take 1 capsule (300 mg total) by mouth 3 (three) times daily. 02/09/20   Si Gaul, MD  hydrOXYzine (ATARAX) 25 MG tablet Take 25 mg by mouth every 6 (six) hours as needed for itching.    [provider]  methadone (DOLOPHINE) 5 MG tablet Take 5 mg by mouth every 8 (eight) hours.    [provider]  oxyCODONE-acetaminophen (PERCOCET) 10-325 MG tablet Use the tablets that you have at home but instead of a full tablet take 1/2 tablet every 6 as needed for moderate pain Preferentially use Robaxin for muscle spasm in the right neck area 09/18/23   Rhetta Mura, MD  polyethylene glycol (MIRALAX / GLYCOLAX) 17 g packet Take 17 g by mouth daily as needed for mild constipation.    [provider]  potassium chloride SA (KLOR-CON) 20 MEQ tablet 1 Tablet once daily while taking Lasix Patient taking differently: Take 20 mEq by mouth daily. While taking Lasix. 10/20/19   Tanner, Kathrin Greathouse., PA-C  predniSONE (DELTASONE) 5 MG tablet Take 5 mg by mouth daily.    [provider]  predniSONE (STERAPRED UNI-PAK 21 TAB) 10 MG (21) TBPK tablet Take 10 mg by mouth See admin instructions. 09/29/23  [provider]  sennosides-docusate sodium (SENOKOT-S) 8.6-50 MG tablet Take 2 tablets by mouth 2 (two) times daily.    [provider]    Physical Exam: Vitals:   11/26/23 1022 11/26/23 1030 11/26/23 1335 11/26/23 1435  BP:  (!) 155/90 123/74 123/81  Pulse:  99 95 99  Resp:  20 20 18   Temp:  97.6 F (36.4 C)  98.3 F (36.8 C)  TempSrc:  Oral    SpO2:  95% 96% 98%  Weight: 124 kg     Height: 5\' 9"  (1.753 m)      GENERAL: No apparent distress.  Nontoxic. HEENT: MMM.  Vision and hearing grossly intact.  NECK: Supple.  No apparent JVD.  RESP:  No IWOB.  Fair aeration bilaterally. CVS:  RRR. Heart sounds normal.  ABD/GI/GU: BS+. Abd soft, NTND.  MSK/EXT:   No apparent deformity. Moves extremities. No edema.   SKIN: Widespread circular skin lesions, skin changes in keeping with venous insufficiency in BLE with small purulent drainage from right shin.  No increased warmth to touch. NEURO: Awake and alert. Oriented appropriately.  No apparent focal neuro deficit. PSYCH: Calm. Normal affect.  Data Reviewed:  See HPI   Family Communication: Updated patient's daughter at bedside Primary team communication: Discussed with primary team Thank you very much for involving [place] in the care of your patient.  Author: Almon Hercules, MD 11/26/2023 3:36 PM  For on call review [email or web address].

## 2023-11-26 NOTE — ED Triage Notes (Addendum)
Patient is here for evaluation of increase in severity of rash all over body. States rash has been going on since September, but has become much worse the past 2 weeks. Patient has draining wound to the right lower leg. Has been on 8 rounds of prednisone and antibiotics. Still currently taking Augmentin. Pt reports unable to sleep at night and in severe pain and itching all over. Family concerned cancer is spreading.

## 2023-11-26 NOTE — Plan of Care (Signed)
Med rec completed.  EDP resumed home meds.  Will hold lisinopril and HCTZ in the setting of hypokalemia and hyponatremia.  Patient has received a total of 110 mEq KCl for so far.  Will recheck BMP this evening.  Mg 1.4.  Ordered 2 g IV magnesium sulfate.  Agree with continuing the rest of medications as ordered by EDP.

## 2023-11-26 NOTE — Progress Notes (Signed)
[facility] liaison note     This patient is a current hospice patient with [facility].    Liaison will continue to follow for any discharge planning needs and to coordinate continuation of hospice care.    Please don't hesitate to call with any Hospice related questions or concerns.    Thank you for the opportunity to participate in this patient's care.  Glenna Fellows, BSN, RN, OCN [employer] [phone]

## 2023-11-27 DIAGNOSIS — L0390 Cellulitis, unspecified: Secondary | ICD-10-CM | POA: Insufficient documentation

## 2023-11-27 LAB — BASIC METABOLIC PANEL
Anion gap: 10 (ref 5–15)
BUN: 22 mg/dL (ref 8–23)
CO2: 31 mmol/L (ref 22–32)
Calcium: 8.7 mg/dL — ABNORMAL LOW (ref 8.9–10.3)
Chloride: 91 mmol/L — ABNORMAL LOW (ref 98–111)
Creatinine, Ser: 0.85 mg/dL (ref 0.61–1.24)
GFR, Estimated: >60 mL/min (ref >60)
Glucose, Bld: 126 mg/dL — ABNORMAL HIGH (ref 70–99)
Potassium: 3.4 mmol/L — ABNORMAL LOW (ref 3.5–5.1)
Sodium: 132 mmol/L — ABNORMAL LOW (ref 135–145)

## 2023-11-27 LAB — CBC
HCT: 26.3 % — ABNORMAL LOW (ref 39.0–52.0)
Hemoglobin: 8.7 g/dL — ABNORMAL LOW (ref 13.0–17.0)
MCH: 29.3 pg (ref 26.0–34.0)
MCHC: 33.1 g/dL (ref 30.0–36.0)
MCV: 88.6 fL (ref 80.0–100.0)
Platelets: 351 10*3/uL (ref 150–400)
RBC: 2.97 MIL/uL — ABNORMAL LOW (ref 4.22–5.81)
RDW: 13.2 % (ref 11.5–15.5)
WBC: 15.1 10*3/uL — ABNORMAL HIGH (ref 4.0–10.5)
nRBC: 0 % (ref 0.0–0.2)

## 2023-11-27 LAB — ANTI-DNA ANTIBODY, DOUBLE-STRANDED: ds DNA Ab: 1 [IU]/mL (ref 0–9)

## 2023-11-27 LAB — RPR: RPR Ser Ql: NONREACTIVE

## 2023-11-27 LAB — MAGNESIUM: Magnesium: 3 mg/dL — ABNORMAL HIGH (ref 1.7–2.4)

## 2023-11-27 MED ORDER — CEFPODOXIME PROXETIL 100 MG PO TABS: 100.0000 mg | ORAL_TABLET | Freq: Two times a day (BID) | ORAL | 0 refills | Status: AC

## 2023-11-27 MED ORDER — HYDROXYZINE HCL 25 MG PO TABS
50.0000 mg | ORAL_TABLET | Freq: Once | ORAL | Status: DC
  Filled 2023-11-27: qty 2

## 2023-11-27 MED ORDER — HYDROXYZINE HCL 25 MG PO TABS: 25.0000 mg | ORAL_TABLET | Freq: Four times a day (QID) | ORAL | 0 refills | Status: DC

## 2023-11-27 MED ORDER — PREDNISONE 10 MG PO TABS: 20.0000 mg | ORAL_TABLET | Freq: Every day | ORAL | 0 refills | Status: DC

## 2023-11-27 NOTE — Discharge Instructions (Signed)
I am sorry it has taken so long to get transferred to [facility].  Please talk with your oncologist and your hospice providers and see if they can get you a urgent dermatology appointment.  I have restarted you on antibiotics and steroids.

## 2023-11-27 NOTE — ED Provider Notes (Signed)
Emergency Medicine Observation Re-evaluation Note  Willie Schmidt is a 62 y.o. male, seen on rounds today.  Pt initially presented to the ED for complaints of Rash Currently, the patient is resting comfortably.  Physical Exam  BP 133/75 (BP Location: Right Arm)   Pulse (!) 101   Temp 98.3 F (36.8 C) (Oral)   Resp 20   Ht 5\' 9"  (1.753 m)   Wt 124 kg   SpO2 93%   BMI 40.37 kg/m  Physical Exam General: NAD Cardiac: good peripheral perfusion Lungs: bilateral chest rise Psych: NAD  ED Course / MDM  EKG:   I have reviewed the labs performed to date as well as medications administered while in observation.  Recent changes in the last 24 hours include patient presented with a worsening rash.  He was planned to be transferred to [facility].  Apparently they do not have any beds and he is currently on a wait list. Expect call about 10am  Plan    I discussed the possible transfer with [facility] transfer line again this morning.  They did not sound optimistic so they had 20 borders in the ER and were not sure that he was going to get a bed today.  I discussed this with the patient and family who are a bit upset.  It sounds like this has been a progressive issue for him.  Has tried multiple rounds of antibiotics.  He has also been on different steroids.  He has not had a biopsy and has not seen a dermatologist.  I discussed different options including try to get him admitted again to the hospital here.  For the family would like to try and go home.  I will continue him on antibiotics and steroids.  I encouraged them to try and follow-up with their oncologist and hospice providers.      Melene Plan, DO 11/27/23 1024

## 2023-11-27 NOTE — ED Notes (Signed)
Pt. Alert x 4. Ambulatory. Left ER with daughter before receiving discharge paperwork.

## 2023-11-28 NOTE — Discharge Summary (Signed)
 ------------------------------------------------------------------------------- Attestation signed by Laneta Shawnee Atlee Rudy, MD at 12/15/2023 12:01 AM I saw and evaluated the patient and reviewed the resident's note. I agree with the resident's findings and plan. Total time spent with patient was 25 minutes.  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 Briefly, 61 year old male w/ a PMHx of Stage IV (T2b, N3, M1C) non-small cell lung cancer, adenocarcinoma w/ L upper lobe lung mass in addition to mediastinal and L supraclavicular lymphadenopathy as well as bilateral pulmonary nodules and suspicious bone lesion diagnosed in march 2020, hypertension, and schizophrenia who presented with concern of diffuse whole body rash. Hospital course will be addressed in a problem based format as below.   #Concern for cellulitis #Diffuse whole body rash  Patient presented to the ED for progressively worsening diffuse whole body rash with associated pruritus and pain.  Physical exam was notable for rash on RLE concerning for cellulitis with multiple circular lesions throughout the body.  Given concern for cellulitis, patient was initially started on IV vancomycin .  Additionally, dermatology was consulted and performed a skin biopsy that had not yet resulted by time of  discharge.  Antibiotics were ultimately de-escalated to p.o. doxycycline  for a 10-day course prior to discharge.  Patient was treated symptomatically for significant pruritus with medications optimized prior to discharge.  Patient will follow-up with dermatology in the outpatient setting to follow-up skin biopsy.    On day of discharge, patient is clinically stable with no new examination findings or acute symptoms compared to prior.  The patient was seen by the attending physician on the date of discharge and deemed stable and acceptable for discharge.  The patient's chronic medical conditions were treated accordingly per the patient's home medication regimen.  The patient's medication reconciliation, follow-up appointments, discharge orders, instructions, and significant lab and diagnostic studies are as noted.    Discharge Follow-up Action Items: Follow up visits: PCP in 1-2 weeks Follow up with dermatology as scheduled Follow up with palliative care as scheduled Medication changes: START doxepin 10mg  TID START pepcid  20mg  BID START doxycycline  100mg  BID (EOT 12/16) START loratidine 10mg  daily  START kenalog BID START calamine-zinc  oxide BID prn for itching START atarax  50mg  q6h prn  START oxycodone  15mg  q4h prn for moderate (4-6) or severe (7-10) pain  STOP augmentin  STOP percocet     Patient's Ordered Code Status: Full Code  Consults: IP CONSULT TO DERMATOLOGY IP CONSULT TO PALLIATIVE CARE  Significant Diagnostic Studies:  CBC:  Results from last 7 days  Lab Units 12/01/23 0652 11/30/23 0647 11/29/23 0631 11/28/23 0458  WHITE BLOOD CELL COUNT 10*3/uL 12.80* 14.90* 13.80* 14.30*  HEMOGLOBIN g/dL 9.3* 89.6* 88.4* 9.4*  HEMATOCRIT % 29.9* 31.8* 36.2* 28.3*  PLATELET COUNT 10*3/uL 375 420 471* 431  , DIFF:  Results from last 7 days  Lab Units 12/01/23 0652 11/30/23 0647 11/29/23 0631 11/28/23 0458 11/27/23 1744  NEUTROPHILS RELATIVE PERCENT % 71 69 59 64 73   LYMPHOCYTES RELATIVE PERCENT % 16 14 21  20  14  MONOCYTES RELATIVE PERCENT % 11 13 14 12 13   BASOPHILS RELATIVE PERCENT % 0 0 0 0 0  EOSINOPHILS RELATIVE PERCENT % 1 4 5 3  0  NRBC % % 0 0 1 0 0  NEUTROPHILS ABSOLUTE COUNT 10*3/uL 9.10* 10.30* 8.10* 9.20* 12.90*  LYMPHOCYTES ABSOLUTE COUNT 10*3/uL 2.10 2.10 2.90 2.90 2.50  MONOCYTES ABSOLUTE COUNT 10*3/uL 1.40* 1.90* 2.00* 1.70* 2.40*  BASOPHILS ABSOLUTE COUNT 10*3/uL 0.00 0.00 0.10 0.10 0.00  EOSINOPHILS ABSOLUTE COUNT 10*3/uL 0.20 0.50 0.70* 0.40 0.00  , CMP:   Results from last 7 days  Lab Units 12/01/23 0652 11/30/23 0647 11/29/23 0631 11/28/23 0458 11/27/23 1744  SODIUM mmol/L 135* 136 136 135* 131*  POTASSIUM mmol/L 3.5 3.4* 3.8 3.5 3.6  CHLORIDE mmol/L 95* 92* 89* 90* 88*  CO2 mmol/L 31 35* 32* 36* 34*  BUN mg/dL 16 20 21 15 20   CREATININE mg/dL 9.13 9.06 9.06 9.16 9.06  CALCIUM mg/dL 9.0 9.1 8.9 9.0 9.2  BILIRUBIN TOTAL mg/dL  --   --   --   --  0.4  AST U/L  --   --   --   --  15  ALT U/L  --   --   --   --  21  TOTAL PROTEIN g/dL  --   --   --   --  7.0  ALBUMIN g/dL  --   --   --   --  4.2  ANION GAP mmol/L 9 9 15* 9 9  , Coags:   Results from last 7 days  Lab Units 12/01/23 0652 11/30/23 0647 11/29/23 0631 11/28/23 0458  INR  1.0 1.1 1.0 1.1    Disposition: home  Patient Instructions:     Medication List     START taking these medications    calamine-zinc  oxide 8-8 % Lotn Apply topically 2 (two) times a day as needed for itching.   doxepin 10 mg capsule Commonly known as: SINEquan Take 1 capsule (10 mg total) by mouth 3 (three) times a day.   doxycycline  100 mg tablet Commonly known as: VIBRA -TABS Take 1 tablet (100 mg total) by mouth 2 (two) times a day for 13 doses. Take with 8 oz water. Do not lie down for at least 30 minutes after.   loratadine 10 mg tablet Commonly known as: CLARITIN Take 1 tablet (10 mg total) by mouth daily.   triamcinolone acetonide 0.1 % cream Commonly known as:  KENALOG Apply topically 2 (two) times a day. Apply to whole body rash       CHANGE how you take these medications    hydrOXYzine  50 mg tablet Commonly known as: ATARAX  Take 1 tablet (50 mg total) by mouth every 6 (six) hours as needed for itching. What changed:  medication strength how much to take       CONTINUE taking these medications    albuterol  HFA 90 mcg/actuation inhaler Commonly known as: PROVENTIL  HFA;VENTOLIN  HFA;PROAIR  HFA Inhale 2 puffs every 6 (six) hours as needed for shortness of breath.   amLODIPine  5 mg tablet Commonly known as: NORVASC  Take 1 tablet by mouth daily.   aspirin  81 mg EC tablet Take 81 mg by mouth daily.   cyclobenzaprine  10 mg tablet Commonly known as: FLEXERIL  Take 1 tablet by mouth in the morning and 1 tablet at noon and 1 tablet in the evening. Take with meals.   DULoxetine  30 mg capsule Commonly known as: CYMBALTA  Take 1 capsule by mouth in the  morning and 1 capsule in the evening.   furosemide  40 mg tablet Commonly known as: LASIX  Take 40 mg by mouth 2 (two) times a day.   gabapentin  300 mg capsule Commonly known as: NEURONTIN  Take 1 capsule by mouth in the morning and 1 capsule at noon and 1 capsule in the evening. Take with meals.   ketoconazole 2 % shampoo Commonly known as: NIZORAL Apply 1 Application topically. As directed when showering   lisinopriL -hydrochlorothiazide  20-25 mg per tablet Commonly known as: PRINZIDE  Take 1 tablet by mouth every morning.   methadone  10 mg tablet Commonly known as: DOLOPHINE  Take 1 tablet by mouth in the morning and 1 tablet at noon and 1 tablet in the evening. Take with meals.   metOLazone  5 mg tablet Commonly known as: ZAROXOLYN  Take 5 mg by mouth every morning.   potassium chloride  20 mEq ER tablet Commonly known as: KLOR-CON  Take 1 tablet by mouth daily.   predniSONE  10 mg tablet Commonly known as: DELTASONE  Take 1 tablet by mouth daily.   sennosides-docusate sodium   8.6-50 mg per tablet Commonly known as: PERICOLACE Take 2 tablets by mouth daily.       STOP taking these medications    amoxicillin-pot clavulanate 875-125 mg per tablet Commonly known as: AUGMENTIN   Anti-Itch(diphenhyd) with Zinc  2-0.1 % cream Generic drug: diphenhydramine -zinc  acetate   oxyCODONE -acetaminophen  10-325 mg per tablet Commonly known as: PERCOCET         Where to Get Your Medications     These medications were sent to [facility] [address] [place] [address]    Hours: Open Monday 12am to Friday 11:59pm; Sat-Sun: Closed; Holidays: Closed Thanksgiving Phone: [phone]  calamine-zinc  oxide 8-8 % Lotn doxepin 10 mg capsule doxycycline  100 mg tablet hydrOXYzine  50 mg tablet loratadine 10 mg tablet triamcinolone acetonide 0.1 % cream      Follow-up  No future appointments.

## 2023-12-01 ENCOUNTER — Telehealth: Payer: Self-pay | Admitting: Medical Oncology

## 2023-12-01 DIAGNOSIS — Z515 Encounter for palliative care: Secondary | ICD-10-CM | POA: Insufficient documentation

## 2023-12-01 LAB — CULTURE, BLOOD (ROUTINE X 2)
Culture: NO GROWTH
Culture: NO GROWTH
Special Requests: ADEQUATE
Special Requests: ADEQUATE

## 2023-12-01 NOTE — Telephone Encounter (Signed)
FYI   Pt is inpt at [facility] getting  his skin rash worked up. Derm following for diffuse cellulitis. Oncology does not  think it is Mycosis Fungoides. I told Morrie Sheldon that pt is under hospice care x 3 years. She wanted to update [facility] since he saw pt in October. I told Morrie Sheldon that [facility] scans looked good and if MF , taht he may need to see [facility] oncology.

## 2024-02-05 ENCOUNTER — Telehealth: Payer: Self-pay | Admitting: Medical Oncology

## 2024-02-05 NOTE — Telephone Encounter (Addendum)
Dtr wants appt with [facility] . 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 Methotrexate 3 pills weekly .for several months  He has been released from hospice.  Schedule message sent for an appt.

## 2024-02-16 DIAGNOSIS — C801 Malignant (primary) neoplasm, unspecified: Secondary | ICD-10-CM | POA: Insufficient documentation

## 2024-02-16 DIAGNOSIS — C3492 Malignant neoplasm of unspecified part of left bronchus or lung: Secondary | ICD-10-CM | POA: Insufficient documentation

## 2024-02-23 ENCOUNTER — Other Ambulatory Visit: Payer: MEDICAID

## 2024-02-23 ENCOUNTER — Ambulatory Visit: Payer: MEDICAID | Admitting: Internal Medicine

## 2024-04-10 ENCOUNTER — Emergency Department (HOSPITAL_COMMUNITY): Payer: MEDICAID

## 2024-04-10 ENCOUNTER — Other Ambulatory Visit: Payer: Self-pay

## 2024-04-10 ENCOUNTER — Emergency Department (HOSPITAL_COMMUNITY)
Admission: EM | Admit: 2024-04-10 | Discharge: 2024-04-10 | Disposition: A | Discharge status: Home / Self Care | Payer: MEDICAID | Attending: Emergency Medicine | Admitting: Emergency Medicine

## 2024-04-10 DIAGNOSIS — Z7952 Long term (current) use of systemic steroids: Secondary | ICD-10-CM | POA: Diagnosis not present

## 2024-04-10 DIAGNOSIS — Z7982 Long term (current) use of aspirin: Secondary | ICD-10-CM | POA: Diagnosis not present

## 2024-04-10 DIAGNOSIS — J449 Chronic obstructive pulmonary disease, unspecified: Secondary | ICD-10-CM | POA: Insufficient documentation

## 2024-04-10 DIAGNOSIS — I10 Essential (primary) hypertension: Secondary | ICD-10-CM | POA: Insufficient documentation

## 2024-04-10 DIAGNOSIS — Z85118 Personal history of other malignant neoplasm of bronchus and lung: Secondary | ICD-10-CM | POA: Diagnosis not present

## 2024-04-10 DIAGNOSIS — Z79899 Other long term (current) drug therapy: Secondary | ICD-10-CM | POA: Insufficient documentation

## 2024-04-10 DIAGNOSIS — R109 Unspecified abdominal pain: Secondary | ICD-10-CM | POA: Diagnosis present

## 2024-04-10 LAB — I-STAT CHEM 8, ED
BUN: 11 mg/dL (ref 8–23)
Calcium, Ion: 1.19 mmol/L (ref 1.15–1.40)
Chloride: 97 mmol/L — ABNORMAL LOW (ref 98–111)
Creatinine, Ser: 0.8 mg/dL (ref 0.61–1.24)
Glucose, Bld: 103 mg/dL — ABNORMAL HIGH (ref 70–99)
HCT: 41 % (ref 39.0–52.0)
Hemoglobin: 13.9 g/dL (ref 13.0–17.0)
Potassium: 3.8 mmol/L (ref 3.5–5.1)
Sodium: 135 mmol/L (ref 135–145)
TCO2: 26 mmol/L (ref 22–32)

## 2024-04-10 LAB — COMPREHENSIVE METABOLIC PANEL WITH GFR
ALT: 13 U/L (ref 0–44)
AST: 16 U/L (ref 15–41)
Albumin: 4.3 g/dL (ref 3.5–5.0)
Alkaline Phosphatase: 159 U/L — ABNORMAL HIGH (ref 38–126)
Anion gap: 10 (ref 5–15)
BUN: 13 mg/dL (ref 8–23)
CO2: 26 mmol/L (ref 22–32)
Calcium: 9.4 mg/dL (ref 8.9–10.3)
Chloride: 97 mmol/L — ABNORMAL LOW (ref 98–111)
Creatinine, Ser: 0.63 mg/dL (ref 0.61–1.24)
GFR, Estimated: >60 mL/min (ref >60)
Glucose, Bld: 106 mg/dL — ABNORMAL HIGH (ref 70–99)
Potassium: 3.8 mmol/L (ref 3.5–5.1)
Sodium: 133 mmol/L — ABNORMAL LOW (ref 135–145)
Total Bilirubin: 0.8 mg/dL (ref 0.0–1.2)
Total Protein: 8.8 g/dL — ABNORMAL HIGH (ref 6.5–8.1)

## 2024-04-10 LAB — CBC
HCT: 38.6 % — ABNORMAL LOW (ref 39.0–52.0)
Hemoglobin: 12 g/dL — ABNORMAL LOW (ref 13.0–17.0)
MCH: 28.6 pg (ref 26.0–34.0)
MCHC: 31.1 g/dL (ref 30.0–36.0)
MCV: 91.9 fL (ref 80.0–100.0)
Platelets: 354 10*3/uL (ref 150–400)
RBC: 4.2 MIL/uL — ABNORMAL LOW (ref 4.22–5.81)
RDW: 13.3 % (ref 11.5–15.5)
WBC: 10.8 10*3/uL — ABNORMAL HIGH (ref 4.0–10.5)
nRBC: 0 % (ref 0.0–0.2)

## 2024-04-10 LAB — URINALYSIS, W/ REFLEX TO CULTURE (INFECTION SUSPECTED)
Bacteria, UA: NONE SEEN
Bilirubin Urine: NEGATIVE
Glucose, UA: NEGATIVE mg/dL
Hgb urine dipstick: NEGATIVE
Ketones, ur: NEGATIVE mg/dL
Leukocytes,Ua: NEGATIVE
Nitrite: NEGATIVE
Protein, ur: NEGATIVE mg/dL
Specific Gravity, Urine: 1.015 (ref 1.005–1.030)
pH: 6 (ref 5.0–8.0)

## 2024-04-10 LAB — LIPASE, BLOOD: Lipase: 30 U/L (ref 11–51)

## 2024-04-10 MED ORDER — LIDOCAINE 5 % EX PTCH
1.0000 | MEDICATED_PATCH | CUTANEOUS | Status: DC
  Administered 2024-04-10: 1 via TRANSDERMAL
  Filled 2024-04-10: qty 1

## 2024-04-10 MED ORDER — FENTANYL CITRATE PF 50 MCG/ML IJ SOSY
50.0000 ug | PREFILLED_SYRINGE | Freq: Once | INTRAMUSCULAR | Status: AC
  Administered 2024-04-10: 50 ug via INTRAVENOUS
  Filled 2024-04-10: qty 1

## 2024-04-10 MED ORDER — METHOCARBAMOL 500 MG PO TABS: 500.0000 mg | ORAL_TABLET | Freq: Two times a day (BID) | ORAL | 0 refills | Status: DC

## 2024-04-10 MED ORDER — OXYCODONE-ACETAMINOPHEN 5-325 MG PO TABS
1.0000 | ORAL_TABLET | Freq: Once | ORAL | Status: AC
  Administered 2024-04-10: 1 via ORAL
  Filled 2024-04-10: qty 1

## 2024-04-10 MED ORDER — OXYCODONE HCL 5 MG PO TABS: 5.0000 mg | ORAL_TABLET | ORAL | 0 refills | Status: DC | PRN

## 2024-04-10 MED ORDER — IOHEXOL 300 MG/ML  SOLN
100.0000 mL | Freq: Once | INTRAMUSCULAR | Status: AC | PRN
  Administered 2024-04-10: 100 mL via INTRAVENOUS

## 2024-04-10 NOTE — ED Triage Notes (Signed)
 Patient to ED by POV with c/o left side back and flank pain. He states pain started a few days ago. Pain is in left lower back and radiates upward. Denies dysuria. States he has HX of stage four lung cancer.

## 2024-04-10 NOTE — Discharge Instructions (Addendum)

## 2024-04-10 NOTE — ED Notes (Signed)
 Pt transported to CT ?

## 2024-04-10 NOTE — ED Notes (Signed)
 Pt ambulated to restroom.

## 2024-04-10 NOTE — ED Provider Notes (Signed)
 [place] EMERGENCY DEPARTMENT AT [facility] Provider Note   CSN: [id number] Arrival date & time: 04/10/24  1011     History  Chief Complaint  Patient presents with   Back Pain   Flank Pain    IMRI LOR is a 63 y.o. male with PMHx COPD, HTN, metastatic lung cancer currently on hospice/palliative care who presents to ED concerned for left sided/posterior flank pain x2-3 days. No trauma or falls. Pain does not radiate into testicles. Last dose of pain medicine was tylenol  last night which did not provide any relief. Patient endorses hx of chronic back pain from his bone metastasis which he used to take Percocet for.   Denies fever, chest pain, dyspnea, cough, nausea, vomiting, diarrhea, dysuria, hematuria, hematochezia.    Back Pain Flank Pain       Home Medications Prior to Admission medications   Medication Sig Start Date End Date Taking? Authorizing Provider  methocarbamol  (ROBAXIN ) 500 MG tablet Take 1 tablet (500 mg total) by mouth 2 (two) times daily. 04/10/24  Yes Dorisann Garre F, PA-C  oxyCODONE  (ROXICODONE ) 5 MG immediate release tablet Take 1 tablet (5 mg total) by mouth every 4 (four) hours as needed for up to 10 doses for severe pain (pain score 7-10). 04/10/24  Yes Dorisann Garre F, PA-C  albuterol  (PROVENTIL  HFA;VENTOLIN  HFA) 108 (90 BASE) MCG/ACT inhaler Inhale 2 puffs into the lungs every 6 (six) hours as needed for wheezing. 02/10/13   Letcher Rattler, PA-C  albuterol  (PROVENTIL ) (2.5 MG/3ML) 0.083% nebulizer solution Take 2.5 mg by nebulization every 6 (six) hours as needed for wheezing or shortness of breath.    [provider]  Alum Hydroxide-Mag Carbonate (GAVISCON EXTRA STRENGTH) 508-475 MG/10ML SUSP Take 15 mLs by mouth every 4 (four) hours as needed (gas, indigestion).    [provider]  amLODipine  (NORVASC ) 5 MG tablet Take 5 mg by mouth in the morning. 06/25/19   [provider]  aspirin  EC 81 MG tablet  Take 1 tablet (81 mg total) by mouth daily. Swallow whole. 09/19/23   Samtani, Jai-Gurmukh, MD  camphor-menthol [facility]) lotion Apply 1 Application topically daily as needed for itching (dry skin).    [provider]  diphenhydrAMINE  (BENADRYL ) 25 mg capsule Take 25 mg by mouth every 6 (six) hours as needed for itching.    [provider]  diphenhydrAMINE -zinc  acetate (BENADRYL ) cream Apply topically 2 (two) times daily as needed for itching. 09/18/23   Samtani, Jai-Gurmukh, MD  DULoxetine  (CYMBALTA ) 30 MG capsule 1 ONCE DAILY FOR 1 WEEK THEN INCREASE TO 1 TWICE DAILY Patient taking differently: 30 mg 2 (two) times daily. 1 once daily for 1 week then increase to 1 twice daily 10/25/19   Marlene Simas, MD  furosemide  (LASIX ) 40 MG tablet Take 0.5 tablets (20 mg total) by mouth 2 (two) times daily. 09/18/23   Samtani, Jai-Gurmukh, MD  gabapentin  (NEURONTIN ) 300 MG capsule Take 1 capsule (300 mg total) by mouth 3 (three) times daily. 02/09/20   Marlene Simas, MD  hydrOXYzine  (ATARAX ) 25 MG tablet Take 25 mg by mouth every 6 (six) hours as needed for itching.    [provider]  hydrOXYzine  (ATARAX ) 25 MG tablet Take 1 tablet (25 mg total) by mouth every 6 (six) hours. 11/27/23   Albertus Hughs, DO  lisinopril -hydrochlorothiazide  (ZESTORETIC ) 20-25 MG tablet Take 1 tablet by mouth in the morning. 11/10/23   [provider]  methadone  (DOLOPHINE ) 5 MG tablet Take 5 mg by  mouth every 8 (eight) hours.    [provider]  metolazone  (ZAROXOLYN ) 5 MG tablet Take 5 mg by mouth in the morning. 11/10/23   [provider]  polyethylene glycol (MIRALAX  / GLYCOLAX ) 17 g packet Take 17 g by mouth daily as needed for mild constipation.    [provider]  potassium chloride  SA (KLOR-CON ) 20 MEQ tablet 1 Tablet once daily while taking Lasix  Patient taking differently: Take 20 mEq by mouth daily. While taking Lasix . 10/20/19   Tanner, Van E., PA-C  predniSONE   (DELTASONE ) 10 MG tablet Take 10 mg by mouth 2 (two) times daily with a meal.    [provider]  predniSONE  (DELTASONE ) 10 MG tablet Take 2 tablets (20 mg total) by mouth daily. 11/27/23   Albertus Hughs, DO  sennosides-docusate sodium  (SENOKOT-S) 8.6-50 MG tablet Take 2 tablets by mouth in the morning, at noon, and at bedtime.    [provider]  UNABLE TO FIND Take 2 tablets by mouth daily as needed. Med Name: IVM    [provider]      Allergies    Patient has no known allergies.    Review of Systems   Review of Systems  Genitourinary:  Positive for flank pain.  Musculoskeletal:  Positive for back pain.    Physical Exam Updated Vital Signs BP (!) 145/89 (BP Location: Right Arm)   Pulse (!) 106   Temp (!) 97.4 F (36.3 C) (Oral)   Resp (!) 24   Ht 5\' 9"  (1.753 m)   Wt 120.2 kg   SpO2 99%   BMI 39.13 kg/m  Physical Exam Vitals and nursing note reviewed.  Constitutional:      General: He is not in acute distress.    Appearance: He is not ill-appearing or toxic-appearing.  HENT:     Head: Normocephalic and atraumatic.     Mouth/Throat:     Mouth: Mucous membranes are moist.  Eyes:     General: No scleral icterus.       Right eye: No discharge.        Left eye: No discharge.     Conjunctiva/sclera: Conjunctivae normal.  Cardiovascular:     Rate and Rhythm: Normal rate and regular rhythm.     Pulses: Normal pulses.     Heart sounds: Normal heart sounds. No murmur heard. Pulmonary:     Effort: Pulmonary effort is normal. No respiratory distress.     Breath sounds: Normal breath sounds. No wheezing, rhonchi or rales.  Abdominal:     General: Abdomen is flat. Bowel sounds are normal. There is no distension.     Palpations: Abdomen is soft. There is no mass.     Tenderness: There is no abdominal tenderness.  Musculoskeletal:     Right lower leg: No edema.     Left lower leg: No edema.     Comments: tenderness to palpation of posterior side of  left flank. No overlying bruising, skin changes, or swelling.  Skin:    General: Skin is warm and dry.     Findings: No rash.  Neurological:     General: No focal deficit present.     Mental Status: He is alert and oriented to person, place, and time. Mental status is at baseline.  Psychiatric:        Mood and Affect: Mood normal.        Behavior: Behavior normal.     ED Results / Procedures / Treatments  Labs (all labs ordered are listed, but only abnormal results are displayed) Labs Reviewed  CBC - Abnormal; Notable for the following components:      Result Value   WBC 10.8 (*)    RBC 4.20 (*)    Hemoglobin 12.0 (*)    HCT 38.6 (*)    All other components within normal limits  COMPREHENSIVE METABOLIC PANEL WITH GFR - Abnormal; Notable for the following components:   Sodium 133 (*)    Chloride 97 (*)    Glucose, Bld 106 (*)    Total Protein 8.8 (*)    Alkaline Phosphatase 159 (*)    All other components within normal limits  I-STAT CHEM 8, ED - Abnormal; Notable for the following components:   Chloride 97 (*)    Glucose, Bld 103 (*)    All other components within normal limits  LIPASE, BLOOD  URINALYSIS, W/ REFLEX TO CULTURE (INFECTION SUSPECTED)    EKG None  Radiology CT ABDOMEN PELVIS W CONTRAST Result Date: 04/10/2024 CLINICAL DATA:  Abdominal pain, acute, nonlocalized. Left-sided back and flank pain. EXAM: CT ABDOMEN AND PELVIS WITH CONTRAST TECHNIQUE: Multidetector CT imaging of the abdomen and pelvis was performed using the standard protocol following bolus administration of intravenous contrast. RADIATION DOSE REDUCTION: This exam was performed according to the departmental dose-optimization program which includes automated exposure control, adjustment of the mA and/or kV according to patient size and/or use of iterative reconstruction technique. CONTRAST:  OMNIPAQUE  IOHEXOL  300 MG/ML  SOLN COMPARISON:  03/17/2020. FINDINGS: Lower chest: There is a small  left pleural effusion and a trace right pleural effusion. Atelectasis or scarring is noted in the lower lobes bilaterally. The heart is mildly enlarged and coronary artery calcifications are noted. Hepatobiliary: No focal liver abnormality is seen. No gallstones, gallbladder wall thickening, or biliary dilatation. Pancreas: Unremarkable. No pancreatic ductal dilatation or surrounding inflammatory changes. Spleen: Normal in size without focal abnormality. Adrenals/Urinary Tract: The adrenal glands are within normal limits. The kidneys enhance symmetrically. No renal or ureteral calculus is seen bilaterally. The bladder is unremarkable. Stomach/Bowel: Stomach is within normal limits. Appendix appears normal. No evidence of bowel wall thickening, distention, or inflammatory changes. No free air or pneumatosis is seen. Vascular/Lymphatic: Aortic atherosclerosis. No enlarged abdominal or pelvic lymph nodes. Reproductive: Prostate is unremarkable. Other: No abdominopelvic ascites. There is diastasis of the anterior abdominal wall with a broad-based umbilical hernia. Fat containing inguinal hernias are present bilaterally. Musculoskeletal: Degenerative changes are present in the thoracolumbar spine. Stable sclerosis is present at T10. No acute osseous abnormality. IMPRESSION: 1. No renal or ureteral calculus or obstructive uropathy bilaterally. 2. Small left pleural effusion and trace right pleural effusion with atelectasis or scarring at the lung bases. 3. Aortic atherosclerosis. Electronically Signed   By: Wyvonnia Heimlich M.D.   On: 04/10/2024 15:41    Procedures Procedures    Medications Ordered in ED Medications  lidocaine  (LIDODERM ) 5 % 1 patch (1 patch Transdermal Patch Applied 04/10/24 1700)  fentaNYL  (SUBLIMAZE ) injection 50 mcg (50 mcg Intravenous Given 04/10/24 1101)  iohexol  (OMNIPAQUE ) 300 MG/ML solution 100 mL (100 mLs Intravenous Contrast Given 04/10/24 1142)  fentaNYL  (SUBLIMAZE ) injection 50 mcg (50  mcg Intravenous Given 04/10/24 1453)  oxyCODONE -acetaminophen  (PERCOCET/ROXICET) 5-325 MG per tablet 1 tablet (1 tablet Oral Given 04/10/24 1700)    ED Course/ Medical Decision Making/ A&P  Medical Decision Making Amount and/or Complexity of Data Reviewed Labs: ordered. Radiology: ordered.  Risk Prescription drug management.    This patient presents to the ED for concern of abdominal pain, this involves an extensive number of treatment options, and is a complaint that carries with it a high risk of complications and morbidity.  The differential diagnosis includes gastroenteritis, colitis, small bowel obstruction, appendicitis, cholecystitis, pancreatitis, nephrolithiasis, UTI, pyleonephritis, testicular torsion.   Co morbidities that complicate the patient evaluation  COPD, HTN, metastatic lung cancer currently on hospice and palliative care   Additional history obtained:  Dr. Genita Keys PCP   Problem List / ED Course / Critical interventions / Medication management  Patient presented for left posterior flank pain. Does not remember any specific trauma. Patient stating that they used to take percocet for their chronic back pain given the mets to their spine, but has not had any percocet prescribed recently and has not met with PCP or oncologist recently. Physical exam with tenderness to palpation of left posterior flank. Rest of physical exam reassuring. No anterior abdominal tenderness to  palpation. Patient with slightly tachycardic HR in ED - unsure if this is d/t mild dehydration vs patient's pain vs his albuterol  inhaler that he uses daily for his symptomatic lung cancer. Per chart review it does appear that patient's HR is usually elevated near 100 BPM. Patient HR in the 90's while I was in the room. He also denies any infectious symptoms that would be causing his mild tachycardia. I Ordered, and personally interpreted labs.  UA not concerning for  infection.  Lipase within normal limits.  CMP with mild hyponatremia at 133.  Chloride is also mildly low at 97.  No anion gap.  CBC with mild leukocytosis at 10.8.  There is also mild anemia with hemoglobin at 12.0. ALP is mildly elevated at 159.  Patient with a history of bone metastasis which I believe is the cause for this today as he is not having any RUQ tenderness or other infectious symptoms. I ordered imaging studies including CT Abd/Pelvis with contrast: evaluate for structural/surgical etiology of patients' severe abdominal pain. I independently visualized and interpreted imaging and I agree with the radiologist interpretation of no acute process. There are small pleural effusions which seem to be chronic for patient on past scans and T10 sclerotic lesion which is stable. Shared all results with patient. Answered all questions. Patient requesting refills on his narcotic medicines and also some muscle relaxers which I will prescribe. Patient agrees to follow up with PCP first thing Monday morning. Patient anxiously pacing in ED room ready to go home because his ride is waiting outside of the ED. I have reviewed the patients home medicines and have made adjustments as needed The patient has been appropriately medically screened and/or stabilized in the ED. I have low suspicion for any other emergent medical condition which would require further screening, evaluation or treatment in the ED or require inpatient management. At time of discharge the patient is hemodynamically stable and in no acute distress. I have discussed work-up results and diagnosis with patient and answered all questions. Patient is agreeable with discharge plan. We discussed strict return precautions for returning to the emergency department and they verbalized understanding.     Social Determinants of Health:  Cancer patient on hospice/palliative          Final Clinical Impression(s) / ED Diagnoses Final diagnoses:   Left flank pain    Rx / DC Orders ED Discharge  Orders          Ordered    oxyCODONE  (ROXICODONE ) 5 MG immediate release tablet  Every 4 hours PRN        04/10/24 1644    methocarbamol  (ROBAXIN ) 500 MG tablet  2 times daily        04/10/24 1644              [place], [place] 04/10/24 1828    Burnette Carte, MD 04/13/24 [id number]

## 2024-06-03 ENCOUNTER — Ambulatory Visit
Admission: EM | Admit: 2024-06-03 | Discharge: 2024-06-03 | Disposition: A | Discharge status: Home / Self Care | Payer: MEDICAID

## 2024-06-03 ENCOUNTER — Encounter: Payer: Self-pay | Admitting: Emergency Medicine

## 2024-06-03 DIAGNOSIS — H6121 Impacted cerumen, right ear: Secondary | ICD-10-CM

## 2024-06-03 MED ORDER — CARBAMIDE PEROXIDE 6.5 % OT SOLN
5.0000 [drp] | Freq: Once | OTIC | Status: AC
  Administered 2024-06-03: 5 [drp] via OTIC

## 2024-06-03 NOTE — Discharge Instructions (Signed)
  1. Impacted cerumen of right ear (Primary) - carbamide peroxide (DEBROX) 6.5 % OTIC (EAR) solution 5 drop performed in UC to soften earwax prior to right ear irrigation for cerumen removal. - Ear wax removal performed in UC to remove cerumen from right ear, post irrigation evaluation reveals moderate erythema to ear canal, tympanic membrane pearly gray, no erythema, no bulging, no perforation, no sign of infection.

## 2024-06-03 NOTE — ED Triage Notes (Addendum)
 Pt presents c/o otalgia x 3 weeks in right ear. Pt states he tried to improve sxs with OTC products but the sxs have not improved.

## 2024-06-03 NOTE — ED Provider Notes (Signed)
 [facility]  Note:  This document was prepared using Conservation officer, historic buildings and may include unintentional dictation errors.  MRN: [id number] DOB: Nov 29, 1961  Subjective:   Willie Schmidt is a 63 y.o. male presenting for right ear fullness and pressure x 3 weeks.  Patient has tried several over-the-counter products with no improvement.  Patient denies any past history of cerumen impaction or otalgia.  Patient denies any fever, purulent drainage from ears, ear pain to the left ear or loss of hearing in the left ear.  Patient reports moderate decreased hearing in the right ear.  No current facility-administered medications for this encounter.  Current Outpatient Medications:    cyclobenzaprine  (FLEXERIL ) 10 MG tablet, Take 10 mg by mouth 3 (three) times daily., Disp: , Rfl:    diclofenac Sodium (VOLTAREN) 1 % GEL, Apply 1 g topically., Disp: , Rfl:    doxepin (SINEQUAN) 10 MG capsule, Take 10 mg by mouth 3 (three) times daily., Disp: , Rfl:    doxycycline  (VIBRA -TABS) 100 MG tablet, Take 100 mg by mouth 2 (two) times daily., Disp: , Rfl:    famotidine  (PEPCID ) 20 MG tablet, Take 20 mg by mouth., Disp: , Rfl:    folic acid  (FOLVITE ) 1 MG tablet, Take 1 tablet by mouth daily., Disp: , Rfl:    lisinopril -hydrochlorothiazide  (ZESTORETIC ) 20-12.5 MG tablet, Take 1 tablet by mouth daily., Disp: , Rfl:    loratadine (CLARITIN) 10 MG tablet, Take 10 mg by mouth daily., Disp: , Rfl:    methadone  (DOLOPHINE ) 10 MG tablet, Take 10 mg by mouth every 8 (eight) hours., Disp: , Rfl:    naloxone (NARCAN) nasal spray 4 mg/0.1 mL, Spray the contents of 1 device into 1 nostril. Call 911. May repeat with 2nd device in alternate nostril if no response in 2-3 minutes., Disp: , Rfl:    ondansetron  (ZOFRAN -ODT) 4 MG disintegrating tablet, SMARTSIG:1 Tablet(s) By Mouth Every 8-12 Hours PRN, Disp: , Rfl:    Oxycodone  HCl 10 MG TABS, Take 10 mg by mouth 4 (four) times daily as needed., Disp: , Rfl:     oxyCODONE -acetaminophen  (PERCOCET/ROXICET) 5-325 MG tablet, Take 1 tablet by mouth every 6 (six) hours as needed., Disp: , Rfl:    predniSONE  (DELTASONE ) 5 MG tablet, Take 5 mg by mouth daily., Disp: , Rfl:    silver sulfADIAZINE (SILVADENE) 1 % cream, Apply to the open eroded areas of skin daily as needed, Disp: , Rfl:    albuterol  (PROVENTIL  HFA;VENTOLIN  HFA) 108 (90 BASE) MCG/ACT inhaler, Inhale 2 puffs into the lungs every 6 (six) hours as needed for wheezing., Disp: 1 Inhaler, Rfl: 2   albuterol  (PROVENTIL ) (2.5 MG/3ML) 0.083% nebulizer solution, Take 2.5 mg by nebulization every 6 (six) hours as needed for wheezing or shortness of breath., Disp: , Rfl:    Alum Hydroxide-Mag Carbonate (GAVISCON EXTRA STRENGTH) 508-475 MG/10ML SUSP, Take 15 mLs by mouth every 4 (four) hours as needed (gas, indigestion)., Disp: , Rfl:    amLODipine  (NORVASC ) 5 MG tablet, Take 5 mg by mouth in the morning., Disp: , Rfl:    aspirin  EC 81 MG tablet, Take 1 tablet (81 mg total) by mouth daily. Swallow whole., Disp: , Rfl:    camphor-menthol (SARNA) lotion, Apply 1 Application topically daily as needed for itching (dry skin)., Disp: , Rfl:    diphenhydrAMINE  (BENADRYL ) 25 mg capsule, Take 25 mg by mouth every 6 (six) hours as needed for itching., Disp: , Rfl:    diphenhydrAMINE -zinc  acetate (BENADRYL )  cream, Apply topically 2 (two) times daily as needed for itching., Disp: 28.4 g, Rfl: 0   DULoxetine  (CYMBALTA ) 30 MG capsule, 1 ONCE DAILY FOR 1 WEEK THEN INCREASE TO 1 TWICE DAILY (Patient taking differently: 30 mg 2 (two) times daily. 1 once daily for 1 week then increase to 1 twice daily), Disp: 60 capsule, Rfl: 3   DULoxetine  (CYMBALTA ) 60 MG capsule, Take 60 mg by mouth daily., Disp: , Rfl:    furosemide  (LASIX ) 40 MG tablet, Take 0.5 tablets (20 mg total) by mouth 2 (two) times daily., Disp: 30 tablet, Rfl: 0   gabapentin  (NEURONTIN ) 300 MG capsule, Take 1 capsule (300 mg total) by mouth 3 (three) times daily.,  Disp: 90 capsule, Rfl: 3   hydrOXYzine  (ATARAX ) 25 MG tablet, Take 25 mg by mouth every 6 (six) hours as needed for itching., Disp: , Rfl:    hydrOXYzine  (ATARAX ) 25 MG tablet, Take 1 tablet (25 mg total) by mouth every 6 (six) hours., Disp: 12 tablet, Rfl: 0   lisinopril -hydrochlorothiazide  (ZESTORETIC ) 20-25 MG tablet, Take 1 tablet by mouth in the morning., Disp: , Rfl:    methadone  (DOLOPHINE ) 5 MG tablet, Take 5 mg by mouth every 8 (eight) hours., Disp: , Rfl:    methocarbamol  (ROBAXIN ) 500 MG tablet, Take 1 tablet (500 mg total) by mouth 2 (two) times daily., Disp: 20 tablet, Rfl: 0   methotrexate (RHEUMATREX) 2.5 MG tablet, Take 10 mg by mouth once a week., Disp: , Rfl:    metolazone  (ZAROXOLYN ) 5 MG tablet, Take 5 mg by mouth in the morning., Disp: , Rfl:    oxyCODONE  (ROXICODONE ) 5 MG immediate release tablet, Take 1 tablet (5 mg total) by mouth every 4 (four) hours as needed for up to 10 doses for severe pain (pain score 7-10)., Disp: 10 tablet, Rfl: 0   polyethylene glycol (MIRALAX  / GLYCOLAX ) 17 g packet, Take 17 g by mouth daily as needed for mild constipation., Disp: , Rfl:    potassium chloride  SA (KLOR-CON ) 20 MEQ tablet, 1 Tablet once daily while taking Lasix  (Patient taking differently: Take 20 mEq by mouth daily. While taking Lasix .), Disp: 30 tablet, Rfl: 1   predniSONE  (DELTASONE ) 10 MG tablet, Take 10 mg by mouth 2 (two) times daily with a meal., Disp: , Rfl:    predniSONE  (DELTASONE ) 10 MG tablet, Take 2 tablets (20 mg total) by mouth daily., Disp: 15 tablet, Rfl: 0   senna (SENOKOT) 8.6 MG TABS tablet, Take 2 tablets by mouth daily., Disp: , Rfl:    sennosides-docusate sodium  (SENOKOT-S) 8.6-50 MG tablet, Take 2 tablets by mouth in the morning, at noon, and at bedtime., Disp: , Rfl:    triamcinolone ointment (KENALOG) 0.1 %, SMARTSIG:Topical 1-2 Times Daily PRN, Disp: , Rfl:    UNABLE TO FIND, Take 2 tablets by mouth daily as needed. Med Name: IVM, Disp: , Rfl:    No Known  Allergies  Past Medical History:  Diagnosis Date   Bronchitis, chronic (HCC)    Hypertension    metastatic lung ca dx'd 02/2019   lyphadenopathy, bil pulmonary noduels; suspected bone lesions     History reviewed. No pertinent surgical history.  Family History  Problem Relation Age of Onset   Chronic Renal Failure Mother     Social History   Tobacco Use   Smoking status: Former    Types: Cigarettes    Passive exposure: Past   Smokeless tobacco: Never  Vaping Use   Vaping status: Never Used  Substance Use Topics   Alcohol use: Yes   Drug use: No    ROS Refer to HPI for ROS details.  Objective:   Vitals: BP 111/70 (BP Location: Left Arm)   Pulse 97   Temp 97.9 F (36.6 C) (Oral)   Resp 18   Wt 264 lb 15.9 oz (120.2 kg)   SpO2 97%   BMI 39.13 kg/m   Physical Exam Vitals and nursing note reviewed.  Constitutional:      General: He is not in acute distress.    Appearance: Normal appearance. He is well-developed. He is not ill-appearing or toxic-appearing.  HENT:     Head: Normocephalic.     Right Ear: Ear canal and external ear normal. Tenderness present. There is impacted cerumen. Tympanic membrane is not injected, erythematous or bulging.     Left Ear: Hearing, tympanic membrane, ear canal and external ear normal. Tympanic membrane is not injected, erythematous or bulging.   Cardiovascular:     Rate and Rhythm: Normal rate.  Pulmonary:     Effort: Pulmonary effort is normal. No respiratory distress.   Skin:    General: Skin is warm and dry.   Neurological:     General: No focal deficit present.     Mental Status: He is alert and oriented to person, place, and time.   Psychiatric:        Mood and Affect: Mood normal.        Behavior: Behavior normal.     Procedures  No results found for this or any previous visit (from the past 24 hours).  No results found.   Assessment and Plan :     Discharge Instructions       1. Impacted cerumen  of right ear (Primary) - carbamide peroxide (DEBROX) 6.5 % OTIC (EAR) solution 5 drop performed in UC to soften earwax prior to right ear irrigation for cerumen removal. - Ear wax removal performed in UC to remove cerumen from right ear, post irrigation evaluation reveals moderate erythema to ear canal, tympanic membrane pearly gray, no erythema, no bulging, no perforation, no sign of infection.       Romell Cavanah B Geanine Vandekamp   Nikyah Lackman, [place] B, [place] 06/03/24 1823

## 2024-11-04 NOTE — Telephone Encounter (Signed)
 Appt rescheduled for 11/12/2024. Daughter confirmed appt time

## 2024-11-07 ENCOUNTER — Encounter (HOSPITAL_BASED_OUTPATIENT_CLINIC_OR_DEPARTMENT_OTHER): Payer: Self-pay

## 2024-11-07 ENCOUNTER — Emergency Department (HOSPITAL_BASED_OUTPATIENT_CLINIC_OR_DEPARTMENT_OTHER): Payer: MEDICAID

## 2024-11-07 ENCOUNTER — Inpatient Hospital Stay (HOSPITAL_BASED_OUTPATIENT_CLINIC_OR_DEPARTMENT_OTHER)
Admission: EM | Admit: 2024-11-07 | Discharge: 2024-11-11 | DRG: 308 | Disposition: A | Discharge status: Home / Self Care | Payer: MEDICAID | Attending: Emergency Medicine | Admitting: Emergency Medicine

## 2024-11-07 ENCOUNTER — Other Ambulatory Visit: Payer: Self-pay

## 2024-11-07 DIAGNOSIS — J9601 Acute respiratory failure with hypoxia: Principal | ICD-10-CM | POA: Diagnosis present

## 2024-11-07 DIAGNOSIS — C3492 Malignant neoplasm of unspecified part of left bronchus or lung: Secondary | ICD-10-CM | POA: Diagnosis present

## 2024-11-07 DIAGNOSIS — D649 Anemia, unspecified: Secondary | ICD-10-CM | POA: Diagnosis present

## 2024-11-07 DIAGNOSIS — E66812 Obesity, class 2: Secondary | ICD-10-CM | POA: Diagnosis present

## 2024-11-07 DIAGNOSIS — I4891 Unspecified atrial fibrillation: Secondary | ICD-10-CM

## 2024-11-07 DIAGNOSIS — J189 Pneumonia, unspecified organism: Secondary | ICD-10-CM | POA: Diagnosis present

## 2024-11-07 DIAGNOSIS — R748 Abnormal levels of other serum enzymes: Secondary | ICD-10-CM | POA: Diagnosis present

## 2024-11-07 LAB — CBC
HCT: 39.7 % (ref 39.0–52.0)
Hemoglobin: 12.8 g/dL — ABNORMAL LOW (ref 13.0–17.0)
MCH: 29.6 pg (ref 26.0–34.0)
MCHC: 32.2 g/dL (ref 30.0–36.0)
MCV: 91.9 fL (ref 80.0–100.0)
Platelets: 256 K/uL (ref 150–400)
RBC: 4.32 MIL/uL (ref 4.22–5.81)
RDW: 13.1 % (ref 11.5–15.5)
WBC: 10.4 K/uL (ref 4.0–10.5)
nRBC: 0 % (ref 0.0–0.2)

## 2024-11-07 LAB — BASIC METABOLIC PANEL WITH GFR
Anion gap: 10 (ref 5–15)
BUN: 11 mg/dL (ref 8–23)
CO2: 27 mmol/L (ref 22–32)
Calcium: 9.6 mg/dL (ref 8.9–10.3)
Chloride: 99 mmol/L (ref 98–111)
Creatinine, Ser: 0.71 mg/dL (ref 0.61–1.24)
GFR, Estimated: >60 mL/min (ref >60)
Glucose, Bld: 104 mg/dL — ABNORMAL HIGH (ref 70–99)
Potassium: 3.7 mmol/L (ref 3.5–5.1)
Sodium: 137 mmol/L (ref 135–145)

## 2024-11-07 LAB — LACTIC ACID, PLASMA: Lactic Acid, Venous: 0.8 mmol/L (ref 0.5–1.9)

## 2024-11-07 LAB — TROPONIN T, HIGH SENSITIVITY
Troponin T High Sensitivity: <15 ng/L (ref 0–19)
Troponin T High Sensitivity: <15 ng/L (ref 0–19)

## 2024-11-07 LAB — PRO BRAIN NATRIURETIC PEPTIDE: Pro Brain Natriuretic Peptide: <50 pg/mL (ref <300.0)

## 2024-11-07 MED ORDER — SODIUM CHLORIDE 0.9 % IV SOLN
2.0000 g | Freq: Once | INTRAVENOUS | Status: AC
  Administered 2024-11-07: 2 g via INTRAVENOUS
  Filled 2024-11-07: qty 20

## 2024-11-07 MED ORDER — LACTATED RINGERS IV BOLUS (SEPSIS)
1000.0000 mL | Freq: Once | INTRAVENOUS | Status: AC
  Administered 2024-11-07: 1000 mL via INTRAVENOUS

## 2024-11-07 MED ORDER — IOHEXOL 350 MG/ML SOLN
75.0000 mL | Freq: Once | INTRAVENOUS | Status: AC | PRN
  Administered 2024-11-07: 75 mL via INTRAVENOUS

## 2024-11-07 MED ORDER — SODIUM CHLORIDE 0.9 % IV SOLN
500.0000 mg | Freq: Once | INTRAVENOUS | Status: AC
  Administered 2024-11-07: 500 mg via INTRAVENOUS
  Filled 2024-11-07: qty 5

## 2024-11-07 MED ORDER — MORPHINE SULFATE (PF) 2 MG/ML IV SOLN
2.0000 mg | Freq: Once | INTRAVENOUS | Status: AC
  Administered 2024-11-07: 2 mg via INTRAVENOUS
  Filled 2024-11-07: qty 1

## 2024-11-07 MED ORDER — LACTATED RINGERS IV SOLN: INTRAVENOUS | Status: AC

## 2024-11-07 NOTE — ED Provider Notes (Signed)
 [place] EMERGENCY DEPARTMENT AT [facility] Provider Note   CSN: [id number] Arrival date & time: 11/07/24  8144     History Chief Complaint  Patient presents with   Chest Pain   Shortness of Breath     HPI: Willie Schmidt is a 63 y.o. 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 Patient arrived via POV accompanied by daughter, Jordan.  History provided by patient and relative: Daughter.  No interpreter required during this encounter.  Patient and daughter present because over the past several days to 1 week patient has had chest tightness, hoarse voice, productive cough.  Patient reports that he feels like he is volume overloaded, reports that he carries fluid in both his lower extremities as well as his lungs.  Denies any fever, chills, abdominal pain, nausea, vomiting, diarrhea.  Reports that he has been adherent with all of his home medications.  Given his symptoms have been progressive, daughter became concerned and decided to bring him to the emergency department.  Patient's recorded medical, surgical, social, medication list and allergies were reviewed in the Snapshot window as part of the initial history.   Prior to Admission medications   Medication Sig Start Date End Date Taking? Authorizing Provider  albuterol  (PROVENTIL  HFA;VENTOLIN  HFA) 108 (90 BASE) MCG/ACT inhaler Inhale 2 puffs into the lungs every 6 (six) hours as needed for wheezing. 02/10/13   Nasario Moats, PA-C  albuterol  (PROVENTIL ) (2.5 MG/3ML) 0.083% nebulizer solution Take 2.5 mg by nebulization every 6 (six) hours as needed for wheezing or shortness of breath.    [provider]  Alum Hydroxide-Mag Carbonate (GAVISCON EXTRA STRENGTH) 508-475 MG/10ML SUSP Take 15 mLs by mouth every 4 (four) hours as needed (gas, indigestion).    [provider]   amLODipine  (NORVASC ) 5 MG tablet Take 5 mg by mouth in the morning. 06/25/19   [provider]  aspirin  EC 81 MG tablet Take 1 tablet (81 mg total) by mouth daily. Swallow whole. 09/19/23   Samtani, Jai-Gurmukh, MD  camphor-menthol [facility]) lotion Apply 1 Application topically daily as needed for itching (dry skin).    [provider]  cyclobenzaprine  (FLEXERIL ) 10 MG tablet Take 10 mg by mouth 3 (three) times daily. 04/20/24   [provider]  diclofenac Sodium (VOLTAREN) 1 % GEL Apply 1 g topically. 03/08/24   [provider]  diphenhydrAMINE  (BENADRYL ) 25 mg capsule Take 25 mg by mouth every 6 (six) hours as needed for itching.    [provider]  diphenhydrAMINE -zinc  acetate (BENADRYL ) cream Apply topically 2 (two) times daily as needed for itching. 09/18/23   Samtani, Jai-Gurmukh, MD  doxepin (SINEQUAN) 10 MG capsule Take 10 mg by mouth 3 (three) times daily. 01/12/24   [provider]  doxycycline  (VIBRA -TABS) 100 MG tablet Take 100 mg by mouth 2 (two) times daily. 01/29/24   [provider]  DULoxetine  (CYMBALTA ) 30 MG capsule 1 ONCE DAILY FOR 1 WEEK THEN INCREASE TO 1 TWICE DAILY Patient taking differently: 30 mg 2 (two) times daily. 1 once daily for 1 week then increase to 1 twice daily 10/25/19   Sherrod Sherrod, MD  DULoxetine  (CYMBALTA ) 60 MG capsule Take 60 mg by mouth daily.    [provider]  famotidine  (PEPCID ) 20 MG tablet Take 20 mg by mouth. 03/08/24 03/08/25  [provider]  folic acid  (FOLVITE ) 1 MG tablet Take 1 tablet  by mouth daily. 02/16/24 02/15/25  [provider]  furosemide  (LASIX ) 40 MG tablet Take 0.5 tablets (20 mg total) by mouth 2 (two) times daily. 09/18/23   Samtani, Jai-Gurmukh, MD  gabapentin  (NEURONTIN ) 300 MG capsule Take 1 capsule (300 mg total) by mouth 3 (three) times daily. 02/09/20   Sherrod Sherrod, MD  hydrOXYzine  (ATARAX ) 25 MG tablet Take 25 mg by mouth every 6 (six) hours  as needed for itching.    [provider]  hydrOXYzine  (ATARAX ) 25 MG tablet Take 1 tablet (25 mg total) by mouth every 6 (six) hours. 11/27/23   Floyd, Dan, DO  lisinopril -hydrochlorothiazide  (ZESTORETIC ) 20-12.5 MG tablet Take 1 tablet by mouth daily. 05/29/24   [provider]  lisinopril -hydrochlorothiazide  (ZESTORETIC ) 20-25 MG tablet Take 1 tablet by mouth in the morning. 11/10/23   [provider]  loratadine (CLARITIN) 10 MG tablet Take 10 mg by mouth daily. 12/29/23   [provider]  methadone  (DOLOPHINE ) 10 MG tablet Take 10 mg by mouth every 8 (eight) hours. 02/23/24   [provider]  methadone  (DOLOPHINE ) 5 MG tablet Take 5 mg by mouth every 8 (eight) hours.    [provider]  methocarbamol  (ROBAXIN ) 500 MG tablet Take 1 tablet (500 mg total) by mouth 2 (two) times daily. 04/10/24   Hoy Nidia FALCON, PA-C  methotrexate (RHEUMATREX) 2.5 MG tablet Take 10 mg by mouth once a week.    [provider]  metolazone  (ZAROXOLYN ) 5 MG tablet Take 5 mg by mouth in the morning. 11/10/23   [provider]  naloxone [facility]) nasal spray 4 mg/0.1 mL Spray the contents of 1 device into 1 nostril. Call 911. May repeat with 2nd device in alternate nostril if no response in 2-3 minutes. 02/16/24   [provider]  ondansetron  (ZOFRAN -ODT) 4 MG disintegrating tablet SMARTSIG:1 Tablet(s) By Mouth Every 8-12 Hours PRN 04/20/24   [provider]  oxyCODONE  (ROXICODONE ) 5 MG immediate release tablet Take 1 tablet (5 mg total) by mouth every 4 (four) hours as needed for up to 10 doses for severe pain (pain score 7-10). 04/10/24   Hoy Nidia FALCON, PA-C  Oxycodone  HCl 10 MG TABS Take 10 mg by mouth 4 (four) times daily as needed. 03/07/24   [provider]  oxyCODONE -acetaminophen  (PERCOCET/ROXICET) 5-325 MG tablet Take 1 tablet by mouth every 6 (six) hours as needed. 05/11/24   [provider]  polyethylene  glycol (MIRALAX  / GLYCOLAX ) 17 g packet Take 17 g by mouth daily as needed for mild constipation.    [provider]  potassium chloride  SA (KLOR-CON ) 20 MEQ tablet 1 Tablet once daily while taking Lasix  Patient taking differently: Take 20 mEq by mouth daily. While taking Lasix . 10/20/19   Tanner, Van E., PA-C  predniSONE  (DELTASONE ) 10 MG tablet Take 10 mg by mouth 2 (two) times daily with a meal.    [provider]  predniSONE  (DELTASONE ) 10 MG tablet Take 2 tablets (20 mg total) by mouth daily. 11/27/23   Emil Share, DO  predniSONE  (DELTASONE ) 5 MG tablet Take 5 mg by mouth daily. 01/06/24   [provider]  senna (SENOKOT) 8.6 MG TABS tablet Take 2 tablets by mouth daily.    [provider]  sennosides-docusate sodium  (SENOKOT-S) 8.6-50 MG tablet Take 2 tablets by mouth in the morning, at noon, and at bedtime.    [provider]  silver sulfADIAZINE (SILVADENE) 1 % cream Apply to the open eroded areas of skin  daily as needed 01/29/24   [provider]  triamcinolone ointment (KENALOG) 0.1 % SMARTSIG:Topical 1-2 Times Daily PRN    [provider]  UNABLE TO FIND Take 2 tablets by mouth daily as needed. Med Name: IVM    [provider]     Allergies: Patient has no known allergies.   Review of Systems   ROS as per HPI  Physical Exam Updated Vital Signs BP 126/77   Pulse 84   Temp 98.6 F (37 C) (Oral)   Resp 18   Ht 5' 9 (1.753 m)   Wt 120.2 kg   SpO2 96%   BMI 39.13 kg/m  Physical Exam Vitals and nursing note reviewed.  Constitutional:      General: He is not in acute distress.    Appearance: He is well-developed.  HENT:     Head: Normocephalic and atraumatic.  Eyes:     Conjunctiva/sclera: Conjunctivae normal.  Cardiovascular:     Rate and Rhythm: Tachycardia present. Rhythm irregular.     Pulses:          Radial pulses are 2+ on the right side and 2+ on the left side.     Heart sounds: No murmur  heard. Pulmonary:     Effort: Pulmonary effort is normal. No respiratory distress.     Breath sounds: Normal breath sounds.  Abdominal:     Palpations: Abdomen is soft.     Tenderness: There is no abdominal tenderness.  Musculoskeletal:        General: No swelling.     Cervical back: Neck supple.     Right lower leg: No tenderness. Edema (2+) present.     Left lower leg: No tenderness. Edema (2+) present.  Skin:    General: Skin is warm and dry.     Capillary Refill: Capillary refill takes less than 2 seconds.  Neurological:     Mental Status: He is alert.  Psychiatric:        Mood and Affect: Mood normal.     ED Course/ Medical Decision Making/ A&P    Procedures Procedures   Medications Ordered in ED Medications  lactated ringers infusion ( Intravenous New Bag/Given 11/08/24 0007)  iohexol  (OMNIPAQUE ) 350 MG/ML injection 75 mL (75 mLs Intravenous Contrast Given 11/07/24 2055)  lactated ringers bolus 1,000 mL (0 mLs Intravenous Stopped 11/08/24 0007)  cefTRIAXone  (ROCEPHIN ) 2 g in sodium chloride  0.9 % 100 mL IVPB (0 g Intravenous Stopped 11/07/24 2331)  azithromycin  (ZITHROMAX ) 500 mg in sodium chloride  0.9 % 250 mL IVPB (0 mg Intravenous Stopped 11/08/24 0006)  morphine  (PF) 2 MG/ML injection 2 mg (2 mg Intravenous Given 11/07/24 2304)    Medical Decision Making:   ISAIHA ASARE is a 63 y.o. male who presents for chest pain and shortness of breath as per above.  Physical exam is pertinent for tachycardia to the low 100s with a regular rhythm.   The differential includes but is not limited to ACS, arrhythmia, pericardial tamponade, pericarditis, myocarditis, pneumonia, pneumothorax, esophageal, tear, perforated abdominal viscous, pulmonary embolism, aortic dissection, costochondritis, musculoskeletal chest wall pain, GERD.  Independent historian: Relative: Daughter  External data reviewed: Labs: reviewed prior labs for baseline  Labs: Ordered, Independent  interpretation, and Details: BMP without AKI, emergent Actra gentian, emergent LFT abnormality.  Lactic acid WNL.  CBC without leukocytosis, mild stable anemia on comparison to prior.  No thrombocytopenia.  Initial and delta troponin undetectable.  BNP WNL.  Radiology: Ordered, Independent interpretation, Details: Chest  x-ray without focal airspace opacification, I do appreciate blunting of the left costophrenic angle, no cardiomediastinal silhouette derangement, no pneumothorax, bony derangement.  CT PE study without large central PE, I do appreciate increased parenchymal opacification in the left lower lobe with adjacent pleural effusion, and All images reviewed independently.  Agree with radiology report at this time.   CT Angio Chest PE W and/or Wo Contrast Result Date: 11/07/2024 CLINICAL DATA:  Worsening shortness of breath and chest pain x1 month. EXAM: CT ANGIOGRAPHY CHEST WITH CONTRAST TECHNIQUE: Multidetector CT imaging of the chest was performed using the standard protocol during bolus administration of intravenous contrast. Multiplanar CT image reconstructions and MIPs were obtained to evaluate the vascular anatomy. RADIATION DOSE REDUCTION: This exam was performed according to the departmental dose-optimization program which includes automated exposure control, adjustment of the mA and/or kV according to patient size and/or use of iterative reconstruction technique. CONTRAST:  75mL OMNIPAQUE  IOHEXOL  350 MG/ML SOLN COMPARISON:  September 16, 2023 FINDINGS: Cardiovascular: There is moderate severity calcification of the aortic arch, without evidence of aortic aneurysm or dissection. Satisfactory opacification of the pulmonary arteries to the segmental level. No evidence of pulmonary embolism. Normal heart size with mild coronary artery calcification. No pericardial effusion. Mediastinum/Nodes: A 2.3 cm x 1.7 cm ill-defined area soft tissue attenuation is seen within the AP window. This measured 2.2  cm x 2.2 cm on the prior study. Stable peribronchial thickening is seen along the left hilum. Thyroid  gland, trachea, and esophagus demonstrate no significant findings. Lungs/Pleura: Stable mild to moderate severity left upper lobe and left lower lobe linear scarring and/or atelectasis is seen with stable left-sided volume loss. A stable area of suspected round atelectasis with calcification is noted within the left lung base. A 3 mm pleural based pulmonary nodule is seen within the posterolateral aspect of the right middle lobe (axial CT image 79, CT series 6). This is not clearly identified on the prior study. There is a small, stable, chronic left pleural effusion. No pneumothorax is identified. Upper Abdomen: No acute abnormality. Musculoskeletal: A stable, ill-defined sclerotic focus is seen within the posterior aspect of the T10 vertebral body. Review of the MIP images confirms the above findings. IMPRESSION: 1. No evidence of pulmonary embolism. 2. Stable left upper lobe and left lower lobe linear scarring and/or atelectasis with a stable area of suspected round atelectasis within the left lung base. 3. 3 mm posterolateral right middle lobe pulmonary nodule. No follow-up needed if patient is low-risk.This recommendation follows the consensus statement: Guidelines for Management of Incidental Pulmonary Nodules Detected on CT Images: From the Fleischner Society 2017; Radiology 2017; 284:228-243. 4. Stable, ill-defined sclerotic focus within the posterior aspect of the T10 vertebral body. 5. Stable small, chronic left pleural effusion. 6. Aortic atherosclerosis. Electronically Signed   By: Suzen Dials M.D.   On: 11/07/2024 21:10   DG Chest Port 1 View Result Date: 11/07/2024 EXAM: 1 VIEW(S) XRAY OF THE CHEST 11/07/2024 07:17:00 PM COMPARISON: 09/16/2023. CLINICAL HISTORY: Chest pain. FINDINGS: LUNGS AND PLEURA: Left base consolidation. Small left pleural effusion. No pneumothorax. HEART AND  MEDIASTINUM: Vascular congestion. Prominent cardiac silhouette. BONES AND SOFT TISSUES: No acute osseous abnormality. IMPRESSION: 1. Left base consolidation and small effusion. 2. Vascular congestion and prominent cardiac silhouette. Electronically signed by: Fonda Field MD 11/07/2024 07:28 PM EST RP Workstation: [id number]    EKG/Medicine tests: Ordered and Independent interpretation EKG Interpretation Date/Time:  Sunday November 07 2024 19:04:32 EST Ventricular Rate:  102 PR Interval:  185  QRS Duration:  98 QT Interval:  371 QTC Calculation: 484 R Axis:   12  Text Interpretation: Sinus tachycardia with irregular rate Abnormal R-wave progression, early transition Borderline T abnormalities, anterior leads Borderline prolonged QT interval Confirmed by Rogelia Satterfield ([id number]) on 11/07/2024 7:29:36 PM  Interventions: Morphine , ceftriaxone , azithromycin , LR bolus  See the EMR for full details regarding lab and imaging results.  The ECG reveals no anatomical ischemia representing STEMI, New-Onset Arrhythmia, or ischemic equivalent.  He has been risk stratified with a HEAR score of 4. Initial troponin is undetectable; delta troponin is undetectable.  The patient's presentation, the patient being hemodynamically stable, and the ECG are not consistent with Pericardial Tamponade. The patient's pain is not positional. This in conjunction with the lack of PR depressions and ST elevations on the ECG are reassuring against Pericarditis. The patient's non-elevated troponin and ECG are also inconsistent with Myocarditis.  There is no evidence of Pneumothorax on physical exam or on the CXR. CXR shows no evidence of Esophageal Tear and there is no recent intractable emesis or esophageal instrumentation. There is no peritonitis or free air on CXR worrisome for a Perforated Abdominal Viscous.  The patient's pain is not tearing and it does not radiate to back. Pulses are present bilaterally in both the  upper and lower extremities. CXR does not show a widened mediastinum. I have a very low suspicion for Aortic Dissection.  Pulmonary Embolism is on the differential.  Given patient's tachycardia, history of metastatic malignancy, and to better evaluate patient's parenchyma, will obtain CT PE study, this does not reveal PE.  Chest x-ray demonstrates consolidation concerning for pneumonia, on CT, radiology feels that it is more consistent with scarring rather than acute pneumonia, however patient with productive cough, and is persistently tachycardic in the emergency department.  Additionally, patient did develop 2 L oxygen requirement while in the ED, therefore do feel that patient requires admission for hypoxic respiratory failure, additionally given patient's symptoms of productive cough and tachycardia, will treat for pneumonia.  Ceftriaxone  and azithromycin  ordered as well as blood cultures.  Hospitalist consulted for admission, discussed with Dr. Charlton who accepted the patient to his service, no additional acute events while patient was under my care.  Presentation is most consistent with acute complicated illness  Discussion of management or test interpretations with external provider(s): Dr. Charlton, hospitalists  Risk Drugs:Prescription drug management and Parenteral controlled substances Treatment: Decision regarding hospitalization  Disposition: ADMIT: I believe the patient requires admission for further care and management. The patient was admitted to hospitalists. Please see inpatient provider note for additional treatment plan details.   MDM generated using voice dictation software and may contain dictation errors.  Please contact me for any clarification or with any questions.  Clinical Impression:  1. Acute respiratory failure with hypoxia (HCC)   2. Pneumonia of left lower lobe due to infectious organism      Admit   Final Clinical Impression(s) / ED Diagnoses Final diagnoses:   Acute respiratory failure with hypoxia (HCC)  Pneumonia of left lower lobe due to infectious organism    Rx / DC Orders ED Discharge Orders     None        Rogelia Satterfield RAMAN, MD 11/08/24 [phone]

## 2024-11-07 NOTE — Progress Notes (Signed)
 Plan of Care Note for accepted transfer   Patient: Willie Schmidt MRN: [id number]   DOA: 11/07/2024  Facility requesting transfer: [facility]   Requesting Provider: Dr. Rogelia   Reason for transfer: new supplemental O2 requirement   Facility course: 63 yr old man with stage IV non-small cell lung cancer presents with a week of SOB and productive cough.   There is no fever or leukocytosis, Pro-BNP is <50 and troponin <15 x2. CTA chest negative for PE or other acute findings.   He is reportedly requiring 2 Lpm supplemental O2 and was given a liter of LR, Rocephin , azithromycin , and morphine .   Plan of care: The patient is accepted for admission to Telemetry unit[facility].   Author: Evalene GORMAN Sprinkles, MD 11/07/2024  Check [email or web address] for on-call coverage.  Nursing staff, Please call [facility] Admits & Consults System-Wide number on Amion as soon as patient's arrival, so appropriate admitting provider can evaluate the pt.

## 2024-11-07 NOTE — ED Notes (Signed)
 Pt placed on 2lt [place] due to SATS dropping while sleeping. SATS back up to 95%

## 2024-11-07 NOTE — ED Notes (Signed)
 Blood work obtained by Engineer, materials.Marland KitchenMarland Kitchen

## 2024-11-07 NOTE — ED Triage Notes (Signed)
 Arrives POV with complaints of worsening shortness of breath and chest pain x1 month. Reports a history of CHF

## 2024-11-07 NOTE — Sepsis Progress Note (Signed)
 Elink following for sepsis protocol.

## 2024-11-07 NOTE — ED Notes (Signed)
 Pt coughing and appears to increase HR during episodes... 5-Leaded printed off and shown to Provider.[email or web address]

## 2024-11-08 DIAGNOSIS — R748 Abnormal levels of other serum enzymes: Secondary | ICD-10-CM | POA: Diagnosis present

## 2024-11-08 DIAGNOSIS — J9601 Acute respiratory failure with hypoxia: Secondary | ICD-10-CM | POA: Diagnosis not present

## 2024-11-08 DIAGNOSIS — E66812 Obesity, class 2: Secondary | ICD-10-CM | POA: Diagnosis present

## 2024-11-08 DIAGNOSIS — D649 Anemia, unspecified: Secondary | ICD-10-CM | POA: Diagnosis present

## 2024-11-08 LAB — CBC
HCT: 41.6 % (ref 39.0–52.0)
Hemoglobin: 12.9 g/dL — ABNORMAL LOW (ref 13.0–17.0)
MCH: 29.5 pg (ref 26.0–34.0)
MCHC: 31 g/dL (ref 30.0–36.0)
MCV: 95.2 fL (ref 80.0–100.0)
Platelets: 223 K/uL (ref 150–400)
RBC: 4.37 MIL/uL (ref 4.22–5.81)
RDW: 13.1 % (ref 11.5–15.5)
WBC: 9.3 K/uL (ref 4.0–10.5)
nRBC: 0 % (ref 0.0–0.2)

## 2024-11-08 LAB — EXPECTORATED SPUTUM ASSESSMENT W GRAM STAIN, RFLX TO RESP C

## 2024-11-08 LAB — COMPREHENSIVE METABOLIC PANEL WITH GFR
ALT: 10 U/L (ref 0–44)
AST: 17 U/L (ref 15–41)
Albumin: 4.1 g/dL (ref 3.5–5.0)
Alkaline Phosphatase: 146 U/L — ABNORMAL HIGH (ref 38–126)
Anion gap: 10 (ref 5–15)
BUN: 6 mg/dL — ABNORMAL LOW (ref 8–23)
CO2: 26 mmol/L (ref 22–32)
Calcium: 9.3 mg/dL (ref 8.9–10.3)
Chloride: 103 mmol/L (ref 98–111)
Creatinine, Ser: 0.65 mg/dL (ref 0.61–1.24)
GFR, Estimated: >60 mL/min (ref >60)
Glucose, Bld: 101 mg/dL — ABNORMAL HIGH (ref 70–99)
Potassium: 4.1 mmol/L (ref 3.5–5.1)
Sodium: 139 mmol/L (ref 135–145)
Total Bilirubin: 0.6 mg/dL (ref 0.0–1.2)
Total Protein: 7.6 g/dL (ref 6.5–8.1)

## 2024-11-08 LAB — PROCALCITONIN: Procalcitonin: <0.1 ng/mL

## 2024-11-08 LAB — PHOSPHORUS: Phosphorus: 3.1 mg/dL (ref 2.5–4.6)

## 2024-11-08 LAB — MAGNESIUM: Magnesium: 2.4 mg/dL (ref 1.7–2.4)

## 2024-11-08 MED ORDER — SODIUM CHLORIDE 0.9 % IV SOLN
2.0000 g | INTRAVENOUS | Status: DC
  Administered 2024-11-08 – 2024-11-10 (×3): 2 g via INTRAVENOUS
  Filled 2024-11-08 (×3): qty 20

## 2024-11-08 MED ORDER — DULOXETINE HCL 60 MG PO CPEP: 60.0000 mg | ORAL_CAPSULE | Freq: Every day | ORAL | Status: DC

## 2024-11-08 MED ORDER — HYDROCHLOROTHIAZIDE 12.5 MG PO TABS
12.5000 mg | ORAL_TABLET | Freq: Every day | ORAL | Status: DC
  Administered 2024-11-08: 12.5 mg via ORAL
  Filled 2024-11-08: qty 1

## 2024-11-08 MED ORDER — FAMOTIDINE 20 MG PO TABS
20.0000 mg | ORAL_TABLET | Freq: Every day | ORAL | Status: DC
  Administered 2024-11-08 – 2024-11-11 (×4): 20 mg via ORAL
  Filled 2024-11-08 (×4): qty 1

## 2024-11-08 MED ORDER — FLUOCINONIDE 0.05 % EX OINT
1.0000 | TOPICAL_OINTMENT | Freq: Two times a day (BID) | CUTANEOUS | Status: DC
  Administered 2024-11-08 – 2024-11-11 (×6): 1 via TOPICAL
  Filled 2024-11-08: qty 15

## 2024-11-08 MED ORDER — ENOXAPARIN SODIUM 60 MG/0.6ML IJ SOSY
60.0000 mg | PREFILLED_SYRINGE | INTRAMUSCULAR | Status: DC
  Administered 2024-11-08 – 2024-11-09 (×2): 60 mg via SUBCUTANEOUS
  Filled 2024-11-08 (×2): qty 0.6

## 2024-11-08 MED ORDER — GABAPENTIN 300 MG PO CAPS
300.0000 mg | ORAL_CAPSULE | Freq: Three times a day (TID) | ORAL | Status: DC
  Administered 2024-11-08 – 2024-11-11 (×10): 300 mg via ORAL
  Filled 2024-11-08 (×10): qty 1

## 2024-11-08 MED ORDER — TRIAMCINOLONE ACETONIDE 0.1 % EX OINT
1.0000 | TOPICAL_OINTMENT | Freq: Two times a day (BID) | CUTANEOUS | Status: DC
  Administered 2024-11-08 – 2024-11-11 (×5): 1 via TOPICAL
  Filled 2024-11-08: qty 15
  Filled 2024-11-08: qty 80

## 2024-11-08 MED ORDER — ONDANSETRON HCL 4 MG PO TABS: 4.0000 mg | ORAL_TABLET | Freq: Four times a day (QID) | ORAL | Status: DC | PRN

## 2024-11-08 MED ORDER — FOLIC ACID 1 MG PO TABS
1.0000 mg | ORAL_TABLET | Freq: Every day | ORAL | Status: DC
  Administered 2024-11-08 – 2024-11-11 (×4): 1 mg via ORAL
  Filled 2024-11-08 (×4): qty 1

## 2024-11-08 MED ORDER — ONDANSETRON HCL 4 MG/2ML IJ SOLN: 4.0000 mg | Freq: Four times a day (QID) | INTRAMUSCULAR | Status: DC | PRN

## 2024-11-08 MED ORDER — ACETAMINOPHEN 325 MG PO TABS
650.0000 mg | ORAL_TABLET | Freq: Four times a day (QID) | ORAL | Status: DC | PRN
  Administered 2024-11-09: 650 mg via ORAL
  Filled 2024-11-08: qty 2

## 2024-11-08 MED ORDER — AMLODIPINE BESYLATE 5 MG PO TABS
5.0000 mg | ORAL_TABLET | Freq: Every morning | ORAL | Status: DC
  Administered 2024-11-08 – 2024-11-09 (×2): 5 mg via ORAL
  Filled 2024-11-08 (×2): qty 1

## 2024-11-08 MED ORDER — ACETAMINOPHEN 650 MG RE SUPP: 650.0000 mg | Freq: Four times a day (QID) | RECTAL | Status: DC | PRN

## 2024-11-08 MED ORDER — CYCLOBENZAPRINE HCL 10 MG PO TABS
10.0000 mg | ORAL_TABLET | Freq: Three times a day (TID) | ORAL | Status: DC
  Administered 2024-11-08 – 2024-11-11 (×10): 10 mg via ORAL
  Filled 2024-11-08 (×10): qty 1

## 2024-11-08 MED ORDER — LISINOPRIL 20 MG PO TABS
20.0000 mg | ORAL_TABLET | Freq: Every day | ORAL | Status: DC
  Administered 2024-11-08: 20 mg via ORAL
  Filled 2024-11-08: qty 1

## 2024-11-08 MED ORDER — OXYCODONE-ACETAMINOPHEN 5-325 MG PO TABS
1.0000 | ORAL_TABLET | Freq: Three times a day (TID) | ORAL | Status: DC | PRN
  Administered 2024-11-08 – 2024-11-11 (×6): 1 via ORAL
  Filled 2024-11-08 (×6): qty 1

## 2024-11-08 MED ORDER — SENNOSIDES-DOCUSATE SODIUM 8.6-50 MG PO TABS: 1.0000 | ORAL_TABLET | Freq: Every day | ORAL | Status: DC | PRN

## 2024-11-08 MED ORDER — ASPIRIN 81 MG PO TBEC
81.0000 mg | DELAYED_RELEASE_TABLET | Freq: Every day | ORAL | Status: DC
  Administered 2024-11-08 – 2024-11-11 (×4): 81 mg via ORAL
  Filled 2024-11-08 (×4): qty 1

## 2024-11-08 MED ORDER — LISINOPRIL-HYDROCHLOROTHIAZIDE 20-12.5 MG PO TABS: 1.0000 | ORAL_TABLET | Freq: Every day | ORAL | Status: DC

## 2024-11-08 MED ORDER — AZITHROMYCIN 250 MG PO TABS
500.0000 mg | ORAL_TABLET | Freq: Every day | ORAL | Status: AC
  Administered 2024-11-08 – 2024-11-11 (×4): 500 mg via ORAL
  Filled 2024-11-08 (×4): qty 2

## 2024-11-08 MED ORDER — ALBUTEROL SULFATE (2.5 MG/3ML) 0.083% IN NEBU: 2.5000 mg | INHALATION_SOLUTION | Freq: Four times a day (QID) | RESPIRATORY_TRACT | Status: DC | PRN

## 2024-11-08 MED ORDER — SILVER SULFADIAZINE 1 % EX CREA
1.0000 | TOPICAL_CREAM | Freq: Two times a day (BID) | CUTANEOUS | Status: DC
  Administered 2024-11-08 – 2024-11-11 (×6): 1 via TOPICAL
  Filled 2024-11-08: qty 50

## 2024-11-08 MED ORDER — ALBUTEROL SULFATE HFA 108 (90 BASE) MCG/ACT IN AERS: 2.0000 | INHALATION_SPRAY | Freq: Four times a day (QID) | RESPIRATORY_TRACT | Status: DC | PRN

## 2024-11-08 MED ORDER — POLYETHYLENE GLYCOL 3350 17 G PO PACK: 17.0000 g | PACK | Freq: Every day | ORAL | Status: DC | PRN

## 2024-11-08 NOTE — H&P (Signed)
 History and Physical    Patient: Willie Schmidt FMW:[id number] DOB: 1961-08-13 DOA: 11/07/2024 DOS: the patient was seen and examined on 11/08/2024 PCP: [name], MD  Patient coming from: Home  Chief Complaint:  Chief Complaint  Patient presents with   Chest Pain   Shortness of Breath   HPI: Willie Schmidt is a 63 y.o. male with medical history significant of chronic bronchitis, hypertension, history of CAP, stage IV lung adenocarcinoma who presented to emergency department complaints of progressively worse pleuritic chest pain for the past month, progressively worse dyspnea in the last week associated with productive cough and wheezing. He denied fever, chills, rhinorrhea, sore throat, wheezing or hemoptysis. No palpitations, diaphoresis, PND, orthopnea or pitting edema of the lower extremities.  No abdominal pain, nausea, emesis, diarrhea, constipation, melena or hematochezia.  No flank pain, dysuria, frequency or hematuria.  No polyuria, polydipsia, polyphagia or blurred vision.   Lab work: CBC showed a white count of 10.4, hemoglobin 12.8 g/dL platelets 743.  Troponin x 2, proBNP and lactic acid level were normal.  BMP was unremarkable with the exception of nonfasting glucose of 104 mg/dL.  Imaging: Portable 1 view chest radiograph showing left base consolidation and small effusion.  Vascular congestion and prominent cardiac silhouette.  CTA chest no evidence of PE.  Stable left upper lobe and left lower lobe linear scaring or atelectasis.  3 mm posterolateral right middle lobe pulmonary nodule.  Stable ill-defined sclerotic focus within the posterior aspect of the T10 vertebral body.  Stable small, chronic left pleural effusion.  Aortic atherosclerosis.   ED course: Initial vital signs were temperature 98.4 F, pulse 85, respiration 20, BP 154/102 mmHg and O2 sat 97% on room air.  The patient received azithromycin  500 mg IVPB, ceftriaxone  2 g IVPB, LR 1000 mL liter bolus followed by LR  at 150 mL/h x 20 hours and morphine  2 mg IVP x 1 dose.  Review of Systems: As mentioned in the history of present illness. All other systems reviewed and are negative. Past Medical History:  Diagnosis Date   Bronchitis, chronic (HCC)    Hypertension    metastatic lung ca dx'd 02/2019   lyphadenopathy, bil pulmonary noduels; suspected bone lesions   History reviewed. No pertinent surgical history. Social History:  reports that he has quit smoking. His smoking use included cigarettes. He has been exposed to tobacco smoke. He has never used smokeless tobacco. He reports current alcohol use. He reports that he does not use drugs.  No Known Allergies  Family History  Problem Relation Age of Onset   Chronic Renal Failure Mother     Prior to Admission medications   Medication Sig Start Date End Date Taking? Authorizing Provider  amLODipine  (NORVASC ) 5 MG tablet Take 5 mg by mouth in the morning. 06/25/19  Yes [provider]  aspirin  EC 81 MG tablet Take 1 tablet (81 mg total) by mouth daily. Swallow whole. 09/19/23  Yes Samtani, Jai-Gurmukh, MD  cyclobenzaprine  (FLEXERIL ) 10 MG tablet Take 10 mg by mouth 3 (three) times daily. 04/20/24  Yes [provider]  fluocinonide ointment (LIDEX) 0.05 % Apply 1 Application topically daily. Apply 1 Application topically daily. To worst areas of itchiness 10/13/24  Yes [provider]  folic acid  (FOLVITE ) 1 MG tablet Take 1 tablet by mouth daily. 02/16/24 02/15/25 Yes [provider]  furosemide  (LASIX ) 40 MG tablet Take 0.5 tablets (20 mg total) by mouth 2 (two) times daily. 09/18/23  Yes Samtani, Jai-Gurmukh,  MD  gabapentin  (NEURONTIN ) 300 MG capsule Take 1 capsule (300 mg total) by mouth 3 (three) times daily. 02/09/20  Yes Sherrod Sherrod, MD  lisinopril -hydrochlorothiazide  (ZESTORETIC ) 20-25 MG tablet Take 1 tablet by mouth in the morning. 11/10/23  Yes [provider]  oxyCODONE -acetaminophen  (PERCOCET) 10-325  MG tablet Take 1 tablet by mouth 3 (three) times daily. 11/04/24  Yes [provider]  albuterol  (PROVENTIL  HFA;VENTOLIN  HFA) 108 (90 BASE) MCG/ACT inhaler Inhale 2 puffs into the lungs every 6 (six) hours as needed for wheezing. 02/10/13   Nasario Moats, PA-C  albuterol  (PROVENTIL ) (2.5 MG/3ML) 0.083% nebulizer solution Take 2.5 mg by nebulization every 6 (six) hours as needed for wheezing or shortness of breath.    [provider]  Alum Hydroxide-Mag Carbonate (GAVISCON EXTRA STRENGTH) 508-475 MG/10ML SUSP Take 15 mLs by mouth every 4 (four) hours as needed (gas, indigestion).    [provider]  camphor-menthol VIKKI) lotion Apply 1 Application topically daily as needed for itching (dry skin).    [provider]  diclofenac Sodium (VOLTAREN) 1 % GEL Apply 1 g topically. 03/08/24   [provider]  diphenhydrAMINE  (BENADRYL ) 25 mg capsule Take 25 mg by mouth every 6 (six) hours as needed for itching.    [provider]  diphenhydrAMINE -zinc  acetate (BENADRYL ) cream Apply topically 2 (two) times daily as needed for itching. 09/18/23   Samtani, Jai-Gurmukh, MD  doxepin (SINEQUAN) 10 MG capsule Take 10 mg by mouth 3 (three) times daily. 01/12/24   [provider]  doxycycline  (VIBRA -TABS) 100 MG tablet Take 100 mg by mouth 2 (two) times daily. 01/29/24   [provider]  DULoxetine  (CYMBALTA ) 30 MG capsule 1 ONCE DAILY FOR 1 WEEK THEN INCREASE TO 1 TWICE DAILY Patient taking differently: 30 mg 2 (two) times daily. 1 once daily for 1 week then increase to 1 twice daily 10/25/19   Sherrod Sherrod, MD  DULoxetine  (CYMBALTA ) 60 MG capsule Take 60 mg by mouth daily.    [provider]  famotidine  (PEPCID ) 20 MG tablet Take 20 mg by mouth. 03/08/24 03/08/25  [provider]  hydrOXYzine  (ATARAX ) 25 MG tablet Take 25 mg by mouth every 6 (six) hours as needed for itching.    [provider]  hydrOXYzine  (ATARAX ) 25  MG tablet Take 1 tablet (25 mg total) by mouth every 6 (six) hours. 11/27/23   Emil Share, DO  lisinopril -hydrochlorothiazide  (ZESTORETIC ) 20-12.5 MG tablet Take 1 tablet by mouth daily. 05/29/24   [provider]  loratadine (CLARITIN) 10 MG tablet Take 10 mg by mouth daily. 12/29/23   [provider]  methadone  (DOLOPHINE ) 10 MG tablet Take 10 mg by mouth every 8 (eight) hours. 02/23/24   [provider]  methadone  (DOLOPHINE ) 5 MG tablet Take 5 mg by mouth every 8 (eight) hours.    [provider]  methocarbamol  (ROBAXIN ) 500 MG tablet Take 1 tablet (500 mg total) by mouth 2 (two) times daily. 04/10/24   Hoy Nidia FALCON, PA-C  methotrexate (RHEUMATREX) 2.5 MG tablet Take 10 mg by mouth once a week.    [provider]  metolazone  (ZAROXOLYN ) 5 MG tablet Take 5 mg by mouth in the morning. 11/10/23   [provider]  naloxone [facility]) nasal spray 4 mg/0.1 mL Spray the contents of 1 device into 1 nostril. Call 911. May repeat with 2nd device in alternate nostril if no response in 2-3 minutes. 02/16/24   [provider]  ondansetron  (ZOFRAN -ODT) 4 MG  disintegrating tablet SMARTSIG:1 Tablet(s) By Mouth Every 8-12 Hours PRN 04/20/24   [provider]  oxyCODONE  (ROXICODONE ) 5 MG immediate release tablet Take 1 tablet (5 mg total) by mouth every 4 (four) hours as needed for up to 10 doses for severe pain (pain score 7-10). 04/10/24   Hoy Nidia FALCON, PA-C  Oxycodone  HCl 10 MG TABS Take 10 mg by mouth 4 (four) times daily as needed. 03/07/24   [provider]  oxyCODONE -acetaminophen  (PERCOCET/ROXICET) 5-325 MG tablet Take 1 tablet by mouth every 6 (six) hours as needed. 05/11/24   [provider]  polyethylene glycol (MIRALAX  / GLYCOLAX ) 17 g packet Take 17 g by mouth daily as needed for mild constipation.    [provider]  potassium chloride  SA (KLOR-CON ) 20 MEQ tablet 1 Tablet once daily while taking  Lasix  Patient taking differently: Take 20 mEq by mouth daily. While taking Lasix . 10/20/19   Tanner, Van E., PA-C  predniSONE  (DELTASONE ) 10 MG tablet Take 10 mg by mouth 2 (two) times daily with a meal.    [provider]  predniSONE  (DELTASONE ) 10 MG tablet Take 2 tablets (20 mg total) by mouth daily. 11/27/23   Emil Share, DO  predniSONE  (DELTASONE ) 5 MG tablet Take 5 mg by mouth daily. 01/06/24   [provider]  senna (SENOKOT) 8.6 MG TABS tablet Take 2 tablets by mouth daily.    [provider]  sennosides-docusate sodium  (SENOKOT-S) 8.6-50 MG tablet Take 2 tablets by mouth in the morning, at noon, and at bedtime.    [provider]  silver sulfADIAZINE (SILVADENE) 1 % cream Apply to the open eroded areas of skin daily as needed 01/29/24   [provider]  triamcinolone ointment (KENALOG) 0.1 % SMARTSIG:Topical 1-2 Times Daily PRN    [provider]  UNABLE TO FIND Take 2 tablets by mouth daily as needed. Med Name: IVM    [provider]    Physical Exam: Vitals:   11/08/24 0800 11/08/24 0830 11/08/24 0937 11/08/24 1058  BP: 123/85 138/81  (!) 144/103  Pulse: (!) 33 90 (!) 45 (!) 48  Resp: 19 (!) 24 (!) 21 17  Temp:   (!) 97.5 F (36.4 C)   TempSrc:   Oral   SpO2: 96% 99% 99% 100%  Weight:      Height:       Physical Exam Vitals and nursing note reviewed.  Constitutional:      General: He is awake. He is not in acute distress.    Appearance: He is well-developed. He is obese. He is ill-appearing.     Interventions: Nasal cannula in place.  HENT:     Head: Normocephalic.     Nose: No rhinorrhea.     Mouth/Throat:     Mouth: Mucous membranes are moist.  Eyes:     General: No scleral icterus.    Pupils: Pupils are equal, round, and reactive to light.  Neck:     Vascular: No JVD.  Cardiovascular:     Rate and Rhythm: Normal rate and regular rhythm.     Heart sounds: S1 normal and S2 normal.  Pulmonary:      Effort: Pulmonary effort is normal.     Breath sounds: Normal breath sounds. No wheezing, rhonchi or rales.  Abdominal:     General: Bowel sounds are normal. There is no distension.     Palpations: Abdomen is soft.     Tenderness: There is no abdominal tenderness. There  is no right CVA tenderness.  Musculoskeletal:     Cervical back: Neck supple.     Right lower leg: No edema.     Left lower leg: No edema.  Skin:    General: Skin is warm and dry.  Neurological:     General: No focal deficit present.     Mental Status: He is alert and oriented to person, place, and time.  Psychiatric:        Mood and Affect: Mood normal.        Behavior: Behavior normal. Behavior is cooperative.     Data Reviewed:  Results are pending, will review when available.  EKG: Vent. rate 102 BPM PR interval 185 ms QRS duration 98 ms QT/QTcB 371/484 ms P-R-T axes 69 12 56 Sinus tachycardia with irregular rate Abnormal R-wave progression, early transition Borderline T abnormalities, anterior leads Borderline prolonged QT interval  Assessment and Plan: Principal Problem:   Acute hypoxemic respiratory failure (HCC) In the setting of:   CAP (community acquired pneumonia) Superimposed on:   Adenocarcinoma of left lung, stage 4 (HCC) Admit to telemetry/inpatient. Continue supplemental oxygen. Scheduled and as needed bronchodilators. Continue ceftriaxone  1 g IVPB daily. Continue azithromycin  500 mg IVPB daily. Check strep pneumoniae urinary antigen. Check sputum Gram stain, culture and sensitivity. Follow-up blood culture and sensitivity. Follow-up CBC and chemistry in the morning.  Active Problems:   Elevated alkaline phosphatase level Secondary to bone mets.    Normocytic anemia Monitor hematocrit and hemoglobin.    Class 2 obesity  Current BMI 39.13 kg/m. Would benefit from lifestyle modifications. Follow-up closely with PCP.    Advance Care Planning:   Code Status: Full Code    Consults:   Family Communication:   Severity of Illness: The appropriate patient status for this patient is OBSERVATION. Observation status is judged to be reasonable and necessary in order to provide the required intensity of service to ensure the patient's safety. The patient's presenting symptoms, physical exam findings, and initial radiographic and laboratory data in the context of their medical condition is felt to place them at decreased risk for further clinical deterioration. Furthermore, it is anticipated that the patient will be medically stable for discharge from the hospital within 2 midnights of admission.   Author: Alm Dorn Castor, MD 11/08/2024 11:37 AM  For on call review [email or web address].   This document was prepared using Dragon voice recognition software and may contain some unintended transcription errors.

## 2024-11-08 NOTE — Plan of Care (Signed)

## 2024-11-08 NOTE — Plan of Care (Signed)

## 2024-11-08 NOTE — ED Notes (Signed)
 [facility] at bedside.  Chart marked and green ready for [facility]

## 2024-11-08 NOTE — ED Notes (Signed)
 Called [facility] to transport the patient to [facility] 5E rm# [id number]

## 2024-11-08 NOTE — Progress Notes (Signed)
 [facility]   Bed Availability Yes  Level of Care Needed:  Yes  MD Agree to transfer: N/A  Patient agree to transfer: No  Sharolyn Batman, RN [facility]

## 2024-11-09 ENCOUNTER — Observation Stay (HOSPITAL_COMMUNITY): Payer: MEDICAID

## 2024-11-09 DIAGNOSIS — J9601 Acute respiratory failure with hypoxia: Secondary | ICD-10-CM | POA: Diagnosis not present

## 2024-11-09 DIAGNOSIS — R9431 Abnormal electrocardiogram [ECG] [EKG]: Secondary | ICD-10-CM

## 2024-11-09 DIAGNOSIS — J189 Pneumonia, unspecified organism: Secondary | ICD-10-CM | POA: Diagnosis present

## 2024-11-09 LAB — ECHOCARDIOGRAM COMPLETE
Area-P 1/2: 4.1 cm2
Calc EF: 59.2 %
Height: 69 in
MV VTI: 4.01 cm2
S' Lateral: 3.8 cm
Single Plane A2C EF: 63.8 %
Single Plane A4C EF: 55.3 %
Weight: 4239.89 [oz_av]

## 2024-11-09 LAB — COMPREHENSIVE METABOLIC PANEL WITH GFR
ALT: 9 U/L (ref 0–44)
AST: 18 U/L (ref 15–41)
Albumin: 4.1 g/dL (ref 3.5–5.0)
Alkaline Phosphatase: 147 U/L — ABNORMAL HIGH (ref 38–126)
Anion gap: 12 (ref 5–15)
BUN: 8 mg/dL (ref 8–23)
CO2: 28 mmol/L (ref 22–32)
Calcium: 9.7 mg/dL (ref 8.9–10.3)
Chloride: 103 mmol/L (ref 98–111)
Creatinine, Ser: 0.6 mg/dL — ABNORMAL LOW (ref 0.61–1.24)
GFR, Estimated: >60 mL/min (ref >60)
Glucose, Bld: 92 mg/dL (ref 70–99)
Potassium: 4.3 mmol/L (ref 3.5–5.1)
Sodium: 142 mmol/L (ref 135–145)
Total Bilirubin: 0.5 mg/dL (ref 0.0–1.2)
Total Protein: 7.9 g/dL (ref 6.5–8.1)

## 2024-11-09 LAB — CBC
HCT: 44.5 % (ref 39.0–52.0)
Hemoglobin: 13.4 g/dL (ref 13.0–17.0)
MCH: 29.3 pg (ref 26.0–34.0)
MCHC: 30.1 g/dL (ref 30.0–36.0)
MCV: 97.2 fL (ref 80.0–100.0)
Platelets: 248 K/uL (ref 150–400)
RBC: 4.58 MIL/uL (ref 4.22–5.81)
RDW: 13.3 % (ref 11.5–15.5)
WBC: 8 K/uL (ref 4.0–10.5)
nRBC: 0 % (ref 0.0–0.2)

## 2024-11-09 LAB — STREP PNEUMONIAE URINARY ANTIGEN: Strep Pneumo Urinary Antigen: NEGATIVE

## 2024-11-09 MED ORDER — METHYLPREDNISOLONE SODIUM SUCC 40 MG IJ SOLR
40.0000 mg | Freq: Two times a day (BID) | INTRAMUSCULAR | Status: DC
  Administered 2024-11-09 – 2024-11-10 (×3): 40 mg via INTRAVENOUS
  Filled 2024-11-09 (×3): qty 1

## 2024-11-09 MED ORDER — IPRATROPIUM-ALBUTEROL 0.5-2.5 (3) MG/3ML IN SOLN
3.0000 mL | Freq: Four times a day (QID) | RESPIRATORY_TRACT | Status: DC
  Administered 2024-11-09 – 2024-11-11 (×7): 3 mL via RESPIRATORY_TRACT
  Filled 2024-11-09 (×9): qty 3

## 2024-11-09 MED ORDER — DILTIAZEM HCL-DEXTROSE 125-5 MG/125ML-% IV SOLN (PREMIX)
5.0000 mg/h | INTRAVENOUS | Status: DC
  Administered 2024-11-09: 5 mg/h via INTRAVENOUS
  Administered 2024-11-09 – 2024-11-10 (×2): 15 mg/h via INTRAVENOUS
  Filled 2024-11-09 (×5): qty 125

## 2024-11-09 MED ORDER — DILTIAZEM LOAD VIA INFUSION
10.0000 mg | Freq: Once | INTRAVENOUS | Status: AC
  Administered 2024-11-09: 10 mg via INTRAVENOUS
  Filled 2024-11-09: qty 10

## 2024-11-09 MED ORDER — PERFLUTREN LIPID MICROSPHERE
1.0000 mL | INTRAVENOUS | Status: AC | PRN
  Administered 2024-11-09: 2 mL via INTRAVENOUS

## 2024-11-09 MED ORDER — GUAIFENESIN ER 600 MG PO TB12
600.0000 mg | ORAL_TABLET | Freq: Two times a day (BID) | ORAL | Status: DC
  Administered 2024-11-09 – 2024-11-11 (×5): 600 mg via ORAL
  Filled 2024-11-09 (×5): qty 1

## 2024-11-09 MED ORDER — METOPROLOL TARTRATE 25 MG PO TABS
25.0000 mg | ORAL_TABLET | Freq: Two times a day (BID) | ORAL | Status: DC
  Administered 2024-11-09 – 2024-11-11 (×4): 25 mg via ORAL
  Filled 2024-11-09 (×4): qty 1

## 2024-11-09 MED ORDER — OXYCODONE HCL 5 MG PO TABS
5.0000 mg | ORAL_TABLET | Freq: Three times a day (TID) | ORAL | Status: DC | PRN
  Administered 2024-11-09: 5 mg via ORAL
  Filled 2024-11-09: qty 1

## 2024-11-09 MED ORDER — METOPROLOL TARTRATE 5 MG/5ML IV SOLN
10.0000 mg | Freq: Once | INTRAVENOUS | Status: AC
  Administered 2024-11-09: 10 mg via INTRAVENOUS
  Filled 2024-11-09: qty 10

## 2024-11-09 NOTE — Plan of Care (Signed)
  Problem: Education: Goal: Knowledge of General Education information will improve Description: Including pain rating scale, medication(s)/side effects and non-pharmacologic comfort measures Outcome: Progressing   Problem: Health Behavior/Discharge Planning: Goal: Ability to manage health-related needs will improve Outcome: Progressing   Problem: Clinical Measurements: Goal: Ability to maintain clinical measurements within normal limits will improve Outcome: Progressing Goal: Will remain free from infection Outcome: Progressing Goal: Diagnostic test results will improve Outcome: Progressing Goal: Respiratory complications will improve Outcome: Progressing   Problem: Activity: Goal: Risk for activity intolerance will decrease Outcome: Progressing   Problem: Nutrition: Goal: Adequate nutrition will be maintained Outcome: Progressing   Problem: Coping: Goal: Level of anxiety will decrease Outcome: Progressing   Problem: Elimination: Goal: Will not experience complications related to bowel motility Outcome: Progressing Goal: Will not experience complications related to urinary retention Outcome: Progressing   Problem: Pain Managment: Goal: General experience of comfort will improve and/or be controlled Outcome: Progressing   Problem: Safety: Goal: Ability to remain free from injury will improve Outcome: Progressing   Problem: Skin Integrity: Goal: Risk for impaired skin integrity will decrease Outcome: Progressing   Problem: Activity: Goal: Ability to tolerate increased activity will improve Outcome: Progressing   Problem: Clinical Measurements: Goal: Ability to maintain a body temperature in the normal range will improve Outcome: Progressing   Problem: Respiratory: Goal: Ability to maintain adequate ventilation will improve Outcome: Progressing Goal: Ability to maintain a clear airway will improve Outcome: Progressing   Problem: Clinical  Measurements: Goal: Cardiovascular complication will be avoided Outcome: Not Progressing

## 2024-11-09 NOTE — Progress Notes (Signed)
  Echocardiogram 2D Echocardiogram has been performed.  Willie Schmidt [facility] 11/09/2024, 9:47 AM

## 2024-11-09 NOTE — Progress Notes (Signed)
 PROGRESS NOTE    Willie Schmidt  FMW:[id number] DOB: [birth year] DOA: 11/07/2024 PCP: [name], MD    63/M w history of chronic bronchitis, stage IV non-small cell lung cancer, hypertension, pneumonia presented to the ED with cough, pleuritic chest pain, shortness of breath and some wheezing.  Not under any current treatment for lung CA, workup in the ED noted WBC of 10.4, hemoglobin 12.8, troponin negative, proBNP normal, chest x-ray noted?  Left basilar consolidation and pleural effusion, CTA chest noted no PE, left upper lobe and lower lobe linear scarring or atelectasis, right middle lobe nodule  Subjective: -Feels a little better, coughing a lot, productive  Assessment and Plan:  Acute on chronic hypoxic respiratory failure Acute on chronic bronchitis with exacerbation -?  Pneumonia, on imaging as well - Continue IV ceftriaxone  and azithromycin ,  - Add IV Solu-Medrol , DuoNebs, Mucinex, flutter valve - Needs close follow-up with his oncologist at [facility] - Echo ordered by admitting MD, will follow-up  Adenocarcinoma left lung stage IV - Noted by oncology at [facility], not under any current treatment, under surveillance - Advised need for follow-up  Elevated alkaline phosphatase - Likely secondary to known bony metastasis  Normocytic anemia  Class II obesity   DVT prophylaxis: Lovenox  Code Status: Full code Family Communication: None present Disposition Plan: Home likely 48 hours  Consultants:    Procedures:   Antimicrobials:    Objective: Vitals:   11/08/24 1148 11/08/24 1519 11/08/24 2017 11/09/24 0440  BP: (!) 149/83 (!) 142/84 (!) 155/88 125/70  Pulse: 88 82 85 80  Resp: 18 18 19 19   Temp: (!) 97.3 F (36.3 C)  (!) 97.5 F (36.4 C) 97.6 F (36.4 C)  TempSrc: Oral     SpO2: 98% 100% 97% 100%  Weight:      Height:        Intake/Output Summary (Last 24 hours) at 11/09/2024 1017 Last data filed at 11/08/2024 1700 Gross per 24 hour  Intake 2240 ml   Output --  Net 2240 ml   Filed Weights   11/07/24 1904  Weight: 120.2 kg    Examination:  General exam: Appears calm and comfortable pleasant, AO x 3 Respiratory system: Few scattered rhonchi, expiratory wheezes Cardiovascular system: S1 & S2 heard, RRR.  Abd: nondistended, soft and nontender.Normal bowel sounds heard. Central nervous system: Alert and oriented. No focal neurological deficits. Extremities: no edema Skin: No rashes Psychiatry:  Mood & affect appropriate.     Data Reviewed:   CBC: Recent Labs  Lab 11/07/24 1904 11/08/24 1257 11/09/24 0551  WBC 10.4 9.3 8.0  HGB 12.8* 12.9* 13.4  HCT 39.7 41.6 44.5  MCV 91.9 95.2 97.2  PLT 256 223 248   Basic Metabolic Panel: Recent Labs  Lab 11/07/24 1904 11/08/24 1257 11/09/24 0551  NA 137 139 142  K 3.7 4.1 4.3  CL 99 103 103  CO2 27 26 28   GLUCOSE 104* 101* 92  BUN 11 6* 8  CREATININE 0.71 0.65 0.60*  CALCIUM 9.6 9.3 9.7  MG  --  2.4  --   PHOS  --  3.1  --    GFR: Estimated Creatinine Clearance: 121 mL/min (A) (by C-G formula based on SCr of 0.6 mg/dL (L)). Liver Function Tests: Recent Labs  Lab 11/08/24 1257 11/09/24 0551  AST 17 18  ALT 10 9  ALKPHOS 146* 147*  BILITOT 0.6 0.5  PROT 7.6 7.9  ALBUMIN 4.1 4.1   No results for input(s): LIPASE, AMYLASE in  the last 168 hours. No results for input(s): AMMONIA in the last 168 hours. Coagulation Profile: No results for input(s): INR, PROTIME in the last 168 hours. Cardiac Enzymes: No results for input(s): CKTOTAL, CKMB, CKMBINDEX, TROPONINI in the last 168 hours. BNP (last 3 results) Recent Labs    11/07/24 2030  PROBNP <50.0   HbA1C: No results for input(s): HGBA1C in the last 72 hours. CBG: No results for input(s): GLUCAP in the last 168 hours. Lipid Profile: No results for input(s): CHOL, HDL, LDLCALC, TRIG, CHOLHDL, LDLDIRECT in the last 72 hours. Thyroid  Function Tests: No results for input(s):  TSH, T4TOTAL, FREET4, T3FREE, THYROIDAB in the last 72 hours. Anemia Panel: No results for input(s): VITAMINB12, FOLATE, FERRITIN, TIBC, IRON, RETICCTPCT in the last 72 hours. Urine analysis:    Component Value Date/Time   COLORURINE YELLOW 04/10/2024 1053   APPEARANCEUR CLEAR 04/10/2024 1053   LABSPEC 1.015 04/10/2024 1053   PHURINE 6.0 04/10/2024 1053   GLUCOSEU NEGATIVE 04/10/2024 1053   HGBUR NEGATIVE 04/10/2024 1053   BILIRUBINUR NEGATIVE 04/10/2024 1053   KETONESUR NEGATIVE 04/10/2024 1053   PROTEINUR NEGATIVE 04/10/2024 1053   UROBILINOGEN 1.0 05/08/2010 1229   NITRITE NEGATIVE 04/10/2024 1053   LEUKOCYTESUR NEGATIVE 04/10/2024 1053   Sepsis Labs: @LABRCNTIP (procalcitonin:4,lacticidven:4)  ) Recent Results (from the past 240 hours)  Culture, blood (single)     Status: None (Preliminary result)   Collection Time: 11/07/24 10:56 PM   Specimen: BLOOD  Result Value Ref Range Status   Specimen Description   Final    BLOOD RIGHT ANTECUBITAL Performed at [facility], [address] [place], [place] [address]    Special Requests   Final    BOTTLES DRAWN AEROBIC AND ANAEROBIC Blood Culture adequate volume Performed at [facility], [address] [place], [place] [address]    Culture   Final    NO GROWTH < 24 HOURS Performed at [facility], [address] [place], [place] [address]    Report Status PENDING  Incomplete  Expectorated Sputum Assessment w Gram Stain, Rflx to Resp Cult     Status: None   Collection Time: 11/08/24  9:15 PM   Specimen: Expectorated Sputum  Result Value Ref Range Status   Specimen Description EXPECTORATED SPUTUM  Final   Special Requests NONE  Final   Sputum evaluation   Final    THIS SPECIMEN IS ACCEPTABLE FOR SPUTUM CULTURE Performed at [facility], [address] [place], [place] [address]    Report Status 11/08/2024 FINAL  Final  Culture,  Respiratory w Gram Stain     Status: None (Preliminary result)   Collection Time: 11/08/24  9:15 PM  Result Value Ref Range Status   Specimen Description   Final    EXPECTORATED SPUTUM Performed at [facility], [address] [place], [place] [address]    Special Requests   Final    NONE Reflexed from [id number] Performed at [facility], [address], [place], [place] [address]    Gram Stain   Final    FEW WBC PRESENT,BOTH PMN AND MONONUCLEAR NO ORGANISMS SEEN Performed at [facility], [address] [place], [place] [address]    Culture PENDING  Incomplete   Report Status PENDING  Incomplete     Radiology Studies: CT Angio Chest PE W and/or Wo Contrast Result Date: 11/07/2024 CLINICAL DATA:  Worsening shortness of breath and chest pain x1 month. EXAM: CT ANGIOGRAPHY CHEST  WITH CONTRAST TECHNIQUE: Multidetector CT imaging of the chest was performed using the standard protocol during bolus administration of intravenous contrast. Multiplanar CT image reconstructions and MIPs were obtained to evaluate the vascular anatomy. RADIATION DOSE REDUCTION: This exam was performed according to the departmental dose-optimization program which includes automated exposure control, adjustment of the mA and/or kV according to patient size and/or use of iterative reconstruction technique. CONTRAST:  75mL OMNIPAQUE  IOHEXOL  350 MG/ML SOLN COMPARISON:  September 16, 2023 FINDINGS: Cardiovascular: There is moderate severity calcification of the aortic arch, without evidence of aortic aneurysm or dissection. Satisfactory opacification of the pulmonary arteries to the segmental level. No evidence of pulmonary embolism. Normal heart size with mild coronary artery calcification. No pericardial effusion. Mediastinum/Nodes: A 2.3 cm x 1.7 cm ill-defined area soft tissue attenuation is seen within the AP window. This measured 2.2 cm x 2.2 cm on the prior study. Stable  peribronchial thickening is seen along the left hilum. Thyroid  gland, trachea, and esophagus demonstrate no significant findings. Lungs/Pleura: Stable mild to moderate severity left upper lobe and left lower lobe linear scarring and/or atelectasis is seen with stable left-sided volume loss. A stable area of suspected round atelectasis with calcification is noted within the left lung base. A 3 mm pleural based pulmonary nodule is seen within the posterolateral aspect of the right middle lobe (axial CT image 79, CT series 6). This is not clearly identified on the prior study. There is a small, stable, chronic left pleural effusion. No pneumothorax is identified. Upper Abdomen: No acute abnormality. Musculoskeletal: A stable, ill-defined sclerotic focus is seen within the posterior aspect of the T10 vertebral body. Review of the MIP images confirms the above findings. IMPRESSION: 1. No evidence of pulmonary embolism. 2. Stable left upper lobe and left lower lobe linear scarring and/or atelectasis with a stable area of suspected round atelectasis within the left lung base. 3. 3 mm posterolateral right middle lobe pulmonary nodule. No follow-up needed if patient is low-risk.This recommendation follows the consensus statement: Guidelines for Management of Incidental Pulmonary Nodules Detected on CT Images: From the Fleischner Society 2017; Radiology 2017; 284:228-243. 4. Stable, ill-defined sclerotic focus within the posterior aspect of the T10 vertebral body. 5. Stable small, chronic left pleural effusion. 6. Aortic atherosclerosis. Electronically Signed   By: Suzen Dials M.D.   On: 11/07/2024 21:10   DG Chest Port 1 View Result Date: 11/07/2024 EXAM: 1 VIEW(S) XRAY OF THE CHEST 11/07/2024 07:17:00 PM COMPARISON: 09/16/2023. CLINICAL HISTORY: Chest pain. FINDINGS: LUNGS AND PLEURA: Left base consolidation. Small left pleural effusion. No pneumothorax. HEART AND MEDIASTINUM: Vascular congestion. Prominent  cardiac silhouette. BONES AND SOFT TISSUES: No acute osseous abnormality. IMPRESSION: 1. Left base consolidation and small effusion. 2. Vascular congestion and prominent cardiac silhouette. Electronically signed by: Fonda Field MD 11/07/2024 07:28 PM EST RP Workstation: [id number]     Scheduled Meds:  amLODipine   5 mg Oral q AM   aspirin  EC  81 mg Oral Daily   azithromycin   500 mg Oral Daily   cyclobenzaprine   10 mg Oral TID   enoxaparin  (LOVENOX ) injection  60 mg Subcutaneous Q24H   famotidine   20 mg Oral Daily   fluocinonide ointment  1 Application Topical BID   folic acid   1 mg Oral Daily   gabapentin   300 mg Oral TID   guaiFENesin  600 mg Oral BID   ipratropium-albuterol   3 mL Nebulization QID   methylPREDNISolone  (SOLU-MEDROL ) injection  40 mg Intravenous Q12H   silver sulfADIAZINE  1 Application Topical BID   triamcinolone ointment  1 Application Topical BID   Continuous Infusions:  cefTRIAXone  (ROCEPHIN )  IV 2 g (11/08/24 2110)     LOS: 0 days    Time spent:    Sigurd Pac, MD [facility]   11/09/2024, 10:17 AM

## 2024-11-09 NOTE — Progress Notes (Signed)
 HR Afib 128-150 sustaining, Pt denies CP dizziness, weakness or worsening SOB. Hospitalist provider updated.    11/09/24 1150  Vitals  Temp 97.7 F (36.5 C)  Temp Source Oral  BP (!) 124/94  MAP (mmHg) 101  BP Location Right Arm  BP Method Automatic  Patient Position (if appropriate) Sitting  Pulse Rate (!) 114  Pulse Rate Source Monitor  Resp 20  MEWS COLOR  MEWS Score Color Yellow  Oxygen Therapy  SpO2 100 %  O2 Device Aerosol Mask  MEWS Score  MEWS Temp 0  MEWS Systolic 0  MEWS Pulse 2  MEWS RR 0  MEWS LOC 0  MEWS Score 2

## 2024-11-10 ENCOUNTER — Other Ambulatory Visit (HOSPITAL_COMMUNITY): Payer: Self-pay

## 2024-11-10 ENCOUNTER — Telehealth (HOSPITAL_COMMUNITY): Payer: Self-pay

## 2024-11-10 DIAGNOSIS — J9601 Acute respiratory failure with hypoxia: Secondary | ICD-10-CM | POA: Diagnosis not present

## 2024-11-10 LAB — CBC
HCT: 42.2 % (ref 39.0–52.0)
Hemoglobin: 13.2 g/dL (ref 13.0–17.0)
MCH: 29.3 pg (ref 26.0–34.0)
MCHC: 31.3 g/dL (ref 30.0–36.0)
MCV: 93.8 fL (ref 80.0–100.0)
Platelets: 285 K/uL (ref 150–400)
RBC: 4.5 MIL/uL (ref 4.22–5.81)
RDW: 13.2 % (ref 11.5–15.5)
WBC: 14.7 K/uL — ABNORMAL HIGH (ref 4.0–10.5)
nRBC: 0.1 % (ref 0.0–0.2)

## 2024-11-10 LAB — MISC LABCORP TEST (SEND OUT): Labcorp test code: 83935

## 2024-11-10 LAB — BASIC METABOLIC PANEL WITH GFR
Anion gap: 11 (ref 5–15)
BUN: 13 mg/dL (ref 8–23)
CO2: 25 mmol/L (ref 22–32)
Calcium: 9.5 mg/dL (ref 8.9–10.3)
Chloride: 102 mmol/L (ref 98–111)
Creatinine, Ser: 0.74 mg/dL (ref 0.61–1.24)
GFR, Estimated: >60 mL/min (ref >60)
Glucose, Bld: 156 mg/dL — ABNORMAL HIGH (ref 70–99)
Potassium: 4.4 mmol/L (ref 3.5–5.1)
Sodium: 138 mmol/L (ref 135–145)

## 2024-11-10 MED ORDER — METHYLPREDNISOLONE SODIUM SUCC 40 MG IJ SOLR
40.0000 mg | INTRAMUSCULAR | Status: DC
  Administered 2024-11-11: 40 mg via INTRAVENOUS
  Filled 2024-11-10: qty 1

## 2024-11-10 MED ORDER — APIXABAN 5 MG PO TABS
5.0000 mg | ORAL_TABLET | Freq: Two times a day (BID) | ORAL | Status: DC
  Administered 2024-11-10 – 2024-11-11 (×2): 5 mg via ORAL
  Filled 2024-11-10 (×2): qty 1

## 2024-11-10 MED ORDER — DILTIAZEM HCL 60 MG PO TABS
90.0000 mg | ORAL_TABLET | Freq: Four times a day (QID) | ORAL | Status: DC
  Administered 2024-11-10 – 2024-11-11 (×4): 90 mg via ORAL
  Filled 2024-11-10 (×4): qty 1

## 2024-11-10 NOTE — TOC Initial Note (Signed)
 Transition of Care [facility]) - Initial/Assessment Note   Patient Details  Name: Willie Schmidt MRN: [id number] Date of Birth: December 07, 1961  Transition of [facility]) CM/SW Contact:    Duwaine GORMAN Aran, LCSW Phone Number: 11/10/2024, 9:17 AM  Clinical Narrative: Patient is from home and is currently requiring 2L/min oxygen. Care management following for possible home oxygen needs.  Expected Discharge Plan: Home/Self Care Barriers to Discharge: Continued Medical Work up  Expected Discharge Plan and Services In-house Referral: Clinical Social Work Living arrangements for the past 2 months: Single Family Home  Prior Living Arrangements/Services Living arrangements for the past 2 months: Single Family Home Patient language and need for interpreter reviewed:: Yes Do you feel safe going back to the place where you live?: Yes      Need for Family Participation in Patient Care: No (Comment) Care giver support system in place?: Yes (comment) Criminal Activity/Legal Involvement Pertinent to Current Situation/Hospitalization: No - Comment as needed  Activities of Daily Living ADL Screening (condition at time of admission) Independently performs ADLs?: Yes (appropriate for developmental age) Is the patient deaf or have difficulty hearing?: No Does the patient have difficulty seeing, even when wearing glasses/contacts?: No Does the patient have difficulty concentrating, remembering, or making decisions?: No  Emotional Assessment Alcohol / Substance Use: Not Applicable Psych Involvement: No (comment)  Admission diagnosis:  Acute respiratory failure with hypoxia (HCC) [J96.01] Acute hypoxemic respiratory failure (HCC) [J96.01] Pneumonia of left lower lobe due to infectious organism [J18.9] Pneumonia [J18.9] Patient Active Problem List   Diagnosis Date Noted   Pneumonia 11/09/2024   Elevated alkaline phosphatase level 11/08/2024   Normocytic anemia 11/08/2024   Class 2 obesity 11/08/2024   Acute  hypoxemic respiratory failure (HCC) 11/07/2024   Adenocarcinoma (HCC) 02/16/2024   NSCLC of left lung (HCC) 02/16/2024   Palliative care encounter 12/01/2023   Cellulitis 11/27/2023   Rash 11/26/2023   Hypokalemia 11/26/2023   Fever 09/17/2023   CAP (community acquired pneumonia) 09/16/2023   Right shoulder pain 04/22/2019   Adenocarcinoma of left lung, stage 4 (HCC) 03/03/2019   Encounter for antineoplastic chemotherapy 03/03/2019   Encounter for antineoplastic immunotherapy 03/03/2019   Goals of care, counseling/discussion 03/03/2019   Neck pain on right side 03/03/2019   Neck pain 02/17/2019   PCP:  [name], MD Pharmacy:   [facility] [phone] - [place], [place] - [address] AT [facility] [address] [facility] [address] [place] [address] Phone: [phone] Fax: [phone]  Social Drivers of Health (SDOH) Social History: SDOH Screenings   Food Insecurity: No Food Insecurity (11/08/2024)  Housing: Low Risk  (11/08/2024)  Transportation Needs: No Transportation Needs (11/08/2024)  Utilities: Not At Risk (11/08/2024)  Social Connections: Socially Integrated (11/08/2024)  Tobacco Use: Medium Risk (11/07/2024)   SDOH Interventions:    Readmission Risk Interventions    11/10/2024    9:16 AM  Readmission Risk Prevention Plan  Transportation Screening Complete  HRI or Home Care Consult Complete  Social Work Consult for Recovery Care Planning/Counseling Complete  Palliative Care Screening Not Applicable  Medication Review Oceanographer) Complete

## 2024-11-10 NOTE — Progress Notes (Signed)
 PHARMACY - ANTICOAGULATION CONSULT NOTE  Pharmacy Consult for Eliquis Indication: new onset atrial fibrillation with RVR  No Known Allergies  Patient Measurements: Height: 5' 9 (175.3 cm) Weight: 120.2 kg (264 lb 15.9 oz) IBW/kg (Calculated) : 70.7 HEPARIN  DW (KG): 97.9  Vital Signs: Temp: 98.4 F (36.9 C) (11/19 1326) Temp Source: Oral (11/19 1326) BP: 112/65 (11/19 1326) Pulse Rate: 70 (11/19 1326)  Labs: Recent Labs    11/08/24 1257 11/09/24 0551 11/10/24 0402  HGB 12.9* 13.4 13.2  HCT 41.6 44.5 42.2  PLT 223 248 285  CREATININE 0.65 0.60* 0.74    Estimated Creatinine Clearance: 121 mL/min (by C-G formula based on SCr of 0.74 mg/dL).   Medical History: Past Medical History:  Diagnosis Date   Bronchitis, chronic (HCC)    Hypertension    metastatic lung ca dx'd 02/2019   lyphadenopathy, bil pulmonary noduels; suspected bone lesions     Assessment: Patient is a 63 y.o M with hx chronic bronchitis and lung cancer who presented to the ED on 11/07/24 with c/o CP and SOB. Pharmacy has been consulted on 11/10/24 to dose Eliquis for new onset afib.   Today, 11/10/2024: - scr 0.74 - hgb and plts ok - pt received lovenox  60 mg (~0.5mg /kg/day) for VTE pxx on 11/09/24 at ~9:30pm  Plan:  - d/c lovenox  - start Eliquis 5mg  bid  Micayla Brathwaite P 11/10/2024,4:14 PM

## 2024-11-10 NOTE — Plan of Care (Signed)
  Problem: Clinical Measurements: Goal: Ability to maintain clinical measurements within normal limits will improve Outcome: Progressing Goal: Respiratory complications will improve Outcome: Progressing Goal: Cardiovascular complication will be avoided Outcome: Progressing   Problem: Safety: Goal: Ability to remain free from injury will improve Outcome: Progressing   Problem: Clinical Measurements: Goal: Ability to maintain a body temperature in the normal range will improve Outcome: Progressing

## 2024-11-10 NOTE — Telephone Encounter (Signed)
 Pharmacy Patient Advocate Encounter  Insurance verification completed.    The patient is insured through [employer] [place].     Ran test claim for Xarelto 20mg  and the current 30 day co-pay is $4.  Ran test claim for Eliquis 5mg  and the current 30 day co-pay is $4.   This test claim was processed through [employer]- copay amounts may vary at other pharmacies due to [employer], or as the patient moves through the different stages of their insurance plan.

## 2024-11-10 NOTE — Plan of Care (Signed)
  Problem: Education: Goal: Knowledge of General Education information will improve Description: Including pain rating scale, medication(s)/side effects and non-pharmacologic comfort measures Outcome: Progressing   Problem: Health Behavior/Discharge Planning: Goal: Ability to manage health-related needs will improve Outcome: Progressing   Problem: Clinical Measurements: Goal: Ability to maintain clinical measurements within normal limits will improve Outcome: Progressing Goal: Cardiovascular complication will be avoided Outcome: Progressing   Problem: Activity: Goal: Risk for activity intolerance will decrease Outcome: Progressing   Problem: Nutrition: Goal: Adequate nutrition will be maintained Outcome: Progressing   Problem: Pain Managment: Goal: General experience of comfort will improve and/or be controlled Outcome: Progressing   Problem: Safety: Goal: Ability to remain free from injury will improve Outcome: Progressing   Problem: Skin Integrity: Goal: Risk for impaired skin integrity will decrease Outcome: Progressing   Problem: Clinical Measurements: Goal: Ability to maintain a body temperature in the normal range will improve Outcome: Progressing   Problem: Respiratory: Goal: Ability to maintain adequate ventilation will improve Outcome: Progressing Goal: Ability to maintain a clear airway will improve Outcome: Progressing   Problem: Coping: Goal: Level of anxiety will decrease Outcome: Not Progressing

## 2024-11-10 NOTE — Progress Notes (Signed)
   11/10/24 0146  Vitals  Temp 97.8 F (36.6 C)  Temp Source Oral  BP (!) 139/94  MAP (mmHg) 102  BP Location Left Arm  BP Method Automatic  Patient Position (if appropriate) Lying  Pulse Rate 85  Pulse Rate Source Monitor  ECG Heart Rate 89  Resp 19  MEWS COLOR  MEWS Score Color Green  Oxygen Therapy  SpO2 99 %  O2 Device Nasal Cannula  O2 Flow Rate (L/min) 2 L/min  MEWS Score  MEWS Temp 0  MEWS Systolic 0  MEWS Pulse 0  MEWS RR 0  MEWS LOC 0  MEWS Score 0   Notified by telemetry pt has been having 2 sec SVR pauses, Afib 60-70. Assessed pt, asymptomatic. Pt received dose of 25 mg metoprolol at 2124 and has Dilt gtt @ 15 mg/hr going now. Provider made aware. No new orders. Continue gtt at current rate. Writer placed pt on bedside MX machine.

## 2024-11-10 NOTE — Discharge Instructions (Signed)

## 2024-11-10 NOTE — Progress Notes (Signed)
 SATURATION QUALIFICATIONS: (This note is used to comply with regulatory documentation for home oxygen)  Patient Saturations on Room Air at Rest = 97%  Patient Saturations on Room Air while Ambulating = 95-97%  Patient ambulated with RN around unit without oxygen tank. Pt had no complaints of ShOB and did not need to take any rests during ambulation.  Willie Schmidt [name]

## 2024-11-10 NOTE — Progress Notes (Signed)
 Pt refusing cardiac monitoring. Will continue to encourage monitoring using a ambulatory device. Willie Schmidt

## 2024-11-10 NOTE — Progress Notes (Signed)
 PROGRESS NOTE    FARD BORUNDA  FMW:[id number] DOB: Jun 12, 1961 DOA: 11/07/2024 PCP: [name], MD    Brief Narrative:   Willie Schmidt is a 63 y.o. male with past medical history significant for HTN, chronic bronchitis, stage IV non-small cell lung cancer (not on treatment) who presented to [facility] ED on 11/07/2024 with complaints of progressive shortness of breath and chest pain over 1 week.  Associated with productive cough and wheezing.  Denies fever/chills, no hemoptysis, no palpitations, no orthopnea, no lower extremity edema, no abdominal pain, no nausea/vomiting/diarrhea, no melena/hematochezia, no urinary symptoms.  In the ED, temperature 90.4 F, HR 85, RR 20, BP 154/102, SpO2 92% on 2 L nasal count.  WBC 10.4, hemoglobin 12.8, platelet count 256.  Sodium 137, potassium 3.7, chloride 99, CO2 27, glucose 104, BUN 11, creatinine 0.71.  High sensitive troponin less than 15 x 2.  Lactic acid 0.8.  BNP less than 50.0.  Chest x-ray with left base consolidation and small effusion, vascular congestion and prominent cardiac silhouette.  CT angiogram chest with no evidence of pulm embolism, stable left upper lobe and left lower lobe linear scarring or atelectasis, 3 mm posterior lateral right middle lobe pulmonary nodule, stable ill-defined sclerotic focus within the posterior aspect of the T10 vertebral body, stable small chronic left pleural effusion, aortic sclerosis. Patient received azithromycin  500 mg IVPB, ceftriaxone  2 g IVPB, LR 1000 mL liter bolus followed by LR at 150 mL/h x 20 hours and morphine  2 mg IVP x 1 dose.  TRH was consulted and patient was transferred to [facility] for further evaluation management of acute hypoxemic respiratory failure, acute on chronic bronchitis with exacerbation.  Assessment & Plan:   Acute on chronic hypoxemic respiratory failure, POA Acute on chronic bronchitis with exacerbation Questionable pneumonia on imaging Patient presenting  with 1 week history of progressive shortness of breath, chest pain.  Patient is afebrile with WBC count 10.4.  BNP less than 15 x 2.  Lactic acid 0.8.  BNP less than 50.0.  Chest x-ray with left base consolidation.  CT angiogram chest with no evidence of pulmonary embolism.  Strep pneumo urinary antigen negative. -- Azithromycin  500 mg p.o. daily -- Ceftriaxone  2 g IV every 24 hours -- Solu-Medrol  40 mg IV q24h -- Albuterol  neb every 6 hours.  PRN wheezing/shortness of breath -- Incentive spirometry/flutter valve -- Wean from supplemental oxygen  Atrial fibrillation with RVR, new onset HTN Patient developed elevated heart rate evening of 11/09/2024, telemetry notable for A-fib with RVR.  No previous diagnosis.  Placed on Cardizem drip, now weaned off.  TTE with LVEF 55 to 6%, grade 1 diastolic dysfunction, RV systolic function low normal, trivial MR, no AR stenosis, IVC normal in size. -- Cardizem 90 mg p.o. every 6 hours; plan transition to Cardizem CD 360 mg tomorrow if heart remains stable overnight -- metoprolol tartrate 25mg  PO BID -- start Eliquis -- Outpatient referral to cardiology/A-fib clinic  Stage IV non-small cell lung cancer Follows with [facility] hematology/oncology, last visit 03/15/2024.  Not on active treatment.  Follow-up PET/CT has not demonstrated evidence of active or recurrent disease.  Continue outpatient follow-up with oncology, palliative care, pain management.  Obesity, class III Body mass index is 39.13 kg/m.    DVT prophylaxis: Lovenox , transition to Eliquis for new onset A-fib as above    Code Status: Full Code Family Communication: No family present at bedside  Disposition Plan:  Level of  care: Progressive Status is: Inpatient Remains inpatient appropriate because: Weaning from IV Cardizem  drip, IV antibiotics    Consultants:  None  Procedures:  TTE  Antimicrobials:  Azithromycin  11/16>> Ceftriaxone   11/16>>   Subjective: Patient seen examined bedside, lying in bed.  No specific complaints other than wanting to take a shower today.  Remains on IV Cardizem , discussed we will attempt to wean off after starting oral Cardizem  today.  No other questions or concerns at this time.  No family present.  Denies headache, no dizziness, no chest pain, no palpitations, no current shortness of breath, no abdominal pain, no fever/chills/night sweats, no nausea/vomiting/diarrhea, no focal weakness, no fatigue, no paresthesias.  No acute events overnight per nursing staff.  Objective: Vitals:   11/10/24 0430 11/10/24 0815 11/10/24 1134 11/10/24 1326  BP:    112/65  Pulse: 78   70  Resp: 15   18  Temp:    98.4 F (36.9 C)  TempSrc:    Oral  SpO2: 98% 97% 96% 98%  Weight:      Height:        Intake/Output Summary (Last 24 hours) at 11/10/2024 1554 Last data filed at 11/10/2024 1000 Gross per 24 hour  Intake 986.91 ml  Output --  Net 986.91 ml   Filed Weights   11/07/24 1904  Weight: 120.2 kg    Examination:  Physical Exam: GEN: NAD, alert and oriented x 3, obese HEENT: NCAT, PERRL, EOMI, sclera clear, MMM PULM: CTAB w/o wheezes/crackles, normal respiratory effort, on 2 L nasal cannula CV: RRR w/o M/G/R GI: abd soft, NTND, + BS MSK: no peripheral edema, muscle strength globally intact 5/5 bilateral upper/lower extremities NEURO: CN II-XII intact, no focal deficits, sensation to light touch intact PSYCH: normal mood/affect Integumentary: dry/intact, no rashes or wounds    Data Reviewed: I have personally reviewed following labs and imaging studies  CBC: Recent Labs  Lab 11/07/24 1904 11/08/24 1257 11/09/24 0551 11/10/24 0402  WBC 10.4 9.3 8.0 14.7*  HGB 12.8* 12.9* 13.4 13.2  HCT 39.7 41.6 44.5 42.2  MCV 91.9 95.2 97.2 93.8  PLT 256 223 248 285   Basic Metabolic Panel: Recent Labs  Lab 11/07/24 1904 11/08/24 1257 11/09/24 0551 11/10/24 0402  NA 137 139 142 138  K  3.7 4.1 4.3 4.4  CL 99 103 103 102  CO2 27 26 28 25   GLUCOSE 104* 101* 92 156*  BUN 11 6* 8 13  CREATININE 0.71 0.65 0.60* 0.74  CALCIUM 9.6 9.3 9.7 9.5  MG  --  2.4  --   --   PHOS  --  3.1  --   --    GFR: Estimated Creatinine Clearance: 121 mL/min (by C-G formula based on SCr of 0.74 mg/dL). Liver Function Tests: Recent Labs  Lab 11/08/24 1257 11/09/24 0551  AST 17 18  ALT 10 9  ALKPHOS 146* 147*  BILITOT 0.6 0.5  PROT 7.6 7.9  ALBUMIN 4.1 4.1   No results for input(s): LIPASE, AMYLASE in the last 168 hours. No results for input(s): AMMONIA in the last 168 hours. Coagulation Profile: No results for input(s): INR, PROTIME in the last 168 hours. Cardiac Enzymes: No results for input(s): CKTOTAL, CKMB, CKMBINDEX, TROPONINI in the last 168 hours. BNP (last 3 results) Recent Labs    11/07/24 2030  PROBNP <50.0   HbA1C: No results for input(s): HGBA1C in the last 72 hours. CBG: No results for input(s): GLUCAP in the last 168 hours. Lipid Profile:  No results for input(s): CHOL, HDL, LDLCALC, TRIG, CHOLHDL, LDLDIRECT in the last 72 hours. Thyroid  Function Tests: No results for input(s): TSH, T4TOTAL, FREET4, T3FREE, THYROIDAB in the last 72 hours. Anemia Panel: No results for input(s): VITAMINB12, FOLATE, FERRITIN, TIBC, IRON, RETICCTPCT in the last 72 hours. Sepsis Labs: Recent Labs  Lab 11/07/24 2116 11/08/24 1257  PROCALCITON  --  <0.10  LATICACIDVEN 0.8  --     Recent Results (from the past 240 hours)  Culture, blood (single)     Status: None (Preliminary result)   Collection Time: 11/07/24 10:56 PM   Specimen: BLOOD  Result Value Ref Range Status   Specimen Description   Final    BLOOD RIGHT ANTECUBITAL Performed at [facility], [address] [place], [place] [address]    Special Requests   Final    BOTTLES DRAWN AEROBIC AND ANAEROBIC Blood Culture adequate volume Performed  at [facility], [address] [place], [place] [address]    Culture   Final    NO GROWTH 2 DAYS Performed at [facility], [address], [place], [place] [address]    Report Status PENDING  Incomplete  Expectorated Sputum Assessment w Gram Stain, Rflx to Resp Cult     Status: None   Collection Time: 11/08/24  9:15 PM   Specimen: Expectorated Sputum  Result Value Ref Range Status   Specimen Description EXPECTORATED SPUTUM  Final   Special Requests NONE  Final   Sputum evaluation   Final    THIS SPECIMEN IS ACCEPTABLE FOR SPUTUM CULTURE Performed at [facility], [address], [place], [place] [address]    Report Status 11/08/2024 FINAL  Final  Culture, Respiratory w Gram Stain     Status: None (Preliminary result)   Collection Time: 11/08/24  9:15 PM  Result Value Ref Range Status   Specimen Description   Final    EXPECTORATED SPUTUM Performed at [facility], [address] [place], [place] [address]    Special Requests   Final    NONE Reflexed from [id number] Performed at [facility], [address] [place], [place] [address]    Gram Stain   Final    FEW WBC PRESENT,BOTH PMN AND MONONUCLEAR NO ORGANISMS SEEN    Culture   Final    CULTURE REINCUBATED FOR BETTER GROWTH Performed at [facility], [address], [place], [place] [address]    Report Status PENDING  Incomplete         Radiology Studies: ECHOCARDIOGRAM COMPLETE Result Date: 11/09/2024    ECHOCARDIOGRAM REPORT   Patient Name:   CAMILLE THAU Date of Exam: 11/09/2024 Medical Rec #:  [id number]     Height:       69.0 in Accession #:    [id number]    Weight:       265.0 lb Date of Birth:  April 07, 1961    BSA:          2.328 m Patient Age:    63 years      BP:           125/70 mmHg Patient Gender: M             HR:           84 bpm. Exam Location:  Inpatient Procedure: 2D Echo and Intracardiac Opacification Agent (Both  Spectral and Color            Flow  Doppler were utilized during procedure). Indications:    Abn EKG  History:        Patient has no prior history of Echocardiogram examinations.                 Abnormal ECG.  Sonographer:    Norleen Amour Referring Phys: [id number] DAVID MANUEL ORTIZ IMPRESSIONS  1. Poor acoustic windows limit study.  2. Left ventricular ejection fraction, by estimation, is 55 to 60%. The left ventricle has normal function. Left ventricular diastolic parameters are consistent with Grade I diastolic dysfunction (impaired relaxation).  3. Right ventricular systolic function is low normal. The right ventricular size is normal.  4. The mitral valve is normal in structure. Trivial mitral valve regurgitation.  5. The aortic valve is tricuspid. Aortic valve regurgitation is not visualized. Aortic valve sclerosis/calcification is present, without any evidence of aortic stenosis.  6. The inferior vena cava is normal in size with greater than 50% respiratory variability, suggesting right atrial pressure of 3 mmHg. FINDINGS  Left Ventricle: Left ventricular ejection fraction, by estimation, is 55 to 60%. The left ventricle has normal function. The left ventricular internal cavity size was normal in size. There is no left ventricular hypertrophy. Left ventricular diastolic parameters are consistent with Grade I diastolic dysfunction (impaired relaxation). Right Ventricle: The right ventricular size is normal. Right vetricular wall thickness was not assessed. Right ventricular systolic function is low normal. Left Atrium: Left atrial size was normal in size. Right Atrium: Right atrial size was normal in size. Pericardium: There is no evidence of pericardial effusion. Mitral Valve: The mitral valve is normal in structure. Trivial mitral valve regurgitation. MV peak gradient, 2.1 mmHg. The mean mitral valve gradient is 1.0 mmHg. Tricuspid Valve: The tricuspid valve is normal in structure. Tricuspid valve  regurgitation is trivial. Aortic Valve: The aortic valve is tricuspid. Aortic valve regurgitation is not visualized. Aortic valve sclerosis/calcification is present, without any evidence of aortic stenosis. Pulmonic Valve: The pulmonic valve was not well visualized. Aorta: The aortic root and ascending aorta are structurally normal, with no evidence of dilitation. Venous: The inferior vena cava is normal in size with greater than 50% respiratory variability, suggesting right atrial pressure of 3 mmHg. IAS/Shunts: No atrial level shunt detected by color flow Doppler.  LEFT VENTRICLE PLAX 2D LVIDd:         5.40 cm      Diastology LVIDs:         3.80 cm      LV e' medial:    10.40 cm/s LV PW:         1.00 cm      LV E/e' medial:  7.0 LV IVS:        0.90 cm      LV e' lateral:   8.81 cm/s LVOT diam:     2.20 cm      LV E/e' lateral: 8.2 LV SV:         74 LV SV Index:   32 LVOT Area:     3.80 cm LV IVRT:       98 msec  LV Volumes (MOD) LV vol d, MOD A2C: 128.0 ml LV vol d, MOD A4C: 166.0 ml LV vol s, MOD A2C: 46.3 ml LV vol s, MOD A4C: 74.2 ml LV SV MOD A2C:     81.7 ml LV SV MOD A4C:     166.0 ml LV SV MOD BP:      87.5 ml RIGHT VENTRICLE  IVC RV Basal diam:  2.70 cm     IVC diam: 2.00 cm RV S prime:     11.20 cm/s TAPSE (M-mode): 2.1 cm      PULMONARY VEINS                             Diastolic Velocity: 44.30 cm/s                             S/D Velocity:       0.50                             Systolic Velocity:  23.50 cm/s LEFT ATRIUM             Index        RIGHT ATRIUM           Index LA diam:        4.40 cm 1.89 cm/m   RA Area:     15.50 cm LA Vol (A2C):   72.3 ml 31.05 ml/m  RA Volume:   40.20 ml  17.26 ml/m LA Vol (A4C):   61.3 ml 26.33 ml/m LA Biplane Vol: 67.0 ml 28.77 ml/m  AORTIC VALVE             PULMONIC VALVE LVOT Vmax:   82.20 cm/s  PV Vmax:       0.72 m/s LVOT Vmean:  60.200 cm/s PV Peak grad:  2.1 mmHg LVOT VTI:    0.195 m  AORTA Ao Root diam: 3.30 cm Ao Asc diam:  3.50 cm MITRAL  VALVE MV Area (PHT): 4.10 cm    SHUNTS MV Area VTI:   4.01 cm    Systemic VTI:  0.20 m MV Peak grad:  2.1 mmHg    Systemic Diam: 2.20 cm MV Mean grad:  1.0 mmHg MV Vmax:       0.72 m/s MV Vmean:      51.6 cm/s MV Decel Time: 185 msec MV E velocity: 72.40 cm/s MV A velocity: 79.30 cm/s MV E/A ratio:  0.91 Vina Gull MD Electronically signed by Vina Gull MD Signature Date/Time: 11/09/2024/1:49:18 PM    Final         Scheduled Meds:  aspirin  EC  81 mg Oral Daily   azithromycin   500 mg Oral Daily   cyclobenzaprine   10 mg Oral TID   diltiazem  90 mg Oral Q6H   enoxaparin  (LOVENOX ) injection  60 mg Subcutaneous Q24H   famotidine   20 mg Oral Daily   fluocinonide ointment  1 Application Topical BID   folic acid   1 mg Oral Daily   gabapentin   300 mg Oral TID   guaiFENesin  600 mg Oral BID   ipratropium-albuterol   3 mL Nebulization QID   methylPREDNISolone  (SOLU-MEDROL ) injection  40 mg Intravenous Q12H   metoprolol tartrate  25 mg Oral BID   silver sulfADIAZINE  1 Application Topical BID   triamcinolone ointment  1 Application Topical BID   Continuous Infusions:  cefTRIAXone  (ROCEPHIN )  IV 2 g (11/09/24 2247)     LOS: 1 day    Time spent: 50 minutes spent on 11/10/2024 caring for this patient face-to-face including chart review, ordering labs/tests, documenting, discussion with nursing staff, consultants, updating family and interview/physical exam    Camellia PARAS Porsha Skilton, DO [facility] Available via Epic secure  chat 7am-7pm After these hours, please refer to coverage provider listed on [email or web address] 11/10/2024, 3:54 PM

## 2024-11-11 ENCOUNTER — Other Ambulatory Visit (HOSPITAL_COMMUNITY): Payer: Self-pay

## 2024-11-11 DIAGNOSIS — J9601 Acute respiratory failure with hypoxia: Secondary | ICD-10-CM | POA: Diagnosis not present

## 2024-11-11 LAB — BASIC METABOLIC PANEL WITH GFR
Anion gap: 11 (ref 5–15)
BUN: 13 mg/dL (ref 8–23)
CO2: 26 mmol/L (ref 22–32)
Calcium: 9.3 mg/dL (ref 8.9–10.3)
Chloride: 103 mmol/L (ref 98–111)
Creatinine, Ser: 0.71 mg/dL (ref 0.61–1.24)
GFR, Estimated: >60 mL/min (ref >60)
Glucose, Bld: 123 mg/dL — ABNORMAL HIGH (ref 70–99)
Potassium: 3.9 mmol/L (ref 3.5–5.1)
Sodium: 140 mmol/L (ref 135–145)

## 2024-11-11 LAB — CBC
HCT: 40.1 % (ref 39.0–52.0)
Hemoglobin: 12.4 g/dL — ABNORMAL LOW (ref 13.0–17.0)
MCH: 29.3 pg (ref 26.0–34.0)
MCHC: 30.9 g/dL (ref 30.0–36.0)
MCV: 94.8 fL (ref 80.0–100.0)
Platelets: 275 K/uL (ref 150–400)
RBC: 4.23 MIL/uL (ref 4.22–5.81)
RDW: 13.9 % (ref 11.5–15.5)
WBC: 16.7 K/uL — ABNORMAL HIGH (ref 4.0–10.5)
nRBC: 0 % (ref 0.0–0.2)

## 2024-11-11 LAB — CULTURE, RESPIRATORY W GRAM STAIN: Culture: NORMAL

## 2024-11-11 MED ORDER — DILTIAZEM HCL ER COATED BEADS 180 MG PO CP24
360.0000 mg | ORAL_CAPSULE | Freq: Every day | ORAL | Status: DC
  Administered 2024-11-11: 360 mg via ORAL
  Filled 2024-11-11: qty 2

## 2024-11-11 MED ORDER — DILTIAZEM HCL ER COATED BEADS 360 MG PO CP24: 360.0000 mg | ORAL_CAPSULE | Freq: Every day | ORAL | 0 refills | Status: DC

## 2024-11-11 MED ORDER — FUROSEMIDE 40 MG PO TABS: 40.0000 mg | ORAL_TABLET | Freq: Every day | ORAL | 0 refills | Status: DC

## 2024-11-11 MED ORDER — METOPROLOL TARTRATE 25 MG PO TABS: 25.0000 mg | ORAL_TABLET | Freq: Two times a day (BID) | ORAL | 0 refills | Status: DC

## 2024-11-11 MED ORDER — PREDNISONE 10 MG PO TABS: ORAL_TABLET | ORAL | 0 refills | Status: AC

## 2024-11-11 MED ORDER — APIXABAN 5 MG PO TABS: 5.0000 mg | ORAL_TABLET | Freq: Two times a day (BID) | ORAL | 0 refills | Status: DC

## 2024-11-11 MED ORDER — ALBUTEROL SULFATE HFA 108 (90 BASE) MCG/ACT IN AERS: 2.0000 | INHALATION_SPRAY | Freq: Four times a day (QID) | RESPIRATORY_TRACT | 2 refills | Status: AC | PRN

## 2024-11-11 MED ORDER — CEFADROXIL 500 MG PO CAPS: 500.0000 mg | ORAL_CAPSULE | Freq: Two times a day (BID) | ORAL | 0 refills | Status: AC

## 2024-11-11 NOTE — TOC Progression Note (Signed)
 Transition of Care [facility]) - Progression Note   Patient Details  Name: HIPOLITO MARTINEZLOPEZ MRN: [id number] Date of Birth: 09/19/1961  Transition [facility]) CM/SW Contact  Duwaine GORMAN Aran, LCSW Phone Number: 11/11/2024, 8:45 AM  Clinical Narrative: Patient has been weaned off of oxygen, so no DME needs are expected at this time.  Expected Discharge Plan: Home/Self Care Barriers to Discharge: Continued Medical Work up  Expected Discharge Plan and Services In-house Referral: Clinical Social Work Post Acute Care Choice: NA Living arrangements for the past 2 months: Single Family Home           DME Arranged: N/A DME Agency: NA  Social Drivers of Health (SDOH) Interventions SDOH Screenings   Food Insecurity: No Food Insecurity (11/08/2024)  Housing: Low Risk  (11/08/2024)  Transportation Needs: No Transportation Needs (11/08/2024)  Utilities: Not At Risk (11/08/2024)  Social Connections: Socially Integrated (11/08/2024)  Tobacco Use: Medium Risk (11/07/2024)   Readmission Risk Interventions    11/10/2024    9:16 AM  Readmission Risk Prevention Plan  Transportation Screening Complete  HRI or Home Care Consult Complete  Social Work Consult for Recovery Care Planning/Counseling Complete  Palliative Care Screening Not Applicable  Medication Review Oceanographer) Complete

## 2024-11-11 NOTE — Discharge Summary (Signed)
 Physician Discharge Summary  [name] Schmidt:[id number] DOB: 12-04-1961 DOA: 11/07/2024  PCP: [name], MD  Admit date: 11/07/2024 Discharge date: 11/11/2024  Admitted From: Home Disposition: Home  Recommendations for Outpatient Follow-up:  Follow up with PCP in 1-2 weeks Referral placed to A-fib clinic for new onset A-fib Started on diltiazem 360 mg p.o. daily, metoprolol tartrate 25 mg p.o. twice daily, Eliquis 5 g p.o. twice daily for new onset A-fib Discontinue aspirin  as now started on Eliquis. Continue antibiotics for 1 additional day to complete course for acute bronchitis/pneumonia Continue prednisone  taper  Home Health: No Equipment/Devices: None  Discharge Condition: Stable CODE STATUS: Full code Diet recommendation: Heart healthy diet  History of present illness:  Willie Schmidt is a 63 y.o. male with past medical history significant for HTN, chronic bronchitis, stage IV non-small cell lung cancer (not on treatment) who presented to [facility] ED on 11/07/2024 with complaints of progressive shortness of breath and chest pain over 1 week.  Associated with productive cough and wheezing.  Denies fever/chills, no hemoptysis, no palpitations, no orthopnea, no lower extremity edema, no abdominal pain, no nausea/vomiting/diarrhea, no melena/hematochezia, no urinary symptoms.   In the ED, temperature 90.4 F, HR 85, RR 20, BP 154/102, SpO2 92% on 2 L nasal count.  WBC 10.4, hemoglobin 12.8, platelet count 256.  Sodium 137, potassium 3.7, chloride 99, CO2 27, glucose 104, BUN 11, creatinine 0.71.  High sensitive troponin less than 15 x 2.  Lactic acid 0.8.  BNP less than 50.0.  Chest x-ray with left base consolidation and small effusion, vascular congestion and prominent cardiac silhouette.  CT angiogram chest with no evidence of pulm embolism, stable left upper lobe and left lower lobe linear scarring or atelectasis, 3 mm posterior lateral right middle lobe pulmonary  nodule, stable ill-defined sclerotic focus within the posterior aspect of the T10 vertebral body, stable small chronic left pleural effusion, aortic sclerosis. Patient received azithromycin  500 mg IVPB, ceftriaxone  2 g IVPB, LR 1000 mL liter bolus followed by LR at 150 mL/h x 20 hours and morphine  2 mg IVP x 1 dose.  TRH was consulted and patient was transferred to [facility] for further evaluation management of acute hypoxemic respiratory failure, acute on chronic bronchitis with exacerbation.  Hospital course:  Acute on chronic hypoxemic respiratory failure, POA Acute on chronic bronchitis with exacerbation Questionable pneumonia on imaging Patient presenting with 1 week history of progressive shortness of breath, chest pain.  Patient is afebrile with WBC count 10.4.  BNP less than 15 x 2.  Lactic acid 0.8.  BNP less than 50.0.  Chest x-ray with left base consolidation.  CT angiogram chest with no evidence of pulmonary embolism.  Strep pneumo urinary antigen negative.  Patient completed 5-day course of azithromycin  while inpatient and will continue 1 additional day of cefadroxil on discharge to complete antibiotic course.  Prednisone  taper.  Outpatient follow-up with PCP.   Atrial fibrillation with RVR, new onset HTN Patient developed elevated heart rate evening of 11/09/2024, telemetry notable for A-fib with RVR.  No previous diagnosis.  Placed on Cardizem drip, now weaned off.  TTE with LVEF 55 to 6%, grade 1 diastolic dysfunction, RV systolic function low normal, trivial MR, no AR stenosis, IVC normal in size.  Initially placed on Cardizem drip and transition to Cardizem CD 360 mg p.o. daily, metoprolol tartrate 25 mg p.o. twice daily and started on Eliquis.  Continue furosemide  40 mg p.o. daily.  Outpatient referral placed to cardiology/A-fib  clinic.   Stage IV non-small cell lung cancer Follows with [facility] hematology/oncology, last visit 03/15/2024.   Not on active treatment.  Follow-up PET/CT has not demonstrated evidence of active or recurrent disease.  Continue outpatient follow-up with oncology, palliative care, pain management.   Obesity, class III Body mass index is 39.13 kg/m.   Discharge Diagnoses:  Principal Problem:   Acute hypoxemic respiratory failure (HCC) Active Problems:   Adenocarcinoma of left lung, stage 4 (HCC)   CAP (community acquired pneumonia)   Elevated alkaline phosphatase level   Normocytic anemia   Class 2 obesity   Pneumonia    Discharge Instructions  Discharge Instructions     Amb Referral to AFIB Clinic   Complete by: As directed    Call MD for:  difficulty breathing, headache or visual disturbances   Complete by: As directed    Call MD for:  extreme fatigue   Complete by: As directed    Call MD for:  persistant dizziness or light-headedness   Complete by: As directed    Call MD for:  persistant nausea and vomiting   Complete by: As directed    Call MD for:  severe uncontrolled pain   Complete by: As directed    Call MD for:  temperature >100.4   Complete by: As directed    Diet - low sodium heart healthy   Complete by: As directed    Increase activity slowly   Complete by: As directed       Allergies as of 11/11/2024   No Known Allergies      Medication List     STOP taking these medications    aspirin  EC 81 MG tablet   lisinopril -hydrochlorothiazide  20-12.5 MG tablet Commonly known as: ZESTORETIC    lisinopril -hydrochlorothiazide  20-25 MG tablet Commonly known as: ZESTORETIC    oxyCODONE  5 MG immediate release tablet Commonly known as: Roxicodone        TAKE these medications    albuterol  (2.5 MG/3ML) 0.083% nebulizer solution Commonly known as: PROVENTIL  Take 2.5 mg by nebulization every 6 (six) hours as needed for wheezing or shortness of breath. What changed: Another medication with the same name was changed. Make sure you understand how and when to take  each.   albuterol  108 (90 Base) MCG/ACT inhaler Commonly known as: VENTOLIN  HFA Inhale 2 puffs into the lungs every 6 (six) hours as needed for wheezing or shortness of breath. What changed: reasons to take this   apixaban  5 MG Tabs tablet Commonly known as: ELIQUIS  Take 1 tablet (5 mg total) by mouth 2 (two) times daily.   cefadroxil  500 MG capsule Commonly known as: DURICEF Take 1 capsule (500 mg total) by mouth 2 (two) times daily for 1 day.   clobetasol ointment 0.05 % Commonly known as: TEMOVATE Apply 1 Application topically 2 (two) times daily.   cyclobenzaprine  10 MG tablet Commonly known as: FLEXERIL  Take 10 mg by mouth 3 (three) times daily as needed for muscle spasms.   diclofenac Sodium 1 % Gel Commonly known as: VOLTAREN Apply 1 g topically daily as needed (pain).   diltiazem  360 MG 24 hr capsule Commonly known as: CARDIZEM  CD Take 1 capsule (360 mg total) by mouth daily. Start taking on: November 12, 2024   diphenhydrAMINE  25 mg capsule Commonly known as: BENADRYL  Take 25 mg by mouth every 6 (six) hours as needed for itching.   DULoxetine  60 MG capsule Commonly known as: CYMBALTA  Take 60 mg by  mouth 2 (two) times daily.   famotidine  20 MG tablet Commonly known as: PEPCID  Take 20 mg by mouth daily.   fluocinonide ointment 0.05 % Commonly known as: LIDEX Apply 1 Application topically in the morning and at bedtime.   folic acid  1 MG tablet Commonly known as: FOLVITE  Take 1 tablet by mouth daily.   furosemide  40 MG tablet Commonly known as: LASIX  Take 1 tablet (40 mg total) by mouth daily. What changed:  how much to take when to take this   gabapentin  300 MG capsule Commonly known as: NEURONTIN  Take 1 capsule (300 mg total) by mouth 3 (three) times daily. What changed: when to take this   methocarbamol  500 MG tablet Commonly known as: ROBAXIN  Take 1 tablet (500 mg total) by mouth 2 (two) times daily. What changed:  when to take  this reasons to take this   methotrexate 2.5 MG tablet Commonly known as: RHEUMATREX Take 10 mg by mouth once a week.   metolazone  5 MG tablet Commonly known as: ZAROXOLYN  Take 5 mg by mouth daily as needed (severe swelling).   metoprolol tartrate 25 MG tablet Commonly known as: LOPRESSOR Take 1 tablet (25 mg total) by mouth 2 (two) times daily.   naloxone 4 MG/0.1ML Liqd nasal spray kit Commonly known as: NARCAN Place 0.4 mg into the nose as needed (OD).   ondansetron  4 MG disintegrating tablet Commonly known as: ZOFRAN -ODT Take 4 mg by mouth daily as needed for nausea or vomiting.   oxyCODONE -acetaminophen  10-325 MG tablet Commonly known as: PERCOCET Take 1 tablet by mouth 3 (three) times daily. What changed: Another medication with the same name was removed. Continue taking this medication, and follow the directions you see here.   polyethylene glycol 17 g packet Commonly known as: MIRALAX  / GLYCOLAX  Take 17 g by mouth daily as needed for mild constipation.   predniSONE  10 MG tablet Commonly known as: DELTASONE  Take 4 tablets (40 mg total) by mouth daily for 2 days, THEN 3 tablets (30 mg total) daily for 2 days, THEN 2 tablets (20 mg total) daily for 2 days, THEN 1 tablet (10 mg total) daily for 2 days. Start taking on: November 12, 2024   sennosides-docusate sodium  8.6-50 MG tablet Commonly known as: SENOKOT-S Take 1 tablet by mouth daily as needed for constipation.   silver sulfADIAZINE 1 % cream Commonly known as: SILVADENE Apply 1 Application topically 2 (two) times daily.   triamcinolone ointment 0.1 % Commonly known as: KENALOG Apply 1 Application topically 2 (two) times daily.        Follow-up Information     [name], MD. Schedule an appointment as soon as possible for a visit in 1 week(s).   Specialty: Family Medicine Contact information: [address] [place] [address] [phone]         [facility] at [facility] Dept of The  [employer] [facility] [employer]. Schedule an appointment as soon as possible for a visit.   Specialty: Cardiology Why: New onset atrial fibrillation Contact information: [address] [place]  [address] [phone]               No Known Allergies  Consultations: None   Procedures/Studies: ECHOCARDIOGRAM COMPLETE Result Date: 11/09/2024    ECHOCARDIOGRAM REPORT   Patient Name:   Willie Schmidt Date of Exam: 11/09/2024 Medical Rec #:  [id number]     Height:       69.0 in Accession #:  [id number]    Weight:       265.0 lb Date of Birth:  11/04/1961    BSA:          2.328 m Patient Age:    63 years      BP:           125/70 mmHg Patient Gender: M             HR:           84 bpm. Exam Location:  Inpatient Procedure: 2D Echo and Intracardiac Opacification Agent (Both Spectral and Color            Flow Doppler were utilized during procedure). Indications:    Abn EKG  History:        Patient has no prior history of Echocardiogram examinations.                 Abnormal ECG.  Sonographer:    Norleen Amour Referring Phys: [id number] DAVID MANUEL ORTIZ IMPRESSIONS  1. Poor acoustic windows limit study.  2. Left ventricular ejection fraction, by estimation, is 55 to 60%. The left ventricle has normal function. Left ventricular diastolic parameters are consistent with Grade I diastolic dysfunction (impaired relaxation).  3. Right ventricular systolic function is low normal. The right ventricular size is normal.  4. The mitral valve is normal in structure. Trivial mitral valve regurgitation.  5. The aortic valve is tricuspid. Aortic valve regurgitation is not visualized. Aortic valve sclerosis/calcification is present, without any evidence of aortic stenosis.  6. The inferior vena cava is normal in size with greater than 50% respiratory variability, suggesting right atrial pressure of 3 mmHg. FINDINGS  Left Ventricle: Left ventricular ejection fraction, by estimation, is 55 to 60%. The left  ventricle has normal function. The left ventricular internal cavity size was normal in size. There is no left ventricular hypertrophy. Left ventricular diastolic parameters are consistent with Grade I diastolic dysfunction (impaired relaxation). Right Ventricle: The right ventricular size is normal. Right vetricular wall thickness was not assessed. Right ventricular systolic function is low normal. Left Atrium: Left atrial size was normal in size. Right Atrium: Right atrial size was normal in size. Pericardium: There is no evidence of pericardial effusion. Mitral Valve: The mitral valve is normal in structure. Trivial mitral valve regurgitation. MV peak gradient, 2.1 mmHg. The mean mitral valve gradient is 1.0 mmHg. Tricuspid Valve: The tricuspid valve is normal in structure. Tricuspid valve regurgitation is trivial. Aortic Valve: The aortic valve is tricuspid. Aortic valve regurgitation is not visualized. Aortic valve sclerosis/calcification is present, without any evidence of aortic stenosis. Pulmonic Valve: The pulmonic valve was not well visualized. Aorta: The aortic root and ascending aorta are structurally normal, with no evidence of dilitation. Venous: The inferior vena cava is normal in size with greater than 50% respiratory variability, suggesting right atrial pressure of 3 mmHg. IAS/Shunts: No atrial level shunt detected by color flow Doppler.  LEFT VENTRICLE PLAX 2D LVIDd:         5.40 cm      Diastology LVIDs:         3.80 cm      LV e' medial:    10.40 cm/s LV PW:         1.00 cm      LV E/e' medial:  7.0 LV IVS:        0.90 cm      LV e' lateral:   8.81 cm/s LVOT diam:  2.20 cm      LV E/e' lateral: 8.2 LV SV:         74 LV SV Index:   32 LVOT Area:     3.80 cm LV IVRT:       98 msec  LV Volumes (MOD) LV vol d, MOD A2C: 128.0 ml LV vol d, MOD A4C: 166.0 ml LV vol s, MOD A2C: 46.3 ml LV vol s, MOD A4C: 74.2 ml LV SV MOD A2C:     81.7 ml LV SV MOD A4C:     166.0 ml LV SV MOD BP:      87.5 ml RIGHT  VENTRICLE             IVC RV Basal diam:  2.70 cm     IVC diam: 2.00 cm RV S prime:     11.20 cm/s TAPSE (M-mode): 2.1 cm      PULMONARY VEINS                             Diastolic Velocity: 44.30 cm/s                             S/D Velocity:       0.50                             Systolic Velocity:  23.50 cm/s LEFT ATRIUM             Index        RIGHT ATRIUM           Index LA diam:        4.40 cm 1.89 cm/m   RA Area:     15.50 cm LA Vol (A2C):   72.3 ml 31.05 ml/m  RA Volume:   40.20 ml  17.26 ml/m LA Vol (A4C):   61.3 ml 26.33 ml/m LA Biplane Vol: 67.0 ml 28.77 ml/m  AORTIC VALVE             PULMONIC VALVE LVOT Vmax:   82.20 cm/s  PV Vmax:       0.72 m/s LVOT Vmean:  60.200 cm/s PV Peak grad:  2.1 mmHg LVOT VTI:    0.195 m  AORTA Ao Root diam: 3.30 cm Ao Asc diam:  3.50 cm MITRAL VALVE MV Area (PHT): 4.10 cm    SHUNTS MV Area VTI:   4.01 cm    Systemic VTI:  0.20 m MV Peak grad:  2.1 mmHg    Systemic Diam: 2.20 cm MV Mean grad:  1.0 mmHg MV Vmax:       0.72 m/s MV Vmean:      51.6 cm/s MV Decel Time: 185 msec MV E velocity: 72.40 cm/s MV A velocity: 79.30 cm/s MV E/A ratio:  0.91 Vina Gull MD Electronically signed by Vina Gull MD Signature Date/Time: 11/09/2024/1:49:18 PM    Final    CT Angio Chest PE W and/or Wo Contrast Result Date: 11/07/2024 CLINICAL DATA:  Worsening shortness of breath and chest pain x1 month. EXAM: CT ANGIOGRAPHY CHEST WITH CONTRAST TECHNIQUE: Multidetector CT imaging of the chest was performed using the standard protocol during bolus administration of intravenous contrast. Multiplanar CT image reconstructions and MIPs were obtained to evaluate the vascular anatomy. RADIATION DOSE REDUCTION: This exam was performed according to the departmental dose-optimization program which includes automated  exposure control, adjustment of the mA and/or kV according to patient size and/or use of iterative reconstruction technique. CONTRAST:  75mL OMNIPAQUE  IOHEXOL  350 MG/ML SOLN  COMPARISON:  September 16, 2023 FINDINGS: Cardiovascular: There is moderate severity calcification of the aortic arch, without evidence of aortic aneurysm or dissection. Satisfactory opacification of the pulmonary arteries to the segmental level. No evidence of pulmonary embolism. Normal heart size with mild coronary artery calcification. No pericardial effusion. Mediastinum/Nodes: A 2.3 cm x 1.7 cm ill-defined area soft tissue attenuation is seen within the AP window. This measured 2.2 cm x 2.2 cm on the prior study. Stable peribronchial thickening is seen along the left hilum. Thyroid  gland, trachea, and esophagus demonstrate no significant findings. Lungs/Pleura: Stable mild to moderate severity left upper lobe and left lower lobe linear scarring and/or atelectasis is seen with stable left-sided volume loss. A stable area of suspected round atelectasis with calcification is noted within the left lung base. A 3 mm pleural based pulmonary nodule is seen within the posterolateral aspect of the right middle lobe (axial CT image 79, CT series 6). This is not clearly identified on the prior study. There is a small, stable, chronic left pleural effusion. No pneumothorax is identified. Upper Abdomen: No acute abnormality. Musculoskeletal: A stable, ill-defined sclerotic focus is seen within the posterior aspect of the T10 vertebral body. Review of the MIP images confirms the above findings. IMPRESSION: 1. No evidence of pulmonary embolism. 2. Stable left upper lobe and left lower lobe linear scarring and/or atelectasis with a stable area of suspected round atelectasis within the left lung base. 3. 3 mm posterolateral right middle lobe pulmonary nodule. No follow-up needed if patient is low-risk.This recommendation follows the consensus statement: Guidelines for Management of Incidental Pulmonary Nodules Detected on CT Images: From the Fleischner Society 2017; Radiology 2017; 284:228-243. 4. Stable, ill-defined sclerotic  focus within the posterior aspect of the T10 vertebral body. 5. Stable small, chronic left pleural effusion. 6. Aortic atherosclerosis. Electronically Signed   By: Suzen Dials M.D.   On: 11/07/2024 21:10   DG Chest Port 1 View Result Date: 11/07/2024 EXAM: 1 VIEW(S) XRAY OF THE CHEST 11/07/2024 07:17:00 PM COMPARISON: 09/16/2023. CLINICAL HISTORY: Chest pain. FINDINGS: LUNGS AND PLEURA: Left base consolidation. Small left pleural effusion. No pneumothorax. HEART AND MEDIASTINUM: Vascular congestion. Prominent cardiac silhouette. BONES AND SOFT TISSUES: No acute osseous abnormality. IMPRESSION: 1. Left base consolidation and small effusion. 2. Vascular congestion and prominent cardiac silhouette. Electronically signed by: Fonda Field MD 11/07/2024 07:28 PM EST RP Workstation: [id number]     Subjective: Patient seen examined bedside, lying in bed.  Patient's significant other present at bedside.  No complaints this morning.  Ready for discharge home.  Discussed referral to cardiology/A-fib clinic and new medicines he will be taking.  No other questions or concerns at this time.  Denies headache, no dizziness, no chest pain, no palpitations, no shortness of breath, no abdominal pain, no fever/chills/night sweats, no nausea/vomiting/diarrhea, no focal weakness, no fatigue, no paresthesias.  No acute events overnight per nursing staff.  Discharge Exam: Vitals:   11/11/24 0436 11/11/24 0747  BP: 126/79   Pulse: 83   Resp: (!) 24   Temp: 97.7 F (36.5 C)   SpO2: 96% 93%   Vitals:   11/10/24 2003 11/10/24 2013 11/11/24 0436 11/11/24 0747  BP: 123/85  126/79   Pulse: (!) 106  83   Resp: 20  (!) 24   Temp: 98.4 F (36.9 C)  97.7 F (  36.5 C)   TempSrc: Oral  Oral   SpO2: 96% 96% 96% 93%  Weight:      Height:        Physical Exam: GEN: NAD, alert and oriented x 3, obese HEENT: NCAT, PERRL, EOMI, sclera clear, MMM PULM: CTAB w/o wheezes/crackles, normal respiratory effort, on room  air with SpO2 93% at rest. CV: RRR w/o M/G/R GI: abd soft, NTND, + BS MSK: no peripheral edema, muscle strength globally intact 5/5 bilateral upper/lower extremities NEURO: CN II-XII intact, no focal deficits, sensation to light touch intact PSYCH: normal mood/affect Integumentary: dry/intact, no rashes or wounds    The results of significant diagnostics from this hospitalization (including imaging, microbiology, ancillary and laboratory) are listed below for reference.     Microbiology: Recent Results (from the past 240 hours)  Culture, blood (single)     Status: None (Preliminary result)   Collection Time: 11/07/24 10:56 PM   Specimen: BLOOD  Result Value Ref Range Status   Specimen Description   Final    BLOOD RIGHT ANTECUBITAL Performed at [facility], [address], [place], [place] [address]    Special Requests   Final    BOTTLES DRAWN AEROBIC AND ANAEROBIC Blood Culture adequate volume Performed at [facility], [address] [place], [place] [address]    Culture   Final    NO GROWTH 3 DAYS Performed at [facility], [address], [place], [place] [address]    Report Status PENDING  Incomplete  Expectorated Sputum Assessment w Gram Stain, Rflx to Resp Cult     Status: None   Collection Time: 11/08/24  9:15 PM   Specimen: Expectorated Sputum  Result Value Ref Range Status   Specimen Description EXPECTORATED SPUTUM  Final   Special Requests NONE  Final   Sputum evaluation   Final    THIS SPECIMEN IS ACCEPTABLE FOR SPUTUM CULTURE Performed at [facility], [address], [place], [place] [address]    Report Status 11/08/2024 FINAL  Final  Culture, Respiratory w Gram Stain     Status: None (Preliminary result)   Collection Time: 11/08/24  9:15 PM  Result Value Ref Range Status   Specimen Description   Final    EXPECTORATED SPUTUM Performed at [facility], [address] [place], [place] [address]    Special Requests   Final    NONE Reflexed from [id number] Performed at [facility], [address] [place], [place] [address]    Gram Stain   Final    FEW WBC PRESENT,BOTH PMN AND MONONUCLEAR NO ORGANISMS SEEN    Culture   Final    CULTURE REINCUBATED FOR BETTER GROWTH Performed at [facility], [address] [place], [place] [address]    Report Status PENDING  Incomplete     Labs: BNP (last 3 results) No results for input(s): BNP in the last 8760 hours. Basic Metabolic Panel: Recent Labs  Lab 11/07/24 1904 11/08/24 1257 11/09/24 0551 11/10/24 0402 11/11/24 0425  NA 137 139 142 138 140  K 3.7 4.1 4.3 4.4 3.9  CL 99 103 103 102 103  CO2 27 26 28 25 26   GLUCOSE 104* 101* 92 156* 123*  BUN 11 6* 8 13 13   CREATININE 0.71 0.65 0.60* 0.74 0.71  CALCIUM 9.6 9.3 9.7 9.5 9.3  MG  --  2.4  --   --   --   PHOS  --  3.1  --   --   --    Liver Function Tests: Recent Labs  Lab 11/08/24 1257 11/09/24 0551  AST 17 18  ALT 10 9  ALKPHOS 146* 147*  BILITOT 0.6 0.5  PROT 7.6 7.9  ALBUMIN 4.1 4.1   No results for input(s): LIPASE, AMYLASE in the last 168 hours. No results for input(s): AMMONIA in the last 168 hours. CBC: Recent Labs  Lab 11/07/24 1904 11/08/24 1257 11/09/24 0551 11/10/24 0402 11/11/24 0425  WBC 10.4 9.3 8.0 14.7* 16.7*  HGB 12.8* 12.9* 13.4 13.2 12.4*  HCT 39.7 41.6 44.5 42.2 40.1  MCV 91.9 95.2 97.2 93.8 94.8  PLT 256 223 248 285 275   Cardiac Enzymes: No results for input(s): CKTOTAL, CKMB, CKMBINDEX, TROPONINI in the last 168 hours. BNP: Invalid input(s): POCBNP CBG: No results for input(s): GLUCAP in the last 168 hours. D-Dimer No results for input(s): DDIMER in the last 72 hours. Hgb A1c No results for input(s): HGBA1C in the last 72 hours. Lipid Profile No results for input(s): CHOL, HDL, LDLCALC, TRIG, CHOLHDL, LDLDIRECT in the last 72  hours. Thyroid  function studies No results for input(s): TSH, T4TOTAL, T3FREE, THYROIDAB in the last 72 hours.  Invalid input(s): FREET3 Anemia work up No results for input(s): VITAMINB12, FOLATE, FERRITIN, TIBC, IRON, RETICCTPCT in the last 72 hours. Urinalysis    Component Value Date/Time   COLORURINE YELLOW 04/10/2024 1053   APPEARANCEUR CLEAR 04/10/2024 1053   LABSPEC 1.015 04/10/2024 1053   PHURINE 6.0 04/10/2024 1053   GLUCOSEU NEGATIVE 04/10/2024 1053   HGBUR NEGATIVE 04/10/2024 1053   BILIRUBINUR NEGATIVE 04/10/2024 1053   KETONESUR NEGATIVE 04/10/2024 1053   PROTEINUR NEGATIVE 04/10/2024 1053   UROBILINOGEN 1.0 05/08/2010 1229   NITRITE NEGATIVE 04/10/2024 1053   LEUKOCYTESUR NEGATIVE 04/10/2024 1053   Sepsis Labs Recent Labs  Lab 11/08/24 1257 11/09/24 0551 11/10/24 0402 11/11/24 0425  WBC 9.3 8.0 14.7* 16.7*   Microbiology Recent Results (from the past 240 hours)  Culture, blood (single)     Status: None (Preliminary result)   Collection Time: 11/07/24 10:56 PM   Specimen: BLOOD  Result Value Ref Range Status   Specimen Description   Final    BLOOD RIGHT ANTECUBITAL Performed at [facility], [address] [place], [place] [address]    Special Requests   Final    BOTTLES DRAWN AEROBIC AND ANAEROBIC Blood Culture adequate volume Performed at [facility], [address] [place], [place] [address]    Culture   Final    NO GROWTH 3 DAYS Performed at [facility], [address] [place], [place] [address]    Report Status PENDING  Incomplete  Expectorated Sputum Assessment w Gram Stain, Rflx to Resp Cult     Status: None   Collection Time: 11/08/24  9:15 PM   Specimen: Expectorated Sputum  Result Value Ref Range Status   Specimen Description EXPECTORATED SPUTUM  Final   Special Requests NONE  Final   Sputum evaluation   Final    THIS SPECIMEN IS ACCEPTABLE FOR SPUTUM  CULTURE Performed at [facility], [address] [place], [place] [address]    Report Status 11/08/2024 FINAL  Final  Culture, Respiratory w Gram Stain     Status: None (Preliminary result)   Collection Time: 11/08/24  9:15 PM  Result Value Ref Range Status   Specimen Description   Final    EXPECTORATED SPUTUM Performed at [facility],  [address] [place], [place] [address]    Special Requests   Final    NONE Reflexed from [id number] Performed at [facility], [address], [place], [place] [address]    Gram Stain   Final    FEW WBC PRESENT,BOTH PMN AND MONONUCLEAR NO ORGANISMS SEEN    Culture   Final    CULTURE REINCUBATED FOR BETTER GROWTH Performed at [facility], [address] [place], [place] [address]    Report Status PENDING  Incomplete     Time coordinating discharge: Over 30 minutes  SIGNED:   Camellia PARAS Vernel Donlan, DO  [facility] 11/11/2024, 10:30 AM

## 2024-11-11 NOTE — Plan of Care (Signed)
   Problem: Education: Goal: Knowledge of General Education information will improve Description: Including pain rating scale, medication(s)/side effects and non-pharmacologic comfort measures Outcome: Completed/Met   Problem: Health Behavior/Discharge Planning: Goal: Ability to manage health-related needs will improve Outcome: Completed/Met   Problem: Clinical Measurements: Goal: Ability to maintain clinical measurements within normal limits will improve Outcome: Completed/Met Goal: Will remain free from infection Outcome: Completed/Met Goal: Diagnostic test results will improve Outcome: Completed/Met Goal: Respiratory complications will improve Outcome: Completed/Met Goal: Cardiovascular complication will be avoided Outcome: Completed/Met   Problem: Activity: Goal: Risk for activity intolerance will decrease Outcome: Completed/Met   Problem: Nutrition: Goal: Adequate nutrition will be maintained Outcome: Completed/Met   Problem: Coping: Goal: Level of anxiety will decrease Outcome: Completed/Met   Problem: Elimination: Goal: Will not experience complications related to bowel motility Outcome: Completed/Met Goal: Will not experience complications related to urinary retention Outcome: Completed/Met   Problem: Pain Managment: Goal: General experience of comfort will improve and/or be controlled Outcome: Completed/Met   Problem: Safety: Goal: Ability to remain free from injury will improve Outcome: Completed/Met   Problem: Skin Integrity: Goal: Risk for impaired skin integrity will decrease Outcome: Completed/Met   Problem: Activity: Goal: Ability to tolerate increased activity will improve Outcome: Completed/Met   Problem: Clinical Measurements: Goal: Ability to maintain a body temperature in the normal range will improve Outcome: Completed/Met   Problem: Respiratory: Goal: Ability to maintain adequate ventilation will improve Outcome: Completed/Met Goal:  Ability to maintain a clear airway will improve Outcome: Completed/Met

## 2024-11-13 LAB — CULTURE, BLOOD (SINGLE)
Culture: NO GROWTH
Special Requests: ADEQUATE

## 2024-11-30 ENCOUNTER — Encounter (HOSPITAL_COMMUNITY): Payer: Self-pay | Admitting: Physician Assistant

## 2024-11-30 ENCOUNTER — Ambulatory Visit (HOSPITAL_COMMUNITY)
Admission: RE | Admit: 2024-11-30 | Discharge: 2024-11-30 | Disposition: A | Payer: MEDICAID | Source: Ambulatory Visit | Attending: Physician Assistant

## 2024-11-30 ENCOUNTER — Ambulatory Visit (HOSPITAL_COMMUNITY)
Admission: RE | Admit: 2024-11-30 | Discharge: 2024-11-30 | Disposition: A | Payer: MEDICAID | Source: Ambulatory Visit | Attending: Physician Assistant | Admitting: Physician Assistant

## 2024-11-30 VITALS — BP 122/82 | HR 100 | Ht 69.0 in | Wt 268.0 lb

## 2024-11-30 DIAGNOSIS — I480 Paroxysmal atrial fibrillation: Secondary | ICD-10-CM

## 2024-11-30 DIAGNOSIS — I4891 Unspecified atrial fibrillation: Secondary | ICD-10-CM

## 2024-11-30 NOTE — Progress Notes (Signed)
 Primary Care Physician: [name], MD Primary Cardiologist: None Electrophysiologist: None  Referring Physician: ED   Willie Schmidt is a 63 y.o. male with a history of stage IV lung CA followed by [facility], HTN, chronic bronchitis, who presents for follow up in the [facility]ibr[facility].  The patient was initially diagnosed with AF with RVR on 11/07/2024 when he presented to the ED at [facility] with complaint of chest pain and progressive shortness of breath.  Chest x-ray was completed showing small effusion and vascular congestion.  CT angio was completed showing no evidence of PE and patient was started on azithromycin  for pneumonia. EKG was completed showing AF with RVR and patient was placed on Cardizem  drip and then transition to Cardizem  360 mg daily, metoprolol  tartrate 25 mg twice daily and started on Eliquis .  2D echo was completed showing EF of 55 to 60% with grade 1 DD and low normal RV systolic function with trivial MR and no AR. Patient presents today for follow up for atrial fibrillation.   Willie Schmidt presents today for posthospital follow-up in the AF clinic.  During today's visit we were able to connect with his daughter by phone.  EKG today was completed showing that he is in sinus rhythm and he continues to be asymptomatic and does not report any episodes of his heart racing since discharge.  He has been compliant with his Eliquis  and denies any missed doses since his hospitalization  During today's visit discussed the pathophysiology of A-fib and also reviewed his stroke risk and indication for anticoagulant.  He had all questions answered to his satisfaction and denied any side effects with his rate control medications.  He was advised to obtain a blood pressure cuff due to concomitant use of metoprolol  and Cardizem .  Today, he denies symptoms of palpitations, chest pain, shortness of breath, orthopnea, PND, lower extremity edema, dizziness,  presyncope, syncope, snoring, daytime somnolence, bleeding, or neurologic sequela. The patient is tolerating medications without difficulties and is otherwise without complaint today.    Atrial Fibrillation Risk Factors:  History of Sleep Apnea History of alcohol use no  Atrial Fibrillation Management history:  Previous antiarrhythmic drugs: None Previous cardioversions: None Previous ablations: None Anticoagulation history: Eliquis   ROS- All systems are reviewed and negative except as per the HPI above.  Past Medical History:  Diagnosis Date   Bronchitis, chronic (HCC)    Hypertension    metastatic lung ca dx'd 02/2019   lyphadenopathy, bil pulmonary noduels; suspected bone lesions    Current Outpatient Medications  Medication Sig Dispense Refill   albuterol  (PROVENTIL ) (2.5 MG/3ML) 0.083% nebulizer solution Take 2.5 mg by nebulization every 6 (six) hours as needed for wheezing or shortness of breath.     albuterol  (VENTOLIN  HFA) 108 (90 Base) MCG/ACT inhaler Inhale 2 puffs into the lungs every 6 (six) hours as needed for wheezing or shortness of breath. 1 each 2   apixaban  (ELIQUIS ) 5 MG TABS tablet Take 1 tablet (5 mg total) by mouth 2 (two) times daily. 180 tablet 0   clobetasol ointment (TEMOVATE) 0.05 % Apply 1 Application topically 2 (two) times daily.     cyclobenzaprine  (FLEXERIL ) 10 MG tablet Take 10 mg by mouth 3 (three) times daily as needed for muscle spasms.     diclofenac Sodium (VOLTAREN) 1 % GEL Apply 1 g topically daily as needed (pain).     diltiazem  (CARDIZEM  CD) 360 MG 24 hr capsule Take 1 capsule (360 mg  total) by mouth daily. 90 capsule 0   diphenhydrAMINE  (BENADRYL ) 25 mg capsule Take 25 mg by mouth every 6 (six) hours as needed for itching.     DULoxetine  (CYMBALTA ) 60 MG capsule Take 60 mg by mouth 2 (two) times daily. (Patient taking differently: Take 60 mg by mouth daily.)     famotidine  (PEPCID ) 20 MG tablet Take 20 mg by mouth daily.     fluocinonide   ointment (LIDEX ) 0.05 % Apply 1 Application topically in the morning and at bedtime.     folic acid  (FOLVITE ) 1 MG tablet Take 1 tablet by mouth daily.     furosemide  (LASIX ) 40 MG tablet Take 1 tablet (40 mg total) by mouth daily. 90 tablet 0   gabapentin  (NEURONTIN ) 300 MG capsule Take 1 capsule (300 mg total) by mouth 3 (three) times daily. 90 capsule 3   methocarbamol  (ROBAXIN ) 500 MG tablet Take 1 tablet (500 mg total) by mouth 2 (two) times daily. 20 tablet 0   methotrexate (RHEUMATREX) 2.5 MG tablet Take 10 mg by mouth once a week.     metolazone  (ZAROXOLYN ) 5 MG tablet Take 5 mg by mouth daily as needed (severe swelling).     metoprolol  tartrate (LOPRESSOR ) 25 MG tablet Take 1 tablet (25 mg total) by mouth 2 (two) times daily. 180 tablet 0   naloxone (NARCAN) nasal spray 4 mg/0.1 mL Place 0.4 mg into the nose as needed (OD).     ondansetron  (ZOFRAN -ODT) 4 MG disintegrating tablet Take 4 mg by mouth daily as needed for nausea or vomiting.     oxyCODONE -acetaminophen  (PERCOCET) 10-325 MG tablet Take 1 tablet by mouth 3 (three) times daily.     polyethylene glycol (MIRALAX  / GLYCOLAX ) 17 g packet Take 17 g by mouth daily as needed for mild constipation.     sennosides-docusate sodium  (SENOKOT-S) 8.6-50 MG tablet Take 1 tablet by mouth daily as needed for constipation.     silver  sulfADIAZINE  (SILVADENE ) 1 % cream Apply 1 Application topically 2 (two) times daily.     triamcinolone  ointment (KENALOG ) 0.1 % Apply 1 Application topically 2 (two) times daily.     No current facility-administered medications for this encounter.    Physical Exam: BP 122/82   Pulse 100   Ht 5' 9 (1.753 m)   Wt 121.6 kg   BMI 39.58 kg/m   GEN: Well nourished, well developed in no acute distress NECK: No JVD; No carotid bruits CARDIAC: Regular rate and rhythm, no murmurs, rubs, gallops RESPIRATORY:  Clear to auscultation without rales, wheezing or rhonchi  ABDOMEN: Soft, non-tender,  non-distended EXTREMITIES:  No edema; No deformity   Wt Readings from Last 3 Encounters:  11/30/24 121.6 kg  11/07/24 120.2 kg  06/03/24 120.2 kg     EKG today demonstrates:   EKG Interpretation Date/Time:    Ventricular Rate:    PR Interval:    QRS Duration:    QT Interval:    QTC Calculation:   R Axis:      Text Interpretation:          Echo Completed 11/09/2024  1. Poor acoustic windows limit study.   2. Left ventricular ejection fraction, by estimation, is 55 to 60%. The  left ventricle has normal function. Left ventricular diastolic parameters  are consistent with Grade I diastolic dysfunction (impaired relaxation).   3. Right ventricular systolic function is low normal. The right  ventricular size is normal.   4. The mitral valve is normal in structure.  Trivial mitral valve  regurgitation.   5. The aortic valve is tricuspid. Aortic valve regurgitation is not  visualized. Aortic valve sclerosis/calcification is present, without any  evidence of aortic stenosis.   6. The inferior vena cava is normal in size with greater than 50%  respiratory variability, suggesting right atrial pressure of 3 mmHg.    CHA2DS2-VASc Score = 1  The patient's score is based upon: CHF History: 0 HTN History: 1 Diabetes History: 0 Stroke History: 0 Vascular Disease History: 0 Age Score: 0 Gender Score: 0      ASSESSMENT AND PLAN: Paroxysmal Atrial Fibrillation (ICD10:  I48.0) The patient's CHA2DS2-VASc score is 1, indicating a 0.6% annual risk of stroke.   - The patient is in sinus rhythm by EKG and denies any episodes of heart racing since discharge. Suspect episode secondary to other acute medical issues. -Order 2-week ZIO to monitor AF burden - We may discontinue anticoagulant if no recurrence of A-fib after 4-weeks -CBC today - Continue Eliquis  5 mg twice daily - Continue Cardizem  360 mg daily and metoprolol  to tartrate 25 mg twice daily  HTN: -BP well controlled.  Continue current antihypertensive regimen.   HX Lung CA: -continue current treatment plan per oncology  Follow up with the AF Clinic in 1 month   Jackee Alberts, NP-C [facility] [address] [place], [place] [address] [phone]

## 2024-11-30 NOTE — Patient Instructions (Signed)
 Wear monitor for 14 days and remove 12/14/24 and send off in mail

## 2024-12-01 ENCOUNTER — Ambulatory Visit (HOSPITAL_COMMUNITY): Payer: Self-pay | Admitting: Nurse Practitioner

## 2024-12-01 LAB — CBC
Hematocrit: 44.1 % (ref 37.5–51.0)
Hemoglobin: 14 g/dL (ref 13.0–17.7)
MCH: 29.6 pg (ref 26.6–33.0)
MCHC: 31.7 g/dL (ref 31.5–35.7)
MCV: 93 fL (ref 79–97)
Platelets: 260 x10E3/uL (ref 150–450)
RBC: 4.73 x10E6/uL (ref 4.14–5.80)
RDW: 13.1 % (ref 11.6–15.4)
WBC: 12.1 x10E3/uL — ABNORMAL HIGH (ref 3.4–10.8)

## 2024-12-27 NOTE — Addendum Note (Signed)
 Encounter addended by: Franchot Glade RAMAN, RN on: 12/27/2024 9:02 AM  Actions taken: Imaging Exam ended

## 2024-12-30 NOTE — Progress Notes (Signed)
 "  Primary Care Physician: [name], MD Primary Cardiologist: None Electrophysiologist: None  Referring Physician: ED   Willie Schmidt is a 64 y.o. male with a history of paroxysmal AF, stage IV metastatic lung CA followed by [facility], HTN, chronic bronchitis , who presents for follow up in the [facility].    Willie Schmidt was seen initially in the AF clinic on 11/30/2024 after developing AF with RVR during hospitalization on 11/11/2024 for pneumonia.  During his visit he was in sinus rhythm and reported no episodes of heart racing since hospitalization.  He was compliant with his Eliquis  and denied any missed doses.  He wore a 2-week ZIO monitor to quantify AF burden with plan to discontinue anticoagulant after 4 weeks if no recurrence occurred.  ZIO results showed sinus rhythm with nonsustained SVT longest episode 17 beats and no recurrence of atrial fibrillation.  Willie Schmidt presented today for 1 month follow-up with his daughter.  On examination today patient was in atrial flutter with RVR and symptomatic with shortness of breath with exertion.On auscultation he had bilateral crackles and rails and lower extremities were edematous +2 bilaterally. He reports no missed doses of his Eliquis  since his previous follow-up visit and was advised to go to the ED for immediate evaluation. Call was placed to ED triage over at [facility] advising patient of his impending arrival.We will schedule patient for follow-up post cardioversion and was advised to continue his Eliquis  without any interruption moving forward.  Today, he denies symptoms of palpitations, chest pain, shortness of breath, orthopnea, PND, lower extremity edema, dizziness, presyncope, syncope, snoring, daytime somnolence, bleeding, or neurologic sequela. The patient is tolerating medications without difficulties and is otherwise without complaint today.   Atrial Fibrillation Management history: Previous  antiarrhythmic drugs: None Previous cardioversions: None Previous ablations: None Anticoagulation history: Eliquis   ROS- All systems are reviewed and negative except as per the HPI above.  Past Medical History:  Diagnosis Date   Bronchitis, chronic (HCC)    Hypertension    metastatic lung ca dx'd 02/2019   lyphadenopathy, bil pulmonary noduels; suspected bone lesions    Current Outpatient Medications  Medication Sig Dispense Refill   albuterol  (PROVENTIL ) (2.5 MG/3ML) 0.083% nebulizer solution Take 2.5 mg by nebulization every 6 (six) hours as needed for wheezing or shortness of breath.     albuterol  (VENTOLIN  HFA) 108 (90 Base) MCG/ACT inhaler Inhale 2 puffs into the lungs every 6 (six) hours as needed for wheezing or shortness of breath. 1 each 2   apixaban  (ELIQUIS ) 5 MG TABS tablet Take 1 tablet (5 mg total) by mouth 2 (two) times daily. 180 tablet 0   clobetasol  ointment (TEMOVATE ) 0.05 % Apply 1 Application topically 2 (two) times daily.     cyclobenzaprine  (FLEXERIL ) 10 MG tablet Take 10 mg by mouth 3 (three) times daily as needed for muscle spasms.     diclofenac  Sodium (VOLTAREN ) 1 % GEL Apply 1 g topically daily as needed (pain).     diltiazem  (CARDIZEM  CD) 360 MG 24 hr capsule Take 1 capsule (360 mg total) by mouth daily. 90 capsule 0   diphenhydrAMINE  (BENADRYL ) 25 mg capsule Take 25 mg by mouth every 6 (six) hours as needed for itching.     DULoxetine  (CYMBALTA ) 60 MG capsule Take 60 mg by mouth 2 (two) times daily. (Patient taking differently: Take 60 mg by mouth daily.)     famotidine  (PEPCID ) 20 MG tablet Take 20 mg by mouth  daily.     fluocinonide  ointment (LIDEX ) 0.05 % Apply 1 Application topically in the morning and at bedtime.     folic acid  (FOLVITE ) 1 MG tablet Take 1 tablet by mouth daily.     furosemide  (LASIX ) 40 MG tablet Take 1 tablet (40 mg total) by mouth daily. 90 tablet 0   gabapentin  (NEURONTIN ) 300 MG capsule Take 1 capsule (300 mg total) by mouth 3  (three) times daily. 90 capsule 3   methocarbamol  (ROBAXIN ) 500 MG tablet Take 1 tablet (500 mg total) by mouth 2 (two) times daily. 20 tablet 0   methotrexate  (RHEUMATREX) 2.5 MG tablet Take 10 mg by mouth once a week.     metolazone  (ZAROXOLYN ) 5 MG tablet Take 5 mg by mouth daily as needed (severe swelling).     metoprolol  tartrate (LOPRESSOR ) 25 MG tablet Take 1 tablet (25 mg total) by mouth 2 (two) times daily. 180 tablet 0   naloxone (NARCAN) nasal spray 4 mg/0.1 mL Place 0.4 mg into the nose as needed (OD).     ondansetron  (ZOFRAN -ODT) 4 MG disintegrating tablet Take 4 mg by mouth daily as needed for nausea or vomiting.     oxyCODONE -acetaminophen  (PERCOCET) 10-325 MG tablet Take 1 tablet by mouth 3 (three) times daily.     polyethylene glycol (MIRALAX  / GLYCOLAX ) 17 g packet Take 17 g by mouth daily as needed for mild constipation.     sennosides-docusate sodium  (SENOKOT-S) 8.6-50 MG tablet Take 1 tablet by mouth daily as needed for constipation.     silver  sulfADIAZINE  (SILVADENE ) 1 % cream Apply 1 Application topically 2 (two) times daily.     triamcinolone  ointment (KENALOG ) 0.1 % Apply 1 Application topically 2 (two) times daily.     No current facility-administered medications for this encounter.   Physical Exam: BP (!) 128/100   Pulse (!) 132   Ht 5' 9 (1.753 m)   Wt 128 kg   BMI 41.67 kg/m   GEN: Well nourished, well developed in no acute distress NECK: No JVD; No carotid bruits CARDIAC: Irregularly irregular rate and rhythm, no murmurs, rubs, gallops RESPIRATORY: Bilateral rales with wheezing and rhonchi  ABDOMEN: Soft, non-tender, non-distended EXTREMITIES: +2 bilateral pitting edema  Wt Readings from Last 3 Encounters:  12/31/24 128 kg  11/30/24 121.6 kg  11/07/24 120.2 kg     EKG today demonstrates:   EKG Interpretation Date/Time:  Friday December 31 2024 09:16:18 EST Ventricular Rate:  132 PR Interval:  136 QRS Duration:  90 QT Interval:  274 QTC  Calculation: 405 R Axis:   24  Text Interpretation: Atrial flutter T wave abnormality, consider inferior ischemia Abnormal ECG When compared with ECG of 30-Nov-2024 09:06, Premature supraventricular complexes are no longer Present T wave inversion now evident in Inferior leads Reconfirmed by Wyn Manus [phone]) on 12/31/2024 9:27:38 AM       Echo Completed 11/09/2024:  1. Poor acoustic windows limit study.   2. Left ventricular ejection fraction, by estimation, is 55 to 60%. The  left ventricle has normal function. Left ventricular diastolic parameters  are consistent with Grade I diastolic dysfunction (impaired relaxation).   3. Right ventricular systolic function is low normal. The right  ventricular size is normal.   4. The mitral valve is normal in structure. Trivial mitral valve  regurgitation.   5. The aortic valve is tricuspid. Aortic valve regurgitation is not  visualized. Aortic valve sclerosis/calcification is present, without any  evidence of aortic stenosis.   6.  The inferior vena cava is normal in size with greater than 50%  respiratory variability, suggesting right atrial pressure of 3 mmHg.   CHA2DS2-VASc Score = 1  The patient's score is based upon: CHF History: 0 HTN History: 1 Diabetes History: 0 Stroke History: 0 Vascular Disease History: 0 Age Score: 0 Gender Score: 0       ASSESSMENT AND PLAN: Paroxysmal Atrial Fibrillation (ICD10:  I48.0) -The patient's CHA2DS2-VASc score is 1, indicating a 0.6% annual risk of stroke.   - Patient diagnosed with atrial fibrillation during acute illness with pneumonia in 10/2024. -2-week ZIO monitor showed no recurrence of AF with  frequent episodes of nonsustained SVT.   -On examination  today patient symptomatic with atrial flutter with RVR and rate of 132 bpm. -He reported no missed doses of his Eliquis  since his prior visit and was advised to report to the ED for emergent cardioversion. -Will plan to refer patient to  EP to discuss rhythm management for SVT seen on ZIO monitor at next follow up. -Patient will be seen in follow-up and 1 week post cardioversion - continue metoprolol  25 mg twice daily and Cardizem  360 mg daily  HTN: - BP elevated today in the setting of flutter with RVR at 120/100 -Advised to go to the ED for further evaluation today.  HX Lung CA: -continue current treatment plan per oncology  Signed,  Wyn Raddle, Jackee Shove, NP    12/31/2024 9:59 AM     Follow up with the AF Clinic in 1 week following E DCCV.  "

## 2024-12-31 ENCOUNTER — Encounter (HOSPITAL_COMMUNITY): Payer: Self-pay | Admitting: Nurse Practitioner

## 2024-12-31 ENCOUNTER — Encounter (HOSPITAL_COMMUNITY): Payer: Self-pay

## 2024-12-31 ENCOUNTER — Ambulatory Visit (INDEPENDENT_AMBULATORY_CARE_PROVIDER_SITE_OTHER)
Admission: RE | Admit: 2024-12-31 | Discharge: 2024-12-31 | Disposition: A | Payer: MEDICAID | Source: Ambulatory Visit | Attending: Nurse Practitioner | Admitting: Nurse Practitioner

## 2024-12-31 ENCOUNTER — Other Ambulatory Visit: Payer: Self-pay

## 2024-12-31 ENCOUNTER — Emergency Department (HOSPITAL_COMMUNITY): Payer: MEDICAID

## 2024-12-31 ENCOUNTER — Inpatient Hospital Stay (HOSPITAL_COMMUNITY)
Admission: EM | Admit: 2024-12-31 | Discharge: 2025-01-04 | DRG: 308 | Disposition: A | Discharge status: Home / Self Care | Payer: MEDICAID | Attending: Internal Medicine | Admitting: Internal Medicine

## 2024-12-31 VITALS — BP 128/100 | HR 132 | Ht 69.0 in | Wt 282.2 lb

## 2024-12-31 DIAGNOSIS — I361 Nonrheumatic tricuspid (valve) insufficiency: Secondary | ICD-10-CM | POA: Diagnosis not present

## 2024-12-31 DIAGNOSIS — Z79891 Long term (current) use of opiate analgesic: Secondary | ICD-10-CM | POA: Diagnosis not present

## 2024-12-31 DIAGNOSIS — Z7901 Long term (current) use of anticoagulants: Secondary | ICD-10-CM

## 2024-12-31 DIAGNOSIS — Z8701 Personal history of pneumonia (recurrent): Secondary | ICD-10-CM | POA: Diagnosis not present

## 2024-12-31 DIAGNOSIS — G893 Neoplasm related pain (acute) (chronic): Secondary | ICD-10-CM | POA: Diagnosis not present

## 2024-12-31 DIAGNOSIS — Z79899 Other long term (current) drug therapy: Secondary | ICD-10-CM | POA: Diagnosis not present

## 2024-12-31 DIAGNOSIS — I110 Hypertensive heart disease with heart failure: Secondary | ICD-10-CM | POA: Diagnosis present

## 2024-12-31 DIAGNOSIS — I4891 Unspecified atrial fibrillation: Secondary | ICD-10-CM | POA: Diagnosis present

## 2024-12-31 DIAGNOSIS — Z7984 Long term (current) use of oral hypoglycemic drugs: Secondary | ICD-10-CM

## 2024-12-31 DIAGNOSIS — I480 Paroxysmal atrial fibrillation: Secondary | ICD-10-CM | POA: Diagnosis not present

## 2024-12-31 DIAGNOSIS — C3490 Malignant neoplasm of unspecified part of unspecified bronchus or lung: Secondary | ICD-10-CM

## 2024-12-31 DIAGNOSIS — Z87891 Personal history of nicotine dependence: Secondary | ICD-10-CM

## 2024-12-31 DIAGNOSIS — I10 Essential (primary) hypertension: Secondary | ICD-10-CM

## 2024-12-31 DIAGNOSIS — I5033 Acute on chronic diastolic (congestive) heart failure: Secondary | ICD-10-CM | POA: Diagnosis present

## 2024-12-31 DIAGNOSIS — C7951 Secondary malignant neoplasm of bone: Secondary | ICD-10-CM | POA: Diagnosis present

## 2024-12-31 DIAGNOSIS — L309 Dermatitis, unspecified: Secondary | ICD-10-CM | POA: Diagnosis present

## 2024-12-31 DIAGNOSIS — Z85118 Personal history of other malignant neoplasm of bronchus and lung: Secondary | ICD-10-CM

## 2024-12-31 DIAGNOSIS — J42 Unspecified chronic bronchitis: Secondary | ICD-10-CM | POA: Diagnosis not present

## 2024-12-31 DIAGNOSIS — Z6841 Body Mass Index (BMI) 40.0 and over, adult: Secondary | ICD-10-CM

## 2024-12-31 DIAGNOSIS — E66813 Obesity, class 3: Secondary | ICD-10-CM | POA: Diagnosis present

## 2024-12-31 DIAGNOSIS — I340 Nonrheumatic mitral (valve) insufficiency: Secondary | ICD-10-CM | POA: Diagnosis not present

## 2024-12-31 DIAGNOSIS — L308 Other specified dermatitis: Secondary | ICD-10-CM | POA: Diagnosis not present

## 2024-12-31 DIAGNOSIS — I4892 Unspecified atrial flutter: Secondary | ICD-10-CM | POA: Diagnosis present

## 2024-12-31 DIAGNOSIS — I484 Atypical atrial flutter: Secondary | ICD-10-CM | POA: Diagnosis not present

## 2024-12-31 DIAGNOSIS — D6869 Other thrombophilia: Secondary | ICD-10-CM | POA: Diagnosis present

## 2024-12-31 LAB — CBC
HCT: 41.1 % (ref 39.0–52.0)
Hemoglobin: 13.1 g/dL (ref 13.0–17.0)
MCH: 30.8 pg (ref 26.0–34.0)
MCHC: 31.9 g/dL (ref 30.0–36.0)
MCV: 96.7 fL (ref 80.0–100.0)
Platelets: 233 K/uL (ref 150–400)
RBC: 4.25 MIL/uL (ref 4.22–5.81)
RDW: 14.4 % (ref 11.5–15.5)
WBC: 7.8 K/uL (ref 4.0–10.5)
nRBC: 0 % (ref 0.0–0.2)

## 2024-12-31 LAB — BASIC METABOLIC PANEL WITH GFR
Anion gap: 12 (ref 5–15)
BUN: 7 mg/dL — ABNORMAL LOW (ref 8–23)
CO2: 24 mmol/L (ref 22–32)
Calcium: 9.2 mg/dL (ref 8.9–10.3)
Chloride: 102 mmol/L (ref 98–111)
Creatinine, Ser: 0.71 mg/dL (ref 0.61–1.24)
GFR, Estimated: >60 mL/min
Glucose, Bld: 142 mg/dL — ABNORMAL HIGH (ref 70–99)
Potassium: 4.1 mmol/L (ref 3.5–5.1)
Sodium: 137 mmol/L (ref 135–145)

## 2024-12-31 LAB — MAGNESIUM: Magnesium: 1.9 mg/dL (ref 1.7–2.4)

## 2024-12-31 LAB — TROPONIN T, HIGH SENSITIVITY
Troponin T High Sensitivity: <15 ng/L (ref 0–19)
Troponin T High Sensitivity: <15 ng/L (ref 0–19)

## 2024-12-31 LAB — PRO BRAIN NATRIURETIC PEPTIDE: Pro Brain Natriuretic Peptide: 564 pg/mL — ABNORMAL HIGH

## 2024-12-31 MED ORDER — OXYCODONE HCL 5 MG PO TABS
5.0000 mg | ORAL_TABLET | Freq: Four times a day (QID) | ORAL | Status: DC | PRN
  Administered 2024-12-31 – 2025-01-01 (×4): 5 mg via ORAL
  Filled 2024-12-31 (×4): qty 1

## 2024-12-31 MED ORDER — METOPROLOL TARTRATE 25 MG PO TABS
25.0000 mg | ORAL_TABLET | Freq: Two times a day (BID) | ORAL | Status: DC
  Administered 2024-12-31: 25 mg via ORAL
  Filled 2024-12-31 (×2): qty 1

## 2024-12-31 MED ORDER — ACETAMINOPHEN 325 MG PO TABS: 650.0000 mg | ORAL_TABLET | Freq: Four times a day (QID) | ORAL | Status: DC | PRN

## 2024-12-31 MED ORDER — MORPHINE SULFATE (PF) 2 MG/ML IV SOLN
4.0000 mg | Freq: Once | INTRAVENOUS | Status: AC
  Administered 2024-12-31: 4 mg via INTRAVENOUS
  Filled 2024-12-31: qty 2

## 2024-12-31 MED ORDER — DILTIAZEM HCL-DEXTROSE 125-5 MG/125ML-% IV SOLN (PREMIX)
5.0000 mg/h | INTRAVENOUS | Status: DC
  Administered 2024-12-31: 5 mg/h via INTRAVENOUS
  Administered 2024-12-31 – 2025-01-01 (×2): 15 mg/h via INTRAVENOUS
  Filled 2024-12-31 (×3): qty 125

## 2024-12-31 MED ORDER — METHOTREXATE SODIUM 2.5 MG PO TABS
15.0000 mg | ORAL_TABLET | ORAL | Status: DC
  Administered 2025-01-01: 15 mg via ORAL
  Filled 2024-12-31: qty 6

## 2024-12-31 MED ORDER — CLOBETASOL PROPIONATE 0.05 % EX OINT
1.0000 | TOPICAL_OINTMENT | Freq: Two times a day (BID) | CUTANEOUS | Status: DC
  Administered 2025-01-01 – 2025-01-04 (×6): 1 via TOPICAL
  Filled 2024-12-31: qty 15

## 2024-12-31 MED ORDER — BISACODYL 5 MG PO TBEC: 5.0000 mg | DELAYED_RELEASE_TABLET | Freq: Every day | ORAL | Status: DC | PRN

## 2024-12-31 MED ORDER — FUROSEMIDE 10 MG/ML IJ SOLN
40.0000 mg | Freq: Once | INTRAMUSCULAR | Status: AC
  Administered 2024-12-31: 40 mg via INTRAVENOUS
  Filled 2024-12-31: qty 4

## 2024-12-31 MED ORDER — FUROSEMIDE 10 MG/ML IJ SOLN
40.0000 mg | Freq: Every day | INTRAMUSCULAR | Status: DC
  Filled 2024-12-31: qty 4

## 2024-12-31 MED ORDER — ALBUTEROL SULFATE (2.5 MG/3ML) 0.083% IN NEBU: 3.0000 mL | INHALATION_SOLUTION | Freq: Four times a day (QID) | RESPIRATORY_TRACT | Status: DC | PRN

## 2024-12-31 MED ORDER — ONDANSETRON HCL 4 MG/2ML IJ SOLN: 4.0000 mg | Freq: Four times a day (QID) | INTRAMUSCULAR | Status: DC | PRN

## 2024-12-31 MED ORDER — DICLOFENAC SODIUM 1 % EX GEL
2.0000 g | Freq: Every day | CUTANEOUS | Status: DC | PRN
  Filled 2024-12-31 (×2): qty 100

## 2024-12-31 MED ORDER — CYCLOBENZAPRINE HCL 10 MG PO TABS: 10.0000 mg | ORAL_TABLET | Freq: Three times a day (TID) | ORAL | Status: DC | PRN

## 2024-12-31 MED ORDER — SENNOSIDES-DOCUSATE SODIUM 8.6-50 MG PO TABS: 1.0000 | ORAL_TABLET | Freq: Every evening | ORAL | Status: DC | PRN

## 2024-12-31 MED ORDER — DILTIAZEM LOAD VIA INFUSION
15.0000 mg | Freq: Once | INTRAVENOUS | Status: AC
  Administered 2024-12-31: 15 mg via INTRAVENOUS
  Filled 2024-12-31: qty 15

## 2024-12-31 MED ORDER — APIXABAN 5 MG PO TABS
5.0000 mg | ORAL_TABLET | Freq: Two times a day (BID) | ORAL | Status: DC
  Administered 2024-12-31 – 2025-01-04 (×8): 5 mg via ORAL
  Filled 2024-12-31: qty 2
  Filled 2024-12-31: qty 1
  Filled 2024-12-31: qty 2
  Filled 2024-12-31 (×2): qty 1
  Filled 2024-12-31 (×3): qty 2

## 2024-12-31 MED ORDER — OXYCODONE-ACETAMINOPHEN 5-325 MG PO TABS
1.0000 | ORAL_TABLET | Freq: Four times a day (QID) | ORAL | Status: DC | PRN
  Administered 2024-12-31 – 2025-01-01 (×3): 1 via ORAL
  Filled 2024-12-31 (×3): qty 1

## 2024-12-31 MED ORDER — ACETAMINOPHEN 650 MG RE SUPP: 650.0000 mg | Freq: Four times a day (QID) | RECTAL | Status: DC | PRN

## 2024-12-31 MED ORDER — OXYCODONE-ACETAMINOPHEN 10-325 MG PO TABS: 1.0000 | ORAL_TABLET | Freq: Four times a day (QID) | ORAL | Status: DC | PRN

## 2024-12-31 MED ORDER — ONDANSETRON HCL 4 MG PO TABS: 4.0000 mg | ORAL_TABLET | Freq: Four times a day (QID) | ORAL | Status: DC | PRN

## 2024-12-31 NOTE — Consult Note (Signed)
 "  Cardiology Consultation   Patient ID: Willie Schmidt MRN: [id number]; DOB: [birth year]  Admit date: 12/31/2024 Date of Consult: 12/31/2024  PCP:  [name], MD   [facility] Providers Cardiologist:  None   { Click here to update MD or APP on Care Team, Refresh:1}     Patient Profile: Willie Schmidt is a 64 y.o. male with a hx of recently diagnosed paroxysmal atrial fibrillation, HTN, stage IV metastatic lung cancer followed by [facility]ology, chronic bronchitis who is being seen 12/31/2024 for the evaluation of symptomatic atrial flutter with RVR at the request of Dr. Thom Fetters.  History of Present Illness: Willie Schmidt has medical history as stated above. Of note, patient was recently hospitalized from 11/07/24-11/11/24 for acute hypoxemic respiratory failure in the setting of pneumonia during which he developed new onset atrial fibrillation with RVR. TTE with LVEF 55-60%, G1DD, low/normal RV systolic function. Treated with diltiazem  and metoprolol , started on Eliquis , discharged home with plan for outpatient follow-up in A-fib clinic. Per A-fib clinic note from 11/30/2024, he had been asymptomatic and compliant on Eliquis  since hospitalization. Following that appointment, he wore a 2-week ZIO monitor which showed predominant underlying sinus rhythm, 1 run of VT x 6 beats, many runs of SVT, and no recurrence of atrial fibrillation. At his 1 month A-fib clinic follow-up this morning, he was found to be in atrial flutter with RVR in the 140s, symptomatic with DOE, bilateral crackles and rales and +2 BLE pitting edema on exam. Patient confirmed he has not missed any Eliquis  doses, and he was advised to go to [facility] ED for immediate evaluation and management.  On arrival to the ED, patient was found to be tachycardic, tachypneic, and hypertensive. Given IV Lasix  40 mg x 1 dose and morphine , started on diltiazem  drip.  Relevant workup this admission: EKG showed atrial flutter with  variable AV block and RVR, ventricular rate 129 bpm Initial troponin undetectably low, pending delta Pending proBNP, magnesium  CBC and BMP largely unremarkable CXR with stable mild cardiomegaly and left pleural-parenchymal scarring, no acute findings   Past Medical History:  Diagnosis Date   Bronchitis, chronic (HCC)    Hypertension    metastatic lung ca dx'd 02/2019   lyphadenopathy, bil pulmonary noduels; suspected bone lesions    History reviewed. No pertinent surgical history.     Scheduled Meds:  Continuous Infusions:  diltiazem  (CARDIZEM ) infusion 15 mg/hr (12/31/24 1622)   PRN Meds:   Allergies:   Allergies[1]  Social History:   Social History   Socioeconomic History   Marital status: Single    Spouse name: Not on file   Number of children: Not on file   Years of education: Not on file   Highest education level: Not on file  Occupational History   Not on file  Tobacco Use   Smoking status: Former    Types: Cigarettes    Passive exposure: Past   Smokeless tobacco: Never   Tobacco comments:    Former smoker 12/31/24  Vaping Use   Vaping status: Never Used  Substance and Sexual Activity   Alcohol use: Yes   Drug use: No   Sexual activity: Not on file  Other Topics Concern   Not on file  Social History Narrative   Not on file   Social Drivers of Health   Tobacco Use: Medium Risk (12/31/2024)   Patient History    Smoking Tobacco Use: Former    Smokeless Tobacco Use: Never  Passive Exposure: Past  Physicist, Medical Strain: Not on file  Food Insecurity: No Food Insecurity (11/08/2024)   Epic    Worried About Programme Researcher, Broadcasting/film/video in the Last Year: Never true    Ran Out of Food in the Last Year: Never true  Transportation Needs: No Transportation Needs (11/08/2024)   Epic    Lack of Transportation (Medical): No    Lack of Transportation (Non-Medical): No  Physical Activity: Not on file  Stress: Not on file  Social Connections: Socially  Integrated (11/08/2024)   Social Connection and Isolation Panel    Frequency of Communication with Friends and Family: More than three times a week    Frequency of Social Gatherings with Friends and Family: More than three times a week    Attends Religious Services: More than 4 times per year    Active Member of [employer] or Organizations: Yes    Attends Banker Meetings: More than 4 times per year    Marital Status: Living with partner  Intimate Partner Violence: Not At Risk (11/08/2024)   Epic    Fear of Current or Ex-Partner: No    Emotionally Abused: No    Physically Abused: No    Sexually Abused: No  Depression (PHQ2-9): Not on file  Alcohol Screen: Not on file  Housing: Low Risk (11/08/2024)   Epic    Unable to Pay for Housing in the Last Year: No    Number of Times Moved in the Last Year: 0    Homeless in the Last Year: No  Utilities: Not At Risk (11/08/2024)   Epic    Threatened with loss of utilities: No  Health Literacy: Not on file    Family History:    Family History  Problem Relation Age of Onset   Chronic Renal Failure Mother      ROS:  Please see the history of present illness.  / -All other ROS reviewed and negative.    / Physical Exam/Data: Vitals:   12/31/24 1008 12/31/24 1430 12/31/24 1450 12/31/24 1600  BP:  (!) 140/114  (!) 152/102  Pulse:  (!) 144  (!) 149  Resp:  (!) 23  (!) 27  Temp:   97.7 F (36.5 C)   TempSrc:   Oral   SpO2:  100%  100%  Weight: 128 kg     Height: 5' 9 (1.753 m)      No intake or output data in the 24 hours ending 12/31/24 1703    12/31/2024   10:08 AM 12/31/2024    9:13 AM 11/30/2024    8:49 AM  Last 3 Weights  Weight (lbs) 282 lb 3.2 oz 282 lb 3.2 oz 268 lb  Weight (kg) 128.005 kg 128.005 kg 121.564 kg     Body mass index is 41.67 kg/m.  General:  Well nourished, well developed, in no acute distress HEENT: normal Neck: no JVD Vascular: No carotid bruits; Distal pulses 2+ bilaterally Cardiac:   irregular and tachycardic Lungs: Clear to auscultation bilaterally, no wheezing, rhonchi or rales  Abd: soft, nontender, no hepatomegaly  Ext: no edema Musculoskeletal:  No deformities, BUE and BLE strength normal and equal Skin: warm and dry  Neuro:  CNs 2-12 intact, no focal abnormalities noted Psych:  Normal affect   EKG: The EKG was personally reviewed and demonstrates: Atrial flutter with variable AV block with RVR, ventricular rate 129 bpm Telemetry: Telemetry was personally reviewed and demonstrates:  Aflutter RVR  Relevant CV Studies:  CXR [12/31/24]: Stable mild cardiomegaly and left pleural-parenchymal scarring. No acute findings.  Zio monitor [11/30/24-12/14/24]: Patient had a min HR of 52 bpm, max HR of 255 bpm, and avg HR of 89 bpm. Predominant underlying rhythm was Sinus Rhythm. 1 run of Ventricular Tachycardia occurred lasting 6 beats with a max rate of 235 bpm ( avg 197 bpm) . 1367 Supraventricular Tachycardia runs occurred, the run with the fastest interval lasting 6 beats with a max rate of 255 bpm, the longest lasting 17 beats with an avg rate of 145 bpm. Isolated SVEs were frequent ( 12. 9% , 764905) , SVE Couplets were occasional ( 4. 5% , 41331) , and SVE Triplets were occasional ( 1. 1% , 6718) . Isolated VEs were rare ( < 1. 0% , 16953) , VE Couplets were rare ( < 1. 0% , 223) , and VE Triplets were rare ( < 1. 0% , 10) . Ventricular Bigeminy was present.  Echo [11/09/24]:  Poor acoustic windows limit study.  Left ventricular ejection fraction, by estimation, is 55 to 60% . The left ventricle has normal function. Left ventricular diastolic parameters are consistent with Grade I diastolic dysfunction ( impaired relaxation) .  Right ventricular systolic function is low normal. The right ventricular size is normal.  The mitral valve is normal in structure. Trivial mitral valve regurgitation.  The aortic valve is tricuspid. Aortic valve regurgitation is not visualized. Aortic  valve sclerosis/ calcification is present, without any evidence of aortic stenosis.  The inferior vena cava is normal in size with greater than 50% respiratory variability, suggesting right atrial pressure of 3 mmHg.   Laboratory Data: High Sensitivity Troponin:  No results for input(s): TROPONINIHS in the last 720 hours.  Recent Labs  Lab 12/31/24 1252 12/31/24 1451  TRNPT <15 <15      Chemistry Recent Labs  Lab 12/31/24 1030 12/31/24 1451  NA 137  --   K 4.1  --   CL 102  --   CO2 24  --   GLUCOSE 142*  --   BUN 7*  --   CREATININE 0.71  --   CALCIUM 9.2  --   MG  --  1.9  GFRNONAA >60  --   ANIONGAP 12  --     No results for input(s): PROT, ALBUMIN, AST, ALT, ALKPHOS, BILITOT in the last 168 hours. Lipids No results for input(s): CHOL, TRIG, HDL, LABVLDL, LDLCALC, CHOLHDL in the last 168 hours.  Hematology Recent Labs  Lab 12/31/24 1030  WBC 7.8  RBC 4.25  HGB 13.1  HCT 41.1  MCV 96.7  MCH 30.8  MCHC 31.9  RDW 14.4  PLT 233   Thyroid  No results for input(s): TSH, FREET4 in the last 168 hours.  BNP Recent Labs  Lab 12/31/24 1451  PROBNP 564.0*    DDimer No results for input(s): DDIMER in the last 168 hours.  Radiology/Studies:  DG Chest 2 View Result Date: 12/31/2024 CLINICAL DATA:  Shortness of breath.  Nausea. EXAM: CHEST - 2 VIEW COMPARISON:  11/07/2024 FINDINGS: Stable mild cardiomegaly. Stable pleural-parenchymal scarring in left hemithorax. Right lung remains clear. IMPRESSION: Stable mild cardiomegaly and left pleural-parenchymal scarring. No acute findings. Electronically Signed   By: Norleen DELENA Kil M.D.   On: 12/31/2024 11:13    Assessment and Plan:  Paroxysmal atrial flutter/fibrillation with RVR Recently diagnosed during 11/07/24-11/11/24 hospitalization for pneumonia; discharged home on diltiazem  360 mg daily, metoprolol  tartrate 25 mg BID, and Eliquis  5 mg BID Presented  with DOE and 2+ pitting edema at A-fib  clinic OV this morning, found to be in atrial flutter with RVR in the 140s on EKG Patient unsure if he has missed any doses of Eliquis , will need TEE/DCCV Continue diltiazem  drip  Hypertension Hypertensive in the 140s/110s on arrival  Continue home antihypertensives  Per primary Chronic bronchitis Stage IV metastatic lung cancer   Risk Assessment/Risk Scores: {Complete the following score calculators/questions to meet required metrics.  Press F2         :[id number]}   {Is the patient being seen for unstable angina, ACS, NSTEMI or STEMI?:[phone]} {Does this patient have CHF or CHF symptoms?      :[id number]} {Does this patient have ATRIAL FIBRILLATION?[phone]}  {Are we signing off today?:[id number]}  For questions or updates, please contact [facility] Please consult [email or web address] for contact info under    {TIP  Split Shared Billing  Do NOT delete any part of this including brackets If split shared billing is based upon MDM, disregard If billing will be based upon TIME you MUST document the number of minutes and a detailed list of what was done in that time in the following format Example - I spent ** minutes seeing this patient. During that time I reviewed their history, evaluated their symptoms, reviewed available labs, EKGs, studies, performed an exam and formulated an assessment and plan   :1} {Select this only if you need to document critical care time (Optional):[phone]} Signed, Soyla DELENA [employer], MD  12/31/2024 5:03 PM     [1] No Known Allergies  "

## 2024-12-31 NOTE — ED Notes (Signed)
 Pt states he is compliant with taking his Eliquis .

## 2024-12-31 NOTE — ED Triage Notes (Signed)
 Pt was at the A-fib clinic when the provider recommended pt to come to ER. HR 141 Pt endorse sob and nausea.  Pt denies cp

## 2024-12-31 NOTE — ED Provider Notes (Signed)
 Pt seen by Dr Francesca.  Please see his note. Pt noted to be in A fib RVR.  Pt has missed several doses of eliquis .  Not a candidate for ED cardioversion. Cardiology evaluating pt.  Disposition pending.  Patient was seen by Dr. Loni.  Plan is to continue the cardioversion and plan for TEE/DCCV  Case discussed with Dr. Lou regarding admission   Randol Simmonds, MD 12/31/24 1818

## 2024-12-31 NOTE — ED Provider Notes (Signed)
 " [place] EMERGENCY DEPARTMENT AT [facility] Provider Note  CSN: [id number] Arrival date & time: 12/31/24 1000  Chief Complaint(s) Afib RVR  HPI Willie Schmidt is a 64 y.o. male history of lung cancer, recent admission for pneumonia, atrial fibrillation, started on Eliquis  presenting to the emergency department with atrial fibrillation.  Went to the A-fib clinic today, they noticed he was in A-fib with RVR and sent to the ER.  He also reports he has had some exertional shortness of breath and dyspnea, lower extremity swelling.  When asked whether he has been compliant with his Eliquis  he states I ain't going to lie to you doctor, I think missed a few doses.  His daughter prepares his medications but is not sure that he takes them as indicated.  No fevers, chills, cough.  No abdominal pain.  Does report some occasional intermittent chest pain.  Symptoms are mild.   Past Medical History Past Medical History:  Diagnosis Date   Bronchitis, chronic (HCC)    Hypertension    metastatic lung ca dx'd 02/2019   lyphadenopathy, bil pulmonary noduels; suspected bone lesions   Patient Active Problem List   Diagnosis Date Noted   Pneumonia 11/09/2024   Elevated alkaline phosphatase level 11/08/2024   Normocytic anemia 11/08/2024   Class 2 obesity 11/08/2024   Acute hypoxemic respiratory failure (HCC) 11/07/2024   Adenocarcinoma (HCC) 02/16/2024   NSCLC of left lung (HCC) 02/16/2024   Palliative care encounter 12/01/2023   Cellulitis 11/27/2023   Rash 11/26/2023   Hypokalemia 11/26/2023   Fever 09/17/2023   CAP (community acquired pneumonia) 09/16/2023   Right shoulder pain 04/22/2019   Adenocarcinoma of left lung, stage 4 (HCC) 03/03/2019   Encounter for antineoplastic chemotherapy 03/03/2019   Encounter for antineoplastic immunotherapy 03/03/2019   Goals of care, counseling/discussion 03/03/2019   Neck pain on right side 03/03/2019   Neck pain 02/17/2019   Home  Medication(s) Prior to Admission medications  Medication Sig Start Date End Date Taking? Authorizing Provider  albuterol  (PROVENTIL ) (2.5 MG/3ML) 0.083% nebulizer solution Take 2.5 mg by nebulization every 6 (six) hours as needed for wheezing or shortness of breath.    [provider]  albuterol  (VENTOLIN  HFA) 108 (90 Base) MCG/ACT inhaler Inhale 2 puffs into the lungs every 6 (six) hours as needed for wheezing or shortness of breath. 11/11/24   [place], Camellia JINNY, DO  apixaban  (ELIQUIS ) 5 MG TABS tablet Take 1 tablet (5 mg total) by mouth 2 (two) times daily. 11/11/24 02/09/25  [place], Eric J, DO  clobetasol  ointment (TEMOVATE ) 0.05 % Apply 1 Application topically 2 (two) times daily. 07/07/24   [provider]  cyclobenzaprine  (FLEXERIL ) 10 MG tablet Take 10 mg by mouth 3 (three) times daily as needed for muscle spasms. 04/20/24   [provider]  diclofenac  Sodium (VOLTAREN ) 1 % GEL Apply 1 g topically daily as needed (pain). 03/08/24   [provider]  diltiazem  (CARDIZEM  CD) 360 MG 24 hr capsule Take 1 capsule (360 mg total) by mouth daily. 11/12/24 02/10/25  [place], Eric J, DO  diphenhydrAMINE  (BENADRYL ) 25 mg capsule Take 25 mg by mouth every 6 (six) hours as needed for itching.    [provider]  DULoxetine  (CYMBALTA ) 60 MG capsule Take 60 mg by mouth 2 (two) times daily. Patient taking differently: Take 60 mg by mouth daily.    [provider]  famotidine  (PEPCID ) 20 MG tablet Take 20 mg by mouth daily. 03/08/24 03/08/25  [provider]  fluocinonide  ointment (LIDEX ) 0.05 % Apply 1 Application topically in the morning and at bedtime. 10/13/24   [provider]  folic acid  (FOLVITE ) 1 MG tablet Take 1 tablet by mouth daily. 02/16/24 02/15/25  [provider]  furosemide  (LASIX ) 40 MG tablet Take 1 tablet (40 mg total) by mouth daily. 11/11/24 02/09/25  [place], Camellia PARAS, DO  gabapentin  (NEURONTIN ) 300 MG capsule Take 1  capsule (300 mg total) by mouth 3 (three) times daily. 02/09/20   Sherrod Sherrod, MD  methocarbamol  (ROBAXIN ) 500 MG tablet Take 1 tablet (500 mg total) by mouth 2 (two) times daily. 04/10/24   Hoy Nidia FALCON, PA-C  methotrexate  (RHEUMATREX) 2.5 MG tablet Take 10 mg by mouth once a week.    [provider]  metolazone  (ZAROXOLYN ) 5 MG tablet Take 5 mg by mouth daily as needed (severe swelling). 11/10/23   [provider]  metoprolol  tartrate (LOPRESSOR ) 25 MG tablet Take 1 tablet (25 mg total) by mouth 2 (two) times daily. 11/11/24 02/09/25  [place], Camellia PARAS, DO  naloxone [facility]) nasal spray 4 mg/0.1 mL Place 0.4 mg into the nose as needed (OD). 02/16/24   [provider]  ondansetron  (ZOFRAN -ODT) 4 MG disintegrating tablet Take 4 mg by mouth daily as needed for nausea or vomiting. 04/20/24   [provider]  oxyCODONE -acetaminophen  (PERCOCET) 10-325 MG tablet Take 1 tablet by mouth 3 (three) times daily. 11/04/24   [provider]  polyethylene glycol (MIRALAX  / GLYCOLAX ) 17 g packet Take 17 g by mouth daily as needed for mild constipation.    [provider]  sennosides-docusate sodium  (SENOKOT-S) 8.6-50 MG tablet Take 1 tablet by mouth daily as needed for constipation.    [provider]  silver  sulfADIAZINE  (SILVADENE ) 1 % cream Apply 1 Application topically 2 (two) times daily. 01/29/24   [provider]  triamcinolone  ointment (KENALOG ) 0.1 % Apply 1 Application topically 2 (two) times daily.    [provider]                                                                                                                                    Past Surgical History History reviewed. No pertinent surgical history. Family History Family History  Problem Relation Age of Onset   Chronic Renal Failure Mother     Social History Social History[1] Allergies Patient has no known allergies.  Review of Systems Review  of Systems  All other systems reviewed and are negative.   Physical Exam Vital Signs  I have reviewed the triage vital signs BP (!) 140/114   Pulse (!) 144   Temp 97.7 F (36.5 C) (Oral)   Resp (!) 23   Ht 5' 9 (1.753 m)   Wt 128 kg   SpO2 100%   BMI 41.67 kg/m  Physical Exam Vitals and nursing note reviewed.  Constitutional:      General:  He is not in acute distress.    Appearance: Normal appearance.  HENT:     Mouth/Throat:     Mouth: Mucous membranes are moist.  Eyes:     Conjunctiva/sclera: Conjunctivae normal.  Neck:     Vascular: Hepatojugular reflux and JVD present.  Cardiovascular:     Rate and Rhythm: Tachycardia present. Rhythm irregular.  Pulmonary:     Effort: Pulmonary effort is normal. No respiratory distress.     Breath sounds: Normal breath sounds.  Abdominal:     General: Abdomen is flat.     Palpations: Abdomen is soft.     Tenderness: There is no abdominal tenderness.  Musculoskeletal:     Right lower leg: Edema present.     Left lower leg: Edema present.  Skin:    General: Skin is warm and dry.     Capillary Refill: Capillary refill takes less than 2 seconds.  Neurological:     Mental Status: He is alert and oriented to person, place, and time. Mental status is at baseline.  Psychiatric:        Mood and Affect: Mood normal.        Behavior: Behavior normal.     ED Results and Treatments Labs (all labs ordered are listed, but only abnormal results are displayed) Labs Reviewed  BASIC METABOLIC PANEL WITH GFR - Abnormal; Notable for the following components:      Result Value   Glucose, Bld 142 (*)    BUN 7 (*)    All other components within normal limits  CBC  PRO BRAIN NATRIURETIC PEPTIDE  MAGNESIUM   TROPONIN T, HIGH SENSITIVITY  TROPONIN T, HIGH SENSITIVITY                                                                                                                          Radiology DG Chest 2 View Result Date:  12/31/2024 CLINICAL DATA:  Shortness of breath.  Nausea. EXAM: CHEST - 2 VIEW COMPARISON:  11/07/2024 FINDINGS: Stable mild cardiomegaly. Stable pleural-parenchymal scarring in left hemithorax. Right lung remains clear. IMPRESSION: Stable mild cardiomegaly and left pleural-parenchymal scarring. No acute findings. Electronically Signed   By: Norleen DELENA Kil M.D.   On: 12/31/2024 11:13    Pertinent labs & imaging results that were available during my care of the patient were reviewed by me and considered in my medical decision making (see MDM for details).  Medications Ordered in ED Medications  diltiazem  (CARDIZEM ) 1 mg/mL load via infusion 15 mg (15 mg Intravenous Bolus from Bag 12/31/24 1458)    And  diltiazem  (CARDIZEM ) 125 mg in dextrose  5% 125 mL (1 mg/mL) infusion (10 mg/hr Intravenous Rate/Dose Change 12/31/24 1548)  morphine  (PF) 2 MG/ML injection 4 mg (4 mg Intravenous Given 12/31/24 1447)  furosemide  (LASIX ) injection 40 mg (40 mg Intravenous Given 12/31/24 1501)  Procedures .Critical Care  Performed by: Francesca Elsie CROME, MD Authorized by: Francesca Elsie CROME, MD   Critical care provider statement:    Critical care time (minutes):  45   Critical care was time spent personally by me on the following activities:  Development of treatment plan with patient or surrogate, discussions with consultants, evaluation of patient's response to treatment, examination of patient, ordering and review of laboratory studies, ordering and review of radiographic studies, ordering and performing treatments and interventions, pulse oximetry, re-evaluation of patient's condition and review of old charts   (including critical care time)  Medical Decision Making / ED Course   MDM:  64 year old presenting to the emergency department with atrial flutter.  Patient appears somewhat  volume overloaded but not hypoxic.  Heart rate tachycardic and irregularly irregular, EKG shows atrial flutter.  Suspect his symptoms are likely due to atrial flutter with RVR.  He has been having palpitations for a few days.  He cannot tell me reliably that he has been compliant with his Eliquis  for the past 30 days and thus unfortunately he would not be a good candidate for ED cardioversion.  May need to have TEE with cardioversion or rate control.  She was just started on diltiazem .  He also looks somewhat volume overloaded concerning for mild CHF in the setting of arrhythmia.  Will give IV Lasix .  Denies any underlying fevers, chills, infectious symptoms, abdominal pain or other new symptoms to suggest any specific underlying trigger of his A-fib.  Discussed with Trish with cardiology, cardiology will consult to determine disposition    Clinical Course as of 12/31/24 1549  Fri Dec 31, 2024  1547 Signed out to Dr. Randol pending cardiology consult [WS]    Clinical Course User Index [WS] Francesca Elsie CROME, MD     Additional history obtained: -Additional history obtained from family -External records from outside source obtained and reviewed including: Chart review including previous notes, labs, imaging, consultation notes including cardiology notes    Lab Tests: -I ordered, reviewed, and interpreted labs.   The pertinent results include:   Labs Reviewed  BASIC METABOLIC PANEL WITH GFR - Abnormal; Notable for the following components:      Result Value   Glucose, Bld 142 (*)    BUN 7 (*)    All other components within normal limits  CBC  PRO BRAIN NATRIURETIC PEPTIDE  MAGNESIUM   TROPONIN T, HIGH SENSITIVITY  TROPONIN T, HIGH SENSITIVITY    Notable for mild hyperglycemia  EKG   EKG Interpretation Date/Time:  Friday December 31 2024 10:08:38 EST Ventricular Rate:  129 PR Interval:    QRS Duration:  92 QT Interval:  264 QTC Calculation: 386 R Axis:   29  Text  Interpretation: Atrial flutter with variable A-V block Nonspecific T wave abnormality Abnormal ECG When compared with ECG of 31-Dec-2024 09:16, PREVIOUS ECG IS PRESENT Confirmed by Yolande Charleston [id number]) on 12/31/2024 1:28:58 PM         Imaging Studies ordered: I ordered imaging studies including CXR On my interpretation imaging demonstrates no acute process I independently visualized and interpreted imaging. I agree with the radiologist interpretation   Medicines ordered and prescription drug management: Meds ordered this encounter  Medications   morphine  (PF) 2 MG/ML injection 4 mg   AND Linked Order Group    diltiazem  (CARDIZEM ) 1 mg/mL load via infusion 15 mg    diltiazem  (CARDIZEM ) 125 mg in dextrose  5% 125 mL (1 mg/mL) infusion   furosemide  (LASIX )  injection 40 mg    -I have reviewed the patients home medicines and have made adjustments as needed   Consultations Obtained: I requested consultation with the cardilogist,  and discussed lab and imaging findings as well as pertinent plan - they recommend: they will evaluate patient    Cardiac Monitoring: The patient was maintained on a cardiac monitor.  I personally viewed and interpreted the cardiac monitored which showed an underlying rhythm of: sinus tachycardia   Social Determinants of Health:  Diagnosis or treatment significantly limited by social determinants of health: obesity   Reevaluation: After the interventions noted above, I reevaluated the patient and found that their symptoms have improved  Co morbidities that complicate the patient evaluation  Past Medical History:  Diagnosis Date   Bronchitis, chronic (HCC)    Hypertension    metastatic lung ca dx'd 02/2019   lyphadenopathy, bil pulmonary noduels; suspected bone lesions      Dispostion: Disposition decision including need for hospitalization was considered, and patient admitted to the hospital.    Final Clinical Impression(s) / ED  Diagnoses Final diagnoses:  Atrial flutter with rapid ventricular response (HCC)     This chart was dictated using voice recognition software.  Despite best efforts to proofread,  errors can occur which can change the documentation meaning.     [1]  Social History Tobacco Use   Smoking status: Former    Types: Cigarettes    Passive exposure: Past   Smokeless tobacco: Never   Tobacco comments:    Former smoker 12/31/24  Vaping Use   Vaping status: Never Used  Substance Use Topics   Alcohol use: Yes   Drug use: No     Francesca Elsie CROME, MD 12/31/24 1549  "

## 2024-12-31 NOTE — H&P (Addendum)
 " History and Physical  SADIQ MCCAULEY FMW:[id number] DOB: 1961-04-01 DOA: 12/31/2024  PCP: [name], MD   Chief Complaint: A-fib with RVR  HPI: Willie Schmidt is a 64 y.o. male with medical history significant for paroxysmal atrial fibrillation, HTN, psoriasiform dermatitis, stage IV metastatic lung cancer followed by [facility], morbid obesity, chronic diastolic HF, and chronic bronchitis who was sent from the A-fib clinic to the ED for evaluation of A-fib with RVR. Per daughter, over the last 1.5 weeks, patient has had intermittent palpitations and chest discomfort. They have also noticed occasional change in his voice and increasing fatigue. Patient was seen at the A-fib clinic today and found to have heart rate in the 140s to 150s with EKG showing patient was back in A-fib so patient was sent directly to the ER for evaluation. Patient endorses some dyspnea on exertion, lower extremity swelling and occasional palpitations/chest discomfort but no shortness of breath at rest, fevers, chills, cough, abdominal pain, dizziness, nausea or vomiting. Reports he has been taking his Eliquis  but thinks he has likely missed a few doses over the last 2 weeks.  ED Course: Initial vitals show patient afebrile, RR 16-27, HR 130-150s, SBP 110-150s, SpO2 95% on room air. Initial labs significant for proBNP 564 (normal for age), normal troponin, normal CBC, renal function, and mag. EKG shows a flutter with variable AV block. CXR shows mild cardiomegaly but no acute findings. Pt received IV morphine , IV Lasix , IV Cardizem  and started on Cardizem  drip.  Cardiology was consulted for evaluation. TR[facility] was consulted for admission.   Review of Systems: Please see HPI for pertinent positives and negatives. A complete 10 system review of systems are otherwise negative.  Past Medical History:  Diagnosis Date   Bronchitis, chronic (HCC)    Hypertension    metastatic lung ca dx'd 02/2019   lyphadenopathy, bil pulmonary  noduels; suspected bone lesions   History reviewed. No pertinent surgical history. Social History:  reports that he has quit smoking. His smoking use included cigarettes. He has been exposed to tobacco smoke. He has never used smokeless tobacco. He reports current alcohol use. He reports that he does not use drugs.  Allergies[1]  Family History  Problem Relation Age of Onset   Chronic Renal Failure Mother      Prior to Admission medications  Medication Sig Start Date End Date Taking? Authorizing Provider  albuterol  (PROVENTIL ) (2.5 MG/3ML) 0.083% nebulizer solution Take 2.5 mg by nebulization every 6 (six) hours as needed for wheezing or shortness of breath.   Yes [provider]  albuterol  (VENTOLIN  HFA) 108 (90 Base) MCG/ACT inhaler Inhale 2 puffs into the lungs every 6 (six) hours as needed for wheezing or shortness of breath. 11/11/24  Yes [place], Eric J, DO  apixaban  (ELIQUIS ) 5 MG TABS tablet Take 1 tablet (5 mg total) by mouth 2 (two) times daily. 11/11/24 02/09/25 Yes [place], Eric J, DO  clobetasol  ointment (TEMOVATE ) 0.05 % Apply 1 Application topically 2 (two) times daily. 07/07/24  Yes [provider]  cyclobenzaprine  (FLEXERIL ) 10 MG tablet Take 10 mg by mouth 3 (three) times daily as needed for muscle spasms. 04/20/24  Yes [provider]  diclofenac  Sodium (VOLTAREN ) 1 % GEL Apply 1 g topically daily as needed (pain). 03/08/24  Yes [provider]  diltiazem  (CARDIZEM  CD) 360 MG 24 hr capsule Take 1 capsule (360 mg total) by mouth daily. 11/12/24 02/10/25 Yes [place], Eric J, DO  folic acid  (FOLVITE ) 1 MG tablet  Take 1 tablet by mouth daily. 02/16/24 02/15/25 Yes [provider]  furosemide  (LASIX ) 40 MG tablet Take 1 tablet (40 mg total) by mouth daily. 11/11/24 02/09/25 Yes [place], Eric J, DO  methotrexate  (RHEUMATREX) 2.5 MG tablet Take 15 mg by mouth once a week.   Yes [provider]  metoprolol  tartrate (LOPRESSOR ) 25 MG  tablet Take 1 tablet (25 mg total) by mouth 2 (two) times daily. 11/11/24 02/09/25 Yes [place], Camellia PARAS, DO  naloxone [facility]) nasal spray 4 mg/0.1 mL Place 0.4 mg into the nose as needed (OD). 02/16/24  Yes [provider]  ondansetron  (ZOFRAN -ODT) 4 MG disintegrating tablet Take 4 mg by mouth daily as needed for nausea or vomiting. 04/20/24  Yes [provider]  oxyCODONE -acetaminophen  (PERCOCET) 10-325 MG tablet Take 1 tablet by mouth every 6 (six) hours as needed for pain. 11/04/24  Yes [provider]  diphenhydrAMINE  (BENADRYL ) 25 mg capsule Take 25 mg by mouth every 6 (six) hours as needed for itching. Patient not taking: Reported on 12/31/2024    [provider]  DULoxetine  (CYMBALTA ) 60 MG capsule Take 60 mg by mouth 2 (two) times daily. Patient not taking: Reported on 12/31/2024    [provider]  famotidine  (PEPCID ) 20 MG tablet Take 20 mg by mouth daily. Patient not taking: Reported on 12/31/2024 03/08/24 03/08/25  [provider]  fluocinonide  ointment (LIDEX ) 0.05 % Apply 1 Application topically in the morning and at bedtime. Patient not taking: Reported on 12/31/2024 10/13/24   [provider]  gabapentin  (NEURONTIN ) 300 MG capsule Take 1 capsule (300 mg total) by mouth 3 (three) times daily. Patient not taking: Reported on 12/31/2024 02/09/20   Sherrod Sherrod, MD  methocarbamol  (ROBAXIN ) 500 MG tablet Take 1 tablet (500 mg total) by mouth 2 (two) times daily. Patient not taking: Reported on 12/31/2024 04/10/24   Hoy Nidia FALCON, PA-C  metolazone  (ZAROXOLYN ) 5 MG tablet Take 5 mg by mouth daily as needed (severe swelling). Patient not taking: Reported on 12/31/2024 11/10/23   [provider]  polyethylene glycol (MIRALAX  / GLYCOLAX ) 17 g packet Take 17 g by mouth daily as needed for mild constipation. Patient not taking: Reported on 12/31/2024    [provider]  sennosides-docusate sodium  (SENOKOT-S) 8.6-50 MG tablet  Take 1 tablet by mouth daily as needed for constipation. Patient not taking: Reported on 12/31/2024    [provider]  silver  sulfADIAZINE  (SILVADENE ) 1 % cream Apply 1 Application topically 2 (two) times daily. Patient not taking: Reported on 12/31/2024 01/29/24   [provider]  triamcinolone  ointment (KENALOG ) 0.1 % Apply 1 Application topically 2 (two) times daily. Patient not taking: Reported on 12/31/2024    [provider]    Physical Exam: BP 112/86   Pulse (!) 51   Temp (!) 97.3 F (36.3 C) (Oral)   Resp (!) 24   Ht 5' 9 (1.753 m)   Wt 128 kg   SpO2 100%   BMI 41.67 kg/m  General: Pleasant, well-appearing obese man laying in bed. No acute distress. HEENT: [place]/AT. Anicteric sclera CV: Tachycardic. Irregular rhythm. No murmurs, rubs, or gallops. Pulmonary: Lungs CTAB. Normal effort. No wheezing or rales. Decreased breath sounds at the bases. Abdominal: Soft, nontender, nondistended. Normal bowel sounds. Extremities: 2+ BLE edema. Palpable radial and DP pulses. Normal ROM. Skin: Warm and dry. Hyperpigmentation of the lower extremities. Neuro: A&Ox3. Moves all extremities. Normal sensation to light touch. No focal deficit. Psych: Normal mood and affect  Labs on Admission:  Basic Metabolic Panel: Recent Labs  Lab 12/31/24 1030 12/31/24 1451  NA 137  --   K 4.1  --   CL 102  --   CO2 24  --   GLUCOSE 142*  --   BUN 7*  --   CREATININE 0.71  --   CALCIUM 9.2  --   MG  --  1.9   Liver Function Tests: No results for input(s): AST, ALT, ALKPHOS, BILITOT, PROT, ALBUMIN in the last 168 hours. No results for input(s): LIPASE, AMYLASE in the last 168 hours. No results for input(s): AMMONIA in the last 168 hours. CBC: Recent Labs  Lab 12/31/24 1030  WBC 7.8  HGB 13.1  HCT 41.1  MCV 96.7  PLT 233   Cardiac Enzymes: No results for input(s): CKTOTAL, CKMB, CKMBINDEX, TROPONINI in the last 168 hours. BNP (last  3 results) No results for input(s): BNP in the last 8760 hours.  ProBNP (last 3 results) Recent Labs    11/07/24 2030 12/31/24 1451  PROBNP <50.0 564.0*    CBG: No results for input(s): GLUCAP in the last 168 hours.  Radiological Exams on Admission: DG Chest 2 View Result Date: 12/31/2024 CLINICAL DATA:  Shortness of breath.  Nausea. EXAM: CHEST - 2 VIEW COMPARISON:  11/07/2024 FINDINGS: Stable mild cardiomegaly. Stable pleural-parenchymal scarring in left hemithorax. Right lung remains clear. IMPRESSION: Stable mild cardiomegaly and left pleural-parenchymal scarring. No acute findings. Electronically Signed   By: Norleen DELENA Kil M.D.   On: 12/31/2024 11:13   Assessment/Plan Willie Schmidt is a 64 y.o. male with medical history significant for paroxysmal atrial fibrillation, HTN, psoriasiform dermatitis, stage IV metastatic lung cancer followed by [facility], morbid obesity, chronic diastolic HF and chronic bronchitis who was sent from the A-fib clinic to the ED for evaluation of A-fib with RVR.   # A-fib/A-flutter with RVR - Patient initially diagnosed with A-fib during a hospitalization for pneumonia in November 2025 - Patient had a 2-week Zio patch monitor that showed frequent episodes of nonsustained SVT but no recurrence of A-fib - He has had worsening dyspnea on exertion, with intermittent palpitations/chest discomfort over the last week - Found to have significant tachycardia with EKG showing atrial flutter with RVR at the A-fib clinic today - Currently on diltiazem  drip with HR still elevated to the 100s-120s but BP remains stable, will need close BP monitoring - Cardiology following, planning for TEE/DCCV next week - Continue Eliquis  and Lopressor  - Telemetry  # Acute on chronic diastolic HF - Last TTE 10/2024 shows EF 55-60%, G1DD with no valvular abnormalities - Patient with recent dyspnea on exertion and worsening lower extremity edema - Continue IV Lasix  40 mg  daily - Strict I&O, daily weights - Maintain K+ > 4.0, Mag > 2.0 - Telemetry  # HTN - BP slightly elevated with SBP in the 110-150s - Continue Lopressor   # Psoriasiform dermatitis - Follows with [facility] - Per daughter, patient's rash has significantly improved - Continue weekly methotrexate   # Chronic bronchitis - Chronic and stable, no respiratory distress - Continue as needed albuterol  inhaler  # Stage IV metastatic lung cancer # Bone metastases - 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 Filed Weights   12/31/24 1008  Weight: 128 kg  - F/u with PCP for weight lost and nutrition counseling  DVT prophylaxis: Eliquis     Code Status: Full Code  Consults called: Cardiology  Family Communication: Discussed findings/results and plan for admission with daughter at bedside  Severity of Illness: The appropriate patient status for this patient is INPATIENT. Inpatient status is judged to be reasonable and necessary in order to provide the required intensity of service to ensure the patient's safety. The patient's presenting symptoms, physical exam findings, and initial radiographic and laboratory data in the context of their chronic comorbidities is felt to place them at high risk for further clinical deterioration. Furthermore, it is not anticipated that the patient will be medically stable for discharge from the hospital within 2 midnights of admission.   * I certify that at the point of admission it is my clinical judgment that the patient will require inpatient hospital care spanning beyond 2 midnights from the point of admission due to high intensity of service, high risk for  further deterioration and high frequency of surveillance required.*  Level of care: Telemetry   I personally spent a total of 75 minutes in the care of the patient today including preparing to see the patient, getting/reviewing separately obtained history, performing a medically appropriate exam/evaluation, placing orders, documenting clinical information in the EHR, and communicating results.  Lou Claretta HERO, MD 12/31/2024, 8:47 PM [facility] Pager: [phone] Isaiah 41:10   If 7PM-7AM, please contact night-coverage [email or web address] Password [email or web address]     [1] No Known Allergies  "

## 2024-12-31 NOTE — ED Notes (Signed)
 Provider at bedside

## 2024-12-31 NOTE — ED Notes (Signed)
 Pt currently on max dose of Diltiazem  with HR in the 130's. Provider made aware.

## 2025-01-01 DIAGNOSIS — L308 Other specified dermatitis: Secondary | ICD-10-CM | POA: Diagnosis not present

## 2025-01-01 DIAGNOSIS — C3490 Malignant neoplasm of unspecified part of unspecified bronchus or lung: Secondary | ICD-10-CM | POA: Diagnosis not present

## 2025-01-01 DIAGNOSIS — I4892 Unspecified atrial flutter: Secondary | ICD-10-CM | POA: Diagnosis not present

## 2025-01-01 DIAGNOSIS — J42 Unspecified chronic bronchitis: Secondary | ICD-10-CM | POA: Diagnosis not present

## 2025-01-01 DIAGNOSIS — E66813 Obesity, class 3: Secondary | ICD-10-CM | POA: Diagnosis not present

## 2025-01-01 DIAGNOSIS — I5033 Acute on chronic diastolic (congestive) heart failure: Secondary | ICD-10-CM | POA: Diagnosis not present

## 2025-01-01 DIAGNOSIS — I10 Essential (primary) hypertension: Secondary | ICD-10-CM | POA: Diagnosis not present

## 2025-01-01 LAB — BASIC METABOLIC PANEL WITH GFR
Anion gap: 10 (ref 5–15)
BUN: 9 mg/dL (ref 8–23)
CO2: 28 mmol/L (ref 22–32)
Calcium: 9.4 mg/dL (ref 8.9–10.3)
Chloride: 100 mmol/L (ref 98–111)
Creatinine, Ser: 0.75 mg/dL (ref 0.61–1.24)
GFR, Estimated: >60 mL/min
Glucose, Bld: 119 mg/dL — ABNORMAL HIGH (ref 70–99)
Potassium: 4.4 mmol/L (ref 3.5–5.1)
Sodium: 138 mmol/L (ref 135–145)

## 2025-01-01 LAB — HIV ANTIBODY (ROUTINE TESTING W REFLEX): HIV Screen 4th Generation wRfx: NONREACTIVE

## 2025-01-01 LAB — TSH: TSH: 1.01 u[IU]/mL (ref 0.350–4.500)

## 2025-01-01 LAB — MAGNESIUM: Magnesium: 2 mg/dL (ref 1.7–2.4)

## 2025-01-01 LAB — CBC
HCT: 41.8 % (ref 39.0–52.0)
Hemoglobin: 13.2 g/dL (ref 13.0–17.0)
MCH: 30.3 pg (ref 26.0–34.0)
MCHC: 31.6 g/dL (ref 30.0–36.0)
MCV: 95.9 fL (ref 80.0–100.0)
Platelets: 246 K/uL (ref 150–400)
RBC: 4.36 MIL/uL (ref 4.22–5.81)
RDW: 14.3 % (ref 11.5–15.5)
WBC: 8.9 K/uL (ref 4.0–10.5)
nRBC: 0 % (ref 0.0–0.2)

## 2025-01-01 MED ORDER — FUROSEMIDE 10 MG/ML IJ SOLN
40.0000 mg | Freq: Two times a day (BID) | INTRAMUSCULAR | Status: DC
  Administered 2025-01-01 – 2025-01-04 (×6): 40 mg via INTRAVENOUS
  Filled 2025-01-01 (×6): qty 4

## 2025-01-01 MED ORDER — METOPROLOL TARTRATE 25 MG PO TABS
25.0000 mg | ORAL_TABLET | Freq: Three times a day (TID) | ORAL | Status: DC
  Administered 2025-01-01 – 2025-01-03 (×7): 25 mg via ORAL
  Filled 2025-01-01 (×6): qty 1

## 2025-01-01 MED ORDER — FUROSEMIDE 10 MG/ML IJ SOLN
INTRAMUSCULAR | Status: AC
  Administered 2025-01-01: 40 mg
  Filled 2025-01-01: qty 4

## 2025-01-01 MED ORDER — DILTIAZEM HCL ER COATED BEADS 180 MG PO CP24
360.0000 mg | ORAL_CAPSULE | Freq: Every day | ORAL | Status: DC
  Administered 2025-01-01 – 2025-01-04 (×4): 360 mg via ORAL
  Filled 2025-01-01 (×4): qty 2

## 2025-01-01 NOTE — Assessment & Plan Note (Signed)
 Echocardiogram with preserved LV systolic function with EF 55 to 60%, grade I diastolic dysfunction with impaired relaxation, RV systolic function preserved, no significant valvular disease.   Urine output 1,600 ml Systolic blood pressure 120 mmHg range   Plan to continue diuresis with furosemide  40 mg IV bid, possible transition to po tomorrow  Metoprolol  tartrate 25 mg po tid  Possible add RAAS inhibition during this hospitalization

## 2025-01-01 NOTE — Assessment & Plan Note (Signed)
Calculated BMI is 41.6

## 2025-01-01 NOTE — Progress Notes (Signed)
 " Progress Note   Patient: Willie Schmidt FMW:[id number] DOB: 1961/02/28 DOA: 12/31/2024     1 DOS: the patient was seen and examined on 01/01/2025   Brief hospital course: Willie Schmidt was admitted to the hospital with the working diagnosis of atrial fibrillation with rapid ventricular response.   64 yo male with the past medical history of paroxysmal atrial fibrillation, hypertension, obesity class 3 and state IV metastatic lung cancer who presented with palpitations and chest discomfort. Patient reported 10 days of intermittent palpitations, dyspnea on exertion, and lower extremity edema. Apparently has not been adherent to apixaban . On the day of admission he was evaluated at the outpatient cardiology clinic, found in symptomatic atrial fibrillation, and was referred tot he ED for further evaluation.  On his blood pressure was 112/86, HR 144, RR 23 and 02 saturation 100%  Lungs with no wheezing or rhonchi, heart with S1 and S2 present and tachycardic, irregularly irregular, abdomen with no distention and positive lower extremity edema.   Na 137, K 4.1 Cl 102 bicarbonate 24 glucose 142 bun 7 cr 0,71  Mg 1.9  Pro BNP 564  High sensitive troponin < 15 and < 15  Wbc 7.8 hgb 13.1 plt 233   Chest radiograph with cardiomegaly, bilateral hilar vascular congestion, fluid in the right fissure, central bilateral interstitial infiltrates, small left pleural effusion.   EKG 132 bpm, normal axis, normal intervals, qtc 405, atrial flutter rhythm with variable block, no significant ST segment changes, negative T wave V5 and V6.   Patient was placed on IV diltiazem  and IV furosemide    Assessment and Plan: * Acute on chronic diastolic HF (heart failure) (HCC) Echocardiogram with preserved LV systolic function with EF 55 to 60%, grade I diastolic dysfunction with impaired relaxation, RV systolic function preserved, no significant valvular disease.   Patient volume overloaded  Plan to continue diuresis with  furosemide  40 mg IV bid  Plan to add RAAS inhibition during this hospitalization   Atrial flutter with rapid ventricular response (HCC) Patient with improved rate, continue atrial flutter with variable block.  Plan to continue rate control with po metoprolol  25 mg tid and resume diltiazem  360 mg daily Continue anticoagulation with apixaban  (has not been adherent as outpatient)  Plan for TEE cardioversion during this hospitalization  Continue telemetry monitoring   Essential hypertension Continue blood pressure monitoring, if stable, he will benefit from ARB and spironolactone   Chronic bronchitis (HCC) No signs of acute exacerbation   Non-small cell lung cancer with metastasis (HCC) Follow up as outpatient   Psoriasiform dermatitis Continue methotrexate   Obesity, Class III, BMI 40-49.9 (morbid obesity) (HCC) Calculated BMI is 41.6         Subjective: Patient with improved symptoms, but continue with dyspnea and lower extremity edema.   Physical Exam: Vitals:   01/01/25 0330 01/01/25 0444 01/01/25 0807 01/01/25 1042  BP: 121/81 (!) 134/93 107/80 120/78  Pulse: (!) 55 93 (!) 103   Resp: 19 20 20    Temp:  97.8 F (36.6 C) 97.7 F (36.5 C)   TempSrc:  Oral Oral   SpO2: 97% 98% 93%   Weight:      Height:       Neurology awake and alert,  ENT no pallor or icterus Cardiovascular with S1 and S2 present, irregular with no gallops or rubs, no murmurs No JVD Respiratory with rales at bases with no wheezing or rhonchi Abdomen with no distention, protuberant Positive  lower extremity edema +++  Data  Reviewed:    Family Communication: no family at the bedside   Disposition: Status is: Inpatient Remains inpatient appropriate because: IV diuresis possible cardioversion   Planned Discharge Destination: Home    Author: Elidia Toribio Furnace, MD 01/01/2025 11:21 AM  For on call review [email or web address].  "

## 2025-01-01 NOTE — Assessment & Plan Note (Signed)
 Patient with improved rate, continue atrial flutter with variable block.  Plan to continue rate control with po metoprolol  25 mg tid and resume diltiazem  360 mg daily Continue anticoagulation with apixaban  (has not been adherent as outpatient)  Plan for TEE cardioversion during this hospitalization  Continue telemetry monitoring

## 2025-01-01 NOTE — Assessment & Plan Note (Signed)
 Continue blood pressure monitoring, if stable, he will benefit from ARB and spironolactone

## 2025-01-01 NOTE — Progress Notes (Signed)
"  °  Progress Note  Patient Name: Willie Schmidt Date of Encounter: 01/01/2025 [facility] Cardiologist: None   Interval Summary   Not feeling well today, IV infiltrated. I/O not charted but subjectively pt feels swelling improving. Had salt-laden diet over holidays and feels that contributed to HF decompensation.   Vital Signs Vitals:   01/01/25 0300 01/01/25 0330 01/01/25 0444 01/01/25 0807  BP: 131/84 121/81 (!) 134/93 107/80  Pulse: 95 (!) 55 93 (!) 103  Resp: 12 19 20 20   Temp:   97.8 F (36.6 C) 97.7 F (36.5 C)  TempSrc:   Oral Oral  SpO2: 97% 97% 98% 93%  Weight:      Height:       No intake or output data in the 24 hours ending 01/01/25 0918    12/31/2024   10:08 AM 12/31/2024    9:13 AM 11/30/2024    8:49 AM  Last 3 Weights  Weight (lbs) 282 lb 3.2 oz 282 lb 3.2 oz 268 lb  Weight (kg) 128.005 kg 128.005 kg 121.564 kg      Telemetry/ECG  Aflutter RVR rates 80-130s- Personally Reviewed  Physical Exam  GEN: No acute distress.   Neck: No JVD Cardiac: irregular and tachycardic Respiratory: Clear to auscultation bilaterally. GI: Soft, nontender, non-distended  MS: 1-2+ edema  Assessment & Plan  Willie Schmidt is a 64 y.o. male with a hx of recently diagnosed paroxysmal atrial fibrillation, HTN, stage IV metastatic lung cancer followed by [facility], chronic bronchitis who is being seen 12/31/2024 for the evaluation of symptomatic atrial flutter with RVR.  Persistent atrial flutter RVR - will need tee/dccv due to probable missed doses of eliquis . Will reassess EF on TEE. Will attempt for Monday pending schedule. - continue eliquis  5 mg BID - increase metoprolol  to 25 mg TID, dilt gtt off because IV infiltrated. Now back on dilt 360 mg po per primary team, agree.  Acute on chronic diastolic HF - no I/o charted -trops neg x 2 - increase lasix  to 40 mg IV BID, suspect salt load and HF triggered Afib.    Hypertension Currently mildly hypotensive, observe,  will likely improve off of dilt gtt.      For questions or updates, please contact [facility] Please consult [email or web address] for contact info under         Signed, Soyla DELENA Merck, MD   "

## 2025-01-01 NOTE — Progress Notes (Signed)
 BP 107/80, HR 85-105. R arm PIV is infiltrated, cardizem  gtt stopped. PIV removed. MD notified. Pharmacy notified. Heat pack applied. Metoprolol  PO given, lasix  held per MD.  Rayfield LITTIE Ishihara, RN

## 2025-01-01 NOTE — Assessment & Plan Note (Signed)
 Continue methotrexate 

## 2025-01-01 NOTE — Assessment & Plan Note (Signed)
 Follow up as outpatient

## 2025-01-01 NOTE — Hospital Course (Addendum)
 Willie Schmidt was admitted to the hospital with the working diagnosis of atrial flutter with rapid ventricular response.   64 yo male with the past medical history of paroxysmal atrial fibrillation/ flutter, hypertension, obesity class 3 and state IV metastatic lung cancer who presented with palpitations and chest discomfort. Patient reported 10 days of intermittent palpitations, dyspnea on exertion, and lower extremity edema. Apparently has not been adherent to apixaban  at home. On the day of admission he was evaluated at the outpatient cardiology clinic, found in symptomatic atrial fibrillation, and was referred tot he ED for further evaluation.  On his blood pressure was 112/86, HR 144, RR 23 and 02 saturation 100%  Lungs with no wheezing or rhonchi, heart with S1 and S2 present and tachycardic, irregularly irregular, abdomen with no distention and positive lower extremity edema.   Na 137, K 4.1 Cl 102 bicarbonate 24 glucose 142 bun 7 cr 0,71  Mg 1.9  Pro BNP 564  High sensitive troponin < 15 and < 15  Wbc 7.8 hgb 13.1 plt 233   Chest radiograph with cardiomegaly, bilateral hilar vascular congestion, fluid in the right fissure, central bilateral interstitial infiltrates, small left pleural effusion.   EKG 132 bpm, normal axis, normal intervals, qtc 405, atrial flutter rhythm with variable block, no significant ST segment changes, negative T wave V5 and V6.   Patient was placed on IV diltiazem  and IV furosemide   01/11 rate controlled atrial flutter, pending direct current cardioversion  01/12 direct current cardioversion recovering sinus rhythm  01/13 continue with sinus rhythm, plan for outpatient follow up.

## 2025-01-01 NOTE — Assessment & Plan Note (Signed)
Old records personally reviewed, neurology follow up from 09/2021. Seropositive ocular myasthenia gravis, no significant bulbar, or limb weakness noted.  He is not longer on Mestinon or prednisone, never treated with long term steroids sparing agent.   

## 2025-01-02 DIAGNOSIS — L308 Other specified dermatitis: Secondary | ICD-10-CM | POA: Diagnosis not present

## 2025-01-02 DIAGNOSIS — I5033 Acute on chronic diastolic (congestive) heart failure: Secondary | ICD-10-CM | POA: Diagnosis not present

## 2025-01-02 DIAGNOSIS — C3490 Malignant neoplasm of unspecified part of unspecified bronchus or lung: Secondary | ICD-10-CM | POA: Diagnosis not present

## 2025-01-02 DIAGNOSIS — I4892 Unspecified atrial flutter: Secondary | ICD-10-CM | POA: Diagnosis not present

## 2025-01-02 DIAGNOSIS — J42 Unspecified chronic bronchitis: Secondary | ICD-10-CM | POA: Diagnosis not present

## 2025-01-02 DIAGNOSIS — E66813 Obesity, class 3: Secondary | ICD-10-CM | POA: Diagnosis not present

## 2025-01-02 DIAGNOSIS — I10 Essential (primary) hypertension: Secondary | ICD-10-CM | POA: Diagnosis not present

## 2025-01-02 LAB — BASIC METABOLIC PANEL WITH GFR
Anion gap: 12 (ref 5–15)
BUN: 14 mg/dL (ref 8–23)
CO2: 27 mmol/L (ref 22–32)
Calcium: 8.9 mg/dL (ref 8.9–10.3)
Chloride: 100 mmol/L (ref 98–111)
Creatinine, Ser: 0.87 mg/dL (ref 0.61–1.24)
GFR, Estimated: >60 mL/min
Glucose, Bld: 128 mg/dL — ABNORMAL HIGH (ref 70–99)
Potassium: 3.7 mmol/L (ref 3.5–5.1)
Sodium: 138 mmol/L (ref 135–145)

## 2025-01-02 LAB — MAGNESIUM: Magnesium: 1.8 mg/dL (ref 1.7–2.4)

## 2025-01-02 MED ORDER — OXYCODONE-ACETAMINOPHEN 5-325 MG PO TABS: 2.0000 | ORAL_TABLET | Freq: Four times a day (QID) | ORAL | Status: DC | PRN

## 2025-01-02 MED ORDER — SODIUM CHLORIDE 0.9 % IV SOLN: INTRAVENOUS | Status: DC

## 2025-01-02 MED ORDER — HYDROXYZINE HCL 25 MG PO TABS
50.0000 mg | ORAL_TABLET | Freq: Once | ORAL | Status: AC | PRN
  Administered 2025-01-02: 50 mg via ORAL
  Filled 2025-01-02: qty 2

## 2025-01-02 MED ORDER — OXYCODONE-ACETAMINOPHEN 5-325 MG PO TABS
2.0000 | ORAL_TABLET | Freq: Four times a day (QID) | ORAL | Status: DC | PRN
  Administered 2025-01-02 – 2025-01-04 (×5): 2 via ORAL
  Filled 2025-01-02 (×5): qty 2

## 2025-01-02 MED ORDER — MAGNESIUM SULFATE 2 GM/50ML IV SOLN
2.0000 g | Freq: Once | INTRAVENOUS | Status: AC
  Administered 2025-01-02: 2 g via INTRAVENOUS
  Filled 2025-01-02: qty 50

## 2025-01-02 MED ORDER — POTASSIUM CHLORIDE CRYS ER 20 MEQ PO TBCR
40.0000 meq | EXTENDED_RELEASE_TABLET | Freq: Once | ORAL | Status: AC
  Administered 2025-01-02: 40 meq via ORAL
  Filled 2025-01-02: qty 2

## 2025-01-02 NOTE — Plan of Care (Signed)

## 2025-01-02 NOTE — H&P (View-Only) (Signed)
"  °  Progress Note  Patient Name: Willie Schmidt Date of Encounter: 01/02/2025 [facility] Cardiologist: None   Interval Summary   I/O not charted but subjectively pt feels swelling improving. Had salt-laden diet over holidays and feels that contributed to HF decompensation.   Improving today. Rates remain elevated.  Vital Signs Vitals:   01/01/25 2318 01/02/25 0343 01/02/25 0604 01/02/25 0840  BP: (!) 117/91 (!) 122/97  105/70  Pulse: (!) 107 (!) 122  (!) 122  Resp: 18 20  18   Temp: (!) 97.3 F (36.3 C) (!) 97.4 F (36.3 C)  98.6 F (37 C)  TempSrc: Oral Oral  Oral  SpO2: 96% 96%  95%  Weight:   124.1 kg   Height:        Intake/Output Summary (Last 24 hours) at 01/02/2025 0945 Last data filed at 01/01/2025 1800 Gross per 24 hour  Intake 744.89 ml  Output 650 ml  Net 94.89 ml      01/02/2025    6:04 AM 12/31/2024   10:08 AM 12/31/2024    9:13 AM  Last 3 Weights  Weight (lbs) 273 lb 8 oz 282 lb 3.2 oz 282 lb 3.2 oz  Weight (kg) 124.059 kg 128.005 kg 128.005 kg      Telemetry/ECG  Aflutter RVR rates 110-130s- Personally Reviewed  Physical Exam  GEN: No acute distress.   Neck: No JVD Cardiac: irregular and tachycardic Respiratory: Clear to auscultation bilaterally. GI: Soft, nontender, non-distended  MS: 1-2+ edema  Assessment & Plan  Willie Schmidt is a 64 y.o. male with a hx of recently diagnosed paroxysmal atrial fibrillation, HTN, stage IV metastatic lung cancer followed by [facility], chronic bronchitis who is being seen 12/31/2024 for the evaluation of symptomatic atrial flutter with RVR.  Persistent atrial flutter RVR - will need tee/dccv due to probable missed doses of eliquis . Will reassess EF on TEE. Will attempt for Monday pending schedule. - continue eliquis  5 mg BID - increase metoprolol  to 25 mg TID, dilt gtt off because IV infiltrated. Now back on dilt 360 mg po per primary team, agree. Rate control is suboptimal but will hope for  cardioversion with tee tomorrow.   Acute on chronic diastolic HF - no I/o charted -trops neg x 2 - continue lasix  40 mg IV BID, suspect salt load and HF triggered Afib. Cr stable.    Hypertension Currently mildly hypotensive, observe  Informed Consent   Shared Decision Making/Informed Consent   The risks [stroke, cardiac arrhythmias rarely resulting in the need for a temporary or permanent pacemaker, skin irritation or burns, esophageal damage, perforation (1:10,000 risk), bleeding, pharyngeal hematoma as well as other potential complications associated with conscious sedation including aspiration, arrhythmia, respiratory failure and death], benefits (treatment guidance, restoration of normal sinus rhythm, diagnostic support) and alternatives of a transesophageal echocardiogram guided cardioversion were discussed in detail with Willie Schmidt and he is willing to proceed.       For questions or updates, please contact [facility] Please consult [email or web address] for contact info under         Signed, Soyla DELENA Merck, MD   "

## 2025-01-02 NOTE — Progress Notes (Addendum)
"  °  Progress Note  Patient Name: Willie Schmidt Date of Encounter: 01/02/2025 [facility] Cardiologist: None   Interval Summary   I/O not charted but subjectively pt feels swelling improving. Had salt-laden diet over holidays and feels that contributed to HF decompensation.   Improving today. Rates remain elevated.  Vital Signs Vitals:   01/01/25 2318 01/02/25 0343 01/02/25 0604 01/02/25 0840  BP: (!) 117/91 (!) 122/97  105/70  Pulse: (!) 107 (!) 122  (!) 122  Resp: 18 20  18   Temp: (!) 97.3 F (36.3 C) (!) 97.4 F (36.3 C)  98.6 F (37 C)  TempSrc: Oral Oral  Oral  SpO2: 96% 96%  95%  Weight:   124.1 kg   Height:        Intake/Output Summary (Last 24 hours) at 01/02/2025 0945 Last data filed at 01/01/2025 1800 Gross per 24 hour  Intake 744.89 ml  Output 650 ml  Net 94.89 ml      01/02/2025    6:04 AM 12/31/2024   10:08 AM 12/31/2024    9:13 AM  Last 3 Weights  Weight (lbs) 273 lb 8 oz 282 lb 3.2 oz 282 lb 3.2 oz  Weight (kg) 124.059 kg 128.005 kg 128.005 kg      Telemetry/ECG  Aflutter RVR rates 110-130s- Personally Reviewed  Physical Exam  GEN: No acute distress.   Neck: No JVD Cardiac: irregular and tachycardic Respiratory: Clear to auscultation bilaterally. GI: Soft, nontender, non-distended  MS: 1-2+ edema  Assessment & Plan  Willie Schmidt is a 64 y.o. male with a hx of recently diagnosed paroxysmal atrial fibrillation, HTN, stage IV metastatic lung cancer followed by [facility], chronic bronchitis who is being seen 12/31/2024 for the evaluation of symptomatic atrial flutter with RVR.  Persistent atrial flutter RVR - will need tee/dccv due to probable missed doses of eliquis . Will reassess EF on TEE. Will attempt for Monday pending schedule. - continue eliquis  5 mg BID - increase metoprolol  to 25 mg TID, dilt gtt off because IV infiltrated. Now back on dilt 360 mg po per primary team, agree. Rate control is suboptimal but will hope for  cardioversion with tee tomorrow.   Acute on chronic diastolic HF - no I/o charted -trops neg x 2 - continue lasix  40 mg IV BID, suspect salt load and HF triggered Afib. Cr stable.    Hypertension Currently mildly hypotensive, observe  Informed Consent   Shared Decision Making/Informed Consent   The risks [stroke, cardiac arrhythmias rarely resulting in the need for a temporary or permanent pacemaker, skin irritation or burns, esophageal damage, perforation (1:10,000 risk), bleeding, pharyngeal hematoma as well as other potential complications associated with conscious sedation including aspiration, arrhythmia, respiratory failure and death], benefits (treatment guidance, restoration of normal sinus rhythm, diagnostic support) and alternatives of a transesophageal echocardiogram guided cardioversion were discussed in detail with Willie Schmidt and he is willing to proceed.       For questions or updates, please contact [facility] Please consult [email or web address] for contact info under         Signed, Soyla DELENA Merck, MD   "

## 2025-01-02 NOTE — Plan of Care (Signed)
" °  Problem: Education: Goal: Knowledge of General Education information will improve Description: Including pain rating scale, medication(s)/side effects and non-pharmacologic comfort measures Outcome: Progressing   Problem: Health Behavior/Discharge Planning: Goal: Ability to manage health-related needs will improve Outcome: Progressing   Problem: Clinical Measurements: Goal: Ability to maintain clinical measurements within normal limits will improve Outcome: Progressing Goal: Diagnostic test results will improve Outcome: Progressing Goal: Respiratory complications will improve Outcome: Progressing Goal: Cardiovascular complication will be avoided Outcome: Progressing   Problem: Pain Managment: Goal: General experience of comfort will improve and/or be controlled Outcome: Progressing   "

## 2025-01-02 NOTE — Progress Notes (Addendum)
 " Progress Note   Patient: Willie Schmidt FMW:[id number] DOB: 04-27-1961 DOA: 12/31/2024     2 DOS: the patient was seen and examined on 01/02/2025   Brief hospital course: Willie Schmidt was admitted to the hospital with the working diagnosis of atrial fibrillation with rapid ventricular response.   64 yo male with the past medical history of paroxysmal atrial fibrillation, hypertension, obesity class 3 and state IV metastatic lung cancer who presented with palpitations and chest discomfort. Patient reported 10 days of intermittent palpitations, dyspnea on exertion, and lower extremity edema. Apparently has not been adherent to apixaban . On the day of admission he was evaluated at the outpatient cardiology clinic, found in symptomatic atrial fibrillation, and was referred tot he ED for further evaluation.  On his blood pressure was 112/86, HR 144, RR 23 and 02 saturation 100%  Lungs with no wheezing or rhonchi, heart with S1 and S2 present and tachycardic, irregularly irregular, abdomen with no distention and positive lower extremity edema.   Na 137, K 4.1 Cl 102 bicarbonate 24 glucose 142 bun 7 cr 0,71  Mg 1.9  Pro BNP 564  High sensitive troponin < 15 and < 15  Wbc 7.8 hgb 13.1 plt 233   Chest radiograph with cardiomegaly, bilateral hilar vascular congestion, fluid in the right fissure, central bilateral interstitial infiltrates, small left pleural effusion.   EKG 132 bpm, normal axis, normal intervals, qtc 405, atrial flutter rhythm with variable block, no significant ST segment changes, negative T wave V5 and V6.   Patient was placed on IV diltiazem  and IV furosemide   01/11 rate controlled atrial flutter, pending direct current cardioversion   Assessment and Plan: * Acute on chronic diastolic HF (heart failure) (HCC) Echocardiogram with preserved LV systolic function with EF 55 to 60%, grade I diastolic dysfunction with impaired relaxation, RV systolic function preserved, no significant  valvular disease.   Urine output 1,300 ml Systolic blood pressure 110 mmHg range   Plan to continue diuresis with furosemide  40 mg IV bid  Metoprolol  tartrate 25 mg po tid  Plan to add RAAS inhibition during this hospitalization   Atrial flutter with rapid ventricular response (HCC) Patient with improved rate, continue atrial flutter with variable block.  Plan to continue rate control with po metoprolol  25 mg tid and diltiazem  360 mg daily. Continue anticoagulation with apixaban  (has not been adherent as outpatient)  Plan for TEE cardioversion during this hospitalization  Continue telemetry monitoring   Add 40 meq Kcl and 2 g Mg sulfate to avoid hypokalemia and hypomagnesemia.  K today is 3,7 and Mg 1.8   Essential hypertension Continue blood pressure monitoring, if stable, he will benefit from ARB and spironolactone   Chronic bronchitis (HCC) No signs of acute exacerbation   Non-small cell lung cancer with metastasis (HCC) Follow up as outpatient   Psoriasiform dermatitis Continue methotrexate   Obesity, Class III, BMI 40-49.9 (morbid obesity) (HCC) Calculated BMI is 41.6         Subjective: Patient is feeling better, dyspnea and edema have improved, no palpitations or chest pain   Physical Exam: Vitals:   01/01/25 2318 01/02/25 0343 01/02/25 0604 01/02/25 0840  BP: (!) 117/91 (!) 122/97  105/70  Pulse: (!) 107 (!) 122  (!) 122  Resp: 18 20  18   Temp: (!) 97.3 F (36.3 C) (!) 97.4 F (36.3 C)  98.6 F (37 C)  TempSrc: Oral Oral  Oral  SpO2: 96% 96%  95%  Weight:   124.1 kg  Height:       Neurology awake and alert ENT with mild pallor with no icterus Cardiovascular with S1 and S2 present, irregular with no gallops or rubs, no murmurs Respiratory with mild rales at bases with no wheezing or rhonchi Abdomen with no distention, soft and non tender Lower extremity edema ++  Data Reviewed:    Family Communication: no family at the bedside    Disposition: Status is: Inpatient Remains inpatient appropriate because: IV diuresis, pending direct current cardioversion   Planned Discharge Destination: Home     Author: Elidia Toribio Furnace, MD 01/02/2025 11:08 AM  For on call review [email or web address].  "

## 2025-01-03 ENCOUNTER — Other Ambulatory Visit (HOSPITAL_COMMUNITY): Payer: Self-pay

## 2025-01-03 ENCOUNTER — Encounter (HOSPITAL_COMMUNITY): Payer: Self-pay | Admitting: Student in an Organized Health Care Education/Training Program

## 2025-01-03 ENCOUNTER — Inpatient Hospital Stay (HOSPITAL_COMMUNITY): Payer: MEDICAID | Admitting: Certified Registered"

## 2025-01-03 ENCOUNTER — Encounter (HOSPITAL_COMMUNITY): Admission: EM | Disposition: A | Payer: Self-pay | Source: Home / Self Care | Attending: Student

## 2025-01-03 ENCOUNTER — Encounter: Payer: Self-pay | Admitting: Internal Medicine

## 2025-01-03 ENCOUNTER — Inpatient Hospital Stay (HOSPITAL_COMMUNITY): Payer: MEDICAID

## 2025-01-03 DIAGNOSIS — J42 Unspecified chronic bronchitis: Secondary | ICD-10-CM | POA: Diagnosis not present

## 2025-01-03 DIAGNOSIS — I5033 Acute on chronic diastolic (congestive) heart failure: Secondary | ICD-10-CM

## 2025-01-03 DIAGNOSIS — I4892 Unspecified atrial flutter: Secondary | ICD-10-CM | POA: Diagnosis not present

## 2025-01-03 DIAGNOSIS — Z87891 Personal history of nicotine dependence: Secondary | ICD-10-CM

## 2025-01-03 DIAGNOSIS — I10 Essential (primary) hypertension: Secondary | ICD-10-CM | POA: Diagnosis not present

## 2025-01-03 DIAGNOSIS — I110 Hypertensive heart disease with heart failure: Secondary | ICD-10-CM | POA: Diagnosis not present

## 2025-01-03 DIAGNOSIS — I340 Nonrheumatic mitral (valve) insufficiency: Secondary | ICD-10-CM

## 2025-01-03 DIAGNOSIS — I361 Nonrheumatic tricuspid (valve) insufficiency: Secondary | ICD-10-CM

## 2025-01-03 DIAGNOSIS — I4891 Unspecified atrial fibrillation: Secondary | ICD-10-CM

## 2025-01-03 HISTORY — PX: CARDIOVERSION: EP1203

## 2025-01-03 HISTORY — PX: TRANSESOPHAGEAL ECHOCARDIOGRAM (CATH LAB): EP1270

## 2025-01-03 LAB — BASIC METABOLIC PANEL WITH GFR
Anion gap: 12 (ref 5–15)
BUN: 16 mg/dL (ref 8–23)
CO2: 27 mmol/L (ref 22–32)
Calcium: 9.2 mg/dL (ref 8.9–10.3)
Chloride: 100 mmol/L (ref 98–111)
Creatinine, Ser: 0.85 mg/dL (ref 0.61–1.24)
GFR, Estimated: >60 mL/min
Glucose, Bld: 108 mg/dL — ABNORMAL HIGH (ref 70–99)
Potassium: 4 mmol/L (ref 3.5–5.1)
Sodium: 138 mmol/L (ref 135–145)

## 2025-01-03 LAB — CBC
HCT: 39.7 % (ref 39.0–52.0)
Hemoglobin: 12.6 g/dL — ABNORMAL LOW (ref 13.0–17.0)
MCH: 30.3 pg (ref 26.0–34.0)
MCHC: 31.7 g/dL (ref 30.0–36.0)
MCV: 95.4 fL (ref 80.0–100.0)
Platelets: 234 K/uL (ref 150–400)
RBC: 4.16 MIL/uL — ABNORMAL LOW (ref 4.22–5.81)
RDW: 14.1 % (ref 11.5–15.5)
WBC: 8.5 K/uL (ref 4.0–10.5)
nRBC: 0 % (ref 0.0–0.2)

## 2025-01-03 LAB — ECHO TEE

## 2025-01-03 MED ORDER — PHENYLEPHRINE 80 MCG/ML (10ML) SYRINGE FOR IV PUSH (FOR BLOOD PRESSURE SUPPORT)
PREFILLED_SYRINGE | INTRAVENOUS | Status: DC | PRN
  Administered 2025-01-03: 80 ug via INTRAVENOUS

## 2025-01-03 MED ORDER — LIDOCAINE 2% (20 MG/ML) 5 ML SYRINGE
INTRAMUSCULAR | Status: DC | PRN
  Administered 2025-01-03: 100 mg via INTRAVENOUS

## 2025-01-03 MED ORDER — BUTAMBEN-TETRACAINE-BENZOCAINE 2-2-14 % EX AERO
INHALATION_SPRAY | CUTANEOUS | Status: AC
  Filled 2025-01-03: qty 20

## 2025-01-03 MED ORDER — METOPROLOL SUCCINATE ER 50 MG PO TB24
75.0000 mg | ORAL_TABLET | Freq: Every day | ORAL | Status: DC
  Administered 2025-01-03 – 2025-01-04 (×2): 75 mg via ORAL
  Filled 2025-01-03 (×2): qty 1

## 2025-01-03 MED ORDER — EMPAGLIFLOZIN 10 MG PO TABS
10.0000 mg | ORAL_TABLET | Freq: Every day | ORAL | Status: DC
  Administered 2025-01-03 – 2025-01-04 (×2): 10 mg via ORAL
  Filled 2025-01-03 (×2): qty 1

## 2025-01-03 MED ORDER — PROPOFOL 10 MG/ML IV BOLUS
INTRAVENOUS | Status: DC | PRN
  Administered 2025-01-03: 40 mg via INTRAVENOUS

## 2025-01-03 MED ORDER — BUTAMBEN-TETRACAINE-BENZOCAINE 2-2-14 % EX AERO
INHALATION_SPRAY | CUTANEOUS | Status: DC | PRN
  Administered 2025-01-03: 1 via TOPICAL

## 2025-01-03 MED ORDER — PROPOFOL 500 MG/50ML IV EMUL
INTRAVENOUS | Status: DC | PRN
  Administered 2025-01-03: 150 ug/kg/min via INTRAVENOUS

## 2025-01-03 NOTE — Anesthesia Preprocedure Evaluation (Signed)
"                                    Anesthesia Evaluation  Patient identified by MRN, date of birth, ID band Patient awake    Reviewed: Allergy & Precautions, H&P , NPO status , Patient's Chart, lab work & pertinent test results  Airway Mallampati: II  TM Distance: >3 FB Neck ROM: Full    Dental  (+) Dental Advisory Given   Pulmonary neg pulmonary ROS, former smoker   Pulmonary exam normal breath sounds clear to auscultation       Cardiovascular hypertension, Pt. on medications Normal cardiovascular exam+ dysrhythmias Atrial Fibrillation  Rhythm:Regular Rate:Normal     Neuro/Psych negative neurological ROS  negative psych ROS   GI/Hepatic negative GI ROS, Neg liver ROS,,,  Endo/Other    Class 3 obesity  Renal/GU negative Renal ROS  negative genitourinary   Musculoskeletal negative musculoskeletal ROS (+)    Abdominal   Peds negative pediatric ROS (+)  Hematology negative hematology ROS (+)   Anesthesia Other Findings   Reproductive/Obstetrics negative OB ROS                              Anesthesia Physical Anesthesia Plan  ASA: 3  Anesthesia Plan: MAC   Post-op Pain Management: Minimal or no pain anticipated   Induction: Intravenous  PONV Risk Score and Plan: 1 and Propofol  infusion and Treatment may vary due to age or medical condition  Airway Management Planned: Nasal Cannula  Additional Equipment:   Intra-op Plan:   Post-operative Plan:   Informed Consent: I have reviewed the patients History and Physical, chart, labs and discussed the procedure including the risks, benefits and alternatives for the proposed anesthesia with the patient or authorized representative who has indicated his/her understanding and acceptance.     Dental advisory given  Plan Discussed with: CRNA  Anesthesia Plan Comments:         Anesthesia Quick Evaluation  "

## 2025-01-03 NOTE — Progress Notes (Signed)
 Heart Failure Navigator Progress Note  Assessed for Heart & Vascular TOC clinic readiness.  Patient does not meet criteria due to per MD note , patient with history of Metastatic Lung Cancer. No HF TOC. .   Navigator will sign off at this time.   Stephane Haddock, BSN, Scientist, Clinical (histocompatibility And Immunogenetics) Only

## 2025-01-03 NOTE — Interval H&P Note (Signed)
 History and Physical Interval Note:  01/03/2025 9:38 AM  Willie Schmidt  has presented today for surgery, with the diagnosis of aflutter.  The various methods of treatment have been discussed with the patient and family. After consideration of risks, benefits and other options for treatment, the patient has consented to  Procedures: TRANSESOPHAGEAL ECHOCARDIOGRAM (N/A) CARDIOVERSION (N/A) as a surgical intervention.  The patient's history has been reviewed, patient examined, no change in status, stable for surgery.  I have reviewed the patient's chart and labs.  Questions were answered to the patient's satisfaction.     Georganna Archer

## 2025-01-03 NOTE — Transfer of Care (Signed)
 Immediate Anesthesia Transfer of Care Note  Patient: Willie Schmidt  Procedure(s) Performed: TRANSESOPHAGEAL ECHOCARDIOGRAM CARDIOVERSION  Patient Location: PACU and Cath Lab  Anesthesia Type:MAC  Level of Consciousness: awake  Airway & Oxygen Therapy: Patient Spontanous Breathing  Post-op Assessment: Report given to RN and Post -op Vital signs reviewed and stable  Post vital signs: Reviewed and stable  Last Vitals:  Vitals Value Taken Time  BP    Temp    Pulse    Resp    SpO2      Last Pain:  Vitals:   01/03/25 0917  TempSrc: Temporal  PainSc:       Patients Stated Pain Goal: 2 (01/01/25 2209)  Complications: No notable events documented.

## 2025-01-03 NOTE — Progress Notes (Addendum)
 "  Progress Note  Patient Name: Willie Schmidt Date of Encounter: 01/03/2025 [facility] Cardiologist: Soyla DELENA Merck, MD   Interval Summary    64 year old male with PMH of stage IV metastatic lung cancer, chronic bronchitis, hypertension, paroxysmal atrial fibrillation, who is currently admitted for persistent atrial flutter with RVR.  On interview today, patient states he is feeling well, not normally aware of his A fib. He said he did not feel right when he first came in but not sure what was bothering him. He states his legs were swelling for a few weeks. He denied any chest pain. He is not getting any treatment for his cancer and is trusting God to take care of him. He offers no question for TEE [facility] today and is agreeable with the procedure. He possibly missed eliquis  at some point. He denied any hematemesis, melena, BRBPR.   Vital Signs Vitals:   01/02/25 2056 01/02/25 2343 01/03/25 0458 01/03/25 0500  BP: 122/86 113/85 122/89   Pulse: (!) 25 (!) 56 (!) 57   Resp: 20 20 18    Temp: 98.1 F (36.7 C) 97.6 F (36.4 C) 98 F (36.7 C)   TempSrc: Oral Oral Oral   SpO2: 96% 96% 96%   Weight:    123.8 kg  Height:        Intake/Output Summary (Last 24 hours) at 01/03/2025 9287 Last data filed at 01/02/2025 1829 Gross per 24 hour  Intake 240 ml  Output 1300 ml  Net -1060 ml      01/03/2025    5:00 AM 01/02/2025    6:04 AM 12/31/2024   10:08 AM  Last 3 Weights  Weight (lbs) 273 lb 273 lb 8 oz 282 lb 3.2 oz  Weight (kg) 123.832 kg 124.059 kg 128.005 kg      Telemetry/ECG  A fib/flutter with RVR 100-120s  - Personally Reviewed  Physical Exam  GEN: No acute distress.  Obese  Neck: No JVD Cardiac: Irregularly irregular, no murmurs, rubs, or gallops.  Respiratory: Clear to auscultation bilaterally. On room air. Speaks full sentence  GI: Soft, nontender, non-distended  MS: 1- BLE edema, chronic venous stasis changes noted  Assessment & Plan   Atrial  fibrillation/flutter RVR -Initially diagnosed with A-fib RVR 10/2024 in the setting of pneumonia and was placed on metoprolol  and Cardizem  and Eliquis ; Echo from 11/09/2024 with LVEF 55 to 60%, grade 1 DD, low normal RV, trivial MR, aortic sclerosis; followed up with A-fib clinic 11/30/2024, continued on Cardizem  360 mg daily and metoprolol  25 mg twice daily and Eliquis  5 mg twice daily, 2-week ZIO monitor revealing nonsustained SVT and no further atrial fibrillation; on follow-up visit with cardiology he was referred to EP for further evaluation of SVT -Now presented with DOE and pitting edema with A-fib RVR on 12/31/2024, proBNP 564, high sensitive troponin <15 x2, CXR showed mild cardiomegaly.  EKG showed atrial flutter with variable AV block -Rate control has been suboptimal with metoprolol  and diltiazem , he is arranged for TEE/DCCV today, please maintain n.p.o., he is agreeable and offered no question today  -Continue Eliquis  5 mg twice daily, he potentially missed dose in the past, discussed bleeding precaution   Acute diastolic heart failure -See history and labs as mentioned above -Currently diuresed with IV Lasix  40 mg twice daily, net - , weight is down from 282 >273ib, clinical exam reveals mild hypervolemia, l chronic venous stasis likely contributing to leg edema, recommend continue IV lasix  for today, likely can transition to  PO tomorrow  - GDMT: May trial SGLT2 at the time of discharge.  Hypertension Non-small cell lung cancer with metastasis -Per primary team   For questions or updates, please contact [facility] Please consult [email or web address] for contact info under         Signed, Xika Zhao, NP   Patient seen and examined, note reviewed with the signed Advanced Practice Provider. I personally reviewed laboratory data, imaging studies and relevant notes. I independently examined the patient and formulated the important aspects of the plan. I have personally discussed  the plan with the patient and/or family. Comments or changes to the note/plan are indicated below.  Patient profile: 64 year old male with a history of stage IV metastatic lung cancer, chronic bronchitis, hypertension, paroxysmal atrial fibrillation and flutter admitted with persistent atrial flutter with RVR. He underwent successful TEE/CV this morning.   Doing well today post procedure and without any complaints.   My Exam:  Physical Exam Vitals and nursing note reviewed.  Constitutional:      Appearance: Normal appearance.  HENT:     Head: Normocephalic and atraumatic.  Eyes:     Conjunctiva/sclera: Conjunctivae normal.  Cardiovascular:     Rate and Rhythm: Normal rate. Rhythm irregular.  Pulmonary:     Effort: Pulmonary effort is normal.     Breath sounds: Normal breath sounds.  Musculoskeletal:        General: No swelling or tenderness.  Skin:    Coloration: Skin is not jaundiced or pale.  Neurological:     Mental Status: He is alert.      Telemetry: NSR with PACs - Personally reviewed   Assessment & Plan:  Paroxysmal atrial fibrillation and flutter, now s/p successful TEE/CV 01/03/25 -  on eliquis  5 mg BID, diltiazem  360 mg daily and metoprolol  tartrate 25 mg TID. Will change to toprol  XL 75 mg daily Acute diastolic heart failure - TEE with mildly reduced EF. lasix  40 mg BID IV. Weight improving and net -2.1 L. Can change to lasix  40 mg PO tomorrow and add Jardiance  10 mg daily today. Hypertension  NSCLC with metastasis   Signed, Emeline Calender, DO   [facility]  01/03/2025 11:05 AM      "

## 2025-01-03 NOTE — CV Procedure (Signed)
" ° °  TRANSESOPHAGEAL ECHOCARDIOGRAM GUIDED DIRECT CURRENT CARDIOVERSION  NAME:  Willie Schmidt    MRN: [id number] DOB:  Oct 11, 1961    ADMIT DATE: 12/31/2024  INDICATIONS: Symptomatic atrial flutter  PROCEDURE:   Informed consent was obtained prior to the procedure. The risks, benefits and alternatives for the procedure were discussed and the patient comprehended these risks.  Risks include, but are not limited to, cough, sore throat, vomiting, nausea, somnolence, esophageal and stomach trauma or perforation, bleeding, low blood pressure, aspiration, pneumonia, infection, trauma to the teeth and death.    After a procedural time-out, the oropharynx was anesthetized and the patient was sedated by the anesthesia service. The transesophageal probe was inserted in the esophagus and stomach without difficulty and multiple views were obtained. Anesthesia was monitored by the anesthesiology team.  COMPLICATIONS:    Complications: No complications Patient tolerated procedure well.  KEY FINDINGS:  No LA/LAA thrombus.  Full Report to follow.   CARDIOVERSION:     Indications:  Symptomatic Atrial Flutter  Procedure Details:  Once the TEE was complete, the patient had the defibrillator pads placed in the anterior and posterior position. Once an appropriate level of sedation was confirmed, the patient was cardioverted  x1  with 360J of biphasic synchronized energy.  The patient converted to NSR.  There were no apparent complications.  The patient had normal neuro status and respiratory status post procedure with vitals stable as recorded elsewhere.  Adequate airway was maintained throughout and vital signs monitored per protocol.  The was advised to continue anticoagulation for minimum 4 week with interruption.   All questions has been answered at this time.  Kurt Hoffmeier T. Floretta HEATH, MD [place]  [facility]  10:07 AM  "

## 2025-01-03 NOTE — Anesthesia Postprocedure Evaluation (Signed)
"   Anesthesia Post Note  Patient: Willie Schmidt  Procedure(s) Performed: TRANSESOPHAGEAL ECHOCARDIOGRAM CARDIOVERSION     Patient location during evaluation: PACU Anesthesia Type: MAC Level of consciousness: awake and alert Pain management: pain level controlled Vital Signs Assessment: post-procedure vital signs reviewed and stable Respiratory status: spontaneous breathing, nonlabored ventilation and respiratory function stable Cardiovascular status: blood pressure returned to baseline and stable Postop Assessment: no apparent nausea or vomiting Anesthetic complications: no   No notable events documented.  Last Vitals:  Vitals:   01/03/25 1050 01/03/25 1103  BP:  129/89  Pulse: 95 92  Resp: (!) 26 20  Temp:  (!) 36.3 C  SpO2: 97% 100%    Last Pain:  Vitals:   01/03/25 1103  TempSrc: Oral  PainSc: 0-No pain                 Butler Levander Pinal      "

## 2025-01-03 NOTE — Progress Notes (Addendum)
 " Progress Note   Patient: Willie Schmidt FMW:[id number] DOB: Sep 21, 1961 DOA: 12/31/2024     3 DOS: the patient was seen and examined on 01/03/2025   Brief hospital course: Mr. Willie Schmidt was admitted to the hospital with the working diagnosis of atrial fibrillation with rapid ventricular response.   64 yo male with the past medical history of paroxysmal atrial fibrillation, hypertension, obesity class 3 and state IV metastatic lung cancer who presented with palpitations and chest discomfort. Patient reported 10 days of intermittent palpitations, dyspnea on exertion, and lower extremity edema. Apparently has not been adherent to apixaban . On the day of admission he was evaluated at the outpatient cardiology clinic, found in symptomatic atrial fibrillation, and was referred tot he ED for further evaluation.  On his blood pressure was 112/86, HR 144, RR 23 and 02 saturation 100%  Lungs with no wheezing or rhonchi, heart with S1 and S2 present and tachycardic, irregularly irregular, abdomen with no distention and positive lower extremity edema.   Na 137, K 4.1 Cl 102 bicarbonate 24 glucose 142 bun 7 cr 0,71  Mg 1.9  Pro BNP 564  High sensitive troponin < 15 and < 15  Wbc 7.8 hgb 13.1 plt 233   Chest radiograph with cardiomegaly, bilateral hilar vascular congestion, fluid in the right fissure, central bilateral interstitial infiltrates, small left pleural effusion.   EKG 132 bpm, normal axis, normal intervals, qtc 405, atrial flutter rhythm with variable block, no significant ST segment changes, negative T wave V5 and V6.   Patient was placed on IV diltiazem  and IV furosemide   01/11 rate controlled atrial flutter, pending direct current cardioversion  01/12 direct current cardioversion recovering sinus rhythm   Assessment and Plan: * Acute on chronic diastolic HF (heart failure) (HCC) Echocardiogram with preserved LV systolic function with EF 55 to 60%, grade I diastolic dysfunction with impaired  relaxation, RV systolic function preserved, no significant valvular disease.   Urine output 1,600 ml Systolic blood pressure 120 mmHg range   Plan to continue diuresis with furosemide  40 mg IV bid, possible transition to po tomorrow  Metoprolol  tartrate 25 mg po tid  Possible add RAAS inhibition during this hospitalization   Atrial flutter with rapid ventricular response (HCC) 01/12 direct current cardioversion, recovering sinus rhythm.   Plan to continue rate control with po metoprolol  25 mg tid and diltiazem  360 mg daily. Continue anticoagulation with apixaban   Continue telemetry monitoring   Keep K at 4 and Mg at 2  Essential hypertension Continue blood pressure monitoring, if stable, he will benefit from ARB and spironolactone   Chronic bronchitis (HCC) No signs of acute exacerbation   Non-small cell lung cancer with metastasis (HCC) Follow up as outpatient   Psoriasiform dermatitis Continue methotrexate   Obesity, Class III, BMI 40-49.9 (morbid obesity) (HCC) Calculated BMI is 41.6         Subjective: Patient with no chest pain or dyspnea, he had cardioversion this morning with good toleration   Physical Exam: Vitals:   01/03/25 1045 01/03/25 1050 01/03/25 1103 01/03/25 1200  BP: (!) 138/100  129/89 (!) 112/90  Pulse: 89 95 92 87  Resp: (!) 26 (!) 26 20 18   Temp:   (!) 97.3 F (36.3 C) 97.9 F (36.6 C)  TempSrc:   Oral Oral  SpO2: 97% 97% 100% 99%  Weight:      Height:       Neurology awake and alert ENT with no pallor or icterus Cardiovascular with S1 and S2  present and regular with no gallops, rubs or murmurs Respiratory with no wheezing or rhonchi, no rales Abdomen with no distention, soft and non tender Lower extremity edema +   Data Reviewed:    Family Communication: no family at the bedside   Disposition: Status is: Inpatient Remains inpatient appropriate because: IV diuresis   Planned Discharge Destination:  Home     Author: Elidia Toribio Furnace, MD 01/03/2025 2:51 PM  For on call review [email or web address].  "

## 2025-01-04 ENCOUNTER — Telehealth (HOSPITAL_COMMUNITY): Payer: Self-pay

## 2025-01-04 ENCOUNTER — Other Ambulatory Visit (HOSPITAL_COMMUNITY): Payer: Self-pay

## 2025-01-04 DIAGNOSIS — I4892 Unspecified atrial flutter: Secondary | ICD-10-CM | POA: Diagnosis not present

## 2025-01-04 DIAGNOSIS — I5033 Acute on chronic diastolic (congestive) heart failure: Secondary | ICD-10-CM | POA: Diagnosis not present

## 2025-01-04 DIAGNOSIS — J42 Unspecified chronic bronchitis: Secondary | ICD-10-CM | POA: Diagnosis not present

## 2025-01-04 DIAGNOSIS — I10 Essential (primary) hypertension: Secondary | ICD-10-CM | POA: Diagnosis not present

## 2025-01-04 LAB — BASIC METABOLIC PANEL WITH GFR
Anion gap: 12 (ref 5–15)
BUN: 20 mg/dL (ref 8–23)
CO2: 27 mmol/L (ref 22–32)
Calcium: 9.1 mg/dL (ref 8.9–10.3)
Chloride: 100 mmol/L (ref 98–111)
Creatinine, Ser: 0.89 mg/dL (ref 0.61–1.24)
GFR, Estimated: >60 mL/min
Glucose, Bld: 132 mg/dL — ABNORMAL HIGH (ref 70–99)
Potassium: 3.6 mmol/L (ref 3.5–5.1)
Sodium: 138 mmol/L (ref 135–145)

## 2025-01-04 LAB — MAGNESIUM: Magnesium: 2 mg/dL (ref 1.7–2.4)

## 2025-01-04 MED ORDER — EMPAGLIFLOZIN 10 MG PO TABS
10.0000 mg | ORAL_TABLET | Freq: Every day | ORAL | 0 refills | Status: AC
  Filled 2025-01-04: qty 30, 30d supply, fill #0

## 2025-01-04 MED ORDER — POTASSIUM CHLORIDE CRYS ER 20 MEQ PO TBCR
40.0000 meq | EXTENDED_RELEASE_TABLET | Freq: Once | ORAL | Status: AC
  Administered 2025-01-04: 40 meq via ORAL
  Filled 2025-01-04: qty 2

## 2025-01-04 MED ORDER — APIXABAN 5 MG PO TABS
5.0000 mg | ORAL_TABLET | Freq: Two times a day (BID) | ORAL | 0 refills | Status: AC
  Filled 2025-01-04: qty 60, 30d supply, fill #0

## 2025-01-04 MED ORDER — METOPROLOL SUCCINATE ER 25 MG PO TB24
75.0000 mg | ORAL_TABLET | Freq: Every day | ORAL | 0 refills | Status: DC
  Filled 2025-01-04: qty 90, 30d supply, fill #0

## 2025-01-04 MED ORDER — FUROSEMIDE 40 MG PO TABS
40.0000 mg | ORAL_TABLET | Freq: Every day | ORAL | 0 refills | Status: AC
  Filled 2025-01-04: qty 30, 30d supply, fill #0

## 2025-01-04 MED ORDER — FUROSEMIDE 40 MG PO TABS: 40.0000 mg | ORAL_TABLET | Freq: Every day | ORAL | Status: DC

## 2025-01-04 MED ORDER — DILTIAZEM HCL ER COATED BEADS 360 MG PO CP24
360.0000 mg | ORAL_CAPSULE | Freq: Every day | ORAL | 0 refills | Status: AC
  Filled 2025-01-04: qty 30, 30d supply, fill #0

## 2025-01-04 NOTE — Progress Notes (Signed)
" °   01/04/25 1230  TOC Brief Assessment  Insurance and Status Reviewed  Patient has primary care physician Yes  Home environment has been reviewed home  Prior level of function: independent  Prior/Current Home Services No current home services  Social Drivers of Health Review SDOH reviewed no interventions necessary  Readmission risk has been reviewed Yes  Transition of care needs no transition of care needs at this time    Pt stable for transition home today, no HH or DME needs noted.  "

## 2025-01-04 NOTE — Progress Notes (Signed)
Discharge instructions provided to patient. All medications, follow up appointments, and discharge instructions provided. IV out. Monitor off CCMD notified. Discharging home.

## 2025-01-04 NOTE — Plan of Care (Signed)
   Problem: Clinical Measurements: Goal: Will remain free from infection Outcome: Progressing Goal: Diagnostic test results will improve Outcome: Progressing

## 2025-01-04 NOTE — Telephone Encounter (Signed)
 Pharmacy Patient Advocate Encounter  Received notification from [employer] [place] [employer] that Prior Authorization for Jardiance  10MG  tablets has been APPROVED from 01/04/25 to 01/04/26. Ran test claim, Copay is $4. This test claim was processed through [facility]- copay amounts may vary at other pharmacies due to pharmacy/plan contracts, or as the patient moves through the different stages of their insurance plan.   PA #/Case ID/Reference #: [id number]

## 2025-01-04 NOTE — Progress Notes (Addendum)
 "  Progress Note  Patient Name: Willie Schmidt Date of Encounter: 01/04/2025 [facility] Cardiologist: Soyla DELENA Merck, MD   Interval Summary    64 year old male with PMH of stage IV metastatic lung cancer, chronic bronchitis, hypertension, paroxysmal atrial fibrillation, who is currently admitted for persistent atrial flutter with RVR.  On interview today, patient states he feels well. He is ready to go home. He denied any SOB or chest pain. He is sitting in bed with stress clothes on.   Vital Signs Vitals:   01/03/25 2025 01/04/25 0004 01/04/25 0454 01/04/25 0500  BP: (!) 134/97 101/73 (!) 155/107   Pulse: 84 82 84   Resp: 18 18 18    Temp: 97.6 F (36.4 C) 98.1 F (36.7 C) (!) 97.5 F (36.4 C)   TempSrc: Oral Oral Oral   SpO2: 99% 92% 94%   Weight:    124.3 kg  Height:        Intake/Output Summary (Last 24 hours) at 01/04/2025 0813 Last data filed at 01/04/2025 0500 Gross per 24 hour  Intake 200 ml  Output 2200 ml  Net -2000 ml      01/04/2025    5:00 AM 01/03/2025    5:00 AM 01/02/2025    6:04 AM  Last 3 Weights  Weight (lbs) 274 lb 0.5 oz 273 lb 273 lb 8 oz  Weight (kg) 124.3 kg 123.832 kg 124.059 kg      Telemetry/ECG  Sinus rhythm, occasional PACs  - Personally Reviewed  Physical Exam  GEN: No acute distress.  Obese  Neck: No JVD Cardiac: RRR, no murmurs, rubs, or gallops.  Respiratory: Clear to auscultation bilaterally. On room air. Speaks full sentence  GI: Soft, nontender, non-distended  MS: no pitting edema of BLE, chronic venous stasis changes noted  Assessment & Plan   Atrial fibrillation/flutter RVR -Initially diagnosed with A-fib RVR 10/2024 in the setting of pneumonia and was placed on metoprolol  and Cardizem  and Eliquis ; Echo from 11/09/2024 with LVEF 55 to 60%, grade 1 DD, low normal RV, trivial MR, aortic sclerosis; followed up with A-fib clinic 11/30/2024, continued on Cardizem  360 mg daily and metoprolol  25 mg twice daily and Eliquis   5 mg twice daily, 2-week ZIO monitor revealing nonsustained SVT and no further atrial fibrillation; on follow-up visit with cardiology he was referred to EP for further evaluation of SVT -Now presented with DOE and pitting edema with A-fib RVR on 12/31/2024, proBNP 564, high sensitive troponin <15 x2, CXR showed mild cardiomegaly.  EKG showed atrial flutter with variable AV block -Rate control has been suboptimal with metoprolol  and diltiazem , he is s/p TEE/DCCV yesterday with conversion to NSR, he remains SR, will discharge on Cardizem  CD 360mg  and metoprolol  XL 75mg  daily, follow up appointment with A fib clinic arranged on 01/06/25, may discuss other options for A fib , suspect it will recur  -Continue Eliquis  5 mg twice daily, discussed bleeding precaution   Acute diastolic heart failure -See history and labs as mentioned above -On PTA Lasix  40mg  daily, diuresed with IV Lasix  40 mg twice daily, net -2.79ml, weight is down from 282 >274ib, clinical exam today he is euvolemic,  chronic venous stasis likely contributing to leg edema, will transition to PO lasix  40mg  daily, discussed salt and diet changes for CHF  - GDMT:started Jardiance  10mg  daily yesterday, tolerating, reviewed side effect, needs BMP at next follow up appointment, arranged on 01/19/25  Hypertension Non-small cell lung cancer with metastasis -Per primary team  For questions or updates, please contact [facility] Please consult [email or web address] for contact info under       Signed, Xika Zhao, NP    Patient seen and examined, note reviewed with the signed Advanced Practice Provider. I personally reviewed laboratory data, imaging studies and relevant notes. I independently examined the patient and formulated the important aspects of the plan. I have personally discussed the plan with the patient and/or family. Comments or changes to the note/plan are indicated below.  Patient profile: 64 year old male with a history of  stage IV metastatic lung cancer, chronic bronchitis, hypertension, paroxysmal atrial fibrillation and flutter admitted with persistent atrial flutter with RVR. He underwent successful TEE/CV 01/03/25.   My Exam:  Physical Exam Vitals and nursing note reviewed.  Constitutional:      Appearance: Normal appearance.  HENT:     Head: Normocephalic and atraumatic.  Eyes:     Conjunctiva/sclera: Conjunctivae normal.  Cardiovascular:     Rate and Rhythm: Normal rate. Rhythm irregular.  Pulmonary:     Effort: Pulmonary effort is normal.     Breath sounds: Normal breath sounds.  Musculoskeletal:        General: No swelling or tenderness.  Skin:    Coloration: Skin is not jaundiced or pale.  Neurological:     Mental Status: He is alert.      Telemetry: NSR with PACs - Personally reviewed   Assessment & Plan:  Paroxysmal atrial fibrillation and flutter, now s/p successful TEE/CV 01/03/25 -  currently NSR with PACs. on eliquis  5 mg BID, diltiazem  360 mg daily and metoprolol  succinate 75 mg daily. Acute diastolic heart failure, resolved - euvolemic. Tolerating Jardiance  added this hospitalization. Change to lasix  40 mg PO daily.  Hypertension  NSCLC with metastasis    Follow up has been arranged in the afib clinic. Continue metoprolol  succinate 75 mg, Diltiazem  360 mg, Eliquis  5 mg Bid, Jardiance  10 mg vs. Farxiga 10 mg pending price check. Cardiology will sign off.   Bonney Emeline Calender, DO [place]  [facility]  01/04/2025 9:06 AM    "

## 2025-01-04 NOTE — Discharge Summary (Signed)
 " Physician Discharge Summary   Patient: Willie Schmidt MRN: [id number] DOB: 27-Jun-1961  Admit date:     12/31/2024  Discharge date: 01/04/2025  Discharge Physician: [name] [name]   PCP: [name], MD   Recommendations at discharge:    Patient will continue taking diltiazem  and dose of metoprolol  has been increased.  Guideline directed medical therapy with metoprolol  succinate and SGLT 2 inh, to consider adding RAAS inhibition as outpatient Diuresis with furosemide  40 mg daily He has been advised to stay adherent to medical therapy, including apixaban .  Follow up renal function and electrolytes in 7 days as outpatient Follow up with Dr. 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 [facility] Course: [name] was admitted to the hospital with the working diagnosis of atrial flutter with rapid ventricular response.   64 yo male with the past medical history of paroxysmal atrial fibrillation/ flutter, hypertension, obesity class 3 and state IV metastatic lung cancer who presented with palpitations and chest discomfort. Patient reported 10 days of intermittent palpitations, dyspnea on exertion, and lower extremity edema. Apparently has not been adherent to apixaban  at home. On the day of admission he was evaluated at the outpatient cardiology clinic, found in symptomatic atrial fibrillation, and was referred tot he ED for further evaluation.  On his blood pressure was 112/86, HR 144, RR 23 and 02 saturation 100%  Lungs with no wheezing or rhonchi, heart with S1 and S2 present and tachycardic,  irregularly irregular, abdomen with no distention and positive lower extremity edema.   Na 137, K 4.1 Cl 102 bicarbonate 24 glucose 142 bun 7 cr 0,71  Mg 1.9  Pro BNP 564  High sensitive troponin < 15 and < 15  Wbc 7.8 hgb 13.1 plt 233   Chest radiograph with cardiomegaly, bilateral hilar vascular congestion, fluid in the right fissure, central bilateral interstitial infiltrates, small left pleural effusion.   EKG 132 bpm, normal axis, normal intervals, qtc 405, atrial flutter rhythm with variable block, no significant ST segment changes, negative T wave V5 and V6.   Patient was placed on IV diltiazem  and IV furosemide   01/11 rate controlled atrial flutter, pending direct current cardioversion  01/12 direct current cardioversion recovering sinus rhythm  01/13 continue with sinus rhythm, plan for outpatient follow up.   Assessment and Plan: * Acute on chronic diastolic HF (heart failure) (HCC) Echocardiogram with preserved LV systolic function with EF 55 to 60%, grade I diastolic dysfunction with impaired relaxation, RV systolic function preserved, no significant valvular disease.   Patient was placed on IV furosemide  for diuresis, negative fluid balance was achieved, -2.725 ml, with improvement in his symptoms.   Plan to continue metoprolol  succinate, and SGLT 2 inh Further RAAS inhibition as outpatient.  Diuresis with furosemide  40 mg po daily   Atrial flutter, paroxysmal (HCC) 01/12 direct current cardioversion, recovering sinus rhythm.   Patient continue sinus rhythm with occasional PAC Plan to continue AV blockade with diltiazem  and metoprolol  succinate.  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 TEE  Disposition: Home Diet recommendation:  Cardiac diet DISCHARGE MEDICATION: Allergies as of 01/04/2025   No Known Allergies      Medication List     STOP taking these medications    diphenhydrAMINE  25 mg capsule Commonly known as: BENADRYL    methocarbamol  500 MG tablet Commonly known as: ROBAXIN    metolazone  5 MG tablet Commonly known as: ZAROXOLYN    metoprolol  tartrate 25 MG tablet Commonly known as: LOPRESSOR        TAKE these medications    albuterol  (2.5 MG/3ML) 0.083% nebulizer solution Commonly known as: PROVENTIL  Take 2.5 mg by nebulization every 6 (six) hours as needed for wheezing or shortness of breath.   albuterol  108 (90 Base) MCG/ACT inhaler Commonly known as: VENTOLIN  HFA Inhale 2 puffs into the lungs every 6 (six) hours as needed for wheezing or shortness of breath.   apixaban  5 MG Tabs tablet Commonly known as: ELIQUIS  Take 1 tablet (5 mg total) by mouth 2 (two) times daily.   clobetasol  ointment 0.05 % Commonly known as: TEMOVATE  Apply 1 Application topically 2 (two) times daily.   cyclobenzaprine  10 MG tablet Commonly known as: FLEXERIL  Take 10 mg by mouth 3 (three) times daily as needed for muscle spasms.   diclofenac  Sodium 1 % Gel Commonly known as: VOLTAREN  Apply 1 g topically daily as needed (pain).   diltiazem  360 MG 24 hr capsule Commonly known as: CARDIZEM  CD Take 1 capsule (360 mg total) by mouth daily.   DULoxetine  60 MG capsule Commonly known as: CYMBALTA  Take 60 mg by mouth 2 (two) times daily.   empagliflozin  10 MG Tabs tablet Commonly known as: JARDIANCE  Take 1 tablet (10 mg total) by mouth daily. Start taking on: January 05, 2025   famotidine  20 MG tablet Commonly known as: PEPCID  Take 20 mg by mouth daily.    fluocinonide  ointment 0.05 % Commonly known as: LIDEX  Apply 1 Application topically in the morning and at bedtime.   folic acid  1 MG tablet Commonly known as: FOLVITE  Take 1 tablet by mouth daily.   furosemide  40 MG tablet Commonly known as: LASIX  Take 1 tablet (40 mg total) by mouth daily.   gabapentin  300 MG capsule Commonly known as: NEURONTIN  Take 1 capsule (300 mg total) by mouth 3 (three) times daily.   methotrexate  2.5 MG tablet Commonly known as: RHEUMATREX Take 15 mg by mouth once a week.   metoprolol  succinate 25 MG 24 hr tablet Commonly known as: TOPROL -XL Take 3 tablets (75 mg total) by mouth daily. Start taking on: January 05, 2025   naloxone 4 MG/0.1ML Liqd nasal spray kit Commonly known as: NARCAN Place 0.4 mg into the nose as needed (OD).   ondansetron  4 MG disintegrating tablet Commonly known as: ZOFRAN -ODT Take 4 mg by mouth daily as needed for nausea or vomiting.   oxyCODONE -acetaminophen  10-325 MG tablet Commonly known as: PERCOCET Take 1 tablet by mouth every 6 (six) hours as needed for pain.   polyethylene glycol 17 g packet Commonly known as: MIRALAX  / GLYCOLAX  Take 17 g by mouth daily as needed for mild constipation.   sennosides-docusate sodium  8.6-50 MG tablet Commonly  known as: SENOKOT-S Take 1 tablet by mouth daily as needed for constipation.   silver  sulfADIAZINE  1 % cream Commonly known as: SILVADENE  Apply 1 Application topically 2 (two) times daily.   triamcinolone  ointment 0.1 % Commonly known as: KENALOG  Apply 1 Application topically 2 (two) times daily.        Discharge Exam: Filed Weights   01/02/25 0604 01/03/25 0500 01/04/25 0500  Weight: 124.1 kg 123.8 kg 124.3 kg   BP 133/82 (BP Location: Right Arm)   Pulse 81   Temp (!) 96.9 F (36.1 C) (Axillary)   Resp 15   Ht 5' 9 (1.753 m)   Wt 124.3 kg   SpO2 100%   BMI 40.47 kg/m   Patient with no chest pain and no dyspnea, no palpitations, no lower extremity  edema, PND or orthopnea   Neurology awake and alert ENT with no pallor Cardiovascular with S1 and S2 present and regular with no gallops rubs or murmurs Respiratory with no rales or wheezing, no rhonchi Abdomen protuberant, soft and non tender Trace lower extremity edema, possible component of lymphedema   Condition at discharge: stable  The results of significant diagnostics from this hospitalization (including imaging, microbiology, ancillary and laboratory) are listed below for reference.   Imaging Studies: ECHO TEE Result Date: 01/03/2025    TRANSESOPHOGEAL ECHO REPORT   Patient Name:   DEUNTA BENEKE Date of Exam: 01/03/2025 Medical Rec #:  [id number]     Height:       69.0 in Accession #:    [id number]    Weight:       273.0 lb Date of Birth:  1961-04-06    BSA:          2.358 m Patient Age:    63 years      BP:           152/93 mmHg Patient Gender: M             HR:           105 bpm. Exam Location:  Inpatient Procedure: Transesophageal Echo, Cardiac Doppler and Color Doppler (Both            Spectral and Color Flow Doppler were utilized during procedure). Indications:     Atrial fibrillation  History:         Patient has prior history of Echocardiogram examinations, most                  recent 11/09/2024. CHF, Arrythmias:Atrial Flutter; Risk                  Factors:Hypertension.  Sonographer:     Philomena Daring Referring Phys:  [id number] JWHZOJ NICOLE DUKE Diagnosing Phys: Georganna Archer PROCEDURE: After discussion of the risks and benefits of a TEE, an informed consent was obtained from the patient. The transesophogeal probe was passed without difficulty through the esophogus of the patient. Imaged were obtained with the patient in a left lateral decubitus position. Sedation performed by different physician. The patient was monitored while under deep sedation. Anesthestetic sedation was provided intravenously by Anesthesiology: 207mg  of Propofol , 100mg  of Lidocaine . Image quality was  good. The  patient developed no complications during the procedure. A successful direct current cardioversion was performed at 360 joules with 1 attempt.  IMPRESSIONS  1. Left ventricular ejection fraction, by estimation, is 40 to 45%. The left ventricle has mildly decreased function. The left ventricle demonstrates global hypokinesis.  2. Prominence seen in the  RV cavity favored to be a prominent moderator band, which is a normal variant. Right ventricular systolic function is mildly reduced. The right ventricular size is normal.  3. Left atrial size was mildly dilated. No left atrial/left atrial appendage thrombus was detected.  4. Right atrial size was mildly dilated.  5. The mitral valve is normal in structure. Mild mitral valve regurgitation. No evidence of mitral stenosis.  6. The aortic valve is tricuspid. Aortic valve regurgitation is trivial. No aortic stenosis is present. Conclusion(s)/Recommendation(s): No left atrial or left atrial appendage thrombi or massess visualized. Successful cardioversion. Recommend dedicated transthoracic echocardiogram to confirm mild reduction in LV systolic function. FINDINGS  Left Ventricle: Left ventricular ejection fraction, by estimation, is 40 to 45%. The left ventricle has mildly decreased function. The left ventricle demonstrates global hypokinesis. The left ventricular internal cavity size was normal in size. Right Ventricle: Prominence seen in the RV cavity favored to be a prominent moderator band, which is a normal variant. The right ventricular size is normal. No increase in right ventricular wall thickness. Right ventricular systolic function is mildly reduced. Left Atrium: Left atrial size was mildly dilated. No left atrial/left atrial appendage thrombus was detected. Right Atrium: Right atrial size was mildly dilated. Pericardium: There is no evidence of pericardial effusion. Mitral Valve: The mitral valve is normal in structure. Mild mitral valve regurgitation. No  evidence of mitral valve stenosis. Tricuspid Valve: The tricuspid valve is normal in structure. Tricuspid valve regurgitation is mild . No evidence of tricuspid stenosis. Aortic Valve: The aortic valve is tricuspid. Aortic valve regurgitation is trivial. No aortic stenosis is present. Pulmonic Valve: The pulmonic valve was normal in structure. Pulmonic valve regurgitation is not visualized. No evidence of pulmonic stenosis. Aorta: The aortic root and ascending aorta are structurally normal, with no evidence of dilitation. IAS/Shunts: No atrial level shunt detected by color flow Doppler. Additional Comments: Spectral Doppler performed.  AORTA Ao Asc diam: 3.80 cm Georganna Archer Electronically signed by Georganna Archer Signature Date/Time: 01/03/2025/11:02:27 AM    Final    EP STUDY Result Date: 01/03/2025 See surgical note for result.  DG Chest 2 View Result Date: 12/31/2024 CLINICAL DATA:  Shortness of breath.  Nausea. EXAM: CHEST - 2 VIEW COMPARISON:  11/07/2024 FINDINGS: Stable mild cardiomegaly. Stable pleural-parenchymal scarring in left hemithorax. Right lung remains clear. IMPRESSION: Stable mild cardiomegaly and left pleural-parenchymal scarring. No acute findings. Electronically Signed   By: Norleen DELENA Kil M.D.   On: 12/31/2024 11:13   LONG TERM MONITOR (3-14 DAYS) Result Date: 12/27/2024 Patch Wear Time:  13 days and 22 hours HR 52 - 255, average 89 bpm. 1367 nonsustained SVT (longest 17 beats) and 1 nonsustained VT (longest 6 beats) No atrial fibrillation detected. Occasional supraventricular ectopy, 1.1%. Rare ventricular ectopy. No sustained arrhythmias. No symptom trigger episodes. [facility] Cardiac Electrophysiology   Microbiology: Results for orders placed or performed during the hospital encounter of 11/07/24  Culture, blood (single)     Status: None   Collection Time: 11/07/24 10:56 PM   Specimen: BLOOD  Result Value Ref Range Status   Specimen Description   Final    BLOOD  RIGHT ANTECUBITAL Performed at [facility], [address] [place], [place] [address]    Special Requests   Final    BOTTLES DRAWN AEROBIC AND ANAEROBIC Blood Culture adequate volume Performed at [facility], [address] [place], [place] [address]    Culture   Final    NO GROWTH  5 DAYS Performed at [facility], [address] [place], [place] [address]    Report Status 11/13/2024 FINAL  Final  Expectorated Sputum Assessment w Gram Stain, Rflx to Resp Cult     Status: None   Collection Time: 11/08/24  9:15 PM   Specimen: Expectorated Sputum  Result Value Ref Range Status   Specimen Description EXPECTORATED SPUTUM  Final   Special Requests NONE  Final   Sputum evaluation   Final    THIS SPECIMEN IS ACCEPTABLE FOR SPUTUM CULTURE Performed at [facility], [address], [place], [place] [address]    Report Status 11/08/2024 FINAL  Final  Culture, Respiratory w Gram Stain     Status: None   Collection Time: 11/08/24  9:15 PM  Result Value Ref Range Status   Specimen Description   Final    EXPECTORATED SPUTUM Performed at [facility], [address], [place], [place] [address]    Special Requests   Final    NONE Reflexed from [id number] Performed at [facility], [address], [place], [place] [address]    Gram Stain   Final    FEW WBC PRESENT,BOTH PMN AND MONONUCLEAR NO ORGANISMS SEEN    Culture   Final    RARE Normal respiratory flora-no Staph aureus or Pseudomonas seen Performed at [facility], [address], [place], [place] [address]    Report Status 11/11/2024 FINAL  Final    Labs: CBC: Recent Labs  Lab 12/31/24 1030 01/01/25 0322 01/03/25 0257  WBC 7.8 8.9 8.5  HGB 13.1 13.2 12.6*  HCT 41.1 41.8 39.7  MCV 96.7 95.9 95.4  PLT 233 246 234   Basic Metabolic Panel: Recent Labs  Lab 12/31/24 1030 12/31/24 1451 01/01/25 0322 01/02/25 0322  01/03/25 0257 01/04/25 0301  NA 137  --  138 138 138 138  K 4.1  --  4.4 3.7 4.0 3.6  CL 102  --  100 100 100 100  CO2 24  --  28 27 27 27   GLUCOSE 142*  --  119* 128* 108* 132*  BUN 7*  --  9 14 16 20   CREATININE 0.71  --  0.75 0.87 0.85 0.89  CALCIUM 9.2  --  9.4 8.9 9.2 9.1  MG  --  1.9 2.0 1.8  --  2.0   Liver Function Tests: No results for input(s): AST, ALT, ALKPHOS, BILITOT, PROT, ALBUMIN in the last 168 hours. CBG: No results for input(s): GLUCAP in the last 168 hours.  Discharge time spent: greater than 30 minutes.  Signed: Elidia Toribio Furnace, MD [facility] 01/04/2025 "

## 2025-01-05 NOTE — Progress Notes (Addendum)
 "  Primary Care Physician: [name], MD Primary Cardiologist: Soyla DELENA Merck, MD Electrophysiologist: None  Referring Physician: ED   Willie Schmidt is a 64 y.o. male with a history of paroxysmal AF, stage IV metastatic lung CA followed by [facility], HTN, chronic bronchitis , who presents for follow up in the [facility].    He was seen in follow-up on 12/31/2024 and found to be in AF with RVR and very symptomatic.  He was referred to the ED for further evaluation and reported some missed doses of Eliquis  and required TEE/DCCV.  He was placed on Cardizem  drip as well as IV Lasix  for volume overload and underwent successful procedure on 01/03/2025.  EF was noted to be mildly reduced at 40-45% with recommendation to complete dedicated  TTE in the future.  He was discharged in sinus rhythm and continued on Cardizem  and metoprolol  succinate.  Willie Schmidt presents today for posthospital and DCCV follow-up.  On exam today patient is in sinus rhythm with rate elevated at 98 bpm. He reports no episodes of his heart racing since his hospitalization.  He has been compliant with his Eliquis  and denies any missed doses today.  We discussed strategies for rate and rhythm control and we will pursue initiation of antiarrhythmic medication as well as referral to EP to discuss candidacy for ablation.  He was advised and informed of the ED precautions in the event that he becomes symptomatic with racing heart rate.  Today, he denies symptoms of palpitations, chest pain, shortness of breath, orthopnea, PND, lower extremity edema, dizziness, presyncope, syncope, snoring, daytime somnolence, bleeding, or neurologic sequela. The patient is tolerating medications without difficulties and is otherwise without complaint today.   Atrial Fibrillation Management history: Previous antiarrhythmic drugs: None Previous cardioversions: None Previous ablations: None Anticoagulation history:  Eliquis   ROS- All systems are reviewed and negative except as per the HPI above.  Past Medical History:  Diagnosis Date   Bronchitis, chronic (HCC)    Hypertension    metastatic lung ca dx'd 02/2019   lyphadenopathy, bil pulmonary noduels; suspected bone lesions    Current Outpatient Medications  Medication Sig Dispense Refill   albuterol  (PROVENTIL ) (2.5 MG/3ML) 0.083% nebulizer solution Take 2.5 mg by nebulization every 6 (six) hours as needed for wheezing or shortness of breath.     albuterol  (VENTOLIN  HFA) 108 (90 Base) MCG/ACT inhaler Inhale 2 puffs into the lungs every 6 (six) hours as needed for wheezing or shortness of breath. 1 each 2   apixaban  (ELIQUIS ) 5 MG TABS tablet Take 1 tablet (5 mg total) by mouth 2 (two) times daily. 60 tablet 0   clobetasol  ointment (TEMOVATE ) 0.05 % Apply 1 Application topically 2 (two) times daily.     cyclobenzaprine  (FLEXERIL ) 10 MG tablet Take 10 mg by mouth 3 (three) times daily as needed for muscle spasms.     diclofenac  Sodium (VOLTAREN ) 1 % GEL Apply 1 g topically daily as needed (pain).     diltiazem  (CARDIZEM  CD) 360 MG 24 hr capsule Take 1 capsule (360 mg total) by mouth daily. 30 capsule 0   dronedarone  (MULTAQ ) 400 MG tablet Take 1 tablet (400 mg total) by mouth 2 (two) times daily with a meal. 60 tablet 6   DULoxetine  (CYMBALTA ) 60 MG capsule Take 60 mg by mouth 2 (two) times daily.     empagliflozin  (JARDIANCE ) 10 MG TABS tablet Take 1 tablet (10 mg total) by mouth daily. 30 tablet 0  famotidine  (PEPCID ) 20 MG tablet Take 20 mg by mouth daily.     fluocinonide  ointment (LIDEX ) 0.05 % Apply 1 Application topically in the morning and at bedtime.     folic acid  (FOLVITE ) 1 MG tablet Take 1 tablet by mouth daily.     furosemide  (LASIX ) 40 MG tablet Take 1 tablet (40 mg total) by mouth daily. 30 tablet 0   gabapentin  (NEURONTIN ) 300 MG capsule Take 1 capsule (300 mg total) by mouth 3 (three) times daily. 90 capsule 3   methotrexate   (RHEUMATREX) 2.5 MG tablet Take 15 mg by mouth once a week.     metoprolol  succinate (TOPROL -XL) 25 MG 24 hr tablet Take 3 tablets (75 mg total) by mouth daily. 90 tablet 0   naloxone (NARCAN) nasal spray 4 mg/0.1 mL Place 0.4 mg into the nose as needed (OD).     ondansetron  (ZOFRAN -ODT) 4 MG disintegrating tablet Take 4 mg by mouth daily as needed for nausea or vomiting.     oxyCODONE -acetaminophen  (PERCOCET) 10-325 MG tablet Take 1 tablet by mouth every 6 (six) hours as needed for pain.     polyethylene glycol (MIRALAX  / GLYCOLAX ) 17 g packet Take 17 g by mouth daily as needed for mild constipation.     sennosides-docusate sodium  (SENOKOT-S) 8.6-50 MG tablet Take 1 tablet by mouth daily as needed for constipation.     silver  sulfADIAZINE  (SILVADENE ) 1 % cream Apply 1 Application topically 2 (two) times daily.     triamcinolone  ointment (KENALOG ) 0.1 % Apply 1 Application topically 2 (two) times daily.     No current facility-administered medications for this encounter.    Physical Exam: BP 110/82   Pulse 98   Ht 5' 9 (1.753 m)   Wt 125 kg   BMI 40.70 kg/m   GEN: Well nourished, well developed in no acute distress NECK: No JVD; No carotid bruits CARDIAC: Regular rate and rhythm with occasional ectopy, no murmurs, rubs, gallops RESPIRATORY:  Clear to auscultation without rales, wheezing or rhonchi  ABDOMEN: Soft, non-tender, non-distended EXTREMITIES:  No edema; No deformity   Wt Readings from Last 3 Encounters:  01/06/25 125 kg  01/04/25 124.3 kg  12/31/24 128 kg     EKG today demonstrates:   EKG Interpretation Date/Time:  Thursday January 06 2025 09:40:29 EST Ventricular Rate:  98 PR Interval:  208 QRS Duration:  86 QT Interval:  380 QTC Calculation: 485 R Axis:   31  Text Interpretation: Sinus rhythm with marked sinus arrhythmia Nonspecific ST and T wave abnormality Abnormal ECG When compared with ECG of 03-Jan-2025 10:19, Premature ventricular complexes are no longer  Present Premature atrial complexes are no longer Present Confirmed by Wyn Manus [phone]) on 01/06/2025 9:42:19 AM        Echo Completed 01/03/2025:  1. Left ventricular ejection fraction, by estimation, is 40 to 45%. The  left ventricle has mildly decreased function. The left ventricle  demonstrates global hypokinesis.   2. Prominence seen in the RV cavity favored to be a prominent moderator  band, which is a normal variant. Right ventricular systolic function is  mildly reduced. The right ventricular size is normal.   3. Left atrial size was mildly dilated. No left atrial/left atrial  appendage thrombus was detected.   4. Right atrial size was mildly dilated.   5. The mitral valve is normal in structure. Mild mitral valve  regurgitation. No evidence of mitral stenosis.   6. The aortic valve is tricuspid. Aortic valve regurgitation  is trivial.  No aortic stenosis is present.   CHA2DS2-VASc Score = 2  The patient's score is based upon: CHF History: 1 HTN History: 1 Diabetes History: 0 Stroke History: 0 Vascular Disease History: 0 Age Score: 0 Gender Score: 0      ASSESSMENT AND PLAN: Paroxysmal Atrial Fibrillation (ICD10:  I48.0) The patient's CHA2DS2-VASc score is 2, indicating a 2.2% annual risk of stroke.   -s/p TEE/DCCV due to symptomatic atrial flutter with RVR and today patient is maintaining sinus rhythm with PACs. -Discussed rate and rhythm control options and will pursue initiation of antiarrhythmics along with referral today. -Start Multaq  400 mg twice daily and will repeat EKG in 7 days -Ambulatory referral to EP to discuss ablation - Discussed the importance of med compliance moving forward Continue Eliquis  5 mg twice daily Continue Cardizem  260 mg daily and-Toprol -XL 75 mg daily  HTN: BP well controlled. Continue current antihypertensive regimen.   Secondary Hypercoagulable State (ICD10:  D68.69) The patient is at significant risk for stroke/thromboembolism  based upon his CHA2DS2-VASc Score of 2.  Continue Apixaban  (Eliquis ).   Stage IV lung CA: - Followed by [facility] with plans for intermittent imaging and no current treatment indicated at this time  Signed,  Wyn Raddle, Jackee Shove, NP    01/06/2025 10:07 AM    Follow up with the AF Clinic in 1 month  "

## 2025-01-06 ENCOUNTER — Telehealth: Payer: Self-pay | Admitting: Pharmacy Technician

## 2025-01-06 ENCOUNTER — Other Ambulatory Visit (HOSPITAL_COMMUNITY): Payer: Self-pay

## 2025-01-06 ENCOUNTER — Inpatient Hospital Stay (INDEPENDENT_AMBULATORY_CARE_PROVIDER_SITE_OTHER)
Admission: RE | Admit: 2025-01-06 | Discharge: 2025-01-06 | Payer: MEDICAID | Attending: Nurse Practitioner | Admitting: Nurse Practitioner

## 2025-01-06 ENCOUNTER — Encounter (HOSPITAL_COMMUNITY): Payer: Self-pay | Admitting: Nurse Practitioner

## 2025-01-06 VITALS — BP 110/82 | HR 98 | Ht 69.0 in | Wt 275.6 lb

## 2025-01-06 DIAGNOSIS — C3490 Malignant neoplasm of unspecified part of unspecified bronchus or lung: Secondary | ICD-10-CM | POA: Diagnosis not present

## 2025-01-06 DIAGNOSIS — I4819 Other persistent atrial fibrillation: Secondary | ICD-10-CM

## 2025-01-06 DIAGNOSIS — I480 Paroxysmal atrial fibrillation: Secondary | ICD-10-CM | POA: Diagnosis not present

## 2025-01-06 DIAGNOSIS — I4891 Unspecified atrial fibrillation: Secondary | ICD-10-CM | POA: Insufficient documentation

## 2025-01-06 DIAGNOSIS — D6859 Other primary thrombophilia: Secondary | ICD-10-CM | POA: Diagnosis not present

## 2025-01-06 MED ORDER — DRONEDARONE HCL 400 MG PO TABS: 400.0000 mg | ORAL_TABLET | Freq: Two times a day (BID) | ORAL | 6 refills | Status: DC

## 2025-01-06 NOTE — Telephone Encounter (Signed)
" ° °  Pharmacy Patient Advocate Encounter   Received notification from [facility] KEY that prior authorization for MULTAQ  is required/requested.   Insurance verification completed.   The patient is insured through [facility] MEDICAID.   Per test claim: PA required; PA submitted to above mentioned insurance via Latent Key/confirmation #/EOC [facility] Status is pending  "

## 2025-01-06 NOTE — Telephone Encounter (Signed)
 Pharmacy Patient Advocate Encounter  Received notification from [facility] MEDICAID that Prior Authorization for multaq  has been DENIED.  See denial reason below. No denial letter attached in CMM. Will attach denial letter to Media tab once received.   PA #/Case ID/Reference #: [id number]   Requires step therapy with two preferred antiarrhythmic drugs: amiodarone , disopyramide, dofetilide, flecainide, mexiletine, propafenone, propafenone SR, or quinidine sulfate.    The insurance is requiring the patient to try two of the medications above, can one of those medications be sent into his pharmacy instead? Thank you

## 2025-01-06 NOTE — Patient Instructions (Addendum)
"     Start dronedarone  (MULTAQ ) 400 MG twice a day with food  "

## 2025-01-07 ENCOUNTER — Other Ambulatory Visit (HOSPITAL_COMMUNITY): Payer: Self-pay | Admitting: *Deleted

## 2025-01-07 ENCOUNTER — Encounter (HOSPITAL_COMMUNITY): Payer: Self-pay | Admitting: *Deleted

## 2025-01-07 MED ORDER — AMIODARONE HCL 200 MG PO TABS: ORAL_TABLET | ORAL | 3 refills | Status: DC

## 2025-01-07 NOTE — Telephone Encounter (Signed)
 Per Daril Kicks PA will prescribe Amiodarone  200mg  twice a day for 14 days then reduce to 200mg  once a day.  Patient to have EKG at upcoming cardiology visit in 2 weeks with Damien Braver to assess EKG intervals on Amiodarone . RX sent.

## 2025-01-13 ENCOUNTER — Ambulatory Visit (HOSPITAL_COMMUNITY)
Admission: RE | Admit: 2025-01-13 | Discharge: 2025-01-13 | Disposition: A | Discharge status: Home / Self Care | Payer: MEDICAID | Source: Ambulatory Visit | Attending: Internal Medicine | Admitting: Internal Medicine

## 2025-01-13 ENCOUNTER — Encounter (HOSPITAL_COMMUNITY): Payer: Self-pay | Admitting: Internal Medicine

## 2025-01-13 ENCOUNTER — Ambulatory Visit (HOSPITAL_COMMUNITY): Payer: MEDICAID | Admitting: Internal Medicine

## 2025-01-13 VITALS — BP 114/78 | HR 104 | Ht 69.0 in | Wt 274.4 lb

## 2025-01-13 DIAGNOSIS — I4819 Other persistent atrial fibrillation: Secondary | ICD-10-CM

## 2025-01-13 DIAGNOSIS — Z79899 Other long term (current) drug therapy: Secondary | ICD-10-CM | POA: Diagnosis not present

## 2025-01-13 DIAGNOSIS — Z5181 Encounter for therapeutic drug level monitoring: Secondary | ICD-10-CM

## 2025-01-13 DIAGNOSIS — D6859 Other primary thrombophilia: Secondary | ICD-10-CM

## 2025-01-13 MED ORDER — METOPROLOL SUCCINATE ER 100 MG PO TB24: 100.0000 mg | ORAL_TABLET | Freq: Every day | ORAL | 3 refills | Status: AC

## 2025-01-13 MED ORDER — AMIODARONE HCL 200 MG PO TABS: ORAL_TABLET | ORAL | 3 refills | Status: AC

## 2025-01-13 NOTE — Addendum Note (Signed)
 Encounter addended by: Terra Fairy PARAS, PA-C on: 01/13/2025 2:33 PM  Actions taken: Clinical Note Signed

## 2025-01-13 NOTE — Patient Instructions (Addendum)
 Increase metoprolol  succinate (TOPROL -XL) 100 MG once daily   Start Amiodarone  200 mg twice a day until 02/04/25 then decrease to 200 mg once a day   May use monitor device [facility] - 1 lead

## 2025-01-13 NOTE — Progress Notes (Addendum)
 "  Primary Care Physician: [name], MD Primary Cardiologist: Soyla DELENA Merck, MD Electrophysiologist: None  Referring Physician: ED   Willie Schmidt is a 64 y.o. male with a history of paroxysmal AF, stage IV metastatic lung CA followed by [facility], HTN, chronic bronchitis , who presents for follow up in the [facility].    He was seen in follow-up on 12/31/2024 and found to be in AF with RVR and very symptomatic.  He was referred to the ED for further evaluation and reported some missed doses of Eliquis  and required TEE/DCCV.  He was placed on Cardizem  drip as well as IV Lasix  for volume overload and underwent successful procedure on 01/03/2025.  EF was noted to be mildly reduced at 40-45% with recommendation to complete dedicated  TTE in the future.  He was discharged in sinus rhythm and continued on Cardizem  and metoprolol  succinate.  Willie Schmidt presents today for posthospital and DCCV follow-up.  On exam today patient is in sinus rhythm with rate elevated at 98 bpm. He reports no episodes of his heart racing since his hospitalization.  He has been compliant with his Eliquis  and denies any missed doses today.  We discussed strategies for rate and rhythm control and we will pursue initiation of antiarrhythmic medication as well as referral to EP to discuss candidacy for ablation.  He was advised and informed of the ED precautions in the event that he becomes symptomatic with racing heart rate.  Follow-up 01/13/2025.  Patient is currently in Afib with RVR.  Daughter contacted office this morning noted patient had a bad night with elevated heart rate.  He is currently taking amiodarone  200 mg twice daily for 14 days then transition to 200 mg once daily.  He is taking diltiazem  360 mg daily and Toprol  75 mg daily.  He is taking Lasix  40 mg daily.  No missed doses of Eliquis . His weight is similar to that on 1/15 visit and he notes improvement in terms of his edema.    Today, he denies symptoms of palpitations, chest pain, shortness of breath, orthopnea, PND, lower extremity edema, dizziness, presyncope, syncope, snoring, daytime somnolence, bleeding, or neurologic sequela. The patient is tolerating medications without difficulties and is otherwise without complaint today.   Atrial Fibrillation Management history: Previous antiarrhythmic drugs: Amiodarone  Previous cardioversions: 01/03/25 Previous ablations: None Anticoagulation history: Eliquis   ROS- All systems are reviewed and negative except as per the HPI above.  Past Medical History:  Diagnosis Date   Bronchitis, chronic (HCC)    Hypertension    metastatic lung ca dx'd 02/2019   lyphadenopathy, bil pulmonary noduels; suspected bone lesions    Current Outpatient Medications  Medication Sig Dispense Refill   albuterol  (PROVENTIL ) (2.5 MG/3ML) 0.083% nebulizer solution Take 2.5 mg by nebulization every 6 (six) hours as needed for wheezing or shortness of breath.     albuterol  (VENTOLIN  HFA) 108 (90 Base) MCG/ACT inhaler Inhale 2 puffs into the lungs every 6 (six) hours as needed for wheezing or shortness of breath. 1 each 2   apixaban  (ELIQUIS ) 5 MG TABS tablet Take 1 tablet (5 mg total) by mouth 2 (two) times daily. 60 tablet 0   clobetasol  ointment (TEMOVATE ) 0.05 % Apply 1 Application topically 2 (two) times daily.     cyclobenzaprine  (FLEXERIL ) 10 MG tablet Take 10 mg by mouth 3 (three) times daily as needed for muscle spasms.     diclofenac  Sodium (VOLTAREN ) 1 % GEL Apply 1 g  topically daily as needed (pain).     diltiazem  (CARDIZEM  CD) 360 MG 24 hr capsule Take 1 capsule (360 mg total) by mouth daily. 30 capsule 0   DULoxetine  (CYMBALTA ) 60 MG capsule Take 60 mg by mouth 2 (two) times daily.     empagliflozin  (JARDIANCE ) 10 MG TABS tablet Take 1 tablet (10 mg total) by mouth daily. 30 tablet 0   famotidine  (PEPCID ) 20 MG tablet Take 20 mg by mouth daily.     fluocinonide  ointment (LIDEX )  0.05 % Apply 1 Application topically in the morning and at bedtime.     folic acid  (FOLVITE ) 1 MG tablet Take 1 tablet by mouth daily.     furosemide  (LASIX ) 40 MG tablet Take 1 tablet (40 mg total) by mouth daily. 30 tablet 0   gabapentin  (NEURONTIN ) 300 MG capsule Take 1 capsule (300 mg total) by mouth 3 (three) times daily. 90 capsule 3   methotrexate  (RHEUMATREX) 2.5 MG tablet Take 15 mg by mouth once a week.     naloxone (NARCAN) nasal spray 4 mg/0.1 mL Place 0.4 mg into the nose as needed (OD).     ondansetron  (ZOFRAN -ODT) 4 MG disintegrating tablet Take 4 mg by mouth daily as needed for nausea or vomiting.     oxyCODONE -acetaminophen  (PERCOCET) 10-325 MG tablet Take 1 tablet by mouth every 6 (six) hours as needed for pain.     polyethylene glycol (MIRALAX  / GLYCOLAX ) 17 g packet Take 17 g by mouth daily as needed for mild constipation.     sennosides-docusate sodium  (SENOKOT-S) 8.6-50 MG tablet Take 1 tablet by mouth daily as needed for constipation.     silver  sulfADIAZINE  (SILVADENE ) 1 % cream Apply 1 Application topically 2 (two) times daily.     triamcinolone  ointment (KENALOG ) 0.1 % Apply 1 Application topically 2 (two) times daily.     amiodarone  (PACERONE ) 200 MG tablet Take 1 tablet (200 mg total) by mouth 2 (two) times daily for 23 days, THEN 1 tablet (200 mg total) daily. 60 tablet 3   metoprolol  succinate (TOPROL -XL) 100 MG 24 hr tablet Take 1 tablet (100 mg total) by mouth daily. 60 tablet 3   No current facility-administered medications for this encounter.    Physical Exam: BP 114/78   Pulse (!) 104   Ht 5' 9 (1.753 m)   Wt 124.5 kg   SpO2 95%   BMI 40.52 kg/m   GEN- The patient is well appearing, alert and oriented x 3 today.   Neck - no JVD or carotid bruit noted Lungs- Clear to ausculation bilaterally, normal work of breathing Heart- Irregular rate and rhythm, no murmurs, rubs or gallops, PMI not laterally displaced Extremities- no clubbing, cyanosis, or  edema Skin - no rash or ecchymosis noted   Wt Readings from Last 3 Encounters:  01/13/25 124.5 kg  01/06/25 125 kg  01/04/25 124.3 kg     EKG today demonstrates:   EKG Interpretation Date/Time:  Thursday January 13 2025 13:38:33 EST Ventricular Rate:  104 PR Interval:    QRS Duration:  86 QT Interval:  396 QTC Calculation: 520 R Axis:   32  Text Interpretation: Atrial fibrillation with rapid ventricular response Nonspecific ST and T wave abnormality Prolonged QT Abnormal ECG When compared with ECG of 06-Jan-2025 09:40, Atrial fibrillation has replaced Sinus rhythm Confirmed by Terra Pac ([id number]) on 01/13/2025 1:39:53 PM        Echo Completed 01/03/2025:  1. Left ventricular ejection fraction, by estimation, is 40  to 45%. The  left ventricle has mildly decreased function. The left ventricle  demonstrates global hypokinesis.   2. Prominence seen in the RV cavity favored to be a prominent moderator  band, which is a normal variant. Right ventricular systolic function is  mildly reduced. The right ventricular size is normal.   3. Left atrial size was mildly dilated. No left atrial/left atrial  appendage thrombus was detected.   4. Right atrial size was mildly dilated.   5. The mitral valve is normal in structure. Mild mitral valve  regurgitation. No evidence of mitral stenosis.   6. The aortic valve is tricuspid. Aortic valve regurgitation is trivial.  No aortic stenosis is present.   CHA2DS2-VASc Score = 2  The patient's score is based upon: CHF History: 1 HTN History: 1 Diabetes History: 0 Stroke History: 0 Vascular Disease History: 0 Age Score: 0 Gender Score: 0      ASSESSMENT AND PLAN: Paroxysmal Atrial Fibrillation (ICD10:  I48.0) The patient's CHA2DS2-VASc score is 2, indicating a 2.2% annual risk of stroke.   S/p TEE/DCCV on 01/03/2025.  Patient is currently in A-fib.  He was initially started on Multaq  but this was not accepted by insurance so patient was  transitioned to amiodarone .  He began amiodarone  load 200 mg twice daily x 14 days.  Due to patient being back out of rhythm compared to visit on 1/15, will lengthen patient's amiodarone  load to 200 mg twice daily x 30 days then changed to 200 mg once daily.  We will increase his Toprol  to 100 mg daily.  I will cautiously continue diltiazem  at this time given his mildly reduced heart function noted on TEE.  I will have patient continue his current diuresis regimen as he notes improvement and he has not had any weight gain compared to last office visit.  I do note a class C interaction between methotrexate  and amiodarone ; this will have to be followed closely.  Patient's daughter asked about EP referral; it was already made at last office visit. He appears to require both AV nodal agents currently to help decrease tachycardia; after patient is back in normal rhythm may benefit from transition away from diltiazem .   High risk medication monitoring (ICD10: Z79.899) Patient requires ongoing monitoring for anti-arrhythmic medication which has the potential to cause life threatening arrhythmias or AV block. Qtc stable. Continue amiodarone  200 mg BID for a total of 30 days then transition to 200 mg once daily.   HTN: Stable today.  Secondary Hypercoagulable State (ICD10:  D68.69) The patient is at significant risk for stroke/thromboembolism based upon his CHA2DS2-VASc Score of 2.  Continue Apixaban  (Eliquis ).  No missed doses of Eliquis .  Stage IV lung CA: - Followed by [facility] with plans for intermittent imaging and no current treatment indicated at this time   Follow up 2 weeks to determine if needs repeat cardioversion.   Dorn Heinrich, PA-C Afib clinic [address] [phone]  "

## 2025-01-14 ENCOUNTER — Ambulatory Visit (HOSPITAL_COMMUNITY): Payer: MEDICAID | Admitting: Nurse Practitioner

## 2025-01-18 NOTE — Progress Notes (Unsigned)
 error

## 2025-01-19 ENCOUNTER — Ambulatory Visit: Payer: MEDICAID

## 2025-01-26 NOTE — Progress Notes (Incomplete)
 "  Primary Care Physician: Willie Lung, MD Primary Cardiologist: Willie DELENA Merck, MD Electrophysiologist: None  Referring Physician: ED   Willie Schmidt is a 64 y.o. male with a history of paroxysmal AF, stage IV metastatic Schmidt CA followed by [facility], HTN, chronic bronchitis , who presents for follow up in the [facility].    Willie Schmidt was last seen for post cardioversion follow-up on 01/13/2025 and was noted to be in AF with RVR.  During visit he was finishing up an amiodarone  load and reported no missed doses of his Eliquis .  He was advised to lengthen his amiodarone  load to 30 days with dose of 200 mg twice daily.  He also had Toprol -XL increased to 100 mg daily.  He presents today for follow-up.   Today, he denies symptoms of ***palpitations, chest pain, shortness of breath, orthopnea, PND, lower extremity edema, dizziness, presyncope, syncope, snoring, daytime somnolence, bleeding, or neurologic sequela. The patient is tolerating medications without difficulties and is otherwise without complaint today.   ***Discussed the use of AI scribe software for clinical note transcription with the patient, who gave verbal consent to proceed.  During visit he was maintaining sinus rhythm and discussion was made regarding rate and rhythm control.  Notes: ***  Atrial Fibrillation Management history: Previous antiarrhythmic drugs: Amiodarone  Previous cardioversions: None Previous ablations: None Anticoagulation history: Eliquis  ROS- All systems are reviewed and negative except as per the HPI above.  Past Medical History:  Diagnosis Date   Bronchitis, chronic (HCC)    Hypertension    metastatic Schmidt ca dx'd 02/2019   lyphadenopathy, bil pulmonary noduels; suspected bone lesions   Past Surgical History:  Procedure Laterality Date   CARDIOVERSION N/A 01/03/2025   Procedure: CARDIOVERSION;  Surgeon: Willie Mallard, MD;  Location: [facility] INVASIVE CV LAB;   Service: Cardiovascular;  Laterality: N/A;   TRANSESOPHAGEAL ECHOCARDIOGRAM (CATH LAB) N/A 01/03/2025   Procedure: TRANSESOPHAGEAL ECHOCARDIOGRAM;  Surgeon: Willie Mallard, MD;  Location: [facility] INVASIVE CV LAB;  Service: Cardiovascular;  Laterality: N/A;   Patient has no known allergies. Current Outpatient Medications  Medication Sig Dispense Refill   albuterol  (PROVENTIL ) (2.5 MG/3ML) 0.083% nebulizer solution Take 2.5 mg by nebulization every 6 (six) hours as needed for wheezing or shortness of breath.     albuterol  (VENTOLIN  HFA) 108 (90 Base) MCG/ACT inhaler Inhale 2 puffs into the lungs every 6 (six) hours as needed for wheezing or shortness of breath. 1 each 2   amiodarone  (PACERONE ) 200 MG tablet Take 1 tablet (200 mg total) by mouth 2 (two) times daily for 23 days, THEN 1 tablet (200 mg total) daily. 60 tablet 3   apixaban  (ELIQUIS ) 5 MG TABS tablet Take 1 tablet (5 mg total) by mouth 2 (two) times daily. 60 tablet 0   clobetasol  ointment (TEMOVATE ) 0.05 % Apply 1 Application topically 2 (two) times daily.     cyclobenzaprine  (FLEXERIL ) 10 MG tablet Take 10 mg by mouth 3 (three) times daily as needed for muscle spasms.     diclofenac  Sodium (VOLTAREN ) 1 % GEL Apply 1 g topically daily as needed (pain).     diltiazem  (CARDIZEM  CD) 360 MG 24 hr capsule Take 1 capsule (360 mg total) by mouth daily. 30 capsule 0   DULoxetine  (CYMBALTA ) 60 MG capsule Take 60 mg by mouth 2 (two) times daily.     empagliflozin  (JARDIANCE ) 10 MG TABS tablet Take 1 tablet (10 mg total) by mouth daily. 30 tablet 0   famotidine  (  PEPCID ) 20 MG tablet Take 20 mg by mouth daily.     fluocinonide  ointment (LIDEX ) 0.05 % Apply 1 Application topically in the morning and at bedtime.     folic acid  (FOLVITE ) 1 MG tablet Take 1 tablet by mouth daily.     furosemide  (LASIX ) 40 MG tablet Take 1 tablet (40 mg total) by mouth daily. 30 tablet 0   gabapentin  (NEURONTIN ) 300 MG capsule Take 1 capsule (300 mg total) by mouth 3  (three) times daily. 90 capsule 3   methotrexate  (RHEUMATREX) 2.5 MG tablet Take 15 mg by mouth once a week.     metoprolol  succinate (TOPROL -XL) 100 MG 24 hr tablet Take 1 tablet (100 mg total) by mouth daily. 60 tablet 3   naloxone (NARCAN) nasal spray 4 mg/0.1 mL Place 0.4 mg into the nose as needed (OD).     ondansetron  (ZOFRAN -ODT) 4 MG disintegrating tablet Take 4 mg by mouth daily as needed for nausea or vomiting.     oxyCODONE -acetaminophen  (PERCOCET) 10-325 MG tablet Take 1 tablet by mouth every 6 (six) hours as needed for pain.     polyethylene glycol (MIRALAX  / GLYCOLAX ) 17 g packet Take 17 g by mouth daily as needed for mild constipation.     sennosides-docusate sodium  (SENOKOT-S) 8.6-50 MG tablet Take 1 tablet by mouth daily as needed for constipation.     silver  sulfADIAZINE  (SILVADENE ) 1 % cream Apply 1 Application topically 2 (two) times daily.     triamcinolone  ointment (KENALOG ) 0.1 % Apply 1 Application topically 2 (two) times daily.     No current facility-administered medications for this visit.    Physical Exam: There were no vitals taken for this visit.  GEN: Well nourished, well developed in no acute distress NECK: No JVD; No carotid bruits CARDIAC: {EPRHYTHM:28826}, no murmurs, rubs, gallops RESPIRATORY:  Clear to auscultation without rales, wheezing or rhonchi  ABDOMEN: Soft, non-tender, non-distended EXTREMITIES:  No edema; No deformity   Wt Readings from Last 3 Encounters:  01/13/25 124.5 kg  01/06/25 125 kg  01/04/25 124.3 kg    Lab Results  Component Value Date   TSH 1.010 01/01/2025   EKG today demonstrates:   EKG Interpretation Date/Time:    Ventricular Rate:    PR Interval:    QRS Duration:    QT Interval:    QTC Calculation:   R Axis:      Text Interpretation:          Echo Completed ***  CHA2DS2-VASc Score = 2  The patient's score is based upon: CHF History: 1 HTN History: 1 Diabetes History: 0 Stroke History: 0 Vascular  Disease History: 0 Age Score: 0 Gender Score: 0   {Confirm score is correct.  If not, click here to update score.  REFRESH note.  :1}    ASSESSMENT AND PLAN: {Select the correct AFib Diagnosis                 :[id number]}   Signed,  Wyn Raddle, Jackee Shove, NP    01/26/2025 1:15 PM     {Are you ordering a CV Procedure (e.g. stress test, cath, DCCV, TEE, etc)?   Press F2        :[id number]}  Do not forget to refresh clinic note for EKG  Follow up with the AF Clinic in ***  {Click to Open Review  :1}  "

## 2025-01-27 ENCOUNTER — Ambulatory Visit (HOSPITAL_COMMUNITY): Payer: MEDICAID | Admitting: Nurse Practitioner

## 2025-02-04 ENCOUNTER — Ambulatory Visit (HOSPITAL_COMMUNITY): Payer: MEDICAID | Admitting: Nurse Practitioner

## 2025-02-28 ENCOUNTER — Ambulatory Visit: Payer: MEDICAID | Admitting: Cardiology

## 2025-03-02 ENCOUNTER — Ambulatory Visit: Payer: MEDICAID | Admitting: Emergency Medicine
